# Patient Record
Sex: Female | Born: 1970 | Race: White | Hispanic: No | Marital: Married | Smoking: Former smoker
Health system: Southern US, Community
[De-identification: ages and names within clinical notes are randomized; demographics above are authoritative.]

## PROBLEM LIST (undated history)

## (undated) DIAGNOSIS — N926 Irregular menstruation, unspecified: Secondary | ICD-10-CM

## (undated) DIAGNOSIS — N61 Mastitis without abscess: Secondary | ICD-10-CM

## (undated) DIAGNOSIS — T4145XA Adverse effect of unspecified anesthetic, initial encounter: Secondary | ICD-10-CM

## (undated) DIAGNOSIS — M549 Dorsalgia, unspecified: Secondary | ICD-10-CM

## (undated) DIAGNOSIS — E739 Lactose intolerance, unspecified: Secondary | ICD-10-CM

## (undated) DIAGNOSIS — C801 Malignant (primary) neoplasm, unspecified: Secondary | ICD-10-CM

## (undated) DIAGNOSIS — O209 Hemorrhage in early pregnancy, unspecified: Secondary | ICD-10-CM

## (undated) DIAGNOSIS — R011 Cardiac murmur, unspecified: Secondary | ICD-10-CM

## (undated) DIAGNOSIS — Z22330 Carrier of Group B streptococcus: Secondary | ICD-10-CM

## (undated) DIAGNOSIS — Z803 Family history of malignant neoplasm of breast: Secondary | ICD-10-CM

## (undated) DIAGNOSIS — T7840XA Allergy, unspecified, initial encounter: Secondary | ICD-10-CM

## (undated) DIAGNOSIS — K589 Irritable bowel syndrome without diarrhea: Secondary | ICD-10-CM

## (undated) DIAGNOSIS — N809 Endometriosis, unspecified: Secondary | ICD-10-CM

## (undated) DIAGNOSIS — Z6791 Unspecified blood type, Rh negative: Secondary | ICD-10-CM

## (undated) DIAGNOSIS — Z8619 Personal history of other infectious and parasitic diseases: Secondary | ICD-10-CM

## (undated) DIAGNOSIS — M797 Fibromyalgia: Secondary | ICD-10-CM

## (undated) DIAGNOSIS — R600 Localized edema: Secondary | ICD-10-CM

## (undated) DIAGNOSIS — M26629 Arthralgia of temporomandibular joint, unspecified side: Secondary | ICD-10-CM

## (undated) DIAGNOSIS — Z8041 Family history of malignant neoplasm of ovary: Secondary | ICD-10-CM

## (undated) DIAGNOSIS — O09529 Supervision of elderly multigravida, unspecified trimester: Secondary | ICD-10-CM

## (undated) DIAGNOSIS — E785 Hyperlipidemia, unspecified: Secondary | ICD-10-CM

## (undated) DIAGNOSIS — G4730 Sleep apnea, unspecified: Secondary | ICD-10-CM

## (undated) DIAGNOSIS — R51 Headache: Secondary | ICD-10-CM

## (undated) DIAGNOSIS — M2550 Pain in unspecified joint: Secondary | ICD-10-CM

## (undated) DIAGNOSIS — I10 Essential (primary) hypertension: Secondary | ICD-10-CM

## (undated) DIAGNOSIS — J302 Other seasonal allergic rhinitis: Secondary | ICD-10-CM

## (undated) DIAGNOSIS — K900 Celiac disease: Secondary | ICD-10-CM

## (undated) DIAGNOSIS — R002 Palpitations: Secondary | ICD-10-CM

## (undated) DIAGNOSIS — H9190 Unspecified hearing loss, unspecified ear: Secondary | ICD-10-CM

## (undated) DIAGNOSIS — T8859XA Other complications of anesthesia, initial encounter: Secondary | ICD-10-CM

## (undated) DIAGNOSIS — Z8742 Personal history of other diseases of the female genital tract: Secondary | ICD-10-CM

## (undated) DIAGNOSIS — L920 Granuloma annulare: Secondary | ICD-10-CM

## (undated) DIAGNOSIS — Z789 Other specified health status: Secondary | ICD-10-CM

## (undated) DIAGNOSIS — O039 Complete or unspecified spontaneous abortion without complication: Secondary | ICD-10-CM

## (undated) DIAGNOSIS — D219 Benign neoplasm of connective and other soft tissue, unspecified: Secondary | ICD-10-CM

## (undated) DIAGNOSIS — E559 Vitamin D deficiency, unspecified: Secondary | ICD-10-CM

## (undated) DIAGNOSIS — B373 Candidiasis of vulva and vagina: Secondary | ICD-10-CM

## (undated) DIAGNOSIS — K219 Gastro-esophageal reflux disease without esophagitis: Secondary | ICD-10-CM

## (undated) DIAGNOSIS — O26899 Other specified pregnancy related conditions, unspecified trimester: Secondary | ICD-10-CM

## (undated) DIAGNOSIS — M79605 Pain in left leg: Secondary | ICD-10-CM

## (undated) DIAGNOSIS — Z9109 Other allergy status, other than to drugs and biological substances: Secondary | ICD-10-CM

## (undated) DIAGNOSIS — Z85820 Personal history of malignant melanoma of skin: Secondary | ICD-10-CM

## (undated) DIAGNOSIS — O99619 Diseases of the digestive system complicating pregnancy, unspecified trimester: Secondary | ICD-10-CM

## (undated) DIAGNOSIS — G8929 Other chronic pain: Secondary | ICD-10-CM

## (undated) DIAGNOSIS — K5900 Constipation, unspecified: Secondary | ICD-10-CM

## (undated) DIAGNOSIS — Z86718 Personal history of other venous thrombosis and embolism: Secondary | ICD-10-CM

## (undated) HISTORY — DX: Personal history of other diseases of the female genital tract: Z87.42

## (undated) HISTORY — DX: Sleep apnea, unspecified: G47.30

## (undated) HISTORY — DX: Localized edema: R60.0

## (undated) HISTORY — DX: Lactose intolerance, unspecified: E73.9

## (undated) HISTORY — DX: Mastitis without abscess: N61.0

## (undated) HISTORY — DX: Irregular menstruation, unspecified: N92.6

## (undated) HISTORY — DX: Essential (primary) hypertension: I10

## (undated) HISTORY — DX: Supervision of elderly multigravida, unspecified trimester: O09.529

## (undated) HISTORY — DX: Family history of malignant neoplasm of breast: Z80.3

## (undated) HISTORY — DX: Other specified pregnancy related conditions, unspecified trimester: O26.899

## (undated) HISTORY — DX: Personal history of other venous thrombosis and embolism: Z86.718

## (undated) HISTORY — DX: Constipation, unspecified: K59.00

## (undated) HISTORY — DX: Dorsalgia, unspecified: M54.9

## (undated) HISTORY — DX: Carrier of group B Streptococcus: Z22.330

## (undated) HISTORY — PX: REPLACEMENT TOTAL KNEE BILATERAL: SUR1225

## (undated) HISTORY — DX: Irritable bowel syndrome without diarrhea: K58.9

## (undated) HISTORY — DX: Vitamin D deficiency, unspecified: E55.9

## (undated) HISTORY — DX: Personal history of other infectious and parasitic diseases: Z86.19

## (undated) HISTORY — DX: Unspecified hearing loss, unspecified ear: H91.90

## (undated) HISTORY — DX: Diseases of the digestive system complicating pregnancy, unspecified trimester: O99.619

## (undated) HISTORY — DX: Personal history of malignant melanoma of skin: Z85.820

## (undated) HISTORY — DX: Other specified health status: Z78.9

## (undated) HISTORY — DX: Other chronic pain: G89.29

## (undated) HISTORY — DX: Malignant (primary) neoplasm, unspecified: C80.1

## (undated) HISTORY — DX: Allergy, unspecified, initial encounter: T78.40XA

## (undated) HISTORY — DX: Hyperlipidemia, unspecified: E78.5

## (undated) HISTORY — DX: Pain in unspecified joint: M25.50

## (undated) HISTORY — PX: REDUCTION MAMMAPLASTY: SUR839

## (undated) HISTORY — DX: Complete or unspecified spontaneous abortion without complication: O03.9

## (undated) HISTORY — DX: Family history of malignant neoplasm of ovary: Z80.41

## (undated) HISTORY — DX: Candidiasis of vulva and vagina: B37.3

## (undated) HISTORY — DX: Pain in left leg: M79.605

## (undated) HISTORY — DX: Granuloma annulare: L92.0

## (undated) HISTORY — DX: Palpitations: R00.2

## (undated) HISTORY — DX: Endometriosis, unspecified: N80.9

## (undated) HISTORY — PX: DILATION AND EVACUATION: SHX1459

## (undated) HISTORY — DX: Hemorrhage in early pregnancy, unspecified: O20.9

## (undated) HISTORY — DX: Benign neoplasm of connective and other soft tissue, unspecified: D21.9

## (undated) HISTORY — DX: Unspecified blood type, rh negative: Z67.91

## (undated) HISTORY — DX: Celiac disease: K90.0

---
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
## 1999-03-01 HISTORY — PX: OTHER SURGICAL HISTORY: SHX169

## 1999-05-07 ENCOUNTER — Other Ambulatory Visit: Admission: RE | Admit: 1999-05-07 | Discharge: 1999-05-07 | Payer: Self-pay | Admitting: Obstetrics and Gynecology

## 1999-11-13 ENCOUNTER — Inpatient Hospital Stay (HOSPITAL_COMMUNITY): Admission: AD | Admit: 1999-11-13 | Discharge: 1999-11-13 | Payer: Self-pay | Admitting: Obstetrics and Gynecology

## 1999-11-30 ENCOUNTER — Encounter: Payer: Self-pay | Admitting: Obstetrics and Gynecology

## 1999-11-30 ENCOUNTER — Inpatient Hospital Stay (HOSPITAL_COMMUNITY): Admission: RE | Admit: 1999-11-30 | Discharge: 1999-11-30 | Payer: Self-pay | Admitting: Obstetrics and Gynecology

## 1999-11-30 ENCOUNTER — Encounter (INDEPENDENT_AMBULATORY_CARE_PROVIDER_SITE_OTHER): Payer: Self-pay | Admitting: Specialist

## 1999-12-13 ENCOUNTER — Other Ambulatory Visit: Admission: RE | Admit: 1999-12-13 | Discharge: 1999-12-13 | Payer: Self-pay | Admitting: Obstetrics & Gynecology

## 1999-12-24 ENCOUNTER — Encounter: Admission: RE | Admit: 1999-12-24 | Discharge: 2000-01-03 | Payer: Self-pay | Admitting: Internal Medicine

## 2000-02-29 DIAGNOSIS — C801 Malignant (primary) neoplasm, unspecified: Secondary | ICD-10-CM

## 2000-02-29 HISTORY — PX: MELANOMA EXCISION: SHX5266

## 2000-02-29 HISTORY — PX: APPENDECTOMY: SHX54

## 2000-02-29 HISTORY — DX: Malignant (primary) neoplasm, unspecified: C80.1

## 2000-03-22 ENCOUNTER — Inpatient Hospital Stay (HOSPITAL_COMMUNITY): Admission: AD | Admit: 2000-03-22 | Discharge: 2000-03-22 | Payer: Self-pay | Admitting: Obstetrics and Gynecology

## 2000-06-22 ENCOUNTER — Encounter (INDEPENDENT_AMBULATORY_CARE_PROVIDER_SITE_OTHER): Payer: Self-pay

## 2000-06-22 ENCOUNTER — Inpatient Hospital Stay (HOSPITAL_COMMUNITY): Admission: AD | Admit: 2000-06-22 | Discharge: 2000-06-25 | Payer: Self-pay | Admitting: Obstetrics and Gynecology

## 2001-02-01 ENCOUNTER — Encounter: Payer: Self-pay | Admitting: Emergency Medicine

## 2001-02-01 ENCOUNTER — Encounter (INDEPENDENT_AMBULATORY_CARE_PROVIDER_SITE_OTHER): Payer: Self-pay | Admitting: Specialist

## 2001-02-02 ENCOUNTER — Inpatient Hospital Stay (HOSPITAL_COMMUNITY): Admission: EM | Admit: 2001-02-02 | Discharge: 2001-02-04 | Payer: Self-pay | Admitting: Emergency Medicine

## 2001-02-28 HISTORY — PX: OTHER SURGICAL HISTORY: SHX169

## 2001-07-31 ENCOUNTER — Other Ambulatory Visit: Admission: RE | Admit: 2001-07-31 | Discharge: 2001-07-31 | Payer: Self-pay | Admitting: Obstetrics and Gynecology

## 2002-02-28 HISTORY — PX: TENDON RELEASE: SHX230

## 2002-06-06 ENCOUNTER — Inpatient Hospital Stay (HOSPITAL_COMMUNITY): Admission: AD | Admit: 2002-06-06 | Discharge: 2002-06-06 | Payer: Self-pay | Admitting: Obstetrics and Gynecology

## 2002-06-17 ENCOUNTER — Inpatient Hospital Stay (HOSPITAL_COMMUNITY): Admission: AD | Admit: 2002-06-17 | Discharge: 2002-06-17 | Payer: Self-pay | Admitting: Obstetrics and Gynecology

## 2002-08-13 ENCOUNTER — Inpatient Hospital Stay (HOSPITAL_COMMUNITY): Admission: AD | Admit: 2002-08-13 | Discharge: 2002-08-16 | Payer: Self-pay | Admitting: Obstetrics and Gynecology

## 2002-09-27 ENCOUNTER — Other Ambulatory Visit: Admission: RE | Admit: 2002-09-27 | Discharge: 2002-09-27 | Payer: Self-pay | Admitting: Obstetrics and Gynecology

## 2003-01-14 ENCOUNTER — Encounter: Admission: RE | Admit: 2003-01-14 | Discharge: 2003-01-14 | Payer: Self-pay | Admitting: Chiropractic Medicine

## 2003-10-10 ENCOUNTER — Other Ambulatory Visit: Admission: RE | Admit: 2003-10-10 | Discharge: 2003-10-10 | Payer: Self-pay | Admitting: Obstetrics and Gynecology

## 2004-06-28 ENCOUNTER — Inpatient Hospital Stay (HOSPITAL_COMMUNITY): Admission: AD | Admit: 2004-06-28 | Discharge: 2004-06-28 | Payer: Self-pay | Admitting: Obstetrics and Gynecology

## 2004-07-27 DIAGNOSIS — B3731 Acute candidiasis of vulva and vagina: Secondary | ICD-10-CM

## 2004-07-27 DIAGNOSIS — B373 Candidiasis of vulva and vagina: Secondary | ICD-10-CM

## 2004-07-27 HISTORY — DX: Acute candidiasis of vulva and vagina: B37.31

## 2004-07-27 HISTORY — DX: Candidiasis of vulva and vagina: B37.3

## 2004-10-11 ENCOUNTER — Other Ambulatory Visit: Admission: RE | Admit: 2004-10-11 | Discharge: 2004-10-11 | Payer: Self-pay | Admitting: Obstetrics and Gynecology

## 2005-02-24 ENCOUNTER — Encounter: Admission: RE | Admit: 2005-02-24 | Discharge: 2005-02-24 | Payer: Self-pay | Admitting: Internal Medicine

## 2005-11-11 ENCOUNTER — Other Ambulatory Visit: Admission: RE | Admit: 2005-11-11 | Discharge: 2005-11-11 | Payer: Self-pay | Admitting: Obstetrics and Gynecology

## 2006-02-28 DIAGNOSIS — O209 Hemorrhage in early pregnancy, unspecified: Secondary | ICD-10-CM

## 2006-02-28 HISTORY — DX: Hemorrhage in early pregnancy, unspecified: O20.9

## 2006-03-10 ENCOUNTER — Inpatient Hospital Stay (HOSPITAL_COMMUNITY): Admission: AD | Admit: 2006-03-10 | Discharge: 2006-03-10 | Payer: Self-pay | Admitting: Obstetrics and Gynecology

## 2006-03-31 DIAGNOSIS — K5900 Constipation, unspecified: Secondary | ICD-10-CM

## 2006-03-31 HISTORY — DX: Constipation, unspecified: K59.00

## 2006-07-19 ENCOUNTER — Encounter (INDEPENDENT_AMBULATORY_CARE_PROVIDER_SITE_OTHER): Payer: Self-pay | Admitting: Obstetrics and Gynecology

## 2006-07-19 ENCOUNTER — Ambulatory Visit (HOSPITAL_COMMUNITY): Admission: RE | Admit: 2006-07-19 | Discharge: 2006-07-19 | Payer: Self-pay | Admitting: Obstetrics and Gynecology

## 2006-10-30 DIAGNOSIS — Z8742 Personal history of other diseases of the female genital tract: Secondary | ICD-10-CM

## 2006-10-30 HISTORY — DX: Personal history of other diseases of the female genital tract: Z87.42

## 2007-05-14 ENCOUNTER — Inpatient Hospital Stay (HOSPITAL_COMMUNITY): Admission: AD | Admit: 2007-05-14 | Discharge: 2007-05-14 | Payer: Self-pay | Admitting: Obstetrics and Gynecology

## 2007-06-21 LAB — CONVERTED CEMR LAB: Pap Smear: NORMAL

## 2007-07-18 ENCOUNTER — Inpatient Hospital Stay (HOSPITAL_COMMUNITY): Admission: AD | Admit: 2007-07-18 | Discharge: 2007-07-18 | Payer: Self-pay | Admitting: Obstetrics and Gynecology

## 2007-07-20 ENCOUNTER — Inpatient Hospital Stay (HOSPITAL_COMMUNITY): Admission: AD | Admit: 2007-07-20 | Discharge: 2007-07-23 | Payer: Self-pay | Admitting: Obstetrics and Gynecology

## 2007-07-21 ENCOUNTER — Encounter (INDEPENDENT_AMBULATORY_CARE_PROVIDER_SITE_OTHER): Payer: Self-pay | Admitting: Obstetrics and Gynecology

## 2007-07-28 ENCOUNTER — Inpatient Hospital Stay (HOSPITAL_COMMUNITY): Admission: AD | Admit: 2007-07-28 | Discharge: 2007-07-28 | Payer: Self-pay | Admitting: Obstetrics and Gynecology

## 2007-08-03 ENCOUNTER — Encounter: Admission: RE | Admit: 2007-08-03 | Discharge: 2007-08-29 | Payer: Self-pay | Admitting: Obstetrics and Gynecology

## 2007-09-03 ENCOUNTER — Ambulatory Visit: Admission: RE | Admit: 2007-09-03 | Discharge: 2007-09-03 | Payer: Self-pay | Admitting: Obstetrics and Gynecology

## 2007-10-04 DIAGNOSIS — N61 Mastitis without abscess: Secondary | ICD-10-CM

## 2007-10-04 HISTORY — DX: Mastitis without abscess: N61.0

## 2008-02-29 HISTORY — PX: ENDOMETRIAL ABLATION: SHX621

## 2008-05-07 ENCOUNTER — Ambulatory Visit: Payer: Self-pay | Admitting: *Deleted

## 2008-05-07 DIAGNOSIS — R51 Headache: Secondary | ICD-10-CM

## 2008-05-07 DIAGNOSIS — R519 Headache, unspecified: Secondary | ICD-10-CM | POA: Insufficient documentation

## 2008-05-07 DIAGNOSIS — I10 Essential (primary) hypertension: Secondary | ICD-10-CM

## 2008-05-07 DIAGNOSIS — C439 Malignant melanoma of skin, unspecified: Secondary | ICD-10-CM | POA: Insufficient documentation

## 2008-05-07 DIAGNOSIS — K649 Unspecified hemorrhoids: Secondary | ICD-10-CM | POA: Insufficient documentation

## 2008-05-08 DIAGNOSIS — J309 Allergic rhinitis, unspecified: Secondary | ICD-10-CM | POA: Insufficient documentation

## 2008-06-06 ENCOUNTER — Encounter: Payer: Self-pay | Admitting: Internal Medicine

## 2008-06-06 LAB — CONVERTED CEMR LAB
Albumin: 4.6 g/dL (ref 3.5–5.2)
Alkaline Phosphatase: 58 units/L (ref 39–117)
BUN: 12 mg/dL (ref 6–23)
CO2: 23 meq/L (ref 19–32)
Calcium: 9.1 mg/dL (ref 8.4–10.5)
Chloride: 106 meq/L (ref 96–112)
Glucose, Bld: 83 mg/dL (ref 70–99)
HDL: 55 mg/dL (ref >39)
Hemoglobin: 13.5 g/dL (ref 12.0–15.0)
LDL Cholesterol: 80 mg/dL (ref 0–99)
Potassium: 4.7 meq/L (ref 3.5–5.3)
RBC: 4.53 M/uL (ref 3.87–5.11)
Sodium: 139 meq/L (ref 135–145)
Total Protein: 6.6 g/dL (ref 6.0–8.3)
Triglycerides: 61 mg/dL (ref <150)
WBC: 5.6 10*3/uL (ref 4.0–10.5)

## 2008-06-09 ENCOUNTER — Encounter: Payer: Self-pay | Admitting: Internal Medicine

## 2008-07-23 ENCOUNTER — Ambulatory Visit: Payer: Self-pay | Admitting: Family Medicine

## 2008-09-18 ENCOUNTER — Telehealth (INDEPENDENT_AMBULATORY_CARE_PROVIDER_SITE_OTHER): Payer: Self-pay | Admitting: *Deleted

## 2008-09-19 ENCOUNTER — Ambulatory Visit: Payer: Self-pay | Admitting: Internal Medicine

## 2008-09-19 DIAGNOSIS — IMO0002 Reserved for concepts with insufficient information to code with codable children: Secondary | ICD-10-CM

## 2008-09-19 DIAGNOSIS — R209 Unspecified disturbances of skin sensation: Secondary | ICD-10-CM | POA: Insufficient documentation

## 2008-09-22 ENCOUNTER — Encounter (INDEPENDENT_AMBULATORY_CARE_PROVIDER_SITE_OTHER): Payer: Self-pay | Admitting: *Deleted

## 2008-09-23 ENCOUNTER — Telehealth (INDEPENDENT_AMBULATORY_CARE_PROVIDER_SITE_OTHER): Payer: Self-pay | Admitting: *Deleted

## 2008-09-29 ENCOUNTER — Telehealth (INDEPENDENT_AMBULATORY_CARE_PROVIDER_SITE_OTHER): Payer: Self-pay | Admitting: *Deleted

## 2008-10-15 ENCOUNTER — Telehealth (INDEPENDENT_AMBULATORY_CARE_PROVIDER_SITE_OTHER): Payer: Self-pay | Admitting: *Deleted

## 2008-11-05 ENCOUNTER — Encounter: Payer: Self-pay | Admitting: Family Medicine

## 2008-11-19 ENCOUNTER — Ambulatory Visit: Payer: Self-pay | Admitting: Family Medicine

## 2008-11-19 DIAGNOSIS — M2550 Pain in unspecified joint: Secondary | ICD-10-CM | POA: Insufficient documentation

## 2008-11-19 DIAGNOSIS — R252 Cramp and spasm: Secondary | ICD-10-CM

## 2008-11-20 ENCOUNTER — Telehealth (INDEPENDENT_AMBULATORY_CARE_PROVIDER_SITE_OTHER): Payer: Self-pay | Admitting: *Deleted

## 2008-11-20 LAB — CONVERTED CEMR LAB
Anti Nuclear Antibody(ANA): NEGATIVE
Rhuematoid fact SerPl-aCnc: 26.2 intl units/mL — ABNORMAL HIGH (ref 0.0–20.0)

## 2008-12-01 ENCOUNTER — Encounter: Payer: Self-pay | Admitting: Family Medicine

## 2009-01-28 ENCOUNTER — Ambulatory Visit (HOSPITAL_COMMUNITY): Admission: RE | Admit: 2009-01-28 | Discharge: 2009-01-28 | Payer: Self-pay | Admitting: Obstetrics and Gynecology

## 2009-01-28 HISTORY — PX: HYSTEROSCOPY W/ ENDOMETRIAL ABLATION: SUR665

## 2009-01-28 HISTORY — PX: OTHER SURGICAL HISTORY: SHX169

## 2009-02-17 ENCOUNTER — Encounter: Admission: RE | Admit: 2009-02-17 | Discharge: 2009-02-17 | Payer: Self-pay | Admitting: Obstetrics and Gynecology

## 2009-12-08 ENCOUNTER — Ambulatory Visit: Payer: Self-pay | Admitting: Family Medicine

## 2009-12-08 DIAGNOSIS — R42 Dizziness and giddiness: Secondary | ICD-10-CM

## 2010-02-25 ENCOUNTER — Ambulatory Visit: Payer: Self-pay | Admitting: Family Medicine

## 2010-02-25 ENCOUNTER — Encounter: Payer: Self-pay | Admitting: Family Medicine

## 2010-02-25 DIAGNOSIS — J019 Acute sinusitis, unspecified: Secondary | ICD-10-CM

## 2010-02-26 LAB — CONVERTED CEMR LAB
Albumin: 4.2 g/dL (ref 3.5–5.2)
BUN: 10 mg/dL (ref 6–23)
Basophils Absolute: 0 10*3/uL (ref 0.0–0.1)
CO2: 29 meq/L (ref 19–32)
Cholesterol: 183 mg/dL (ref 0–200)
Eosinophils Absolute: 0.2 10*3/uL (ref 0.0–0.7)
Glucose, Bld: 85 mg/dL (ref 70–99)
HCT: 41.2 % (ref 36.0–46.0)
HDL: 46.1 mg/dL (ref >39.00)
Hemoglobin: 14.3 g/dL (ref 12.0–15.0)
Lymphs Abs: 2.1 10*3/uL (ref 0.7–4.0)
MCHC: 34.7 g/dL (ref 30.0–36.0)
Monocytes Absolute: 0.4 10*3/uL (ref 0.1–1.0)
Neutro Abs: 3.8 10*3/uL (ref 1.4–7.7)
Platelets: 347 10*3/uL (ref 150.0–400.0)
Potassium: 5.3 meq/L — ABNORMAL HIGH (ref 3.5–5.1)
RDW: 13.5 % (ref 11.5–14.6)
Sodium: 136 meq/L (ref 135–145)
TSH: 1.32 microintl units/mL (ref 0.35–5.50)
Total Bilirubin: 0.7 mg/dL (ref 0.3–1.2)
VLDL: 25.4 mg/dL (ref 0.0–40.0)
Vit D, 25-Hydroxy: 31 ng/mL (ref 30–89)

## 2010-03-15 ENCOUNTER — Encounter
Admission: RE | Admit: 2010-03-15 | Discharge: 2010-03-15 | Payer: Self-pay | Source: Home / Self Care | Attending: Family Medicine | Admitting: Family Medicine

## 2010-03-15 ENCOUNTER — Telehealth: Payer: Self-pay | Admitting: Family Medicine

## 2010-03-19 ENCOUNTER — Telehealth: Payer: Self-pay | Admitting: Family Medicine

## 2010-03-30 NOTE — Assessment & Plan Note (Signed)
Summary: FOR DIZZINESS//PH   Vital Signs:  Patient profile:   40 year old female Weight:      192 pounds BMI:     33.08 Temp:     98.6 degrees F oral Pulse (ortho):   82 / minute BP standing:   126 / 80  Vitals Entered By: Doristine Devoid CMA (December 08, 2009 11:00 AM) CC: sinus congestion and L ear discomfort   Serial Vital Signs/Assessments:  Time      Position  BP       Pulse  Resp  Temp     By           Lying RA  124/80   86                    Chemira Jones CMA           Sitting   122/80   70                    Chemira Jones CMA           Standing  126/80   82                    Chemira Jones CMA   History of Present Illness: 40 yo woman here today for dizziness.  pt reports feeling of spinning for  ~1 week.  most noticeable 'with a lay down and a twist'.  improved w/ lying still.  pt feels sxs are progressing.  pt became dizzy watching daughter jumping on trampoline.  has also had back to back URIs.  now w/ some L ear discomfort.  + facial pressure and nasal congestion.  denies HA but has pressure.  denies nausea, vomiting, focal weakness  Current Medications (verified): 1)  Allegra 180 Mg Tabs (Fexofenadine Hcl) .[name].. 1 By Mouth Once Daily 2)  Complete Womens  Tabs (Multiple Vitamins-Minerals) .... Take 1 Tablet By Mouth Once A Day 3)  Fluticasone Propionate 50 Mcg/act  Susp (Fluticasone Propionate) .... 2 Sprays Each Nostril Once Daily 4)  Allergy Injections .[name].. 1 Injection 2 X Weekly 5)  Msm& Glucosamine .[name].. 1 By Mouth Once Daily 6)  Nasal Antihistimine .... As Needed 7)  Bpc (? Name) .... As Directed 8)  Meclizine Hcl 25 Mg Tabs (Meclizine Hcl) .[name].. 1 Tab By Mouth 4x/day As Needed For Dizziness  Allergies (verified): No Known Drug Allergies  Review of Systems      See HPI  Physical Exam  General:  well-nourished,in no acute distress; alert,appropriate and cooperative throughout examination Head:  no TTP over frontal and maxillary sinuses Eyes:  No corneal or  conjunctival inflammation noted. EOMI. Perrla. Ears:  R ear normal and L ear normal.   Nose:  edematous turbinates bilaterally Mouth:  Oral mucosa and oropharynx without lesions or exudates.  Tongue w/o deviation  Lungs:  Normal respiratory effort, chest expands symmetrically. Lungs are clear to auscultation, no crackles or wheezes. Heart:  normal rate, regular rhythm Pulses:  +2 carotid, radial, DP Neurologic:  alert & oriented X3, cranial nerves II-XII intact, strength normal in all extremities, sensation intact to light touch, and gait normal- if head remains stationary, dizziness occurs w/ moving head rapidly.  DTRs symmetrical and normal.     Impression & Recommendations:  Problem # 1:  DIZZINESS (ICD-780.4) Assessment New sxs consistent w/ BPV.  no red flags on hx or PE.  start Meclizine as needed.  reviewed supportive care  and red flags that should prompt return.  Pt expresses understanding and is in agreement w/ this plan. Her updated medication list for this problem includes:    Allegra 180 Mg Tabs (Fexofenadine hcl) .[name]... 1 by mouth once daily    Meclizine Hcl 25 Mg Tabs (Meclizine hcl) .[name]... 1 tab by mouth 4x/day as needed for dizziness  Complete Medication List: 1)  Allegra 180 Mg Tabs (Fexofenadine hcl) .[name].. 1 by mouth once daily 2)  Complete Womens Tabs (Multiple vitamins-minerals) .... Take 1 tablet by mouth once a day 3)  Fluticasone Propionate 50 Mcg/act Susp (Fluticasone propionate) .... 2 sprays each nostril once daily 4)  Allergy Injections  .[name].. 1 injection 2 x weekly 5)  Msm& Glucosamine  .Marland KitchenMarland Kitchen. 1 by mouth once daily 6)  Nasal Antihistimine  .... As needed 7)  Bpc (? Name)  .... As directed 8)  Meclizine Hcl 25 Mg Tabs (Meclizine hcl) .[name].. 1 tab by mouth 4x/day as needed for dizziness  Patient Instructions: 1)  This is consistent w/ BPV- Benign Positional Vertigo 2)  Take the Meclizine regularly for the next 3 days and then as needed for dizziness 3)  Drink plenty  of fluids 4)  Use the nasal spray regularly to decrease head congestion 5)  Call with any questions or concerns 6)  Hang in there!!! Prescriptions: MECLIZINE HCL 25 MG TABS (MECLIZINE HCL) 1 tab by mouth 4x/day as needed for dizziness  #60 x 1   Entered and Authorized by:   Neena Rhymes MD   Signed by:   Neena Rhymes MD on 12/08/2009   Method used:   Electronically to        [facility]* (retail)       [address] [place], [place]  [address]       Ph: [phone] or [phone]       Fax: [phone]   RxID:   [id number]

## 2010-04-01 NOTE — Assessment & Plan Note (Signed)
Summary: CPX AND FASTING LABS---HAVING HEADACHES--BP IN PHARMACY--"KIN...   Vital Signs:  Patient profile:   40 year old female Height:      64 inches Weight:      200 pounds BMI:     34.45 Pulse rate:   82 / minute BP sitting:   130 / 84  (left arm)  Vitals Entered By: Doristine Devoid CMA (February 25, 2010 10:07 AM) CC: CPX AND LABS along w/ sinus pain and congestion    History of Present Illness: 40 yo woman here today for CPE.  GYN- Dr Pennie Rushing.  UTD on pap, due for mammo  sinus pain- HA started 'a couple of weeks ago'.  will improve w/ OTC pain meds.  + ear fullness.  + facial pain/pressure including tooth pain.  no fevers.  + sick contacts.  Preventive Screening-Counseling & Management  Alcohol-Tobacco     Alcohol drinks/day: <1     Smoking Status: never  Caffeine-Diet-Exercise     Does Patient Exercise: no      Sexual History:  currently monogamous.        Drug Use:  never.    Current Medications (verified): 1)  Zyrtec Allergy 10 Mg Tabs (Cetirizine Hcl) .... Take One Tablet Daily 2)  Complete Womens  Tabs (Multiple Vitamins-Minerals) .... Take 1 Tablet By Mouth Once A Day 3)  Fluticasone Propionate 50 Mcg/act  Susp (Fluticasone Propionate) .... 2 Sprays Each Nostril Once Daily 4)  Allergy Injections .[name].. 1 Injection 1 X Weekly 5)  Msm& Glucosamine .[name].. 1 By Mouth Once Daily 6)  Nasal Antihistimine .... As Needed 7)  Glucosamine Msm Complex  Tabs (Glucos-Msm-C-Mn-Ginger-Willow) .... Take Once Daily 8)  Diflucan 150 Mg Tabs (Fluconazole) .... Once Daily.  May Repeat in 3 Days If Sxs Persist  Allergies (verified): No Known Drug Allergies  Past History:  Past medical, surgical, family and social histories (including risk factors) reviewed, and no changes noted (except as noted below).  Past Medical History: Reviewed history from 05/07/2008 and no changes required. Current Problems:  HYPERTENSION while preganant - then resolved seasonal allergies HEADACHE  (ICD-784.0) - not a current problem MELANOMA (ICD-172.9) hemorrhoids - occured during pregnancy and has rare flares  Past Surgical History: appendix 2002 childbirth 2002/2004/2009 BTL 2010 uterine ablation 2010  Family History: Reviewed history and no changes required. colon cancer- none breast cancer- mom, MGM ovarian cancer- maternal aunt  Social History: Reviewed history from 07/23/2008 and no changes required. married, 3 children [place]ual [place]:  currently monogamous Drug Use:  never  Review of Systems       The patient complains of headaches.  The patient denies anorexia, fever, weight loss, weight gain, vision loss, decreased hearing, hoarseness, chest pain, syncope, dyspnea on exertion, peripheral edema, prolonged cough, abdominal pain, melena, hematochezia, severe indigestion/heartburn, hematuria, suspicious skin lesions, depression, abnormal bleeding, enlarged lymph nodes, and breast masses.    Physical Exam  General:  well-nourished,in no acute distress; alert,appropriate and cooperative throughout examination Head:  + TTP over frontal and maxillary sinuses Eyes:  No corneal or conjunctival inflammation noted. EOMI. Perrla.  fundi WNL Ears:  External ear exam shows no significant lesions or deformities.  Otoscopic examination reveals clear canals, tympanic membranes are intact bilaterally without bulging, retraction, inflammation or discharge. Hearing is grossly normal bilaterally. Nose:  edematous turbinates bilaterally Mouth:  Oral mucosa and oropharynx without lesions or exudates.  Teeth in good repair. Neck:  No deformities, masses, or tenderness noted. Breasts:  gyn  Lungs:  Normal respiratory effort, chest expands symmetrically. Lungs are clear to auscultation, no crackles or wheezes. Heart:  normal rate, regular rhythm Abdomen:  Bowel sounds positive,abdomen soft and non-tender without masses, organomegaly or hernias  noted. Genitalia:  gyn Pulses:  +2 carotid, radial, DP Extremities:  No clubbing, cyanosis, edema, or deformity noted with normal full range of motion of all joints.   Neurologic:  No cranial nerve deficits noted. Station and gait are normal. Plantar reflexes are down-going bilaterally. DTRs are symmetrical throughout. Sensory, motor and coordinative functions appear intact. Skin:  Intact without suspicious lesions or rashes Cervical Nodes:  No lymphadenopathy noted Axillary Nodes:  No palpable lymphadenopathy Psych:  memory intact for recent and remote, normally interactive, good eye contact, and not anxious appearing.     Impression & Recommendations:  Problem # 1:  PHYSICAL EXAMINATION (ICD-V70.0) Assessment New pt's PE WNL w/ exception of sinus infxn (see below).  check labs.  encouraged her to schedule mammogram.  anticipatory guidance provided. Orders: Venipuncture (04540) T-Vitamin D (25-Hydroxy) ([id number]) TLB-Lipid Panel (80061-LIPID) TLB-BMP (Basic Metabolic Panel-BMET) (80048-METABOL) TLB-CBC Platelet - w/Differential (85025-CBCD) TLB-Hepatic/Liver Function Pnl (80076-HEPATIC) TLB-TSH (Thyroid Stimulating Hormone) (84443-TSH)  Problem # 2:  SINUSITIS - ACUTE-NOS (ICD-461.9) Assessment: New start amox.  diflucan given in case of yeast.  reviewed supportive care and red flags that should prompt return.  Pt expresses understanding and is in agreement w/ this plan. Her updated medication list for this problem includes:    Fluticasone Propionate 50 Mcg/act Susp (Fluticasone propionate) .[name]... 2 sprays each nostril once daily  Complete Medication List: 1)  Zyrtec Allergy 10 Mg Tabs (Cetirizine hcl) .... Take one tablet daily 2)  Complete Womens Tabs (Multiple vitamins-minerals) .... Take 1 tablet by mouth once a day 3)  Fluticasone Propionate 50 Mcg/act Susp (Fluticasone propionate) .... 2 sprays each nostril once daily 4)  Allergy Injections  .[name].. 1 injection 1 x weekly 5)   Msm& Glucosamine  .Marland KitchenMarland Kitchen. 1 by mouth once daily 6)  Nasal Antihistimine  .... As needed 7)  Glucosamine Msm Complex Tabs (Glucos-msm-c-mn-ginger-willow) .... Take once daily 8)  Diflucan 150 Mg Tabs (Fluconazole) .... Once daily.  may repeat in 3 days if sxs persist  Patient Instructions: 1)  Follow up in 1 year or as needed 2)  Your exam looks good- keep up the good work on diet and exercise 3)  We'll notify you of your lab results 4)  Take the Amoxicillin as directed for the sinus infection- take w/ food to avoid upset stomach 5)  Drink plenty of fluids 6)  Mucinex as needed to thin your congestion 7)  Call with any questions or concerns 8)  Hang in there!! 9)  Happy New Year!!! Prescriptions: DIFLUCAN 150 MG TABS (FLUCONAZOLE) once daily.  may repeat in 3 days if sxs persist  #2 x 0   Entered and Authorized by:   Neena Rhymes MD   Signed by:   Neena Rhymes MD on 02/25/2010   Method used:   Electronically to        [facility]* (retail)       [address] [place], [place]  [address]       Ph: [phone] or [phone]       Fax: [phone]   RxID:   [id number] AMOXICILLIN 500 MG TABS (AMOXICILLIN) 2 tabs by mouth two times a day x10  days  #40 x 0   Entered and Authorized by:   Neena Rhymes MD   Signed by:   Neena Rhymes MD on 02/25/2010   Method used:   Electronically to        [facility]* (retail)       [address] [place], [place]  [address]       Ph: [phone] or [phone]       Fax: [phone]   RxID:   [id number]    Orders Added: 1)  Venipuncture [36415] 2)  T-Vitamin D (25-Hydroxy) [phone] 3)  TLB-Lipid Panel [80061-LIPID] 4)  TLB-BMP (Basic Metabolic Panel-BMET) [80048-METABOL] 5)  TLB-CBC Platelet - w/Differential [85025-CBCD] 6)  TLB-Hepatic/Liver Function Pnl [80076-HEPATIC] 7)  TLB-TSH  (Thyroid Stimulating Hormone) [84443-TSH] 8)  Est. Patient 40-64 years [99396] 9)  Est. Patient Level III [32951]

## 2010-04-01 NOTE — Progress Notes (Signed)
Summary: still no better  Phone Note Call from Patient Call back at [phone]   Summary of Call: Pt still c/o ears popping, facial and eye pressure. Pt [facility]....Marland KitchenMarland KitchenFelecia Deloach CMA  March 15, 2010 4:38 PM    Follow-up for Phone Call        if no improvement w/ med switch will need ENT Follow-up by: Neena Rhymes MD,  March 15, 2010 4:46 PM  Additional Follow-up for Phone Call Additional follow up Details #1::        Discuss with patient, Rx sent to pharmacy...Marland KitchenMarland KitchenFelecia Deloach CMA  March 15, 2010 5:00 PM     New/Updated Medications: CLARITHROMYCIN 500 MG XR24H-TAB (CLARITHROMYCIN) 2 tabs daily x10 days Prescriptions: CLARITHROMYCIN 500 MG XR24H-TAB (CLARITHROMYCIN) 2 tabs daily x10 days  #20 x 0   Entered and Authorized by:   Neena Rhymes MD   Signed by:   Neena Rhymes MD on 03/15/2010   Method used:   Electronically to        [facility]* (retail)       [address] [place], [place]  [address]       Ph: [phone] or [phone]       Fax: [phone]   RxID:   [id number]

## 2010-04-01 NOTE — Progress Notes (Signed)
Summary: HA worse  Phone Note Call from Patient Call back at Home Phone [phone] Call back at [phone]   Caller: Patient Summary of Call: Pt called and states that she has been on the new ATB for 3 days. She states that her HA's are getting worse. I see in the last phone note, if she did not get better w/ this med change you were going to refer to ENT. Do you want to refer her now?  Initial call taken by: Army Fossa CMA,  March 19, 2010 9:37 AM  Follow-up for Phone Call        we can refer to ENT but she also needs to give the meds more time- she has only taken 3 of the 10 days of pills.  please also make sure she is taking tylenol or ibuprofen for pain relief, mucinex to thin her congestion, and drinking plenty of fluids. Follow-up by: Neena Rhymes MD,  March 19, 2010 9:45 AM  Additional Follow-up for Phone Call Additional follow up Details #1::        I spoke w/ pt and she would like for me to go ahead and set up a ENT appt. She states she is taking ibuprofen and mucinex. Army Fossa CMA  March 19, 2010 9:53 AM

## 2010-04-06 ENCOUNTER — Encounter: Payer: Self-pay | Admitting: Family Medicine

## 2010-04-21 NOTE — Consult Note (Signed)
Summary: [facility]   [facility]   Imported By: Maryln Gottron 04/12/2010 15:11:29  _____________________________________________________________________  External Attachment:    Type:   Image     Comment:   External Document

## 2010-06-01 LAB — CBC
MCHC: 34.2 g/dL (ref 30.0–36.0)
RBC: 4.22 MIL/uL (ref 3.87–5.11)
WBC: 5.8 10*3/uL (ref 4.0–10.5)

## 2010-06-01 LAB — PREGNANCY, URINE: Preg Test, Ur: NEGATIVE

## 2010-07-13 NOTE — H&P (Signed)
[name], [name]               ACCOUNT NO.:  [id number]   MEDICAL RECORD NO.:  [id number]          PATIENT TYPE:  AMB   LOCATION:  SDC                           FACILITY:  [facility]   PHYSICIAN:  Hal Morales, M.D.DATE OF BIRTH:  17-Jan-1971   DATE OF ADMISSION:  07/19/2006  DATE OF DISCHARGE:                              HISTORY & PHYSICAL   HISTORY OF PRESENT ILLNESS:  The patient is a 40 year old white married  female, para 2-0-2-2 who presents with her fourth pregnancy for  dilatation and evacuation because of incomplete spontaneous abortion.  The patient's last menstrual period was on February 18, 2007.  She was  seen at [facility] in January 2008 with first trimester  bleeding.  At that time she was noted to have B negative blood and was  treated with RhoGAM.  She also had followup of her HCG levels.  On  March 31, 2006, the patient had an ultrasound showing 5 week 6 day  intrauterine yolk sac with bradycardia.  The patient at that time was  experiencing no bleeding or pain.  On April 07, 2006, she had an  ultrasound showing a 6 week 2 day intrauterine pregnancy nonviable  pregnancy with a subchorionic hemorrhage.  At that time the patient was  given options to undergo D&E, expectant management, or Cytotec  management.  She elected for observation only and has had regular HCGs,  all of which showed significant decrease.  Her HCG started at around  60,000, and over the last 3-1/2 months has decreased to a level of 43.5.  In the last several weeks, however, the decline has been extremely slow,  and the patient has continued to have some spotting over this 8-month  period.  At this time, she feels she wishes to proceed with further  evaluation for completion of her incomplete spontaneous abortion.  An  ultrasound performed in our office on Jul 17, 2006, showed a 1.47 cm  mixed echogenicity of debris from hypoechoic to anechoic areas  consistent with retained  products of conception.  The ovaries were  normal bilaterally.   OBSTETRICAL HISTORY:  The patient had a normal spontaneous vaginal  delivery in 2002 of a female infant weighing 8 pounds 2 ounces.  In 2004,  the patient had a normal spontaneous vaginal delivery of a female  infant.  She has since that time had a spontaneous abortion which  resolved itself spontaneously over a very short period of time, less  than a month.   PAST MEDICAL HISTORY:  Significant for:  1. Borderline hypertension, never requiring medication.  2. History of chronic back pain.   GYN HISTORY:  Significant for a less than 2 cm fibroid and a cervical  polyp removed in 2001.   FAMILY HISTORY:  Significant for mother diagnosed with breast cancer in  2001, chronic hypertension, myocardial infarction.   PAST SURGICAL HISTORY:  Significant for a melanoma removed in 2003.   CURRENT MEDICATIONS:  Prenatal vitamins, Zyrtec, Rhinocort, and Astelin.   DRUG SENSITIVITIES:  None known.   REVIEW OF SYSTEMS:  Significant for continued bleeding  without any  significant abdominal pain.   PHYSICAL EXAMINATION:  GENERAL:  Well-developed black female in no acute  distress.  VITAL SIGNS:  Temperature 99.1, pulse 70, blood pressure 100/70.  LUNGS:  Clear.  HEART:  Regular rate and rhythm.  ABDOMEN:  Soft without mass or organomegaly.  EXTREMITIES:  No clubbing, cyanosis, or edema.  PELVIC:  EG/BUS within normal limits.  Vagina is rugae.  Cervix is  nontender.  Uterus is upper limits of normal size, posterior.  Adnexa:  No masses.  Rectovaginal: No masses.   IMPRESSION:  1. Incomplete spontaneous abortion.  2. Prolonged progression toward spontaneous resolution.  3. Suggestion of retained products of conception on ultrasound.   DISPOSITION:  Suction dilatation and evacuation under ultrasound  guidance is recommended and accepted.  The risks of anesthesia,  bleeding, infection, damage to adjacent organs, and uterine  perforation  are all explained to the patient.  She wishes to proceed.  This will be  done at [facility] on Jul 19, 2006.      Hal Morales, M.D.  Electronically Signed     VPH/MEDQ  D:  07/18/2006  T:  07/18/2006  Job:  [id number]

## 2010-07-13 NOTE — Op Note (Signed)
[name], [name]               ACCOUNT NO.:  [id number]   MEDICAL RECORD NO.:  [id number]          PATIENT TYPE:  AMB   LOCATION:  SDC                           FACILITY:  [facility]   PHYSICIAN:  Hal Morales, M.D.DATE OF BIRTH:  24-May-1970   DATE OF PROCEDURE:  07/19/2006  DATE OF DISCHARGE:                               OPERATIVE REPORT   PREOPERATIVE DIAGNOSIS:  Incomplete spontaneous abortion   POSTOPERATIVE DIAGNOSIS:  Incomplete spontaneous abortion   OPERATION:  Suction dilatation and evacuation, sharp curettage of the  uterus, ultrasound evaluation of the uterus.   SURGEON:  Hal Morales, M.D.   ANESTHESIA:  Monitored anesthesia care and local.   ESTIMATED BLOOD LOSS:  Less than 10 mL.   COMPLICATIONS:  None.   FINDINGS:  There was a moderate amount of products of conception  obtained at the time of D&E.   SPECIMENS TO PATHOLOGY:  Products of conception.   PROCEDURE:  The patient was taken to the operating room after  appropriate identification and placed on the operating table.  After  placement of equipment for monitored anesthesia care, she was placed in  the lithotomy position.  The perineum and vagina were prepped with  multiple layers of Betadine, and a red Robinson catheter used to ensure  the bladder was empty.  The perineum was draped as a sterile field.  A  Graves speculum was placed in the vagina and a paracervical block  achieved with a total of 8 mL of 2% Xylocaine at 5 and 7 o'clock  positions.  A single-tooth tenaculum was placed in the cervix.  The  cervix was noted to be dilated to accommodate a #29 dilator.  A #8  suction catheter was then used to suction evacuate all quadrants of the  uterus.  Sharp curette was then used to remove the remaining products of  conception.  Ultrasound was then used to evaluate the endometrial cavity  with a thin endometrial stripe and no further evidence of endometrial  contents.  Hemostasis was noted to be  adequate, and all instruments were  removed from the vagina.  The patient was taken from the operating room  to the recovery room in satisfactory condition, having tolerated the  procedure well with sponge and instrument counts correct.  The patient  received Ancef 2 grams IV preoperatively.   DISCHARGE INSTRUCTIONS:  Printed instructions from the [facility]  for [facility].   DISCHARGE MEDICATIONS:  1. Ibuprofen 600 mg p.o. q.6h. p.r.n. pain.  2. Doxycycline 100 mg p.o. b.i.d. for 5 days.  3. Methergine 0.2 mg p.o. q.6h. for 8 doses.  4. Blood type B negative.  The patient received RhoGAM in February.      She is to undergo an antibody screen.  If that antibody screen is      negative, she will receive another dose of RhoGAM today.   FOLLOW-UP:  The patient has an appointment on August 09, 2006 at 4:30 p.m.  for follow-up.      Hal Morales, M.D.  Electronically Signed     VPH/MEDQ  D:  07/19/2006  T:  07/19/2006  Job:  [id number]

## 2010-07-13 NOTE — H&P (Signed)
NAMESHERE, EISENHART               ACCOUNT NO.:  [id number]   MEDICAL RECORD NO.:  [id number]          PATIENT TYPE:  INP   LOCATION:  9175                          FACILITY:  [facility]   PHYSICIAN:  Crist Fat. Rivard, M.D. DATE OF BIRTH:  1970/03/14   DATE OF ADMISSION:  07/20/2007  DATE OF DISCHARGE:                              HISTORY & PHYSICAL   [name] is a 40 year old gravida 5, para 2-0-2-2 at 72 and 1/7 weeks  who presents for induction secondary to 330 mg of protein on a 24-hour  specimen.  She has been followed for elevated blood pressure in the last  1-2 weeks with no medication required.  Her blood pressures have been  somewhat labile with a max diastolic in the 95-97 range.  She had PIH  labs done on Jul 20, 2007, which all blood work was normal.  However,  there was 30 mg of protein on a voided specimen.  She was sent home to  do a 24-hour urine and brought that back into the office today.  In the  office today her blood pressure again was in 130s/80s, however, the 24-  hour urine did show the elevated protein.  She is therefore to be  admitted for induction.  She denies headache, visual symptoms or  epigastric pain.  Her cervix was 3-4 cm on her last exam on Jul 20, 2007.   Pregnancy has been remarkable for:  1. Advanced maternal age with amnio declined, but normal first      trimester screening.  2. Rh negative.  3. History of 2 SABs.  4. History of hypertension in her 66s with resolution with dietary      changes.  5. History of melanoma in the past.  6. Third trimester hypertension with no medication required.   PRENATAL LABS:  Blood type is B negative, Rh antibody negative, VDRL  nonreactive, rubella titer positive, hepatitis B surface antigen  negative, HIV is nonreactive.  Cystic fibrosis testing is negative.  Toxoplasmosis titers were negative.  Hemoglobin upon entering the  practice was 13.  It was within normal limits at 28 weeks,  Glucola was  normal.   First trimester screen was normal.  Group B strep culture was  negative at 36 weeks.   HISTORY OF PRESENT PREGNANCY:  The patient entered care at approximately  6-7 weeks.  She had an ultrasound at that time for dating with good  agreement with her [facility] of  Jul 26, 2007.  She had a first trimester  screen that was normal.  She was placed on Z-Pak and Flonase secondary  to sinus infection at approximately 12-13 weeks.  Her Glucola was  elevated at 153.  She had a normal 3-hour GTT.  She had a normal  ultrasound at 18 weeks.  She did have some anxieties that were addressed  at 18 weeks regarding fetal well-being and emergencies during  labor.  She at 35 weeks had some pedal edema.  Pressure was okay at that time.  Her B-strep culture was negative at 36 weeks.  At approximately 37-1/2  weeks  she began to have some mild elevations of her blood pressure.  She  was encouraged to rest.  This week she was taking her blood pressure at  home and was noted to have diastolics in the 90s.  She came into  maternity admissions on Jul 20, 2007, had a  negative lab pH workup with  essentially normal blood pressures, however, she had 30 mg of protein on  a voided specimen.  She was sent home to do a 24-hour specimen, bring it  to the office today.  Today's blood pressures were 130s/80s.  However,  she had 330 mg of protein on a 24-hour specimen.   OBSTETRICAL HISTORY:  In 2000, she had a vaginal birth of a female infant,  weight 8 pounds 2 ounces at 40 weeks.  She was in labor 12 hours.  She  had epidural anesthesia.  Baby did have a broken clavicle.  In 2004, she  had a vaginal birth of a female infant, weight 8 pounds 12 ounces at 40  weeks.  She was in labor 12 hours.  She had epidural anesthesia.  She  had no complications.  In 2006, she had a miscarriage at 6-7 weeks which  she passed naturally.  In 2008, she had a 7-week miscarriage that  required a D and C.  She had positive beta strep with her  second  pregnancy.   MEDICAL HISTORY:  She had a cervical polyp in one of  her previous  pregnancies due to cervical polyp that was removed by Dr. Pennie Rushing.  Her  blood pressure is B negative.  She is a previous birth control pill,  condoms, cervical cap, and Nuva ring user.  She reports the usual  childhood illnesses.  She has a history of hypertension in the 20s but  did have lifestyle changes.  She had a history of melanoma in the past  that was removed.   SURGICAL HISTORY:  Includes a D and C, wisdom teeth removed, and  melanoma removed in 2002.  Left wrist surgery in 2005 and an  appendectomy in 2002.   FAMILY HISTORY:  Her father has atrial fib.  Her mother and father have  chronic hypertension.  Her paternal grandmother had Alzheimer's.  Maternal grandmother had breast cancer at 17.  Her mother had breast  cancer at age 44, and her maternal aunt had ovarian cancer.  Genetic  history is remarkable for the patient being a fraternal twin.   ALLERGIES:  THE PATIENT IS NO KNOWN ALLERGIES.   SOCIAL HISTORY:  The patient is married to the father of the baby.  He  is involved and supportive.  His name is [employer].  She is  Caucasian.  Denies any religious affiliation.  She has an associate  degree.  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  Her husband has his doctorate.  He is a  Administrator, [employer] Service.  She has been followed by the Certified Nurse Midwife Service  [place].  She denies any alcohol, drug or tobacco use during  this pregnancy.   PHYSICAL EXAMINATION:  Blood pressure is 130/80.  Other vital signs  stable.  HEENT: Within normal limits.  LUNGS:  Her breath sounds are clear.  HEART:  Regular rate and rhythm, without murmur.  BREASTS:  Soft and nontender.  ABDOMEN:  Fundal height is approximately 38 cm.  Estimated fetal weight  is 8 to 8-1/2 pounds.  Uterine contractions very occasionally,  mild.  Fetal heart rate is reactive with no decelerations.  EXTREMITIES:  Deep tendon reflexes  are 2+ without clonus.  There is 2+  edema noted.   LABORATORY:  PIH labs are within normal limits with uric acid of 4.3 and  normal SGOT, SGPT and normal CBC.  Cervical exam is 3-4, 70%, vertex is  at -2 station, but is not particularly applied well applied to the  cervix.   IMPRESSION:  1. Uterine pregnancy at 39 and 1/7 weeks.  2. Proteinuria with occasional labile blood pressures.   PLAN:  1. Admit to birthing suite per consult with Dr. Silverio Lay as      attending physician.  2. Routine certified nurse midwife orders.  3. Will plan induction of labor with Pitocin and artificial rupture of      membranes as vertex descends with the patient planning epidural.      [name]. [name], C.N.M.      Crist Fat Rivard, M.D.  Electronically Signed    VLL/MEDQ  D:  07/20/2007  T:  07/20/2007  Job:  [id number]

## 2010-07-16 NOTE — H&P (Signed)
[facility]  Patient:    [name], [name] Visit Number: [id number] MRN: [id number]          Service Type: MED Location: [id number] Attending Physician:  Arlis Porta Dictated by:   Adolph Pollack, M.D. Admit Date:  02/01/2001 Discharge Date: 02/04/2001                           History and Physical  CHIEF COMPLAINT:  Abdominal pain, nausea and vomiting.  HISTORY OF PRESENT ILLNESS:  This 40 year old female awoke at 4:00 in the morning with a vague discomfort in her abdomen.  She had something to eat and discomfort persisted till 8 oclock.  She then began having worsening pain that migrated basically to the lower abdominal area.  This was associated with some nausea and vomiting.  It got severe enough that she presented to the emergency department.  She was evaluated by the emergency department physician and noted to have an elevation of her white blood cell count.  A CT scan of her abdomen was ordered and performed at approximately 10 p.m.  It was read as showing findings consistent with appendicitis.  It was for this reason that I was asked to see her.  She currently states she feels a little bit better than she did.  No dysuria or hematuria.  No diarrhea.  She is seven months postpartum and she has not had a menstrual period.  She is breast feeding.  PAST MEDICAL HISTORY:  No chronic illnesses.  PREVIOUS OPERATIONS:  Wisdom tooth extraction.  ALLERGIES:  None.  MEDICATIONS:  None.  SOCIAL HISTORY:  No tobacco or alcohol use.  She is married.  FAMILY HISTORY:  No chronic illnesses in the family.  REVIEW OF SYSTEMS:  CARDIOVASCULAR:  No hypertension, coronary artery disease. PULMONARY:  No asthma, pneumonia or TB.  GI:  No peptic ulcer disease, hepatitis, diverticulitis or hiatal hernia.  RENAL:  No kidney stones or urinary tract infection.  ENDOCRINE:  No diabetes or thyroid disease. NEUROLOGIC:  No seizure disorders.   HEMATOLOGIC:  No bleeding disorders or deep venous thrombosis.   PHYSICAL EXAMINATION:  GENERAL:  An ill-appearing female.  VITAL SIGNS:  Temperature is 101.  She is mildly tachycardic.  SKIN:  Warm and moist.  EYES:  Extraocular motions intact.  No icterus.  NECK:  Supple without palpable masses.  CARDIOVASCULAR:  Heart demonstrates an increased rate with a regular rhythm.  RESPIRATORY:  Breath sounds equal and clear with respirations nonlabored.  ABDOMEN:  Soft.  There is right lower quadrant tenderness to palpation and percussion.  No referred signs present.  No palpable masses noted.  EXTREMITIES:  No cyanosis or edema noted.  LABORATORY DATA:  White blood cell count of 13,000 with a leftward shift. Urinalysis within normal limits.  Urine pregnancy test is negative.  Review of CT scan demonstrates what appears to be a thickened appendix with some pericecal inflammation present.  There is also a small right ovarian cyst.  There is prominence of a left adrenal gland present.  IMPRESSION:  Acute appendicitis.  PLAN:  I recommend that she undergo a laparoscopy and appendectomy.  She was hesitant at first, thinking that she was improving, however, her temperature has risen to 101.  I told her that I felt that the information that we have obtained was a strong enough indication for her having appendicitis to proceed with the operation.  I did discuss the  risks including but not limited to bleeding, infection, accidental damage to intra-abdominal organs, and risks of anesthesia.  I did discuss the risk of her not having anything done and potentially having ruptured appendix with increased complications.  She seems to understand all these and is agreeable to proceed. Dictated by:   Adolph Pollack, M.D. Attending Physician:  Arlis Porta DD:  02/02/01 TD:  02/02/01 Job: [id number]

## 2010-07-16 NOTE — H&P (Signed)
[facility]  Patient:    [name], [name]                      MRN: [id number] Adm. Date:  [id number] Disc. Date: [id number] Attending:  Leonard Schwartz Dictator:   Wynelle Bourgeois, CNM                         History and Physical  HISTORY OF PRESENT ILLNESS:   This is a 40 year old G1, P0 at 39-6/7 weeks, who presented with complaints of rupture of membranes at 4 oclock this afternoon of clear fluid with uterine contractions for the last several days. The cervix was 4 cm in the office yesterday and is now 5-6 cm, 100% effaced at zero station.  She denies bleeding and reports positive fetal movement.  Her pregnancy has been remarkable for 1) endocervical polyp, 2) First trimester bleeding, 3) Rh negative, 4) group B strep negative.  PRENATAL LABORATORIES:        Hemoglobin 14.9, hematocrit 43.0, platelets 400, blood type B-negative, antibody screen positive for RhoGAM, RPR nonreactive, rubella immune, HBsAg negative, HIV nonreactive, Pap test normal, group B strep negative.  MEDICAL HISTORY:              Remarkable for history of borderline hypertension several years ago, which resolved with weight loss.  She also has a history of hay fever allergies.  Wisdom teeth extraction 1989, history of chronic back pain.  FAMILY HISTORY:               Remarkable for a maternal grandfather with an MI, paternal grandfather with a MI.  Father with hypertension.  Maternal grandmother with breast cancer postmenopausal.  Father, who smokes.  GENETIC HISTORY:              Unremarkable.  SOCIAL HISTORY:               She is married to [employer], who is involved and supportive.  She does not report a religious affiliation.  She denies any alcohol, tobacco or drug use.  OBJECTIVE DATA:  VITAL SIGNS:                  Stable.  HEENT:                        Within normal limits.  Thyroid normal, not                               enlarged.  BREASTS:                       Soft, nontender, no masses.  CHEST:                        Clear to auscultation bilaterally.  HEART:                        Regular rate and rhythm.  ABDOMEN:                      Gravid at 40 cm.  Vertex to [place].  Fetal monitor denotes reactive fetal heart rate with uterine contractions of 2-4 minutes.  CERVICAL:  Five to 6 cm, zero station with complete effacement, vertex presentation and clear fluid draining from the vagina.  EXTREMITIES:                  Within normal limits with trace edema.  ASSESSMENT:                   1. Rupture of membranes at term.                               2. Active labor.                               3. Group B strep negative.  PLAN:                         1. Admit to birthing suite, Dr. Pennie Rushing aware.                               2. Routine CNM orders.                               3. Epidural p.r.n. DD:  06/22/00 TD:  06/22/00 Job: [id number]

## 2010-07-16 NOTE — Op Note (Signed)
[facility] [place] [facility]  Patient:    [name], [name]                      MRN: [id number] Proc. Date: 11/30/99 Adm. Date:  [id number] Attending:  Dierdre Forth Pearline                           Operative Report  PREOPERATIVE DIAGNOSES:       1. Large cervical polyp.                               2. At [redacted] weeks gestation.                               3. First trimester bleeding.  POSTOPERATIVE DIAGNOSES:      1. Large cervical polyp.                               2. At [redacted] weeks gestation.                               3. First trimester bleeding.  OPERATION:                    Cervical polypectomy.  SURGEON:                      Vanessa P. Haygood, M.D.  ASSISTANT:  ANESTHESIA:                   None.  ESTIMATED BLOOD LOSS:         Less than 50 cc.  CONSULTATIONS:                None.  FINDINGS:                     Ultrasound evaluation revealed a 10 week intrauterine gestation with positive fetal heart tones.  There was a large cervical polyp which was totally separate from the gestation.  On cervical evaluation there was a 5 cm fleshy polyp exuding from the cervix with some degeneration at the tip.  DESCRIPTION OF PROCEDURE:     After the ultrasound evaluation, the patient was taken to the maternity admissions unit procedure room and placed on the table in lithotomy position.  The perineum and vagina were prepped with multiple layers of Betadine.  A weighted speculum was placed in the posterior vagina and the Sims speculum used to elevate the bladder.  The cervical polyp was grasped with ring forceps and two sutures of 2-0 Vicryl used to tie the cervical polyp near the base.  The distal portion of the cervical polyp was then excised and removed from the operating field.  Bleeding was noted to be minimal.  All instruments were then removed from the vagina and the polyp sent for pathologic evaluation.  The patient tolerated the procedure well and  was observed for 30 minutes for any excessive bleeding and then discharged to home.  Her follow-up will be on December 14, 1999, for obstetrical examination. The patient is Rh negative and received RhoGAM injection approximately two weeks ago. DD:  11/30/99 TD:  11/30/99 Job: [id number]

## 2010-07-16 NOTE — Op Note (Signed)
[facility]  Patient:    [name], [name] Visit Number: [id number] MRN: [id number]          Service Type: MED Location: [phone] Attending Physician:  Arlis Porta Dictated by:   Adolph Pollack, M.D. Proc. Date: 02/02/01 Admit Date:  02/01/2001 Discharge Date: 02/04/2001                             Operative Report  PREOPERATIVE DIAGNOSIS:  Acute appendicitis.  POSTOPERATIVE DIAGNOSIS:  Perforated appendicitis.  PROCEDURE:  Laparoscopic appendectomy.  SURGEON:  Adolph Pollack, M.D.  ANESTHESIA:  General.  HISTORY OF PRESENT ILLNESS:  This is a 40 year old female who developed progressive increase in abdominal pain throughout the day yesterday.  She came to the emergency department at 3 oclock.  After being there approximately four to five hours she noticed some relief of the pain.  A CT scan was suspicious for appendicitis, and she is now brought to the operating room.  DESCRIPTION OF PROCEDURE:  She was placed supine on the operating room table and general anesthetic was administered.  The abdomen was sterilely prepped and draped.  Then 0.5% plain Marcaine was infiltrated in the subumbilical region and a subumbilical incision was made incising the skin sharply.  The subcutaneous tissue was dissected bluntly.  The fascia was incised for a distance of 1 to 1.5 cm.  A purse-string suture of 0 Vicryl was placed around the fascial edges.  The peritoneal cavity was entered sharply under direct vision.  A Hasson trocar was introduced to the peritoneal cavity and a pneumoperitoneum was created by insufflation of CO2 gas.  Next the laparoscope was introduced, and I examined the right lower quadrant area and noted an inflamed appendix with purulent material grossly consistent with localized perforation of the appendix.  I subsequently placed her in the appropriate position and placed a 5 mm trocar through a left lower  quadrant incision and a 5 mm trocar in the right upper quadrant incision.  The appendix was grasp and the appendix was retracted anteriorly.  I divided the mesial appendix and the appendiceal artery using a harmonic scalpel.  I then exposed the junction of the cecum and the appendix and amputated the appendix at the junction with an Endo GIA stapler.  The appendix was placed in an Endopouch bag in the roof of the abdominal cavity.  I then copiously irrigated out the right lower quadrant area with normal saline solution and drained purulent material.  Once I had done this I inspected the staple line and it was intact without bleeding.  I evacuated as much fluid as possible.  I subsequently removed the trocars and released the pneumoperitoneum. I closed the fascial defect by tying down a purse-string suture, but when she was emerging from anesthesia she had a significant cough and the suture became disrupted.  We subsequently gave her more anesthetic, and then I closed the subumbilical fascial defect with interrupted 0 Vicryl sutures.  The skin incisions were then closed with 4-0 Monocryl subcuticular stitches. Steri-Strips and sterile dressings were applied.  She tolerated the procedure well without any apparent complications and was taken to the recovery room in satisfactory condition. Dictated by:   Adolph Pollack, M.D. Attending Physician:  Arlis Porta DD:  02/02/01 TD:  02/02/01 Job: [id number]

## 2010-07-16 NOTE — H&P (Signed)
NAME:  [name], [name]                         ACCOUNT NO.:  [id number]   MEDICAL RECORD NO.:  [id number]                   PATIENT TYPE:  INP   LOCATION:  9168                                 FACILITY:  [facility]   PHYSICIAN:  Osborn Coho, M.D.                DATE OF BIRTH:  [birth year]   DATE OF ADMISSION:  08/13/2002  DATE OF DISCHARGE:                                HISTORY & PHYSICAL   ADMISSION HISTORY AND PHYSICAL:  Ms. Filice is a 40 year old married, white  female, gravida 2, para 1-0-0-1 at 39-4/7 weeks who presents with uterine  contractions every five minutes.  Her cervix is 4 cm in the office today.  She denies any leaking, bleeding, headache, nausea, vomiting or visual  disturbances.  Her pregnancy has been followed by the [facility]  OB/GYN Certified Nurse Midwife Service and has been remarkable for 1)  Rh  negative.  2)  History of endocervix polyps.  3)  History of fibroids.  4)  History of borderline hypertension.  5)  Chronic back pain.  6)  Group B  strep positive.   PRENATAL LABS:  Collected on February 07, 2002.  Hemoglobin was 12.5,  hematocrit 34.4, platelets 289,000.  Blood type B negative.  Rh antibody  negative.  RPR nonreactive.  Rubella immune.  Hepatitis B surface antigen  negative.  Pap smear from June 2003 was within normal limits.  Gonorrhea  negative.  Chlamydia negative.  Cystic fibrosis testing negative.  Maternal  serum alpha fetoprotein from March 01, 2002 was within normal range.  One  hour Glucola from May 22, 2002 was 128.  Her hemoglobin at that time was  11.0.  Culture of the vaginal track for Group B strep on June 17, 2002 was  positive.   HISTORY OF PRESENT PREGNANCY:  She presented for care on January 31, 2002 at  approximately [redacted] weeks gestation.  She had some allergic rhinitis and  occasional sinus headaches and shortness of breath throughout most of her  pregnancy that was treated with Sudafed and Claritin.  Pregnancy  ultrasonography at [redacted] weeks gestation showed growth consistent with previous  dating.  She was given RhoGAM at around [redacted] weeks gestation.  At around 31-  1/2 weeks she had some questionable cramping and contraction.  Her cervix  was found to be fingertip, 50% and vertex, -3.  Her fetal fibronectin at  that time was negative and her Group B strep was positive.  She was sent to  maternity admissions for preterm labor evaluation.   OB HISTORY:  She is a gravida 2, para 1-0-0-1.  In April 2002 she vaginally  delivered a female infant weighing 8 pounds and 2 ounces at [redacted] weeks gestation  after 11 hours of labor.  She had an epidural for anesthesia.  The infant's  name was [place].  There were no complications with the pregnancy.  During  the  delivery the cord evulsed and she had a manual placenta removal by Wynelle Bourgeois, certified nurse midwife.   MEDICAL HISTORY:  1. She has no medication allergies.  2. She has used oral contraceptives in the past.  3. Fibroids were found on early ultrasound with her first pregnancy.  4. She had an endocervix polyp removed in December of 2001.  5. She reports having the usual childhood illnesses.  6. She has a history of borderline hypertension that was resolved with     weight loss.  7. She also has a history of chronic back pain.  8. History of allergies to mold.   SURGICAL HISTORY:  1. Ruptured appendix in December 2002.  2. Removal of endocervical polyp in October 2001.  3. She has had a wisdom teeth extraction in 1989.   FAMILY HISTORY:  Maternal grandfather and paternal grandfather with MI.  Father with hypertension.  Mother with breast cancer.  Maternal grandmother  with breast cancer.   GENETIC HISTORY:  Unremarkable.   SOCIAL HISTORY:  This patient is married to the father of the baby.  His  name is Clint.  He is involved and supportive.  Patient and father of the  baby are college educated and deny alcohol, tobacco or illicit drug with  the  pregnancy.   OBJECTIVE DATA:  VITAL SIGNS:  Stable.  She is afebrile.  HEENT:  Grossly within normal limits.  CHEST:  Clear to auscultation.  HEART:  Regular, rate and rhythm.  ABDOMEN:  Gravid in contour with fundal height extending approximately 40 cm  above the pubic symphysis.  Electronic fetal monitoring shows reactive and  reassuring fetal heart rate.  Uterine contractions every five minutes.  Cervical exam is 5 cm, 80%, vertex, -1, slightly posterior.  EXTREMITIES:  Within normal limits.   ASSESSMENT:  1. Intrauterine pregnancy at term.  2. Early active labor.  3. Positive Group B streptococcus.   PLAN:  1. Admit to birthing suite for consult with Dr. Su Hilt.  2. Routine CNM orders.  3. Treat Group B strep with ampicillin secondary to possible rapid labor.  4. Patient may desire epidural.     Cam Hai, C.N.M.                     Osborn Coho, M.D.    KS/MEDQ  D:  08/13/2002  T:  08/13/2002  Job:  [id number]

## 2010-11-22 LAB — RH IMMUNE GLOBULIN WORKUP (NOT WOMEN'S HOSP)
ABO/RH(D): B NEG
Antibody Screen: NEGATIVE

## 2010-11-24 LAB — COMPREHENSIVE METABOLIC PANEL
ALT: 53 — ABNORMAL HIGH
AST: 16
Albumin: 3 — ABNORMAL LOW
Alkaline Phosphatase: 89
BUN: 11
BUN: 8
CO2: 22
Calcium: 9.3
Chloride: 107
Creatinine, Ser: 0.43
Creatinine, Ser: 0.43
GFR calc Af Amer: >60
GFR calc non Af Amer: >60
Glucose, Bld: 106 — ABNORMAL HIGH
Potassium: 4.1
Total Bilirubin: 0.7
Total Bilirubin: 0.8
Total Protein: 6.1

## 2010-11-24 LAB — URINALYSIS, ROUTINE W REFLEX MICROSCOPIC
Bilirubin Urine: NEGATIVE
Glucose, UA: NEGATIVE
Glucose, UA: NEGATIVE
Leukocytes, UA: NEGATIVE
Protein, ur: 30 — AB
Specific Gravity, Urine: 1.025
Urobilinogen, UA: 0.2
pH: 5

## 2010-11-24 LAB — CBC
HCT: 29.2 — ABNORMAL LOW
HCT: 32 — ABNORMAL LOW
Hemoglobin: 10.1 — ABNORMAL LOW
Hemoglobin: 11.1 — ABNORMAL LOW
MCHC: 34.8
MCHC: 34.9
MCV: 91.3
MCV: 92.5
Platelets: 301
RBC: 3.45 — ABNORMAL LOW
RBC: 4.23
RDW: 15.1
RDW: 15.3
WBC: 7
WBC: 8.8

## 2010-11-24 LAB — CORD BLOOD PRIVATE-SAMPLE DRAW

## 2010-11-24 LAB — URIC ACID
Uric Acid, Serum: 4.3
Uric Acid, Serum: 6

## 2010-11-24 LAB — LACTATE DEHYDROGENASE
LDH: 135
LDH: 233

## 2010-11-24 LAB — URINE MICROSCOPIC-ADD ON

## 2010-11-24 LAB — HEMOGLOBIN AND HEMATOCRIT, BLOOD: Hemoglobin: 10.5 — ABNORMAL LOW

## 2010-11-24 LAB — RPR: RPR Ser Ql: NONREACTIVE

## 2010-12-30 ENCOUNTER — Encounter: Payer: Self-pay | Admitting: Family Medicine

## 2010-12-30 ENCOUNTER — Ambulatory Visit (INDEPENDENT_AMBULATORY_CARE_PROVIDER_SITE_OTHER): Payer: BC Managed Care – PPO | Admitting: Family Medicine

## 2010-12-30 VITALS — BP 120/80 | HR 84 | Temp 98.6°F | Ht 64.75 in | Wt 198.6 lb

## 2010-12-30 DIAGNOSIS — R209 Unspecified disturbances of skin sensation: Secondary | ICD-10-CM

## 2010-12-30 MED ORDER — NAPROXEN 500 MG PO TABS: 500.0000 mg | ORAL_TABLET | Freq: Two times a day (BID) | ORAL | Status: DC

## 2010-12-30 NOTE — Progress Notes (Signed)
  Subjective:    Patient ID: [name], female    DOB: 09/03/1970, 40 y.o.   MRN: [id number]  HPI R thumb- numb and tingling x6 days.  'like it fell asleep'.  Also having sxs in the 1st finger.  Pt reports fingers feel cold, no discoloration.  No wrist or forearm pain but upper arm feels achey.  No hx of similar.  Works on a Anim[employer], R hand dominant.   Review of Systems For ROS see HPI     Objective:   Physical Exam  Vitals reviewed. Constitutional: She appears well-developed and well-nourished. No distress.  Musculoskeletal:       Right hand: She exhibits normal range of motion, no tenderness, no bony tenderness, normal capillary refill and no swelling. decreased sensation noted. Decreased sensation is present in the medial distribution. Normal strength noted.  Neurological: She has normal reflexes. Coordination normal.          Assessment & Plan:

## 2010-12-30 NOTE — Patient Instructions (Signed)
We'll call you with your hand appt Continue the Naproxen twice daily- take w/ food ICE your hand/wrist if painful Call with any questions or concerns Hang in there!!!

## 2011-01-04 NOTE — Assessment & Plan Note (Signed)
Pt's sxs most consistent w/ carpal tunnel.  Will refer to hand specialist for complete evaluation.  In the interim will start NSAIDs and ice to decrease pain and inflammation.  Pt expressed understanding and is in agreement w/ plan.

## 2011-02-14 ENCOUNTER — Telehealth: Payer: Self-pay | Admitting: *Deleted

## 2011-02-14 NOTE — Telephone Encounter (Signed)
Pt should take no more than 2 tabs daily.  Should avoid caffeinated drinks, alcohol, and spicy foods as this worsens reflux.  Should avoid eating prior to lying down (goal is to remain upright for up to 2 hrs after eating).  If her chest pain worsens, she needs OV or to go to ER.

## 2011-02-14 NOTE — Telephone Encounter (Signed)
Spoke to pt to advise results/instructions. Pt understood. Pt advised that she may call in the am if she does not feel better.

## 2011-02-14 NOTE — Telephone Encounter (Signed)
Pt called in per noted fever of 100.5 this am , lowered with ibuprofen, also noted achy all over , she is post steroids for back pain, also noted  Burning chest pain in middle of chest while laying down last night and this resolved with Prilosec Complete, however she has never noted any reflux before, she did note this came back after she drank a coke, was advised that she can take Prilosec Complete as much as she wants by a friend that is a NP however bottle notes no more than 2 pills, wanted to verify with you what she can take?

## 2011-03-01 HISTORY — PX: ABDOMINAL HYSTERECTOMY: SHX81

## 2011-03-02 ENCOUNTER — Ambulatory Visit (INDEPENDENT_AMBULATORY_CARE_PROVIDER_SITE_OTHER): Payer: BC Managed Care – PPO | Admitting: Family Medicine

## 2011-03-02 ENCOUNTER — Encounter: Payer: Self-pay | Admitting: Family Medicine

## 2011-03-02 VITALS — BP 120/75 | HR 77 | Temp 98.7°F | Ht 64.5 in | Wt 200.4 lb

## 2011-03-02 DIAGNOSIS — M79609 Pain in unspecified limb: Secondary | ICD-10-CM

## 2011-03-02 DIAGNOSIS — M79629 Pain in unspecified upper arm: Secondary | ICD-10-CM | POA: Insufficient documentation

## 2011-03-02 NOTE — Progress Notes (Signed)
  Subjective:    Patient ID: [name], female    DOB: 1970/12/23, 41 y.o.   MRN: [id number]  HPI L armpit pain- sxs started 2 weeks ago.  Pain started after flu, thought it was LN.  Then thought it was ingrown hair.  Now reports 'tight cord' from armpit into upper arm.  Pain is constant- worse w/ stretching.  No sxs on R.  No LNs in neck or groin.   Review of Systems For ROS see HPI     Objective:   Physical Exam  Vitals reviewed. Constitutional: She appears well-developed and well-nourished. No distress.  Lymphadenopathy:    She has no cervical adenopathy.    She has axillary adenopathy.       Right axillary: No pectoral and no lateral adenopathy present.       Left axillary: Pectoral adenopathy present. No lateral adenopathy present.      Right: No inguinal, no supraclavicular and no epitrochlear adenopathy present.       Left: No inguinal, no supraclavicular and no epitrochlear adenopathy present.       Rope like cord in L axillae- TTP          Assessment & Plan:

## 2011-03-02 NOTE — Patient Instructions (Signed)
We'll call you with your [place] appt Ibuprofen as needed for pain relief Heat or ice as needed Call with any questions or concerns Hang in there! Happy New Year!!!

## 2011-03-04 ENCOUNTER — Ambulatory Visit
Admission: RE | Admit: 2011-03-04 | Discharge: 2011-03-04 | Disposition: A | Discharge status: Home / Self Care | Payer: BC Managed Care – PPO | Source: Ambulatory Visit | Attending: Family Medicine | Admitting: Family Medicine

## 2011-03-04 DIAGNOSIS — M79629 Pain in unspecified upper arm: Secondary | ICD-10-CM

## 2011-03-07 ENCOUNTER — Telehealth: Payer: Self-pay | Admitting: Family Medicine

## 2011-03-07 NOTE — Telephone Encounter (Signed)
Patient calling, wants the results of her Venous U/S she had done on Friday, 03-04-11, please.

## 2011-03-07 NOTE — Telephone Encounter (Signed)
Called pt with results, pt understands

## 2011-03-09 ENCOUNTER — Ambulatory Visit
Admission: RE | Admit: 2011-03-09 | Discharge: 2011-03-09 | Disposition: A | Discharge status: Home / Self Care | Payer: BC Managed Care – PPO | Source: Ambulatory Visit | Attending: Family Medicine | Admitting: Family Medicine

## 2011-03-09 DIAGNOSIS — M79629 Pain in unspecified upper arm: Secondary | ICD-10-CM

## 2011-03-10 ENCOUNTER — Telehealth: Payer: Self-pay | Admitting: *Deleted

## 2011-03-10 DIAGNOSIS — I809 Phlebitis and thrombophlebitis of unspecified site: Secondary | ICD-10-CM

## 2011-03-10 NOTE — Telephone Encounter (Signed)
Pt has superficial clot in armpit but if she is continuing to have arm pain and it is worsening will need to see either ortho or vascular.  Was thinking ortho first (if they will see her) b/c they are quicker to get in w/.  Renee- please see where pt can be seen and how soon- and we will put in referral.

## 2011-03-10 NOTE — Telephone Encounter (Signed)
Most times there is no obvious reason for clot.  Pain should be improving w/ NSAIDs and heat and if it is worsening may need to go to UC or ER for further evaluation as there is nothing that can be done in office.  Is there redness overlying the painful vein?  If so, will need to start abx- keflex 500mg  TID x7 days.  Selena Batten, please enter vascular referral (dx: superficial thrombophlebitis).

## 2011-03-10 NOTE — Telephone Encounter (Signed)
Spoke with pt and she advised that before her pain was noted upon touching the vein in her armpit and she was unable to put Deoderant on without it hurting her, now pt states it no longer hurts in her arm pit instead it hurts when she now touches the vein in her forearm, spoke with MD Lowne and she advised if the pt sxs worsen then she needs to go to ER, delegated verbal order to pt to go to ER if sxs worsen, pt understood. currently referral has been sent out for pt to the Vascular/Vein as of today per ortho advised the vascular referral would be best for pt per clot in armpit. Pt asked why ortho referral advice, I advised per location of clot as well as it can be faster to get in with ortho was the reason for ortho however no longer an option, pt wanted to ask MD Beverely Low how concerned should she be with this condition and why at her age with her history of no issues would this happen to her? I told pt I would speak with MD Tabori and give a call back with latest instructions as well as pt can make an appt to come in to discuss. Pt understood

## 2011-03-10 NOTE — Telephone Encounter (Signed)
Pt left vm stating she is confused by her instructions given by this nurse advising that if her pain does not subside with the NSAIDS and heat application that MD Beverely Low will send her to ortho, pt advised that the pain has extended slowly, she asked why would she be sent to ortho for this issue?

## 2011-03-10 NOTE — Telephone Encounter (Signed)
Spoke with referral rep to advise if she can get pt in with either ortho or vascular, rep advised vascular can be hard to get in with in a hurry but she will call to see what can be done, left message on pt home phone as well as left message on cell with my extension for her to call me back even after 5pm to advise we are trying to get her an appt with either ortho or vascular

## 2011-03-11 NOTE — Telephone Encounter (Signed)
Called pt to advise instructions in prior documentation, left vm on home number in chart, also called mobile number, spoke to pt with instructions and affirmed that the referral had been sent out for the vascular dept and that someone would be calling her about the appt time as soon as possible, pt denies any redness anywhere, she did note that she only saw the vein in her armpit however now the vein is visible in the crook of her elbow, but the nsaids and heat are currently helping and pt will go to ER or UC if any sxs worsen, pt will call office if any redness does occur, sent referral in chart

## 2011-03-13 NOTE — Assessment & Plan Note (Signed)
Area in question more consistent w/ thrombophlebitis or DVT rather than lymphadenitis.  Get [place] to assess.  NSAIDs for pain.  No overlying redness, no need for abx.  Next steps depending on results of [place].  Pt expressed understanding and is in agreement w/ plan.

## 2011-04-06 ENCOUNTER — Encounter: Payer: Self-pay | Admitting: Vascular Surgery

## 2011-04-07 ENCOUNTER — Ambulatory Visit (INDEPENDENT_AMBULATORY_CARE_PROVIDER_SITE_OTHER): Payer: BC Managed Care – PPO | Admitting: Vascular Surgery

## 2011-04-07 ENCOUNTER — Encounter: Payer: Self-pay | Admitting: Vascular Surgery

## 2011-04-07 VITALS — BP 124/79 | HR 91 | Resp 16 | Ht 64.0 in | Wt 195.0 lb

## 2011-04-07 DIAGNOSIS — I808 Phlebitis and thrombophlebitis of other sites: Secondary | ICD-10-CM

## 2011-04-07 NOTE — Progress Notes (Signed)
[facility] HISTORY AND PHYSICAL   History of Present Illness:  Patient is a 41 y.o. year old female who presents for evaluation of a left superficial thrombophlebitis of the axilla.  The patient had complaints of pain in her left axillary region around Christmas of this past year. She denies any trauma to that area. She currently works as a Catering manager. She denies any recent IVs in the left upper extremity really recent blood draws. She denies family history or personal history of hypercoagulable state. She was diagnosed with a superficial thrombophlebitis in this area. She was treated conservatively and her symptoms improved. She has had no other prior episodes. Her prior workup included a venous duplex on January 4 which showed a superficial thrombophlebitis in the left axilla. She also had a mammogram which was negative. She denies use of oral contraceptives. She denies smoking. 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  She also complains of occasional pain in her right anterior thigh and right dorsal foot. She has had no other prior episodes of thrombophlebitis.  Past Medical History  Diagnosis Date  . Cancer 2002    Left leg    History reviewed. No pertinent past surgical history.   Social History History  Substance Use Topics  . Smoking status: Never Smoker   . Smokeless tobacco: Not on file  . Alcohol Use: No    Family History Family History  Problem Relation Age of Onset  . Cancer Mother   . Hypertension Mother   . Cancer Father   . Heart disease Father   . Hypertension Father     Allergies  No Known Allergies   Current Outpatient Prescriptions  Medication Sig Dispense Refill  . cetirizine (ZYRTEC) 5 MG chewable tablet Chew 5 mg by mouth daily.        . multivitamin (THERAGRAN) per tablet Take 1 tablet by mouth daily.        . naproxen (NAPROSYN) 500 MG tablet Take 1 tablet (500 mg  total) by mouth 2 (two) times daily with a meal.  60 tablet  2    ROS:   General:  No weight loss, Fever, chills  HEENT: No recent headaches, no nasal bleeding, no visual changes, no sore throat  Neurologic: No dizziness, blackouts, seizures. No recent symptoms of stroke or mini- stroke. No recent episodes of slurred speech, or temporary blindness.  Cardiac: No recent episodes of chest pain/pressure, no shortness of breath at rest.  No shortness of breath with exertion.  Denies history of atrial fibrillation or irregular heartbeat  Vascular: No history of rest pain in feet.  No history of claudication.  No history of non-healing ulcer, No history of DVT   Pulmonary: No home oxygen, no productive cough, no hemoptysis,  No asthma or wheezing  Musculoskeletal:  [ ]  Arthritis, [ ]  Low back pain,  [ ]  Joint pain  Hematologic:No history of hypercoagulable state.  No history of easy bleeding.  No history of anemia  Gastrointestinal: No hematochezia or melena,  No gastroesophageal reflux, no trouble swallowing  Urinary: [ ]  chronic Kidney disease, [ ]  on HD - [ ]  MWF or [ ]  TTHS, [ ]  Burning with urination, [ ]  Frequent urination, [ ]  Difficulty urinating;   Skin: No rashes  Psychological: No history of anxiety,  No history of depression   Physical Examination  Filed Vitals:   04/07/11 1417  BP: 124/79  Pulse: 91  Resp: 16  Height: 5\' 4"  (1.626 m)  Weight: 195 lb (88.451 kg)  SpO2: 98%    Body mass index is 33.47 kg/(m^2).  General:  Alert and oriented, no acute distress HEENT: Normal Neck: No bruit or JVD Pulmonary: Clear to auscultation bilaterally Cardiac: Regular Rate and Rhythm without murmur Abdomen: Soft, non-tender, non-distended Skin: No rash, scattered spider-type varicosities on the anterior right thigh as well as the posterior calf bilaterally Extremity Pulses:  2+ radial, brachial, femoral, dorsalis pedis, posterior tibial pulses bilaterally,  thorough  palpation of the left axillary region as well as the anterior portion and tail of the left breast was performed K. McChesney was present as a chaperone there was no palpable abnormality Musculoskeletal: No deformity or edema  Neurologic: Upper and lower extremity motor 5/5 and symmetric   ASSESSMENT:   Superficial thrombophlebitis left axillary region now resolved. The patient was reassured that this most likely was related to a traumatic event either from her bra rubbing this area or some other minor trauma she did not recognize. I do not believe she needs any hypercoagulable workup at this point. She will inform her doctor if she has any recurrent episodes. She does have some scattered varicosities in the lower extremities bilaterally but these are not bothersome to her.   PLAN:  She will followup on as-needed basis if she has further episodes in the future  Fabienne Bruns, MD [facility] Office: [phone] Pager: [phone]

## 2011-06-29 ENCOUNTER — Ambulatory Visit (INDEPENDENT_AMBULATORY_CARE_PROVIDER_SITE_OTHER): Payer: BC Managed Care – PPO | Admitting: Family Medicine

## 2011-06-29 ENCOUNTER — Encounter: Payer: Self-pay | Admitting: Family Medicine

## 2011-06-29 ENCOUNTER — Encounter: Payer: Self-pay | Admitting: *Deleted

## 2011-06-29 DIAGNOSIS — IMO0001 Reserved for inherently not codable concepts without codable children: Secondary | ICD-10-CM

## 2011-06-29 DIAGNOSIS — R5383 Other fatigue: Secondary | ICD-10-CM

## 2011-06-29 DIAGNOSIS — M2550 Pain in unspecified joint: Secondary | ICD-10-CM

## 2011-06-29 DIAGNOSIS — M797 Fibromyalgia: Secondary | ICD-10-CM

## 2011-06-29 DIAGNOSIS — Z0000 Encounter for general adult medical examination without abnormal findings: Secondary | ICD-10-CM

## 2011-06-29 LAB — CBC WITH DIFFERENTIAL/PLATELET
Basophils Absolute: 0 10*3/uL (ref 0.0–0.1)
Eosinophils Absolute: 0.2 10*3/uL (ref 0.0–0.7)
HCT: 40.2 % (ref 36.0–46.0)
Lymphocytes Relative: 39.8 % (ref 12.0–46.0)
Lymphs Abs: 2 10*3/uL (ref 0.7–4.0)
MCHC: 34 g/dL (ref 30.0–36.0)
Monocytes Relative: 6.2 % (ref 3.0–12.0)
Platelets: 308 10*3/uL (ref 150.0–400.0)
RDW: 13.3 % (ref 11.5–14.6)

## 2011-06-29 LAB — LIPID PANEL
Cholesterol: 168 mg/dL (ref 0–200)
HDL: 56.1 mg/dL (ref >39.00)
LDL Cholesterol: 94 mg/dL (ref 0–99)
Total CHOL/HDL Ratio: 3
Triglycerides: 91 mg/dL (ref 0.0–149.0)
VLDL: 18.2 mg/dL (ref 0.0–40.0)

## 2011-06-29 LAB — HEPATIC FUNCTION PANEL
Alkaline Phosphatase: 46 U/L (ref 39–117)
Bilirubin, Direct: 0 mg/dL (ref 0.0–0.3)
Total Bilirubin: 0.7 mg/dL (ref 0.3–1.2)
Total Protein: 7 g/dL (ref 6.0–8.3)

## 2011-06-29 LAB — BASIC METABOLIC PANEL
CO2: 26 mEq/L (ref 19–32)
Calcium: 9.3 mg/dL (ref 8.4–10.5)
Creatinine, Ser: 0.4 mg/dL (ref 0.4–1.2)
Sodium: 141 mEq/L (ref 135–145)

## 2011-06-29 MED ORDER — DULOXETINE HCL 30 MG PO CPEP: 30.0000 mg | ORAL_CAPSULE | Freq: Every day | ORAL | Status: DC

## 2011-06-29 MED ORDER — MELOXICAM 15 MG PO TABS: 15.0000 mg | ORAL_TABLET | Freq: Every day | ORAL | Status: DC

## 2011-06-29 NOTE — Patient Instructions (Signed)
Follow up in 4-6 weeks to recheck muscle/joint pains Start the Mobic daily for the joint pain Take the Cymbalta once daily for possible fibromyalgia We'll notify you of your lab results Call with any questions or concerns Hang in there!

## 2011-06-29 NOTE — Progress Notes (Signed)
  Subjective:    Patient ID: [name], female    DOB: [birth year], 41 y.o.   MRN: [id number]  HPI CPE- has GYN ([facility])  Aches and pains- 'it's a good day if i can get by w/out taking ibuprofen that day'.  Having pain in hands, feet, neck, shoulder pain intermittently.  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  Has seen ortho.  Ibuprofen relieves pain- will take 2 tabs and pain improves.  Pains are not consistently in the same joint.  Exercising 3x/week, active w/ horses.  Reports she will have leg pain from knees down when sitting or resting- no sxs during the day when active.  Has bad lumbar disc.  Reports she also has multiple painful areas that are not joint or bone.  Doesn't currently have one to show me.   Review of Systems Patient reports no vision/ hearing changes, adenopathy,fever, weight change,  persistant/recurrent hoarseness , swallowing issues, chest pain, palpitations, edema, persistant/recurrent cough, hemoptysis, dyspnea (rest/exertional/paroxysmal nocturnal), gastrointestinal bleeding (melena, rectal bleeding), abdominal pain, significant heartburn, bowel changes, GU symptoms (dysuria, hematuria, incontinence), Gyn symptoms (abnormal  bleeding, pain),  syncope, focal weakness, memory loss, numbness & tingling, skin/hair/nail changes, abnormal bruising or bleeding, anxiety, or depression.     Objective:   Physical Exam General Appearance:    Alert, cooperative, no distress, appears stated age  Head:    Normocephalic, without obvious abnormality, atraumatic  Eyes:    PERRL, conjunctiva/corneas clear, EOM's intact, fundi    benign, both eyes  Ears:    Normal TM's and external ear canals, both ears  Nose:   Nares normal, septum midline, mucosa normal, no drainage    or sinus tenderness  Throat:   Lips, mucosa, and tongue normal; teeth and gums normal  Neck:   Supple, symmetrical, trachea midline, no adenopathy;    Thyroid: no enlargement/tenderness/nodules  Back:      Symmetric, no curvature, ROM normal, no CVA tenderness  Lungs:     Clear to auscultation bilaterally, respirations unlabored  Chest Wall:    No tenderness or deformity   Heart:    Regular rate and rhythm, S1 and S2 normal, no murmur, rub   or gallop  Breast Exam:    Deferred to GYN  Abdomen:     Soft, non-tender, bowel sounds active all four quadrants,    no masses, no organomegaly  Genitalia:    Deferred to GYN  Rectal:    Extremities:   Extremities normal, atraumatic, no cyanosis or edema.  + TTP over multiple trigger points  Pulses:   2+ and symmetric all extremities  Skin:   Skin color, texture, turgor normal, no rashes or lesions  Lymph nodes:   Cervical, supraclavicular, and axillary nodes normal  Neurologic:   CNII-XII intact, normal strength, sensation and reflexes    throughout          Assessment & Plan:

## 2011-07-04 ENCOUNTER — Telehealth: Payer: Self-pay | Admitting: *Deleted

## 2011-07-04 ENCOUNTER — Encounter: Payer: Self-pay | Admitting: *Deleted

## 2011-07-04 NOTE — Telephone Encounter (Signed)
Does she want to try the 20mg  dose instead?  This would help her pain and hopefully lessen the other side effects (also needs to take first thing in the AM)

## 2011-07-04 NOTE — Telephone Encounter (Signed)
Pt notes that she can not sleep at night and thus causing her to be fatigued during the day, feels like she is stoned, noted after 5 days of taking, worsened yesterday by causing nauseated, pt notes she did not take this medication today and that she does feel better today, she did want to advise MD Tabori that it did help with leg/hands and feet pain but would rather wait til this issue got much worse before taking this medication, pt interested in some alternative therapies concerning the pain, please advise, pt advised that I can leave detailed message on her vm

## 2011-07-04 NOTE — Telephone Encounter (Signed)
Pt left vm stating that she was having some side effects due to the recent medication given, Cymbalta, called pt back and left vm to call office to clarify sxs.

## 2011-07-05 NOTE — Assessment & Plan Note (Signed)
New.  Suspect pt's fatigue and body aches are consistent w/ fibromyalgia.  Has already had negative w/u by Rheum.  Start low dose Cymbalta to see if sxs improve.  Will follow closely.

## 2011-07-05 NOTE — Assessment & Plan Note (Signed)
Start daily NSAID for ongoing joint pain.  Will follow.

## 2011-07-05 NOTE — Assessment & Plan Note (Signed)
Pt's PE WNL w/ exception of tender trigger points.  UTD on GYN.  Check labs.  Anticipatory guidance provided.

## 2011-07-05 NOTE — Telephone Encounter (Signed)
Called pt to verify if she wants to try the 20mg  and take early in the am, pt advised that she would rather stop taking the medication all together and if she decides later to try the 20mg  she will call our office back. Pt has stopped taking the cymbalta completely now, MD Tabori made aware verbally

## 2011-07-07 ENCOUNTER — Telehealth: Payer: Self-pay | Admitting: *Deleted

## 2011-07-07 NOTE — Telephone Encounter (Signed)
left voice message to inform the patient of the new date and time of the high risk clinic on 08-10-2011 at 3:00pm asked the patient to please call me back and confirm that she did get the message

## 2011-07-11 ENCOUNTER — Telehealth: Payer: Self-pay | Admitting: *Deleted

## 2011-08-02 ENCOUNTER — Ambulatory Visit (INDEPENDENT_AMBULATORY_CARE_PROVIDER_SITE_OTHER): Payer: BC Managed Care – PPO | Admitting: Obstetrics and Gynecology

## 2011-08-02 ENCOUNTER — Encounter: Payer: Self-pay | Admitting: Obstetrics and Gynecology

## 2011-08-02 VITALS — BP 118/68 | Ht 64.0 in | Wt 197.0 lb

## 2011-08-02 DIAGNOSIS — Z9889 Other specified postprocedural states: Secondary | ICD-10-CM

## 2011-08-02 DIAGNOSIS — N809 Endometriosis, unspecified: Secondary | ICD-10-CM

## 2011-08-02 DIAGNOSIS — N921 Excessive and frequent menstruation with irregular cycle: Secondary | ICD-10-CM

## 2011-08-02 NOTE — Progress Notes (Signed)
  Current contraception: tubal ligation. Hormone replacement therapy: No New medication: No  History of ZOX:WRUE  History of infertility: no. History of abnormal Pap smear: no History of fibroids: No  Increased stress: No  Abnormal bleeding pattern started: c/o spotting for 2 months continuous with cramping. Also c/o bright red bleeding heavy directly after intercourse. States that after 2 months of spotting she will go for a month without any bleeding. During that time she will have clear discharge with no odor.  Fam Hx:  Significant for Maternal aunt and Maternal great grandmother with ovarian cancer.  Mom with breast cancer  ROS: The patient admits that her current bleeding is no different than that experienced at her annual examination in September, and that it is much improved over the bleeding and pain that she had prior to her endometrial ablation. She is currently trying to decide whether she finds her quality of life associated with recurrent bleeding adequate, whether she wants to pursue hysterectomy She has a history of endometriosis and understands that this is a chronic condition, unlikely to be completely resolved until after menopause. She also has concerns about retaining her ovaries in light of her family history.  Examination: BP 118/68  Ht 5\' 4"  (1.626 m)  Wt 197 lb (89.359 kg)  BMI 33.82 kg/m2 Pelvic: EGBUS within normal limits Vagina rugous Cervix nontender Uterus upper limits of normal size mobile and mildly tender Adnexa no mass  Impression: Endometriosis Abnormal uterine bleeding slightly improved status post endometrial ablation Strong family history of ovarian cancer Family history of breast  Recommendation: The patient has an [facility] for [facility] counseling because of her family history. She is considering hysterectomy and she would be a candidate for a minimally invasive procedure.  The genetic counseling  showed a cyst with a decision around ovarian conservation if indeed the ovaries appear normal. She will followup with me after she has had the genetic consultation.

## 2011-08-02 NOTE — Patient Instructions (Signed)
ACOG INFO ON HYSTERECTOMY

## 2011-08-03 ENCOUNTER — Ambulatory Visit: Payer: BC Managed Care – PPO | Admitting: Family Medicine

## 2011-08-10 ENCOUNTER — Encounter: Payer: Self-pay | Admitting: Family

## 2011-08-10 ENCOUNTER — Ambulatory Visit (HOSPITAL_BASED_OUTPATIENT_CLINIC_OR_DEPARTMENT_OTHER): Payer: BC Managed Care – PPO | Admitting: Family

## 2011-08-10 DIAGNOSIS — IMO0002 Reserved for concepts with insufficient information to code with codable children: Secondary | ICD-10-CM

## 2011-08-10 DIAGNOSIS — Z803 Family history of malignant neoplasm of breast: Secondary | ICD-10-CM

## 2011-08-10 NOTE — Progress Notes (Signed)
[facility]  High Risk Clinic New Patient Evaluation  Name: [name]            Date: 08/10/2011 MRN: [id number]                DOB: Apr 12, 1970  CC: Neena Rhymes, MD  Dierdre Forth, MD.    REFERRING PHYSICIAN: Sheliah Hatch, MD   REASON FOR VISIT:  Assessment in the High Risk Clinic, discuss genetic testing.   HISTORY OF PRESENT ILLNESS: Has family history of breast and ovarian cancer, outlined below. No personal history of breast cancer, no previous breast biopsies. Is interested in information regarding her risk and risk reduction strategies.  No self detected breast problems or complaints. Last mammogram January 2013. Receives routine gynecologic care, has history of abnormal bleeding problem. Does not take hormone replacement therapy. Considering hysterectomy with bilateral salpingo-oohorectomies.   PREVIOUS BREAST/OVARIAN BIOPSIES: None  PREVIOUS PATHOLOGY:  None  PAST MEDICAL HISTORY:  Varicella, chronic back pain; fibroids; GBS carrier; Rh negative status during pregnancy; hypertension; iIrregular uterine bleeding; melanoma (2002); Monilial vaginitis (07/27/04); first trimester bleeding (2008); constipation in pregnancy (03/2006); AMA (advanced maternal age) multigravida 35+; H/O amenorrhea (10/2006); acute mastitis of left breast (10/04/2007), history superficial thrombophlebitis, left axilla. [name]  PAST SURGICAL HISTORY:  Past Surgical History  Procedure Date  . Wisdom tooth extraction 1989  . Appendectomy 2002  . Endocervical polyp removal 2001  . Melanoma removal  2003  . Uterine curettage 01/2009  . Endometrial ablation 2010  . Hysteroscopy w/ endometrial ablation 01/28/2009      ALLERGIES: No known allergies.  SOCIAL HISTORY: Never smoked, has never used smokeless tobacco. Does not drink alcohol or use illicit drugs.  HEALTH HABITS: Vitamins: Multivitamin Supplements: None Alternative Therapies: None Exercises regularly:   No  REPRODUCTIVE HISTORY:  Menarche age: 42 Gravida:  5     Para: 3 (2 miscarriages) First Live Birth: 30 Menses: Irregular, heavy.  Oral Contraceptives:  Yes, 10 years Menopause: natural/surgical  Age N/A HRT Y/N   GAIL MODEL RISK:  5 YR: 1.3% Lifetime: 18.9%  FAMILY HISTORY:   Father had bladder cancer and heart disease, died of lymphoma, maternal grandmother had breast cancer in her 46's, maternal great grandmother had cancer, unknown primary, mother had breast cancer age 104 BRCA- (tested over 10 years ago); maternal aunt had ovarian cancer in her 42's (BRCA-, tested 9 years ago).   HEALTH MAINTENANCE: Last mammogram: January 2013,normal.  Last clinical breast exam: 2 months ago Performs self breast exam: Yes Last Pap Smear: June, 2013 Colonoscopy: Never Last skin exam: 2 years ago.   REVIEW OF SYSTEMS:  General: Negative for fever, chills, night sweats,  loss of appetite or weight loss. HEENT: Negative for headaches, sore  throat, difficulty swallowing, blurred vision or problem with hearing or  sinus congestion. Respiratory: Negative for shortness of breath, cough  or dyspnea on exertion. Cardiovascular: Negative for chest pain,  palpitations or pedal edema. GI: Negative for nausea, vomiting,  diarrhea, constipation, change in bowel habits or blood in the stool.  No jaundice. GU: Negative for painful or frequent urination, change in  color of urine, or decreased urinary stream. Integumentary: Negative  for skin rashes. Numerous benign appearing nevi, no suspicious skin lesions.  Hematologic: Negative for easy bruisability or bleeding. Musculoskeletal: Has episodic  generalized myalgias. Negative for complaints of significant pain or arthritis.  Neurological/psychiatric: Negative for numbness, focal weakness,  balance problems or coordination difficulties. No depression or  mood swings.  Breast: No self detected abnormalities in the breast. No nipple discharge, masses  or  redness of the skin.   PHYSICAL EXAM: There were no vitals taken for this visit. GENERAL: Well developed, well nourished, in no acute distress.  EENT: No ocular or oral lesions. No stomatitis.  RESPIRATORY: Lungs are clear to auscultation bilaterally with normal respiratory movement and no accessory muscle use. CARDIAC: No murmur, rub or tachycardia. No upper or lower extremity edema.  GI: Abdomen is soft, no palpable hepatosplenomegaly. No fluid wave. No tenderness. Musculoskeletal: No kyphosis, no tenderness over the spine, ribs or hips. Lymph: No cervical, infraclavicular, axillary or inguinal adenopathy. Neuro: No focal neurological deficits. Psych: Alert and oriented X 3, appropriate mood and affect.  BREAST EXAM: In the supine position, with the right arm over the head, right nipple is everted. No periareolar edema or nipple discharge. No mass in any quadrant or subareolar region. No redness of the skin. No right axillary adenopathy. With the left arm over the head, left nipple is everted. No periareolar edema or nipple discharge. No mass in any quadrant or subareolar region. No redness of the skin. No left axillary adenopathy. Breasts are larger than anticipated for frame.   ASSESSMENT: 41 y/o female of [place] descent with:  1. Family history of breast and ovarian cancer, affected family members test negative for BRCA. These tests were performed 9-10 years ago.  2. Long-standing history of dysmenorrhea, considering hysterectomy, oophorectomy.  3. Dondra Spry model 5 yr risk 1.3% 4. Mammogram normal, Jan. 2013. No previous biopsies.  5. History superficial thrombophlebitis, left axilla.    PLAN:  1. Based on Gail 5 yr risk of 1.3%, according to NCCN guidelines, she is not a candidate for chemoprevention with Tamoxifen.  2. A history of thrombophlebitis would be a relative contraindication to Tamoxifen.  3. Based on the fact that her affected relatives genetic testing was 9-10 years ago,  would recommend retesting to determine if other mutations are present that were not being tested for 10 years ago. She agrees and an appt will be made for her mother with Maylon Cos, genetics counselor.  4. In the absence of genetic mutation or a lifetime risk greater than 20% (hers is 18.9%), we cannot justify screening with breast MRI. She needs a yearly mammogram and yearly clinical breast exam. She will discuss with Dr. Pennie Rushing to see if she is willing to accept the responsibility for these, or she is welcome to return yearly to the High Risk Clinic. Of course, if a mutation is found when we retest her mother, those recommendations for the patient will change.   DISCUSSION: She has a lot of questions regarding hormone replacement therapy and a good portion of the visit is spent in discussion regarding same. I explain the the use of HRT raises her risk of breast cancer by 20%, (her 5 yr risk would go from 1.3% to 1.5%, lifetime risk would increase from 18.9% to 22.6%. If she has an oophorectomy and hysterectomy and can avoid progesterone, according to the estrogen only arm of the [facility] trial, those risks may well be lower. With her history of thrombophlebitis, if she chooses to take HRT, I would recommend a transdermal patch to avoid pass-through of the liver and lower the risk of blood clot. That is a decision that will be made by the patient and Dr. Pennie Rushing.   A total of 105 minutes is spent with the patient. 95 minutes were spent counseling  regarding the above issues, reviewing NCCN guidelines for management of High Risk breast and ovarian cancer and making recommendations accordingly.

## 2011-08-11 ENCOUNTER — Other Ambulatory Visit: Payer: Self-pay | Admitting: Family

## 2011-08-24 ENCOUNTER — Encounter: Payer: Self-pay | Admitting: Family Medicine

## 2011-08-24 ENCOUNTER — Ambulatory Visit (INDEPENDENT_AMBULATORY_CARE_PROVIDER_SITE_OTHER): Payer: BC Managed Care – PPO | Admitting: Family Medicine

## 2011-08-24 VITALS — BP 120/78 | HR 78 | Temp 98.5°F | Ht 64.25 in | Wt 198.4 lb

## 2011-08-24 DIAGNOSIS — M797 Fibromyalgia: Secondary | ICD-10-CM

## 2011-08-24 DIAGNOSIS — Z8041 Family history of malignant neoplasm of ovary: Secondary | ICD-10-CM

## 2011-08-24 DIAGNOSIS — IMO0001 Reserved for inherently not codable concepts without codable children: Secondary | ICD-10-CM

## 2011-08-24 DIAGNOSIS — M2550 Pain in unspecified joint: Secondary | ICD-10-CM

## 2011-08-24 DIAGNOSIS — M549 Dorsalgia, unspecified: Secondary | ICD-10-CM

## 2011-08-24 MED ORDER — MELOXICAM 15 MG PO TABS: 15.0000 mg | ORAL_TABLET | Freq: Every day | ORAL | Status: DC

## 2011-08-24 NOTE — Progress Notes (Signed)
  Subjective:    Patient ID: [name], female    DOB: 10/17/1970, 41 y.o.   MRN: [id number]  HPI Fibromyalgia- pt stopped cymbalta 5 days after starting it due to side effects, 'it made me crazy'.  Offered her lower dose but she wasn't interested.  Never started Mobic.  Pt reports 'it doesn't hurt every day'.  Tried going gluten free but this made no difference.  + fatigue.  Back pain- pt considering breast reduction due to pain, impact on posture, deep divets in shoulders.  Aware that she needs/wants to lose weight but not sure how to start process for reduction  Family hx of breast/ovarian cancer- pt has had genetic testing.  Results pending.  Is considering TAH-BSO but is concerned about hormone replacement vs sudden menopause.  Wants my thoughts.   Review of Systems For ROS see HPI     Objective:   Physical Exam  Vitals reviewed. Constitutional: She appears well-developed and well-nourished. No distress.  Neck: Normal range of motion. Neck supple.  Musculoskeletal:       Deep dents in shoulders bilaterally under bra straps Slumped posture Bilateral trap and paraspinal spasm  Skin: Skin is warm and dry.  Psychiatric: She has a normal mood and affect. Her behavior is normal. Thought content normal.          Assessment & Plan:

## 2011-08-24 NOTE — Patient Instructions (Addendum)
Follow up in 3 months to see how things are going Start the Mobic daily for pain and inflammation Talk w/ Dr Pennie Rushing about the hormone replacement issues following surgical menopause Call with any quesitons or concerns Hang in there!!!

## 2011-08-28 DIAGNOSIS — Z8041 Family history of malignant neoplasm of ovary: Secondary | ICD-10-CM | POA: Insufficient documentation

## 2011-08-28 DIAGNOSIS — M549 Dorsalgia, unspecified: Secondary | ICD-10-CM | POA: Insufficient documentation

## 2011-08-28 NOTE — Assessment & Plan Note (Signed)
Unchanged.  Due to pt confusion she did not start daily Mobic.  Was not able to tolerate Cymbalta.  Still feel this is fibromyalgia as pt reports poor sleep and +/- depression.  Will continue to follow closely.

## 2011-08-28 NOTE — Assessment & Plan Note (Signed)
New.  Strong family hx of breast and ovarian cancer.  Pt has had genetic testing but these results are not available yet.  Had frank discussion w/ pt about prophylactically removing ovaries given family hx.  Pt to f/u w/ GYN regarding her final decision but pt is leaning towards TAH-BSO w/ hormone replacement.

## 2011-08-28 NOTE — Assessment & Plan Note (Signed)
New.  Pt was unable to tolerate Cymbalta.  Not interested in starting SSRI or SNRI at this time.  Prefers to start daily Mobic and see if sxs improve.  Will continue to follow.

## 2011-08-28 NOTE — Assessment & Plan Note (Signed)
New.  Likely multifactorial- overweight, large breasts, poor posture.  Start daily NSAIDs.  Will continue to follow and discuss possible breast reduction as option.

## 2011-08-30 ENCOUNTER — Encounter: Payer: BC Managed Care – PPO | Admitting: Obstetrics and Gynecology

## 2011-09-14 ENCOUNTER — Encounter: Payer: Self-pay | Admitting: Obstetrics and Gynecology

## 2011-09-14 ENCOUNTER — Ambulatory Visit (INDEPENDENT_AMBULATORY_CARE_PROVIDER_SITE_OTHER): Payer: BC Managed Care – PPO | Admitting: Obstetrics and Gynecology

## 2011-09-14 VITALS — BP 116/82 | Ht 64.0 in | Wt 199.0 lb

## 2011-09-14 DIAGNOSIS — N926 Irregular menstruation, unspecified: Secondary | ICD-10-CM

## 2011-09-14 DIAGNOSIS — N809 Endometriosis, unspecified: Secondary | ICD-10-CM | POA: Insufficient documentation

## 2011-09-14 DIAGNOSIS — N939 Abnormal uterine and vaginal bleeding, unspecified: Secondary | ICD-10-CM

## 2011-09-14 DIAGNOSIS — R102 Pelvic and perineal pain: Secondary | ICD-10-CM

## 2011-09-14 DIAGNOSIS — N949 Unspecified condition associated with female genital organs and menstrual cycle: Secondary | ICD-10-CM

## 2011-09-14 NOTE — Progress Notes (Signed)
Subjective: Pt is here to follow up from her appointment 4 weeks ago to discuss hysterectomy As definitive therapy for her persistent pelvic pain, abnormal uterine bleeding, and endometriosis.. Pt has had consult for genetic counseling at the cancer center with regard to her family history of breast cancer in her mother and maternal grandmother. She also has a maternal on who died of ovarian cancer. She has decided to undergo hysterectomy but is trying to make a decision about ovarian conservation or removal at the time of hysterectomy. She is concerned about ovarian cancer prevention because of her family history. I reviewed in detail with her the pros and cons of ovarian removal. I reviewed with her the date at that suggest increased risk of all cause mortality in women who undergo oophorectomy prior to age 60, especially if they decline hormone replacement therapy after surgery.  BP 116/82  Ht 5\' 4"  (1.626 m)  Wt 199 lb (90.266 kg)  BMI 34.16 kg/m2  Impression: Endometriosis Pelvic pain Abnormal uterine bleeding Status post endometrial ablation Family history of breast cancer in a first and second-degree relative Family history of ovarian cancer in a remote relative  Recommendation: A 25 minute discussion is held with the patient outlining the above concerns. She wants to defer her hysterectomy until October when school has started. She will return to visit with the genetic counselor  prior to that,and will consider further whether she wishes to undergo bilateral salpingo-oophorectomy at the time of hysterectomy. The indications, risks, and benefits associated with hysterectomy have been reviewed. I'm recommending that the patient consider a robotically assisted hysterectomy for the benefit of 3-D visualization and increased instrument dexterity in a patient with endometriosis.  She will followup in September for her annual evaluation at which time a Pap smear and endometrial biopsy will be  performed in anticipation of hysterectomy. I will try to send the patient additional information by mail if patient information concerning ovarian conservation becomes available.  She was given written information today concerning hysterectomy

## 2011-09-14 NOTE — Patient Instructions (Signed)
Hysterectomy Information  A hysterectomy is a procedure where your uterus is surgically removed. It will no longer be possible to have menstrual periods or to become pregnant. The tubes and ovaries can be removed (bilateral salpingo-oopherectomy) during this surgery as well.  REASONS FOR A HYSTERECTOMY  Persistent, abnormal bleeding.   Lasting (chronic) pelvic pain or infection.   The lining of the uterus (endometrium) starts growing outside the uterus (endometriosis).   The endometrium starts growing in the muscle of the uterus (adenomyosis).   The uterus falls down into the vagina (pelvic organ prolapse).   Symptomatic uterine fibroids.   Precancerous cells.   Cervical cancer or uterine cancer.  TYPES OF HYSTERECTOMIES  Supracervical hysterectomy. This type removes the top part of the uterus, but not the cervix.   Total hysterectomy. This type removes the uterus and cervix.   Radical hysterectomy. This type removes the uterus, cervix, and the fibrous tissue that holds the uterus in place in the pelvis (parametrium).  WAYS A HYSTERECTOMY CAN BE PERFORMED  Abdominal hysterectomy. A large surgical cut (incision) is made in the abdomen. The uterus is removed through this incision.   Vaginal hysterectomy. An incision is made in the vagina. The uterus is removed through this incision. There are no abdominal incisions.   Conventional laparoscopic hysterectomy. A thin, lighted tube with a camera (laparoscope) is inserted into 3 or 4 small incisions in the abdomen. The uterus is cut into small pieces. The small pieces are removed through the incisions, or they are removed through the vagina.   Laparoscopic assisted vaginal hysterectomy (LAVH). Three or four small incisions are made in the abdomen. Part of the surgery is performed laparoscopically and part vaginally. The uterus is removed through the vagina.   Robot-assisted laparoscopic hysterectomy. A laparoscope is inserted into 3 or 4  small incisions in the abdomen. A computer-controlled device is used to give the surgeon a 3D image. This allows for more precise movements of surgical instruments. The uterus is cut into small pieces and removed through the incisions or removed through the vagina.  RISKS OF HYSTERECTOMY   Bleeding and risk of blood transfusion. Tell your caregiver if you do not want to receive any blood products.   Blood clots in the legs or lung.   Infection.   Injury to surrounding organs.   Anesthesia problems or side effects.   Conversion to an abdominal hysterectomy.  WHAT TO EXPECT AFTER A HYSTERECTOMY  You will be given pain medicine.   You will need to have someone with you for the first 3 to 5 days after you go home.   You will need to follow up with your surgeon in 2 to 4 weeks after surgery to evaluate your progress.   You may have early menopause symptoms like hot flashes, night sweats, and insomnia.   If you had a hysterectomy for a problem that was not a cancer or a condition that could lead to cancer, then you no longer need Pap tests. However, even if you no longer need a Pap test, a regular exam is a good idea to make sure no other problems are starting.  Document Released: 08/10/2000 Document Revised: 02/03/2011 Document Reviewed: 09/25/2010 [facility] Patient Information 2012 [place], [place].Total Laparoscopic Hysterectomy A total laparoscopic hysterectomy is a minimally invasive surgery to remove your uterus and cervix. This surgery is performed by making several small cuts (incisions) in your abdomen. It can also be done with a thin, lighted tube (laparoscope) inserted into 2  small incisions in the lower abdomen. Your fallopian tubes and ovaries can be removed (bilateral salpingo-oopherectomy) during this surgery as well.If a total laparoscopic hysterectomy is started and it is not safe to continue, the laparoscopic surgery will be converted to an open abdominal surgery. You will not  have menstrual periods or be able to get pregnant after having this surgery. If a bilateral salpingo-oopherectomy was performed before menopause, you will go through a sudden (abrupt) menopause. This can be helped with hormone medicines. Benefits of minimally invasive surgery include:  Less pain.   Less risk of blood loss.   Less risk of infection.   Quicker return to normal activities.   Usually a 1 night stay in the hospital.   Overall patient satisfaction.  LET YOUR CAREGIVER KNOW ABOUT:  Any history of abnormal Pap tests.   Allergies to food or medicine.   Medicines taken, including vitamins, herbs, eyedrops, over-the-counter medicines, and creams.   Use of steroids (by mouth or creams).   Previous problems with anesthetics or numbing medicines.   History of bleeding problems or blood clots.   Previous surgery.   Other health problems, including diabetes and kidney problems.   Desire for future fertility.   Any infections or colds you may have developed.   Symptoms of irregular or heavy periods, weight loss, or urinary or bowel changes.  RISKS AND COMPLICATIONS   Bleeding.   Blood clots in the legs or lung.   Infection.   Injury to surrounding organs.   Problems with anesthesia.   Early menopause symptoms (hot flashes, night sweats, insomnia).   Risk of conversion to an open abdominal incision.  BEFORE THE PROCEDURE  Ask your caregiver about changing or stopping your regular medicines.   Do not take aspirin or blood thinners (anticoagulants) for 1 week before the surgery, or as told by your caregiver.   Do not eat or drink anything for 8 hours before the surgery, or as told by your caregiver.   Quit smoking if you smoke.   Arrange for a ride home after surgery and for someone to help you at home during recovery.  PROCEDURE   You will be given antibiotic medicine.   An intravenous (IV) line will be placed in your arm. You will be given medicine to  make you sleep (general anesthetic).   A gas (carbon dioxide) will be used to inflate your abdomen. This will allow your surgeon to look inside your abdomen, perform your surgery, and treat any other problems found if necessary.   Three or four small incisions (often less than  inch) will be made in your abdomen. One of these incisions will be made in the area of your belly button (navel). The laparoscope will be inserted into the incision. Your surgeon will look through the laparoscope while doing your procedure.   Other surgical instruments will be inserted through the other incisions.   The uterus may be removed through the vagina or cut into small pieces and removed through the small incisions.   Your incisions will be closed.  AFTER THE PROCEDURE  The gas will be released from inside your abdomen.   You will be taken to the recovery area where a nurse will watch and check your progress. Once you are awake, stable, and taking fluids well, without other problems, you will return to your room or be allowed to go home.   There is usually minimal discomfort following the surgery because the incisions are so small.  You will be given pain medicine while you are in the hospital and for when you go home.   Try to have someone with you the first 3 to 5 days after you go home.   Follow up with your surgeon in 2 to 4 weeks after surgery to evaluate your progress.  Document Released: 12/12/2006 Document Revised: 02/03/2011 Document Reviewed: 10/01/2010 [facility] Patient Information 2012 [place], [place].

## 2011-09-30 ENCOUNTER — Telehealth: Payer: Self-pay | Admitting: Obstetrics and Gynecology

## 2011-10-26 ENCOUNTER — Other Ambulatory Visit: Payer: Self-pay | Admitting: Obstetrics and Gynecology

## 2011-10-26 ENCOUNTER — Telehealth: Payer: Self-pay | Admitting: Obstetrics and Gynecology

## 2011-10-26 NOTE — Telephone Encounter (Signed)
Robotic Hysterectomy possible BSO scheduled for 12/13/11 @ 12:00 with [facility].  [employer] effective 03/01/11.  Plan pays 100% after a $1,000 deductible.  Pre-op due $500.  -Adrianne Pridgen

## 2011-11-16 ENCOUNTER — Ambulatory Visit (INDEPENDENT_AMBULATORY_CARE_PROVIDER_SITE_OTHER): Payer: BC Managed Care – PPO | Admitting: Obstetrics and Gynecology

## 2011-11-16 ENCOUNTER — Ambulatory Visit (INDEPENDENT_AMBULATORY_CARE_PROVIDER_SITE_OTHER): Payer: BC Managed Care – PPO

## 2011-11-16 ENCOUNTER — Telehealth: Payer: Self-pay | Admitting: Obstetrics and Gynecology

## 2011-11-16 ENCOUNTER — Encounter: Payer: Self-pay | Admitting: Obstetrics and Gynecology

## 2011-11-16 VITALS — BP 116/70 | Ht 65.0 in | Wt 203.0 lb

## 2011-11-16 DIAGNOSIS — N939 Abnormal uterine and vaginal bleeding, unspecified: Secondary | ICD-10-CM

## 2011-11-16 DIAGNOSIS — N926 Irregular menstruation, unspecified: Secondary | ICD-10-CM

## 2011-11-16 NOTE — Telephone Encounter (Signed)
Robotic Hysterectomy possible BSO rescheduled to 12/08/11 @ 12:00 with [facility].  [employer] effective 03/01/11.  Plan pays 100% after a $1,000 deductible.  Pre-op due $500.  -Adrianne Pridgen

## 2011-11-16 NOTE — Progress Notes (Deleted)
  ULTRASOUND: Uterus: Length: 7.70 cm   Width:  5.48 cm   Height:  3.96 cm  Endo thickness:  5.46 mm   Left ovary:Normal   Length: 2.99 cm    Width: 1.65 cm   Height: 1.39 cm Right ovary:Normal   Length: 4.16 cm   Width: 3.03 cm   Height: 2.82 cm.  Fibroids:no   CDS fluid:no  Comment: Anteverted uterus, normal endometrium, normal ovaries, no fluid in CDS, normal adnexa's RTOV simple cyst = 2.7x2.4x2.7 cm

## 2011-11-16 NOTE — Progress Notes (Signed)
F/U VISIT  SUBJECTIVE:  Preparing for hysterectomy/oophorectomy for endometriosis, abnl uterine bleeding, pelvic pain.  Has been bleeding this time for almost a month  OBJECTIVE:BP 116/70  Ht 5\' 5"  (1.651 m)  Wt 203 lb (92.08 kg)  BMI 33.78 kg/m2   ENDOMETRIAL BIOPSY   800mg  ibuprofen given   INDICATION: dysfunctional uterine bleeding  LMP: pt unsure of 1st day UPT: Negative   Risks and benefits have been discussed  PROCEDURE Cervix prepped with betadine.  Point bleeding at 5 o'clock noted and treated with Monsel's Tenaculum applied to anterior lip of the cervix: yes Cervix stenotic and os finder used Pipelle inserted but uterine fundus not identified so measurement not possible.  Aspiration yielded only a small amt of bloody fluid Well tolerated  Specimen appropriately identified and sent to pathology, though pt notified that there probably was no endometrial tissue in the sample  ULTRASOUND: Uterus: Length: 7.70 cm   Width:  5.48 cm   Height:  3.96 cm  Endo thickness:  5.46 mm   Left ovary:Normal   Length: 2.99 cm    Width: 1.65 cm   Height: 1.39 cm Right ovary:Normal   Length: 4.16 cm   Width: 3.03 cm   Height: 2.82 cm.  Fibroids:no   CDS fluid:no  Comment: Anteverted uterus, normal endometrium, normal ovaries, no fluid in CDS, normal adnexa's RTOV simple cyst = 2.7x2.4x2.7 cm   ASSESSMENT: Abnl uterine bleeding Hx of endometrial ablation 2010 for same Hx endometriosis noted at time of BTL Family hx ovarian ca Probable asymptomatic uterine perforation from os finder without immediate sequalae No evidence of endometrial hyperplasia on [place]  RECOMMENDATION: Pap for abnl utx bleeding Reviewed all findings with pt including rationale for [place] evaluation. She has decided to have BSO as well as hyst.  This is currently scheduled as a robotic procedure, but she understands the inability to access the endometrial cavity may prohibit that approach. Alternatives  may be LAVH BSO or TAHBSO. F/U 11/28/2101 for pre-op.   Dierdre Forth MD 11/16/2011 9:46 AM

## 2011-11-18 LAB — PATHOLOGY

## 2011-11-18 LAB — PAP IG W/ RFLX HPV ASCU

## 2011-11-21 LAB — US OB TRANSVAGINAL

## 2011-11-25 ENCOUNTER — Encounter (HOSPITAL_COMMUNITY): Payer: Self-pay

## 2011-11-29 ENCOUNTER — Encounter: Payer: BC Managed Care – PPO | Admitting: Obstetrics and Gynecology

## 2011-11-29 ENCOUNTER — Encounter: Payer: Self-pay | Admitting: Obstetrics and Gynecology

## 2011-11-29 ENCOUNTER — Ambulatory Visit (INDEPENDENT_AMBULATORY_CARE_PROVIDER_SITE_OTHER): Payer: BC Managed Care – PPO | Admitting: Obstetrics and Gynecology

## 2011-11-29 VITALS — BP 122/80 | HR 80 | Temp 98.4°F | Resp 16 | Ht 64.0 in | Wt 204.0 lb

## 2011-11-29 DIAGNOSIS — Z01818 Encounter for other preprocedural examination: Secondary | ICD-10-CM

## 2011-11-29 MED ORDER — OXYCODONE-ACETAMINOPHEN 5-325 MG PO TABS: 1.0000 | ORAL_TABLET | ORAL | Status: DC | PRN

## 2011-11-29 MED ORDER — IBUPROFEN 600 MG PO TABS: 600.0000 mg | ORAL_TABLET | Freq: Four times a day (QID) | ORAL | Status: DC | PRN

## 2011-11-29 NOTE — Progress Notes (Signed)
[name] is a 41 y.o. female [phone] who presents for hysterectomy with bilateral salpingo-oophorectomy because of dysfunctional uterine bleeding. For over 4 years patient has had almost daily bleeding,  ranging from spotting to the saturation of  a pad resulting in her clothes being soiled. The patient recalls bleeding continuously for 8 weeks,  but most of the time she would have bleeding off and on every few days.  She was given Aygestin initially  for management, that she never took,  and in 2010 she underwent an endometrial ablation.  This procedure eliminated the heavy bleeding she was experiencing but not the persistent irregular pattern. At the time of her ablation,  laparoscopic tubal sterilization was performed and it was discovered that patient had endometriosis.   She admits to cramps,  rated at 3/10 on a 10 point pain scale but denies urinary tract or vaginitis symptoms, dyspareunia or change in bowel movements. A pelvic ultrasound September 2013 showed: uterus-7.70 x 5.48 x 5.46 cm and ovaries: Left 2.99 x 1.65 x 1.39 cm, Right 4.16 x 3.03 x 2.82 cm with a simple right ovarian cyst  2.7 x 2.4 x 2.7 cm.  Patient's endometrium measured 5.46 mm.  After reviewing both medical and surgical management options, to include observation, patient has decided to proceed with definitive treatment in the form of hysterectomy.  Patient also requests that her ovaries be removed given her family history of ovarian cancer.  She is aware that this will place her immediately into menopause.   Past Medical History  OB History: [id number] Spontaneous vaginal births, 2002, 2004 and 2009.  GYN History: menarche: 41 YO   LMP: see HPI,     Contracepton: Tubal Sterilzation,   The patient denies history of sexually transmitted disease.  Denies history of abnormal PAP smear  Last PAP smear September 2012  Medical History: endometriosis, hemorrhoids, hypertension, left leg melanoma, left wrist tendinitis, herniated  lumbar disc, cervical polyp,  left knee  medial meniscus damage, right shoulder joint pain, seasonal allergies, episodic achiness (being worked up for fibromyalgia) and right hand joint pain.  Surgical History:  2002 Appendectomy;  2008 D & C,  2010  Thermachoice Endometrial Ablation/Tubal Sterilization (diagnosed with endometriosis), Denies problems with anesthesia though difficulty to awaken. 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 Beclomethasone Dipropionate (QNASL) 80 MCG/ACT AERS Place 2 Squirts into the nose daily. Each nostril      . Fexofenadine HCl (ALLEGRA PO) Take 1 tablet by mouth daily. otc      . ibuprofen (ADVIL,MOTRIN) 200 MG tablet Take 400-800 mg by mouth 2 (two) times daily as needed. headache      . montelukast (SINGULAIR) 10 MG tablet Take 10 mg by mouth at bedtime.      . multivitamin (THERAGRAN) per tablet Take 1 tablet by mouth daily.          Allergies  Allergen Reactions  . Betadine (Povidone Iodine)     Dx with allergy testing  . Cymbalta (Duloxetine Hcl)     Makes her feel crazy, out of body  . Shellfish Allergy     Dx with allergy testing   ROS: Admits to headaches caused  by allergies seasonal allergies, knee, hand and shoulder arthralgias (being evaluated for fibromyalgia) but denies   vision changes, nasal congestion, dysphagia, tinnitus, dizziness, hoarseness, cough,  chest pain, shortness of breath, nausea, vomiting, diarrhea,constipation,  urinary frequency, urgency  dysuria, hematuria, vaginitis symptoms, pelvic pain, swelling of joints,easy bruising,  myalgias, skin rashes,  unexplained weight loss and except as is mentioned in the history of present illness, patient's review of systems is otherwise negative.  Physical Exam    BP 122/80  Pulse 80  Temp 98.4 F (36.9 C) (Oral)  Resp 16  Ht 5\' 4"  (1.626 m)  Wt 204 lb (92.534 kg)  BMI 35.02 kg/m2  Neck: supple without masses or thyromegaly Lungs: clear to auscultation Heart: regular rate and rhythm Abdomen: soft, non-tender and no organomegaly Pelvic:EGBUS- wnl; vagina-normal rugae; uterus-normal size, cervix without lesions or motion tenderness; adnexae-no tenderness or masses Extremities:  no clubbing, cyanosis or edema  Endometrial biopsy was attempted at 11/16/11 visit but due to cervical stenosis was not able to be completed.  Given that a subsequent ultrasound showed an endometrium measuring 5. 46 mm it unlikely that an  endometrial malignancy is present.   Assesment:  Dysfunctional Uterine Bleeding                       Endometriosis                       S/P Thermachoice Endometrial Ablation                       Family History of Ovarian Cancer   Disposition:  The patient was given the indication for her procedure(s) along with the risks, which include but are not limited to, reaction to anesthesia, damage to adjacent organs, Infection, excessive bleeding, formation of scar tissue, pelvic prolapse and the possible need for an open abdominal incision. She was further advised that she will experience transient post operative facial edema, that her hospital stay is expected to be 0-2 days, she should be able to return to her usual activities within 2-3 weeks (except intercourse,  to be delayed until 6 weeks) and that the robotic approach to her surgery requires more time to perform than an open abdominal approach.  Patient was given the Miralax bowel prep to be completed 24 hours prior to procedure.  She verbalized understanding of these risks and pre-operative instructions and has consented to proceed  with a Robot Assisted Total Laparoscopic Hysterectomy with Bilateral Salpingo-oophorectomy with a possible Laparoscopically Assisted Vaginal Hysterectomy or Open Abdominal Hysterectomy at [facility], December 08, 2011 at 12 noon.   CSN# [id number]   Vasiliy Mccarry J. Lowell Guitar, PA-C  for Dr. Maris Berger. Haygood

## 2011-11-30 ENCOUNTER — Encounter: Payer: BC Managed Care – PPO | Admitting: Obstetrics and Gynecology

## 2011-12-02 ENCOUNTER — Telehealth: Payer: Self-pay

## 2011-12-02 ENCOUNTER — Telehealth: Payer: Self-pay | Admitting: Obstetrics and Gynecology

## 2011-12-02 NOTE — Telephone Encounter (Signed)
LM FOR PT TO CALL BACK. 

## 2011-12-02 NOTE — Telephone Encounter (Signed)
TC TO PT REGARDING MESSAGE. PT WANTED TO KNOW COULD SHE TAKE IBUPROFEN FOR HEADACHES PRIOR TO SURGERY.PT STATES THAT HEADACHES ARE COMMON FOR HER. INFORMED PT THAT SHE CAN TAKE IBUPROFEN BUT IF HEADACHES PERSIST;SHE NEED TO CALL PCP AND SEE WHAT HE RECOMMENDS.PT VOICED UNDERSTANDING.

## 2011-12-05 ENCOUNTER — Encounter (HOSPITAL_COMMUNITY): Payer: Self-pay

## 2011-12-05 ENCOUNTER — Encounter (HOSPITAL_COMMUNITY)
Admission: RE | Admit: 2011-12-05 | Discharge: 2011-12-05 | Disposition: A | Discharge status: Home / Self Care | Payer: BC Managed Care – PPO | Source: Ambulatory Visit | Attending: Obstetrics and Gynecology | Admitting: Obstetrics and Gynecology

## 2011-12-05 HISTORY — DX: Fibromyalgia: M79.7

## 2011-12-05 LAB — CBC
MCH: 29.3 pg (ref 26.0–34.0)
Platelets: 304 10*3/uL (ref 150–400)
RBC: 4.54 MIL/uL (ref 3.87–5.11)
RDW: 12.8 % (ref 11.5–15.5)
WBC: 6.8 10*3/uL (ref 4.0–10.5)

## 2011-12-05 LAB — SURGICAL PCR SCREEN: MRSA, PCR: NEGATIVE

## 2011-12-05 NOTE — Patient Instructions (Addendum)
20 EDYE MOHABIR  12/05/2011   Your procedure is scheduled on:  12/08/11  Enter through the Main Entrance of [facility] at 1030 AM.  Pick up the phone at the desk and dial [phone].   Call this number if you have problems the morning of surgery: [phone]   Remember:   Do not eat food:After Midnight.  Do not drink clear liquids: after 6AM day of surgery due to bowel prep  Take these medicines the morning of surgery with A SIP OF WATER:  May use nasal spray   Do not wear jewelry, make-up or nail polish.  Do not wear lotions, powders, or perfumes. You may wear deodorant.  Do not shave 48 hours prior to surgery.  Do not bring valuables to the hospital.  Contacts, dentures or bridgework may not be worn into surgery.  Leave suitcase in the car. After surgery it may be brought to your room.  For patients admitted to the hospital, checkout time is 11:00 AM the day of discharge.   Patients discharged the day of surgery will not be allowed to drive home.  Name and phone number of your driver: na  Special Instructions: Shower using CHG 2 nights before surgery and the night before surgery.  If you shower the day of surgery use CHG.  Use special wash - you have one bottle of CHG for all showers.  You should use approximately 1/3 of the bottle for each shower.   Please read over the following fact sheets that you were given: MRSA Information

## 2011-12-07 MED ORDER — DEXTROSE 5 % IV SOLN
2.0000 g | INTRAVENOUS | Status: AC
  Administered 2011-12-08: 2 g via INTRAVENOUS
  Filled 2011-12-07: qty 2

## 2011-12-08 ENCOUNTER — Encounter (HOSPITAL_COMMUNITY): Payer: Self-pay | Admitting: Anesthesiology

## 2011-12-08 ENCOUNTER — Ambulatory Visit (HOSPITAL_COMMUNITY): Payer: BC Managed Care – PPO | Admitting: Anesthesiology

## 2011-12-08 ENCOUNTER — Encounter (HOSPITAL_COMMUNITY): Payer: Self-pay | Admitting: Obstetrics and Gynecology

## 2011-12-08 ENCOUNTER — Encounter (HOSPITAL_COMMUNITY)
Admission: RE | Disposition: A | Discharge status: Home / Self Care | Payer: Self-pay | Source: Ambulatory Visit | Attending: Obstetrics and Gynecology

## 2011-12-08 ENCOUNTER — Ambulatory Visit (HOSPITAL_COMMUNITY)
Admission: RE | Admit: 2011-12-08 | Discharge: 2011-12-09 | Disposition: A | Discharge status: Home / Self Care | Payer: BC Managed Care – PPO | Source: Ambulatory Visit | Attending: Obstetrics and Gynecology | Admitting: Obstetrics and Gynecology

## 2011-12-08 DIAGNOSIS — R000 Tachycardia, unspecified: Secondary | ICD-10-CM | POA: Insufficient documentation

## 2011-12-08 DIAGNOSIS — Z01812 Encounter for preprocedural laboratory examination: Secondary | ICD-10-CM | POA: Insufficient documentation

## 2011-12-08 DIAGNOSIS — D252 Subserosal leiomyoma of uterus: Secondary | ICD-10-CM | POA: Insufficient documentation

## 2011-12-08 DIAGNOSIS — Z90710 Acquired absence of both cervix and uterus: Secondary | ICD-10-CM

## 2011-12-08 DIAGNOSIS — N938 Other specified abnormal uterine and vaginal bleeding: Secondary | ICD-10-CM | POA: Insufficient documentation

## 2011-12-08 DIAGNOSIS — Z01818 Encounter for other preprocedural examination: Secondary | ICD-10-CM | POA: Insufficient documentation

## 2011-12-08 DIAGNOSIS — Z8041 Family history of malignant neoplasm of ovary: Secondary | ICD-10-CM | POA: Insufficient documentation

## 2011-12-08 DIAGNOSIS — I10 Essential (primary) hypertension: Secondary | ICD-10-CM | POA: Insufficient documentation

## 2011-12-08 DIAGNOSIS — D259 Leiomyoma of uterus, unspecified: Secondary | ICD-10-CM

## 2011-12-08 DIAGNOSIS — N809 Endometriosis, unspecified: Secondary | ICD-10-CM

## 2011-12-08 DIAGNOSIS — N949 Unspecified condition associated with female genital organs and menstrual cycle: Secondary | ICD-10-CM | POA: Insufficient documentation

## 2011-12-08 HISTORY — PX: SALPINGOOPHORECTOMY: SHX82

## 2011-12-08 LAB — PREGNANCY, URINE: Preg Test, Ur: NEGATIVE

## 2011-12-08 SURGERY — ROBOTIC ASSISTED TOTAL HYSTERECTOMY
Anesthesia: General | Laterality: Bilateral | Wound class: Clean Contaminated

## 2011-12-08 MED ORDER — LACTATED RINGERS IV SOLN
INTRAVENOUS | Status: DC
  Administered 2011-12-08: 22:00:00 via INTRAVENOUS

## 2011-12-08 MED ORDER — GLYCOPYRROLATE 0.2 MG/ML IJ SOLN
INTRAMUSCULAR | Status: AC
  Filled 2011-12-08: qty 2

## 2011-12-08 MED ORDER — ROCURONIUM BROMIDE 50 MG/5ML IV SOLN
INTRAVENOUS | Status: AC
  Filled 2011-12-08: qty 1

## 2011-12-08 MED ORDER — HYDROMORPHONE HCL PF 1 MG/ML IJ SOLN
0.2500 mg | INTRAMUSCULAR | Status: DC | PRN
  Administered 2011-12-08 (×2): 0.5 mg via INTRAVENOUS

## 2011-12-08 MED ORDER — BUPIVACAINE HCL (PF) 0.25 % IJ SOLN
INTRAMUSCULAR | Status: AC
  Filled 2011-12-08: qty 30

## 2011-12-08 MED ORDER — LACTATED RINGERS IV SOLN: INTRAVENOUS | Status: DC

## 2011-12-08 MED ORDER — IBUPROFEN 600 MG PO TABS
600.0000 mg | ORAL_TABLET | Freq: Four times a day (QID) | ORAL | Status: DC | PRN
  Administered 2011-12-09: 600 mg via ORAL
  Filled 2011-12-08: qty 1

## 2011-12-08 MED ORDER — LIDOCAINE HCL (CARDIAC) 20 MG/ML IV SOLN
INTRAVENOUS | Status: AC
  Filled 2011-12-08: qty 5

## 2011-12-08 MED ORDER — MONTELUKAST SODIUM 10 MG PO TABS
10.0000 mg | ORAL_TABLET | Freq: Every day | ORAL | Status: DC
  Administered 2011-12-08: 10 mg via ORAL
  Filled 2011-12-08 (×2): qty 1

## 2011-12-08 MED ORDER — DEXAMETHASONE SODIUM PHOSPHATE 4 MG/ML IJ SOLN
INTRAMUSCULAR | Status: DC | PRN
  Administered 2011-12-08: 10 mg via INTRAVENOUS

## 2011-12-08 MED ORDER — MENTHOL 3 MG MT LOZG: 1.0000 | LOZENGE | OROMUCOSAL | Status: DC | PRN

## 2011-12-08 MED ORDER — SCOPOLAMINE 1 MG/3DAYS TD PT72
MEDICATED_PATCH | TRANSDERMAL | Status: AC
  Administered 2011-12-08: 1.5 mg via TRANSDERMAL
  Filled 2011-12-08: qty 1

## 2011-12-08 MED ORDER — LACTATED RINGERS IR SOLN
Status: DC | PRN
  Administered 2011-12-08: 3000 mL

## 2011-12-08 MED ORDER — NEOSTIGMINE METHYLSULFATE 1 MG/ML IJ SOLN
INTRAMUSCULAR | Status: AC
  Filled 2011-12-08: qty 10

## 2011-12-08 MED ORDER — HYDROMORPHONE HCL PF 1 MG/ML IJ SOLN
INTRAMUSCULAR | Status: DC | PRN
  Administered 2011-12-08 (×2): 0.5 mg via INTRAVENOUS

## 2011-12-08 MED ORDER — HYDROMORPHONE HCL PF 1 MG/ML IJ SOLN
INTRAMUSCULAR | Status: AC
  Filled 2011-12-08: qty 1

## 2011-12-08 MED ORDER — FENTANYL CITRATE 0.05 MG/ML IJ SOLN
INTRAMUSCULAR | Status: AC
  Filled 2011-12-08: qty 2

## 2011-12-08 MED ORDER — ARTIFICIAL TEARS OP OINT
TOPICAL_OINTMENT | OPHTHALMIC | Status: AC
  Filled 2011-12-08: qty 3.5

## 2011-12-08 MED ORDER — KETOROLAC TROMETHAMINE 30 MG/ML IJ SOLN
15.0000 mg | Freq: Once | INTRAMUSCULAR | Status: AC | PRN
  Administered 2011-12-08: 30 mg via INTRAVENOUS

## 2011-12-08 MED ORDER — HYDROMORPHONE HCL PF 1 MG/ML IJ SOLN
INTRAMUSCULAR | Status: AC
  Administered 2011-12-08: 0.5 mg via INTRAVENOUS
  Filled 2011-12-08: qty 1

## 2011-12-08 MED ORDER — GLYCOPYRROLATE 0.2 MG/ML IJ SOLN
INTRAMUSCULAR | Status: AC
  Filled 2011-12-08: qty 1

## 2011-12-08 MED ORDER — NEOSTIGMINE METHYLSULFATE 1 MG/ML IJ SOLN
INTRAMUSCULAR | Status: DC | PRN
  Administered 2011-12-08: 5 mg via INTRAVENOUS

## 2011-12-08 MED ORDER — ONDANSETRON HCL 4 MG/2ML IJ SOLN
INTRAMUSCULAR | Status: DC | PRN
  Administered 2011-12-08: 4 mg via INTRAVENOUS

## 2011-12-08 MED ORDER — BUPIVACAINE HCL (PF) 0.25 % IJ SOLN
INTRAMUSCULAR | Status: DC | PRN
  Administered 2011-12-08: 18 mL

## 2011-12-08 MED ORDER — MEPERIDINE HCL 25 MG/ML IJ SOLN: 6.2500 mg | INTRAMUSCULAR | Status: DC | PRN

## 2011-12-08 MED ORDER — LIDOCAINE HCL (CARDIAC) 20 MG/ML IV SOLN
INTRAVENOUS | Status: DC | PRN
  Administered 2011-12-08: 30 mg via INTRAVENOUS
  Administered 2011-12-08: 50 mg via INTRAVENOUS

## 2011-12-08 MED ORDER — MIDAZOLAM HCL 2 MG/2ML IJ SOLN
INTRAMUSCULAR | Status: AC
  Filled 2011-12-08: qty 2

## 2011-12-08 MED ORDER — KETOROLAC TROMETHAMINE 30 MG/ML IJ SOLN
INTRAMUSCULAR | Status: AC
  Administered 2011-12-08: 30 mg via INTRAVENOUS
  Filled 2011-12-08: qty 1

## 2011-12-08 MED ORDER — PROPOFOL 10 MG/ML IV EMUL
INTRAVENOUS | Status: AC
  Filled 2011-12-08: qty 20

## 2011-12-08 MED ORDER — MIDAZOLAM HCL 5 MG/5ML IJ SOLN
INTRAMUSCULAR | Status: DC | PRN
  Administered 2011-12-08: 2 mg via INTRAVENOUS

## 2011-12-08 MED ORDER — OXYCODONE-ACETAMINOPHEN 5-325 MG PO TABS
1.0000 | ORAL_TABLET | ORAL | Status: DC | PRN
  Administered 2011-12-09: 1 via ORAL
  Filled 2011-12-08: qty 1

## 2011-12-08 MED ORDER — LACTATED RINGERS IV SOLN
INTRAVENOUS | Status: DC
  Administered 2011-12-08 (×3): via INTRAVENOUS

## 2011-12-08 MED ORDER — ROCURONIUM BROMIDE 100 MG/10ML IV SOLN
INTRAVENOUS | Status: DC | PRN
  Administered 2011-12-08: 30 mg via INTRAVENOUS
  Administered 2011-12-08: 20 mg via INTRAVENOUS
  Administered 2011-12-08: 40 mg via INTRAVENOUS
  Administered 2011-12-08: 30 mg via INTRAVENOUS

## 2011-12-08 MED ORDER — DEXAMETHASONE SODIUM PHOSPHATE 10 MG/ML IJ SOLN
INTRAMUSCULAR | Status: AC
  Filled 2011-12-08: qty 1

## 2011-12-08 MED ORDER — PROPOFOL 10 MG/ML IV EMUL
INTRAVENOUS | Status: DC | PRN
  Administered 2011-12-08: 200 mg via INTRAVENOUS

## 2011-12-08 MED ORDER — KETOROLAC TROMETHAMINE 30 MG/ML IJ SOLN
30.0000 mg | Freq: Four times a day (QID) | INTRAMUSCULAR | Status: DC
  Administered 2011-12-08: 30 mg via INTRAVENOUS
  Filled 2011-12-08: qty 1

## 2011-12-08 MED ORDER — GLYCOPYRROLATE 0.2 MG/ML IJ SOLN
INTRAMUSCULAR | Status: DC | PRN
  Administered 2011-12-08: 1 mg via INTRAVENOUS

## 2011-12-08 MED ORDER — SCOPOLAMINE 1 MG/3DAYS TD PT72
1.0000 | MEDICATED_PATCH | TRANSDERMAL | Status: DC
  Administered 2011-12-08: 1.5 mg via TRANSDERMAL

## 2011-12-08 MED ORDER — PROMETHAZINE HCL 25 MG/ML IJ SOLN: 6.2500 mg | INTRAMUSCULAR | Status: DC | PRN

## 2011-12-08 MED ORDER — MIDAZOLAM HCL 2 MG/2ML IJ SOLN: 0.5000 mg | Freq: Once | INTRAMUSCULAR | Status: DC | PRN

## 2011-12-08 MED ORDER — FENTANYL CITRATE 0.05 MG/ML IJ SOLN
INTRAMUSCULAR | Status: DC | PRN
  Administered 2011-12-08: 100 ug via INTRAVENOUS
  Administered 2011-12-08 (×3): 50 ug via INTRAVENOUS
  Administered 2011-12-08: 100 ug via INTRAVENOUS
  Administered 2011-12-08 (×2): 50 ug via INTRAVENOUS

## 2011-12-08 MED ORDER — FENTANYL CITRATE 0.05 MG/ML IJ SOLN
INTRAMUSCULAR | Status: AC
  Filled 2011-12-08: qty 5

## 2011-12-08 MED ORDER — KETOROLAC TROMETHAMINE 30 MG/ML IJ SOLN
INTRAMUSCULAR | Status: AC
  Filled 2011-12-08: qty 1

## 2011-12-08 MED ORDER — ONDANSETRON HCL 4 MG/2ML IJ SOLN
INTRAMUSCULAR | Status: AC
  Filled 2011-12-08: qty 2

## 2011-12-08 SURGICAL SUPPLY — 65 items
BAG URINE DRAINAGE (UROLOGICAL SUPPLIES) ×2 IMPLANT
BARRIER ADHS 3X4 INTERCEED (GAUZE/BANDAGES/DRESSINGS) IMPLANT
BENZOIN TINCTURE PRP APPL 2/3 (GAUZE/BANDAGES/DRESSINGS) ×2 IMPLANT
CABLE HIGH FREQUENCY MONO STRZ (ELECTRODE) ×2 IMPLANT
CATH FOLEY 3WAY  5CC 16FR (CATHETERS) ×1
CATH FOLEY 3WAY 5CC 16FR (CATHETERS) ×1 IMPLANT
CHLORAPREP W/TINT 26ML (MISCELLANEOUS) ×2 IMPLANT
CLOTH BEACON ORANGE TIMEOUT ST (SAFETY) ×2 IMPLANT
CONT PATH 16OZ SNAP LID 3702 (MISCELLANEOUS) ×2 IMPLANT
COVER MAYO STAND STRL (DRAPES) ×2 IMPLANT
COVER TABLE BACK 60X90 (DRAPES) ×4 IMPLANT
COVER TIP SHEARS 8 DVNC (MISCELLANEOUS) ×1 IMPLANT
COVER TIP SHEARS 8MM DA VINCI (MISCELLANEOUS) ×1
DECANTER SPIKE VIAL GLASS SM (MISCELLANEOUS) ×2 IMPLANT
DERMABOND ADVANCED (GAUZE/BANDAGES/DRESSINGS) ×1
DERMABOND ADVANCED .7 DNX12 (GAUZE/BANDAGES/DRESSINGS) ×1 IMPLANT
DRAPE HUG U DISPOSABLE (DRAPE) ×2 IMPLANT
DRAPE LG THREE QUARTER DISP (DRAPES) ×4 IMPLANT
DRAPE SURG 17X23 STRL (DRAPES) ×2 IMPLANT
DRAPE WARM FLUID 44X44 (DRAPE) ×2 IMPLANT
ELECT REM PT RETURN 9FT ADLT (ELECTROSURGICAL) ×2
ELECTRODE REM PT RTRN 9FT ADLT (ELECTROSURGICAL) ×1 IMPLANT
EVACUATOR SMOKE 8.L (FILTER) ×2 IMPLANT
GAUZE VASELINE 3X9 (GAUZE/BANDAGES/DRESSINGS) IMPLANT
GLOVE BIO SURGEON STRL SZ 6.5 (GLOVE) ×4 IMPLANT
GLOVE ECLIPSE 6.5 STRL STRAW (GLOVE) ×6 IMPLANT
GLOVE SURG SS PI 6.5 STRL IVOR (GLOVE) ×6 IMPLANT
GOWN STRL REIN XL XLG (GOWN DISPOSABLE) ×12 IMPLANT
KIT ACCESSORY DA VINCI DISP (KITS) ×1
KIT ACCESSORY DVNC DISP (KITS) ×1 IMPLANT
KIT DISP ACCESSORY 4 ARM (KITS) IMPLANT
OCCLUDER COLPOPNEUMO (BALLOONS) ×2 IMPLANT
PACK LAVH (CUSTOM PROCEDURE TRAY) ×2 IMPLANT
PAD PREP 24X48 CUFFED NSTRL (MISCELLANEOUS) ×4 IMPLANT
PROTECTOR NERVE ULNAR (MISCELLANEOUS) ×4 IMPLANT
SET CYSTO W/LG BORE CLAMP LF (SET/KITS/TRAYS/PACK) IMPLANT
SET IRRIG TUBING LAPAROSCOPIC (IRRIGATION / IRRIGATOR) ×2 IMPLANT
SOLUTION ELECTROLUBE (MISCELLANEOUS) ×2 IMPLANT
SPONGE LAP 18X18 X RAY DECT (DISPOSABLE) IMPLANT
STRIP CLOSURE SKIN 1/2X4 (GAUZE/BANDAGES/DRESSINGS) ×2 IMPLANT
SUT MNCRL AB 3-0 PS2 27 (SUTURE) ×4 IMPLANT
SUT VIC AB 0 CT1 27 (SUTURE) ×2
SUT VIC AB 0 CT1 27XBRD ANBCTR (SUTURE) ×2 IMPLANT
SUT VIC AB 0 CT1 27XBRD ANTBC (SUTURE) IMPLANT
SUT VIC AB 0 CT2 27 (SUTURE) ×2 IMPLANT
SUT VIC AB 2-0 CT2 27 (SUTURE) ×2 IMPLANT
SUT VICRYL 0 27 CT2 27 ABS (SUTURE) ×8 IMPLANT
SUT VICRYL 0 UR6 27IN ABS (SUTURE) ×4 IMPLANT
SYR 50ML LL SCALE MARK (SYRINGE) ×2 IMPLANT
SYSTEM CONVERTIBLE TROCAR (TROCAR) ×2 IMPLANT
TIP UTERINE 5.1X6CM LAV DISP (MISCELLANEOUS) ×2 IMPLANT
TIP UTERINE 6.7X10CM GRN DISP (MISCELLANEOUS) IMPLANT
TIP UTERINE 6.7X6CM WHT DISP (MISCELLANEOUS) IMPLANT
TIP UTERINE 6.7X8CM BLUE DISP (MISCELLANEOUS) IMPLANT
TOWEL OR 17X24 6PK STRL BLUE (TOWEL DISPOSABLE) ×6 IMPLANT
TROCAR 12M 150ML BLUNT (TROCAR) ×2 IMPLANT
TROCAR DISP BLADELESS 8 DVNC (TROCAR) ×1 IMPLANT
TROCAR DISP BLADELESS 8MM (TROCAR) ×1
TROCAR XCEL 12X100 BLDLESS (ENDOMECHANICALS) IMPLANT
TROCAR XCEL NON BLADE 8MM B8LT (ENDOMECHANICALS) ×2 IMPLANT
TROCAR XCEL NON-BLD 5MMX100MML (ENDOMECHANICALS) ×2 IMPLANT
TROCAR Z-THREAD 12X150 (TROCAR) ×2 IMPLANT
TUBING FILTER THERMOFLATOR (ELECTROSURGICAL) ×2 IMPLANT
WARMER LAPAROSCOPE (MISCELLANEOUS) ×2 IMPLANT
WATER STERILE IRR 1000ML POUR (IV SOLUTION) ×12 IMPLANT

## 2011-12-08 NOTE — H&P (Signed)
H&P UPDATE History and Physical Interval Note:   12/08/2011   11:30 AM   [name]  has presented today for surgery, with the diagnosis of Endometriosis; Pelvic Pain  The various methods of treatment have been discussed with the patient and family. After consideration of risks, benefits and other options for treatment, the patient has consented to  Procedure(s): ROBOTIC ASSISTED TOTAL HYSTERECTOMY SALPINGO OOPHERECTOMY as a surgical intervention.  She understands there may possibly be a need for abdominal hysterectomy. I have examined the patient and  reviewed the patients' chart and labs.  Questions were answered to the patient's and husband's satisfaction.   BP 130/97  Pulse 71  Temp 98.2 F (36.8 C) (Oral)  Resp 18  Ht 5\' 4"  (1.626 m)  Wt 200 lb (90.719 kg)  BMI 34.33 kg/m2  SpO2 100% Results for orders placed during the hospital encounter of 12/08/11 (from the past 72 hour(s))  PREGNANCY, URINE     Status: Normal   Collection Time   12/08/11 10:10 AM      Component Value Range Comment   Preg Test, Ur NEGATIVE  NEGATIVE   hgb 13.3   HAYGOOD,VANESSA P  MD

## 2011-12-08 NOTE — Anesthesia Preprocedure Evaluation (Signed)
Anesthesia Evaluation  Patient identified by MRN, date of birth, ID band Patient awake    Reviewed: Allergy & Precautions, H&P , Patient's Chart, lab work & pertinent test results, reviewed documented beta blocker date and time   History of Anesthesia Complications Negative for: history of anesthetic complications  Airway Mallampati: II TM Distance: >3 FB Neck ROM: full    Dental No notable dental hx.    Pulmonary neg pulmonary ROS,  breath sounds clear to auscultation  Pulmonary exam normal       Cardiovascular Exercise Tolerance: Good negative cardio ROS  Rhythm:regular Rate:Normal     Neuro/Psych  Headaches,  Neuromuscular disease negative neurological ROS  negative psych ROS   GI/Hepatic negative GI ROS, Neg liver ROS,   Endo/Other  negative endocrine ROS  Renal/GU negative Renal ROS     Musculoskeletal  (+) Fibromyalgia -  Abdominal   Peds  Hematology negative hematology ROS (+)   Anesthesia Other Findings H/O varicella   As an infant. Chronic back pain   H/O    Fibroid   During first pregnancy GBS carrier        Rh negative status during pregnancy     Irregular uterine bleeding        Cancer 2002 Left leg Monilial vaginitis 07/27/04      First trimester bleeding 2008   Constipation in pregnancy 03/2006      AMA (advanced maternal age) multigravida 35+     H/O amenorrhea 10/2006      Acute mastitis of left breast 10/04/2007   Hypertension   history of HTN with pregnancy    Fibromyalgia                 Reproductive/Obstetrics negative OB ROS                           Anesthesia Physical Anesthesia Plan  ASA: II  Anesthesia Plan: General ETT   Post-op Pain Management:    Induction:   Airway Management Planned:   Additional Equipment:   Intra-op Plan:   Post-operative Plan:   Informed Consent: I have reviewed the patients History and Physical, chart, labs and discussed the  procedure including the risks, benefits and alternatives for the proposed anesthesia with the patient or authorized representative who has indicated his/her understanding and acceptance.   Dental Advisory Given  Plan Discussed with: CRNA and Surgeon  Anesthesia Plan Comments:         Anesthesia Quick Evaluation

## 2011-12-08 NOTE — Op Note (Signed)
Robotic Hysterectomy Procedure Note  Indications: Endometriosis, fibroids, pelvic pain, abnormal uterine bleeding s/p endometrial ablation  Pre-operative Diagnosis: fibroids, endometriosis  Post-operative Diagnosis: fibroids, endometriosis. Operation: robotically-assisted total hysterectomy.  Bilateral salpingo-oophorectomy Surgeon: Hal Morales  Assistants:Elmira Lowell Guitar certified physician Asst.  Anesthesia: General endotracheal anesthesia  ASA Class: 1  Procedure Details  The patient was seen in the Holding Room. The risks, benefits, complications, treatment options, and expected outcomes were discussed with the patient. The patient concurred with the proposed plan, giving informed consent. The site of surgery properly noted/marked. The patient confirmed that she had completed her prescribed bowel prep. The patient was taken to Operating Room #7, identified as [name] and the procedure verified as robotically assisted total hysterectomy with bilateral salpingo-oophorectomy; possible total abdominal hysterectomy. A Time Out was held and the above information confirmed.  After induction of anesthesia, the patient was placed in the modified lithotomy position with appropriate attention to padding of shoulders and all extremities as well as appropriate eye and face preparation for Trendelenburg position required for robotic surgery.  The abdomen was prepped with chlor prep and the vagina and perineum were prepped with multiple layers of chlorhexidine. A three-way Foley catheter was placed in the bladder under sterile conditions and connected to straight drainage. The abdomen and perineum were draped as a sterile field. A weighted speculum was placed in the posterior vagina and a Deaver retractor used to elevate the bladder. The cervix was identified,  and the cervix marked at the 10:00 position with a suture of 0 Vicryl. The uterus sounded to 8cm. The cervix was dilated to accomodate the Rumi.  The Rumi uterine manipulator and Koh ring were then placed into the endometrial cavity and vaginal fornix respectively, then the Koh ring tied down with the suture that had been placed.. Once this apparatus was in place the endometrial balloon was insufflated and the weighted speculum and retractor removed from the vagina. The surgeon changed gloves and advanced to the abdomen as the site of operation.  Evaluation of the upper limit of the uterus with elevation of the Rumi uterine manipulator allowed for placement of the port for the endoscopic camera. This was marked at a level approximately 12 cm above the top of the uterus and was placed just below the umbilicus. A midline incision to accommodate a 12 mm port was made and carried down to the fascia. The fascia was incised and with the assistance of blunt and sharp dissection the peritoneum was elevated and incised. A suture marking the peritoneum to the fascia was placed on either side and the Hassan cannula placed into the peritoneal cavity under direct visualization. The holding sutures were then firmly attached to the [facility] cannula and the camera placed through the [facility] cannula port. The pelvic viscera were visualized and planning for the remainder of the laparoscopic ports was made. 2 ports were made to the left of midline, the first approximately 3 cm above the anterior superior iliac spine and caudad to the camera port. This would house the third robotic arm. The next port was placed approximately half way between the camera port and the third robotic arm port but slightly cephalad to the camera port. This would house the second robotic arm. To the right of midline and approximately 5cm above the anterior superior iliac spine, a port  for the first robotic arm was placed.  The port for the assistant was to be an 8 mm trocar and was placed approximately 8 cm lateral to  and approximately 2 cm cephalad to the camera port. Once this planning had taken place  each of the sites was infiltrated with quarter percent Marcaine, incisions were made and the 8 mm trochars placed in the port for the first, second, and third robotic arms in succession. An incision was then made to accommodate a 8 mm trocar at the site of the assistant port. All trocars were placed under direct visualization and were centered appropriately. The robot was then docked. The PK bipolar forceps were placed through the second robotic arm and the monopolar scissors placed through the first robotic arm. The third robotic arm housed a long forcep which was kept Germaine [place] the left pelvic sidewall for subsequent use as needed.  The surgeon then went to the console to take control of the instruments. The right round ligament were successively cauterized with bipolar cautery, then cold cut.  The peritoneum was then incised toward the infundibulopelvic in a vascular free space to isolate the infundibulopelvic ligament on that side. It was then grasped with PK and cauterized in several sections then cold cut with hemostasis noted to be adequate. The anterior leaf of the broad ligament was incised with monopolar cutting down to the level of the cervix. Skeletonization of the uterine vasculature was achieved with a combination of blunt dissection and monopolar cautery. A similar procedure was carried out with the  the round ligament and infundibulopelvic ligament on the left side. The left anterior lead of the broad ligament was then incised and carried down to meet the incision from the other side. The bladder was dissected off the anterior cervix and upper vagina, allowing lateral movement of the ureters bilaterally. The uterine arteries on the right then the left side were skeletonized, and successively cauterized with bipolar cautery. This included a visible vaginal branch of the uterine artery on both sides. Throughout this portion of the dissection and cautery the upper limits of the Koh ring could be identified  visually. Once the uterine vasculature on each side was adequately cauterized the vessels on the left side were cut with the monopolar scissors with adequate hemostasis noted. The vessels on the opposite side were then cut with hemostasis noted and the vagina entered with the monopolar cutting function of the scissors using the upper edge of the [facility] ring as a guide. This incision was taken circumferentially around the vaginal tube until the uterus and cervix were completely detached from the upper vagina. The uterus, tubes and ovaries were then removed through the vagina.  Hemostasis in the vaginal cuff was achieved with bipolar cautery. The vaginal cuff was then closed with figure of 8 sutures of 0 Vicryl. Hemostasis was noted to be adequate. The pelvis was irrigated with warm saline and hemostasis noted to be maintained even after allowing some of the pneumoperitoneum to escape.  The surgeon then washed hands and returned to the patient side for undocking of the robot. The trocars were then removed in succession under direct visualization. The laparoscope was removed and the [facility] cannula removed last. The sutures that had been used to hold the [place] in place were then used to close the fascia of the camera port incision in a figure of 8 fashion with the 0 Vicryl that was in place.  The skin incisions on all 5 ports were closed with subcuticular sutures of 3-0 Monocryl and covered with Dermabond.  Instrument, sponge, and needle counts were correct prior to abdominal closure and at the conclusion of the case. The patient  was then awakened from general anesthesia and taken to the recovery room in satisfactory condition having tolerated the procedure well and with a Foley catheter remaining in place.She was treated with IV Toradol 30 mg prior to awakening.  Findings:  The uterus was  approximately 8weeks' size with a 3 cm fibroid at the mid fundus that was subserosal in location .  The tubes bilaterally were  status post tubal interruption for sterilization. The ovaries appeared normal bilaterally. There were puckered adhesions on the mesosalpinx on the left and the posterior peritoneum near the uterosacral ligament on the left. Estimated Blood Loss: less than 100 mL  Drains: none  Total IV Fluids: 2000 cc Specimens: Uterus and cervix , bilateral tubes and ovaries Implants: na  Complications: None; patient tolerated the procedure well.  Disposition: PACU - hemodynamically stable.  Condition: stable  Attending Attestation: I performed the procedure.

## 2011-12-08 NOTE — Anesthesia Postprocedure Evaluation (Signed)
Anesthesia Post Note  Patient: [name]  Procedure(s) Performed: Procedure(s) (LRB): ROBOTIC ASSISTED TOTAL HYSTERECTOMY (Bilateral) SALPINGO OOPHERECTOMY (Bilateral)  Anesthesia type: General  Patient location: PACU  Post pain: Pain level controlled  Post assessment: Post-op Vital signs reviewed  Last Vitals:  Filed Vitals:   12/08/11 1700  BP: 118/67  Pulse: 76  Temp:   Resp: 16    Post vital signs: Reviewed  Level of consciousness: sedated  Complications: No apparent anesthesia complicationsfj

## 2011-12-08 NOTE — Transfer of Care (Signed)
Immediate Anesthesia Transfer of Care Note  Patient: [name]  Procedure(s) Performed: Procedure(s) (LRB) with comments: ROBOTIC ASSISTED TOTAL HYSTERECTOMY (Bilateral) SALPINGO OOPHERECTOMY (Bilateral)  Patient Location: PACU  Anesthesia Type: General  Level of Consciousness: awake, alert  and oriented  Airway & Oxygen Therapy: Patient Spontanous Breathing and Patient connected to nasal cannula oxygen  Post-op Assessment: Report given to PACU RN and Post -op Vital signs reviewed and stable  Post vital signs: Reviewed and stable  Complications: No apparent anesthesia complications

## 2011-12-09 ENCOUNTER — Encounter (HOSPITAL_COMMUNITY): Payer: Self-pay | Admitting: Obstetrics and Gynecology

## 2011-12-09 LAB — CBC
Hemoglobin: 12.4 g/dL (ref 12.0–15.0)
MCH: 28.8 pg (ref 26.0–34.0)
MCHC: 33.6 g/dL (ref 30.0–36.0)
MCV: 85.6 fL (ref 78.0–100.0)
Platelets: 305 10*3/uL (ref 150–400)
RBC: 4.31 MIL/uL (ref 3.87–5.11)

## 2011-12-09 MED ORDER — ESTRADIOL 0.05 MG/24HR TD PTTW: 1.0000 | MEDICATED_PATCH | TRANSDERMAL | Status: DC

## 2011-12-09 NOTE — H&P (Signed)
[name] is a 41 y.o. female [phone] who presents for hysterectomy with bilateral salpingo-oophorectomy because of dysfunctional uterine bleeding. For over 4 years patient has had almost daily bleeding, ranging from spotting to the saturation of a pad resulting in her clothes being soiled. The patient recalls bleeding continuously for 8 weeks, but most of the time she would have bleeding off and on every few days. She was given Aygestin initially for management, that she never took, and in 2010 she underwent an endometrial ablation. This procedure eliminated the heavy bleeding she was experiencing but not the persistent irregular pattern. At the time of her ablation, laparoscopic tubal sterilization was performed and it was discovered that patient had endometriosis. She admits to cramps, rated at 3/10 on a 10 point pain scale but denies urinary tract or vaginitis symptoms, dyspareunia or change in bowel movements. A pelvic ultrasound September 2013 showed: uterus-7.70 x 5.48 x 5.46 cm and ovaries: Left 2.99 x 1.65 x 1.39 cm, Right 4.16 x 3.03 x 2.82 cm with a simple right ovarian cyst 2.7 x 2.4 x 2.7 cm. Patient's endometrium measured 5.46 mm. After reviewing both medical and surgical management options, to include observation, patient has decided to proceed with definitive treatment in the form of hysterectomy. Patient also requests that her ovaries be removed.  Past Medical History   OB History: [id number] Spontaneous vaginal births, 2002, 2004 and 2009.   GYN History: menarche: 41 YO LMP: see HPI, Contracepton: Tubal Sterilzation, The patient denies history of sexually transmitted disease. Denies history of abnormal PAP smear Last PAP smear September 2012   Medical History: endometriosis, hemorrhoids, hypertension, left leg melanoma, left wrist tendinitis, herniated lumbar disc, cervical polyp, left knee medial meniscus damage, right shoulder joint pain, seasonal allergies, episodic achiness (being  worked up for fibromyalgia) and right hand joint pain.   Surgical History: 2002 Appendectomy; 2008 D & C, 2010 Thermachoice Endometrial Ablation/Tubal Sterilization (diagnosed with endometriosis),  Denies problems with anesthesia though difficulty to awaken. 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  Beclomethasone Dipropionate (QNASL) 80 MCG/ACT AERS  Place 2 Squirts into the nose daily. Each nostril     .  Fexofenadine HCl (ALLEGRA PO)  Take 1 tablet by mouth daily. otc     .  ibuprofen (ADVIL,MOTRIN) 200 MG tablet  Take 400-800 mg by mouth 2 (two) times daily as needed. headache     .  montelukast (SINGULAIR) 10 MG tablet  Take 10 mg by mouth at bedtime.     .  multivitamin (THERAGRAN) per tablet  Take 1 tablet by mouth daily.      Allergies   Allergen  Reactions   .  Betadine (Povidone Iodine)      Dx with allergy testing   .  Cymbalta (Duloxetine Hcl)      Makes her feel crazy, out of body   .  Shellfish Allergy      Dx with allergy testing    ROS: Admits to headaches caused by allergies seasonal allergies, knee, hand and shoulder arthralgias (being evaluated for fibromyalgia) but denies vision changes, nasal congestion, dysphagia, tinnitus, dizziness, hoarseness, cough, chest pain, shortness of  breath, nausea, vomiting, diarrhea,constipation, urinary frequency, urgency dysuria, hematuria, vaginitis symptoms, pelvic pain, swelling of joints,easy bruising, myalgias, skin rashes, unexplained weight loss and except as is mentioned in the history of present illness, patient's review of systems is otherwise negative.   \  Physical Exam   BP 122/80  Pulse 80  Temp 98.4 F (36.9 C) (Oral)  Resp 16  Ht 5\' 4"  (1.626 m)  Wt 204 lb (92.534 kg)  BMI 35.02 kg/m2  Neck: supple without masses or thyromegaly  Lungs: clear to auscultation  Heart: regular rate and rhythm  Abdomen: soft, non-tender and no organomegaly  Pelvic:EGBUS- wnl; vagina-normal rugae; uterus-normal size, cervix without lesions or motion tenderness; adnexae-no tenderness or masses  Extremities: no clubbing, cyanosis or edema    Endometrial biopsy was attempted at 11/16/11 visit but endometrial sampling was not possible, most likely due to prior endometrial ablation. Given that a subsequent  ultrasound showed an endometrium measuring 5. 46 mm it was deemed unlikely that an endometrial malignancy is present.     Assesment: Dysfunctional Uterine Bleeding  Endometriosis  S/P Thermachoice Endometrial Ablation  Family History of Ovarian Cancer     Disposition: The patient was given the indication for her procedure(s) along with the risks, which include but are not limited to, reaction to anesthesia, damage to adjacent organs, Infection, excessive bleeding, formation of scar tissue, pelvic prolapse and the possible need for an open abdominal incision. She was further advised that she will experience transient post operative facial edema, that her hospital stay is expected to be 0-2 days, she should be able to return to her usual activities within 2-3 weeks (except intercourse, to be delayed until 8 weeks) and that the robotic approach to her surgery requires more time to perform than an open abdominal approach. Patient was given the Miralax bowel prep to be completed 24 hours prior to procedure. She verbalized understanding of these risks and pre-operative instructions and has consented to proceed with a Robot Assisted Total Laparoscopic Hysterectomy with Bilateral Salpingo-oophorectomy with a possible Laparoscopically Assisted Vaginal  Hysterectomy or Open Abdominal Hysterectomy at [facility] [place], December 08, 2011 at 12 noon.      CSN# [id number]  [place] J. Lowell Guitar, PA-C for Dr. Maris Berger. Messiyah Waterson

## 2011-12-09 NOTE — Addendum Note (Signed)
Addendum  created 12/09/11 0816 by Shanon Payor, CRNA   Modules edited:Notes Section

## 2011-12-09 NOTE — Progress Notes (Signed)
Patient ID: [name], female   DOB: [birth year], 41 y.o.   MRN: [id number] 1 Day Post-Op Procedure(s) (LRB): ROBOTIC ASSISTED TOTAL HYSTERECTOMY (Bilateral) SALPINGO OOPHERECTOMY (Bilateral)  Subjective: Patient reports tolerating PO and no problems voiding.  She has ambulated well without dizziness.  She has taken only NSAIDS since surgery.  She complains of "feeling tense" which she thinks may be her pain equivalent.  Objective: I have reviewed patient's vital signs, intake and output, medications and labs. BP 108/71  Pulse 106  Temp 98.7 F (37.1 C) (Oral)  Resp 19  Ht 5\' 4"  (1.626 m)  Wt 200 lb (90.719 kg)  BMI 34.33 kg/m2  SpO2 97%   Vaginal Bleeding: the pt passed a 2cm clot into the toilet with voiding, but had no bleeding into the toilet thereafter. Her pad which has been on since last night has a thin line of dark red and a thin line of brighter red bleeding on it Results for orders placed during the hospital encounter of 12/08/11 (from the past 48 hour(s))  PREGNANCY, URINE     Status: Normal   Collection Time   12/08/11 10:10 AM      Component Value Range Comment   Preg Test, Ur NEGATIVE  NEGATIVE   CBC     Status: Normal   Collection Time   12/09/11  8:02 AM      Component Value Range Comment   WBC 10.5  4.0 - 10.5 K/uL    RBC 4.31  3.87 - 5.11 MIL/uL    Hemoglobin 12.4  12.0 - 15.0 g/dL    HCT 14.7  82.9 - 56.2 %    MCV 85.6  78.0 - 100.0 fL    MCH 28.8  26.0 - 34.0 pg    MCHC 33.6  30.0 - 36.0 g/dL    RDW 13.0  86.5 - 78.4 %    Platelets 305  150 - 400 K/uL     Assessment: s/p Procedure(s): ROBOTIC ASSISTED TOTAL HYSTERECTOMY SALPINGO OOPHERECTOMY: stable, tolerating diet and mild asymptomatic tachycardia  Plan: Encourage ambulation Discharge home Dr. Normand Sloop will assess pt after her shower to assess for any concerns  LOS: 1 day    Kace Hartje P 12/09/2011, 9:46 AM

## 2011-12-09 NOTE — Progress Notes (Signed)
[name] is a50 y.o.  [id number]  Post Op Date # 1  Robot Assisted Total Laparoscopic Hysterectomy with Bilateral Salpingo-oophorectomy  Subjective: Patient is Doing well postoperatively. Patient has Pain is controlled with current analgesics. Medications being used: ibuprofen (OTC)., Denies any nausea, lightheadedness or other complaints.  Had a regular meal last night, has voided without difficulty and is ambulating without difficulty. Patient would like to begin estradiol patch.  Reviewed use, dose along with risks to include VTE events.  Patient has a history of superficial thrombophlebitis but no DVT.  Objective: Vital signs in last 24 hours: Temp:  [98.2 F (36.8 C)-98.8 F (37.1 C)] 98.7 F (37.1 C) (10/11 0525) Pulse Rate:  [71-111] 111  (10/11 0525) Resp:  [16-20] 19  (10/11 0525) BP: (118-146)/(62-97) 129/82 mmHg (10/11 0525) SpO2:  [94 %-100 %] 97 % (10/11 0525) Weight:  [90.719 kg (200 lb)] 90.719 kg (200 lb) (10/10 1745)  Intake/Output from previous day: 10/10 0701 - 10/11 0700 In: 4255 [P.O.:480; I.V.:3775] Out: 1075 [Urine:1025] Intake/Output this shift:    Lab 12/05/11 1348  WBC 6.8  HGB 13.3  HCT 38.9  PLT 304    No results found for this basename: NA:3,K:3,CL:3,CO2:3,BUN:3,CREATININE:3,CALCIUM:3,LABALBU:3,PROT:3,BILITOT:3,ALKPHOS:3,ALT:3,AST:3,GLUCOSE:3 in the last 168 hours  EXAM: General: alert, cooperative and no distress Resp: clear to auscultation bilaterally Cardio: Tachycardic, regular rhythm without murmur, rub or gallop GI: soft, appropriately tender, incisions intact without evidence of infection;  perineal pad is clean. Extremities: no calf tenderness and negative Homan's sign Vaginal Bleeding: none    Assessment: s/p Procedure(s): ROBOTIC ASSISTED TOTAL HYSTERECTOMY SALPINGO OOPHERECTOMY: stable, progressing well and Tachycardic  Plan: CBC and orthostatic Bp & pulses Discharge home today [place] Patch 0.05 mg  # 8  apply patch  to lower abdomen twice weekly 12 refills  LOS: 1 day    Zeppelin Beckstrand, PA-C 12/09/2011 7:52 AM

## 2011-12-09 NOTE — Discharge Summary (Signed)
  Physician Discharge Summary  Patient ID: [name] MRN: [id number] DOB/AGE: 1970-05-21 41 y.o.  Admit date: 12/08/2011 Discharge date: 12/09/2011   Discharge Diagnoses:  Symptomatic Uterine Fibroids, Abnormal Uterine Bleeding and Endometriosis Active Problems:  * No active hospital problems. *    Operation:  Robot Assisted Total Laparoscopic Hysterectomy with Bilateral Salpingo-Oophorectomy.   Discharged Condition: Stable and progressing well  Hospital Course: On the date of admission, the patient underwent aforementioned procedure, tolerating it well.  Post operative course was unremarkable with patient resuming normal bowel and bladder function by post operative day #1 and therefore deemed ready for discharge home.  Disposition: Home to self care  Discharge Medications:   [name], [name]  Home Medication Instructions BMW:[id number]   Printed on:12/09/11 0808  Medication Information                    multivitamin [facility]) per tablet Take 1 tablet by mouth daily.             Fexofenadine HCl (ALLEGRA PO) Take 1 tablet by mouth daily. otc           Beclomethasone Dipropionate (QNASL) 80 MCG/ACT AERS Place 2 Squirts into the nose daily. Each nostril           montelukast (SINGULAIR) 10 MG tablet Take 10 mg by mouth at bedtime.           oxyCODONE-acetaminophen (ROXICET) 5-325 MG per tablet Take 1 tablet by mouth every 4 (four) hours as needed for pain.           ibuprofen (ADVIL,MOTRIN) 600 MG tablet Take 1 tablet (600 mg total) by mouth every 6 (six) hours as needed for pain.           estradiol (MINIVELLE) 0.05 MG/24HR Place 1 patch (0.05 mg total) onto the skin 2 (two) times a week.              Follow-up:  Dr. Pennie Rushing,  December 26, 2011 at 8:30 a.m.   Signed: Roderic Lammert 12/09/2011, 8:08 AM

## 2011-12-09 NOTE — Anesthesia Postprocedure Evaluation (Signed)
  Anesthesia Post-op Note  Patient: [name]  Procedure(s) Performed: Procedure(s) (LRB) with comments: ROBOTIC ASSISTED TOTAL HYSTERECTOMY (Bilateral) SALPINGO OOPHERECTOMY (Bilateral)  Patient Location: [facility]  Anesthesia Type: General  Level of Consciousness: awake, alert  and oriented  Airway and Oxygen Therapy: Patient Spontanous Breathing  Post-op Pain: none  Post-op Assessment: Post-op Vital signs reviewed and Patient's Cardiovascular Status Stable  Post-op Vital Signs: Reviewed and stable  Complications: No apparent anesthesia complications

## 2011-12-09 NOTE — Progress Notes (Signed)
Pt is discharged in the care of husband Downstairs per ambulatory. Spirits were good..Comphrended all instructions well. Questions were asked and answered.Abdominal incision were  Clean AND DRY.Stable

## 2011-12-09 NOTE — Progress Notes (Signed)
Patient ID: [name], female   DOB: December 20, 1970, 41 y.o.   MRN: [id number] 1 Day Post-Op Procedure(s): ROBOTIC ASSISTED TOTAL HYSTERECTOMY SALPINGO OOPHERECTOMY  Subjective: Patient reports tolerating PO and no problems voiding.    Objective: BP 122/81  Pulse 93  Temp 98.6 F (37 C) (Oral)  Resp 18  Ht 5\' 4"  (1.626 m)  Wt 90.719 kg (200 lb)  BMI 34.33 kg/m2  SpO2 100%  General: alert and cooperative GI: soft, non-tender; bowel sounds normal; no masses,  no organomegaly Vaginal Bleeding: minimal  Assessment: s/p Procedure(s): ROBOTIC ASSISTED TOTAL HYSTERECTOMY SALPINGO OOPHERECTOMY: stable  Plan: Discharge home Pts bleeding is normal. Precautions given  LOS: 1 day    Dierra Riesgo A 12/09/2011, 1:19 PM

## 2011-12-09 NOTE — Progress Notes (Signed)
While pt was in shower she passed a 2 cm clot. After shower pt walked in  [place]. Tolerating task well. After walk pt was only experiencing small amt of bleeding on V-pad.

## 2011-12-12 ENCOUNTER — Telehealth: Payer: Self-pay | Admitting: Obstetrics and Gynecology

## 2011-12-12 NOTE — Telephone Encounter (Signed)
Telephone call to patient in followup of her telephone call on yesterday couldn't complaining of a rash over the abdomen extending down toward her knees that was bilateral. The rash has not expanded and  has not caused significant difficulty with pruritus. The patient continues her Singulair and Allegra as prescribed. She has used hydrocortisone cream on the rash with some relief.  She is advised to continue with the hydrocortisone cream and to add a Benadryl lotion as needed for comfort. She plans to start her transdermal estrogen patch today since her hot flashes have become significant.  She is asked to make certain that the application of the patch is outside the area of the rash so that we are able to determine if there is any skin reaction to the adhesive from patch. She states that she feels otherwise well and is taking a single tablet of Percocet in the morning and at night but no pain medication otherwise.  She has no other complaints but is encouraged to call if she has any other concerns.

## 2011-12-21 ENCOUNTER — Ambulatory Visit (INDEPENDENT_AMBULATORY_CARE_PROVIDER_SITE_OTHER): Payer: BC Managed Care – PPO | Admitting: Obstetrics and Gynecology

## 2011-12-21 ENCOUNTER — Encounter: Payer: Self-pay | Admitting: Obstetrics and Gynecology

## 2011-12-21 ENCOUNTER — Telehealth: Payer: Self-pay | Admitting: Obstetrics and Gynecology

## 2011-12-21 VITALS — BP 122/72 | Temp 98.8°F | Ht 65.0 in | Wt 201.0 lb

## 2011-12-21 DIAGNOSIS — R3915 Urgency of urination: Secondary | ICD-10-CM

## 2011-12-21 DIAGNOSIS — N390 Urinary tract infection, site not specified: Secondary | ICD-10-CM

## 2011-12-21 DIAGNOSIS — N809 Endometriosis, unspecified: Secondary | ICD-10-CM

## 2011-12-21 DIAGNOSIS — H6690 Otitis media, unspecified, unspecified ear: Secondary | ICD-10-CM

## 2011-12-21 DIAGNOSIS — N949 Unspecified condition associated with female genital organs and menstrual cycle: Secondary | ICD-10-CM

## 2011-12-21 DIAGNOSIS — N926 Irregular menstruation, unspecified: Secondary | ICD-10-CM

## 2011-12-21 DIAGNOSIS — N939 Abnormal uterine and vaginal bleeding, unspecified: Secondary | ICD-10-CM

## 2011-12-21 DIAGNOSIS — R102 Pelvic and perineal pain: Secondary | ICD-10-CM

## 2011-12-21 LAB — POCT URINALYSIS DIPSTICK
Glucose, UA: NEGATIVE
Nitrite, UA: NEGATIVE
Protein, UA: NEGATIVE
Urobilinogen, UA: NEGATIVE

## 2011-12-21 MED ORDER — SULFAMETHOXAZOLE-TRIMETHOPRIM 800-160 MG PO TABS: 1.0000 | ORAL_TABLET | Freq: Two times a day (BID) | ORAL | Status: AC

## 2011-12-21 NOTE — Progress Notes (Signed)
GYN PROBLEM VISIT  [name] is a 41 y.o. year old female,G5P3023, who presents for a problem visit.   Subjective:  Pt had Robotic Hysterectomy AND OOPHORECTOMY on 12/08/2011. Pt c/o having bladder fullness x 2 days. Also feels like she is having pelvic tension and vaginal irritation. She is taking only ibuprofen for pain and that infrequently.  Her  last dose of narcotic pain medication was on her fifth postoperative day. No nausea, vomiting constipation or diarrhea.  She had had urinary frequency but no actual pain with urination.  He does feel as if she is incompletely.  She denies vaginal bleeding. Also c/o right ear pain since last night.No fever or chills.  No drainage   Objective:  BP 122/72  Temp 98.8 F (37.1 C) (Oral)  Ht 5\' 5"  (1.651 m)  Wt 201 lb (91.173 kg)  BMI 33.45 kg/m2  LMP 11/19/2010  Ears:  Right with anterior canal erythema GI: soft, non-tender; bowel sounds normal; no masses,  no organomegaly and no CVAT  External genitalia: normal general appearance Bimanual limited to bladder which reveals no fullness or tenderness Exam limited by post op state Urine dipstick shows positive for RBC's and positive for leukocytes.     Assessment:  right otitis interna possible UTI   Plan: Urine C&S Septra D/S FOR 7 DAYS Cranberry juice Return to office in 5 day(s) FOR POST OP AND F/U   Dierdre Forth, MD  12/21/2011 2:57 PM

## 2011-12-22 ENCOUNTER — Encounter: Payer: BC Managed Care – PPO | Admitting: Obstetrics and Gynecology

## 2011-12-23 LAB — URINE CULTURE

## 2011-12-26 ENCOUNTER — Encounter: Payer: Self-pay | Admitting: Obstetrics and Gynecology

## 2011-12-26 ENCOUNTER — Ambulatory Visit (INDEPENDENT_AMBULATORY_CARE_PROVIDER_SITE_OTHER): Payer: BC Managed Care – PPO | Admitting: Obstetrics and Gynecology

## 2011-12-26 VITALS — BP 132/68 | HR 64 | Temp 98.5°F | Resp 16 | Ht 65.0 in | Wt 202.0 lb

## 2011-12-26 DIAGNOSIS — N951 Menopausal and female climacteric states: Secondary | ICD-10-CM

## 2011-12-26 DIAGNOSIS — N809 Endometriosis, unspecified: Secondary | ICD-10-CM

## 2011-12-26 DIAGNOSIS — R3915 Urgency of urination: Secondary | ICD-10-CM

## 2011-12-26 LAB — POCT URINALYSIS DIPSTICK
Bilirubin, UA: NEGATIVE
Ketones, UA: NEGATIVE
Nitrite, UA: NEGATIVE
Spec Grav, UA: 1.02
pH, UA: 5

## 2011-12-26 NOTE — Progress Notes (Signed)
POST-OP:   Subjective:     [name] is a 41 y.o. female who presents for post-op visit.  DATE OF SURGERY: 12/08/2011 TYPE OF SURGERY: Robotic Hysterectomy and Oophorectomy PAIN:Yes c/o lower abdominal pain/cramp ; occasional. All symptoms from last vist have resolved VAG BLEEDING: no VAG DISCHARGE: no NORMAL GI FUNCTN: yes NORMAL GU FUNCTN: yes  Pathology report:  was reviewed with patient. Diagnosis Uterus, ovaries and fallopian tubes - CERVIX: ESSENTIALLY UNREMARKABLE. - ENDOMETRIUM: FEATURES SUGGESTIVE OF PRIOR PROCEDURE. - MYOMETRIUM: LEIOMYOMA. - SEROSA: UNREMARKABLE. - BILATERAL ADNEXA: ENDOMETRIOSIS, FOCAL. SEROSAL ADHESIONS.  Urine C&S from last visit NEG U/A today:  Neg  The following portions of the patient's history were reviewed and updated as appropriate: allergies, current medications, past family history, past medical history, past social history, past surgical history and problem list.  Review of Systems Pertinent items are noted in HPI.   Objective:    Ht 5\' 5"  (1.651 m)  LMP 11/19/2010 Weight:  Wt Readings from Last 1 Encounters:  12/21/11 201 lb (91.173 kg)    BMI: There is no weight on file to calculate BMI.  General Appearance: Alert, appropriate appearance for age. No acute distress Lungs: clear to auscultation bilaterally Back: No CVA tenderness Cardiovascular: Regular rate and rhythm. S1, S2, no murmur Gastrointestinal: Soft, non-tender, no masses or organomegaly Incision/s: healing well Pelvic Exam: deferred  Assessment:    Doing well postoperatively. Operative findings again reviewed.   Plan:  Continue vivelle patch Gradually increase activity No vaginal entry F/u 6 wks   Dierdre Forth MD 10/28/20138:43 AM

## 2012-01-03 ENCOUNTER — Encounter: Payer: BC Managed Care – PPO | Admitting: Obstetrics and Gynecology

## 2012-02-06 ENCOUNTER — Other Ambulatory Visit: Payer: Self-pay | Admitting: Obstetrics and Gynecology

## 2012-02-06 ENCOUNTER — Other Ambulatory Visit: Payer: Self-pay | Admitting: Family Medicine

## 2012-02-06 DIAGNOSIS — Z1231 Encounter for screening mammogram for malignant neoplasm of breast: Secondary | ICD-10-CM

## 2012-02-07 ENCOUNTER — Ambulatory Visit (INDEPENDENT_AMBULATORY_CARE_PROVIDER_SITE_OTHER): Payer: BC Managed Care – PPO | Admitting: Obstetrics and Gynecology

## 2012-02-07 ENCOUNTER — Encounter: Payer: Self-pay | Admitting: Obstetrics and Gynecology

## 2012-02-07 VITALS — BP 112/64 | Ht 65.0 in | Wt 209.0 lb

## 2012-02-07 DIAGNOSIS — Z87898 Personal history of other specified conditions: Secondary | ICD-10-CM

## 2012-02-07 DIAGNOSIS — Z789 Other specified health status: Secondary | ICD-10-CM

## 2012-02-07 NOTE — Progress Notes (Signed)
GYN PROBLEM VISIT  [name] is a 41 y.o. year old female,G5P3023, who presents for a post op follow up.   Subjective:  DATE OF SURGERY: 12/08/2011 TYPE OF SURGERY: Robotic Hysterectomy and Oophorectomy PAIN:Yes pt c/o having occasional "crampy" feeling in pelvic area. VAG BLEEDING: no VAG DISCHARGE: no  More vaginal dryness NORMAL GI FUNCTN: yes NORMAL GU FUNCTN: yes   Objective:  BP 112/64  Ht 5\' 5"  (1.651 m)  Wt 209 lb (94.802 kg)  BMI 34.78 kg/m2   GI: soft, non-tender; bowel sounds normal; no masses,  no organomegaly  Incisions well healed  External genitalia: normal general appearance Vaginal: normal rugae and vaginal vault, well healed and suspended Cervix: removed surgically Adnexa: removed surgically Uterus: removed surgically Rectovaginal:  No Masses  Assessment: Good post op recovery Good control of estrogen deficiency hot flashes Vaginal dryness  Plan: OTC lubricants for intercourse.  Call for Vagifem or Estradiol cream(pt choice) if dryness persisits Return to all preop activities Return to office in 10/2012 for aex or prn   Dierdre Forth, MD  02/07/2012 9:07 AM

## 2012-03-14 ENCOUNTER — Ambulatory Visit
Admission: RE | Admit: 2012-03-14 | Discharge: 2012-03-14 | Disposition: A | Discharge status: Home / Self Care | Payer: BC Managed Care – PPO | Source: Ambulatory Visit | Attending: Obstetrics and Gynecology | Admitting: Obstetrics and Gynecology

## 2012-03-14 DIAGNOSIS — Z1231 Encounter for screening mammogram for malignant neoplasm of breast: Secondary | ICD-10-CM

## 2012-03-15 ENCOUNTER — Ambulatory Visit: Payer: BC Managed Care – PPO | Admitting: Family Medicine

## 2012-03-15 ENCOUNTER — Encounter: Payer: Self-pay | Admitting: *Deleted

## 2012-03-15 ENCOUNTER — Encounter: Payer: Self-pay | Admitting: Family Medicine

## 2012-03-15 ENCOUNTER — Ambulatory Visit (INDEPENDENT_AMBULATORY_CARE_PROVIDER_SITE_OTHER): Payer: BC Managed Care – PPO | Admitting: Family Medicine

## 2012-03-15 VITALS — BP 120/90 | HR 77 | Temp 98.2°F | Ht 63.75 in | Wt 210.2 lb

## 2012-03-15 DIAGNOSIS — R51 Headache: Secondary | ICD-10-CM

## 2012-03-15 DIAGNOSIS — R1013 Epigastric pain: Secondary | ICD-10-CM

## 2012-03-15 LAB — CBC WITH DIFFERENTIAL/PLATELET
Basophils Absolute: 0 10*3/uL (ref 0.0–0.1)
Basophils Relative: 0.5 % (ref 0.0–3.0)
Eosinophils Absolute: 0.2 10*3/uL (ref 0.0–0.7)
HCT: 38.1 % (ref 36.0–46.0)
Hemoglobin: 12.9 g/dL (ref 12.0–15.0)
Lymphs Abs: 1.8 10*3/uL (ref 0.7–4.0)
MCHC: 33.9 g/dL (ref 30.0–36.0)
MCV: 86.4 fl (ref 78.0–100.0)
Neutro Abs: 3.4 10*3/uL (ref 1.4–7.7)
RDW: 13.3 % (ref 11.5–14.6)

## 2012-03-15 LAB — H. PYLORI ANTIBODY, IGG: H Pylori IgG: NEGATIVE

## 2012-03-15 LAB — HEPATIC FUNCTION PANEL
Bilirubin, Direct: 0 mg/dL (ref 0.0–0.3)
Total Bilirubin: 0.5 mg/dL (ref 0.3–1.2)
Total Protein: 6.9 g/dL (ref 6.0–8.3)

## 2012-03-15 LAB — BASIC METABOLIC PANEL
BUN: 12 mg/dL (ref 6–23)
Calcium: 9.1 mg/dL (ref 8.4–10.5)
Creatinine, Ser: 0.5 mg/dL (ref 0.4–1.2)

## 2012-03-15 MED ORDER — OMEPRAZOLE 20 MG PO CPDR: 20.0000 mg | DELAYED_RELEASE_CAPSULE | Freq: Every day | ORAL | Status: DC

## 2012-03-15 NOTE — Progress Notes (Signed)
  Subjective:    Patient ID: [name], female    DOB: 1970/06/28, 42 y.o.   MRN: [id number]  HPI abd pain- hysterectomy 12/08/11, had colon prep 2 days prior that caused 'horrible heart burn'.  Has recovered very well from surgery- 'it was amazing'.  Since surgery has been routinely taking tums w/ some relief.  Will have epigastric pain along w/ reflux into chest.  Will have a 'burning, gnawing' pain.  Doesn't appear to be related to food.  Had 2 episodes last week where abdominal pain caused her to double her over.  Pain improved w/ pepto and lying on R side.  Is also having 'gurgling' in LUQ.  Has been taking Carafate x2 days.  HA- started yesterday, took tylenol w/ some improvement.  Was avoiding ibuprofen due to #1.  Has had complete sinus w/u that was negative.  Pt has TMJ and was told to have this addressed but has not gone.  Wearing a night guard but having malocclusion.   Review of Systems For ROS see HPI     Objective:   Physical Exam  Vitals reviewed. Constitutional: She is oriented to person, place, and time. She appears well-developed and well-nourished. No distress.  HENT:  Head: Normocephalic and atraumatic.       TMs WNL bilaterally No TTP over sinuses  Eyes: Conjunctivae normal and EOM are normal. Pupils are equal, round, and reactive to light.  Neck: Normal range of motion. Neck supple.  Cardiovascular: Normal rate, regular rhythm and normal heart sounds.   Pulmonary/Chest: Effort normal and breath sounds normal. No respiratory distress. She has no wheezes. She has no rales.  Abdominal: Soft. Bowel sounds are normal. She exhibits distension (mild). She exhibits no mass. There is tenderness (mild epigastric TTP). There is no rebound and no guarding.  Lymphadenopathy:    She has no cervical adenopathy.  Neurological: She is alert and oriented to person, place, and time. No cranial nerve deficit. Coordination normal.  Skin: Skin is warm and dry.            Assessment & Plan:

## 2012-03-15 NOTE — Patient Instructions (Addendum)
We'll notify you of your lab results and make any changes if needed Start the Omeprazole daily for the increased acid Schedule an appt with a new dentist Call with any questions or concerns Hang in there!!!

## 2012-03-20 ENCOUNTER — Telehealth: Payer: Self-pay

## 2012-03-20 DIAGNOSIS — R1013 Epigastric pain: Secondary | ICD-10-CM

## 2012-03-20 NOTE — Telephone Encounter (Signed)
With continued pain will need GI referral.  If pain is still central above the belly button, can order abd [place] to assess pain

## 2012-03-20 NOTE — Telephone Encounter (Signed)
Please call and ask what her questions are

## 2012-03-20 NOTE — Telephone Encounter (Signed)
Spoke with patient, patient states she was told to call if she continued to have pain. Patient states she is still in pain, on a scale of 1-10 (pain is a 1-2, while sitting). Patient states when standing pain is about a 7. Patient is following instruction given at OV. Patient aware Dr.Tabori is out of the office this afternoon, patient would like her recommendations and prefers a call the earlier part of tomorrow.   Message printed and placed on Ledge for Dr.Tabori in the am   Advised patient ER if symptoms persist or progress, patient verbalized understanding

## 2012-03-20 NOTE — Telephone Encounter (Signed)
Message left on Triage VM, patient was recently seen, still with pain. Patient has questions about her last visit and would like for MD to give her a call

## 2012-03-21 NOTE — Telephone Encounter (Addendum)
Spoke with patient, patient states pain is above the belly button and radiates a little to the left. Patient states pain is slightly better today. Patient states anytime today is ok for the U/S patient has an engagement at 3 pm but is willing to change it if needed to get this issue addressed

## 2012-03-21 NOTE — Telephone Encounter (Signed)
Discussed with patient and she voiced understanding. Both the [place] and the Ref apt has been scheduled     KP

## 2012-03-21 NOTE — Telephone Encounter (Signed)
Referral and [place] ordered- please let pt know we will call her w/ her appt time

## 2012-03-22 ENCOUNTER — Ambulatory Visit (HOSPITAL_BASED_OUTPATIENT_CLINIC_OR_DEPARTMENT_OTHER)
Admission: RE | Admit: 2012-03-22 | Discharge: 2012-03-22 | Disposition: A | Discharge status: Home / Self Care | Payer: BC Managed Care – PPO | Source: Ambulatory Visit | Attending: Family Medicine | Admitting: Family Medicine

## 2012-03-22 DIAGNOSIS — R1013 Epigastric pain: Secondary | ICD-10-CM | POA: Insufficient documentation

## 2012-03-28 ENCOUNTER — Encounter: Payer: Self-pay | Admitting: Internal Medicine

## 2012-04-01 NOTE — Assessment & Plan Note (Signed)
New.  Suspect GERD/gastritis/PUD.  Check labs to r/o h pylori.  Start PPI.  Check amylase/lipase to r/o pancreatitis, LFTs to r/o biliary abnormality.  Reviewed supportive care and red flags that should prompt return.  Pt expressed understanding and is in agreement w/ plan.

## 2012-04-01 NOTE — Assessment & Plan Note (Signed)
New to provider.  Pt w/ hx of untreated TMJ.  Encouraged her to f/u w/ dentist.  PE today WNL.

## 2012-04-03 ENCOUNTER — Ambulatory Visit (INDEPENDENT_AMBULATORY_CARE_PROVIDER_SITE_OTHER): Payer: BC Managed Care – PPO | Admitting: Internal Medicine

## 2012-04-03 ENCOUNTER — Encounter: Payer: Self-pay | Admitting: Internal Medicine

## 2012-04-03 VITALS — BP 126/82 | HR 74 | Ht 64.0 in | Wt 214.4 lb

## 2012-04-03 DIAGNOSIS — K3189 Other diseases of stomach and duodenum: Secondary | ICD-10-CM

## 2012-04-03 DIAGNOSIS — R1013 Epigastric pain: Secondary | ICD-10-CM

## 2012-04-03 DIAGNOSIS — R109 Unspecified abdominal pain: Secondary | ICD-10-CM

## 2012-04-03 MED ORDER — OMEPRAZOLE 20 MG PO CPDR: 40.0000 mg | DELAYED_RELEASE_CAPSULE | Freq: Every day | ORAL | Status: DC

## 2012-04-03 NOTE — Progress Notes (Signed)
Patient ID: [name], female   DOB: 1971/01/14, 42 y.o.   MRN: [id number]  SUBJECTIVE: HPI [name] is a 42 yo female with PMH of chronic back pain, hypertension, dysfunction uterine bleeding status post hysterectomy and bilateral salpingo-oophorectomy who is seen in consultation at the request of Dr. Beverely Low for evaluation of abdominal pain. She is alone today. The patient describes 2 separate issues with abdominal pain. First she has noticed a bulge and fullness left of her umbilicus which is been present since October when she had her hysterectomy. She reports this as a soreness and seems to fluctuate in intensity. She feels that she sees and can palpate a bulge which also seems to change size. Is worse when she bends over to lift something up or changes position. Occasionally she's felt like bowel movement has made it better. Separate from this she describes an epigastric pain which doesn't always relate to eating. She denies early satiety. She has a rare gagging sensation but no nausea or vomiting. The epigastric pain tends to be worse in the morning. She does have a history of heartburn worse during pregnancy and has noted some acid reflux and water brash. She was started on omeprazole 20 mg by her primary care doctor and has found this to be helpful in her upper abdominal symptoms less troublesome, but not totally resolved. She has been taking omeprazole with breakfast.  no change in bowel habits, diarrhea or constipation. No blood in her stool or melena. No fevers or chills, no rashes, new joint pains, no mouth ulcers.  She does take ibuprofen on occasion for headaches.  No weight loss   Review of Systems  As per history of present illness, otherwise negative   Past Medical History  Diagnosis Date  . H/O varicella     As an infant.  . Chronic back pain     H/O  . Fibroid     During first pregnancy  . GBS carrier   . Rh negative status during pregnancy   . Irregular uterine  bleeding   . Cancer 2002    Left leg  . Monilial vaginitis 07/27/04  . First trimester bleeding 2008  . Constipation in pregnancy 03/2006  . AMA (advanced maternal age) multigravida 35+   . H/O amenorrhea 10/2006  . Acute mastitis of left breast 10/04/2007  . Hypertension     history of HTN with pregnancy  . Fibromyalgia     Current Outpatient Prescriptions  Medication Sig Dispense Refill  . Beclomethasone Dipropionate (QNASL) 80 MCG/ACT AERS Place 2 Squirts into the nose daily. Each nostril      . estradiol (MINIVELLE) 0.05 MG/24HR Place 1 patch (0.05 mg total) onto the skin 2 (two) times a week.  8 patch  12  . Fexofenadine HCl (ALLEGRA PO) Take 1 tablet by mouth daily. otc      . ibuprofen (ADVIL,MOTRIN) 600 MG tablet Take 1 tablet (600 mg total) by mouth every 6 (six) hours as needed for pain.  30 tablet  0  . multivitamin (THERAGRAN) per tablet Take 1 tablet by mouth daily.        [name] omeprazole (PRILOSEC) 20 MG capsule Take 2 capsules (40 mg total) by mouth daily.  60 capsule  3    Allergies  Allergen Reactions  . Betadine (Povidone Iodine)     Dx with allergy testing  . Cymbalta (Duloxetine Hcl)     Makes her feel crazy, out of body  . Shellfish Allergy  Dx with allergy testing    Family History  Problem Relation Age of Onset  . Hypertension Mother   . Cancer Mother     breast  . Cancer Father     lyphoma  . Heart disease Father     atrial fib  . Hypertension Father   . Cancer Maternal Grandmother     breast  . Heart disease Maternal Grandfather     MI  . Heart disease Paternal Grandfather     MI  . Cancer Maternal Aunt     ovarian    History  Substance Use Topics  . Smoking status: Never Smoker   . Smokeless tobacco: Never Used  . Alcohol Use: No    OBJECTIVE: BP 126/82  Pulse 74  Ht 5\' 4"  (1.626 m)  Wt 214 lb 6.4 oz (97.251 kg)  BMI 36.80 kg/m2  LMP 11/19/2010 Constitutional: Well-developed and well-nourished. No distress. HEENT: Normocephalic  and atraumatic. Oropharynx is clear and moist. No oropharyngeal exudate. Conjunctivae are normal. No scleral icterus. Neck: Neck supple. Trachea midline. Cardiovascular: Normal rate, regular rhythm and intact distal pulses. No M/R/G Pulmonary/chest: Effort normal and breath sounds normal. No wheezing, rales or rhonchi. Abdominal: Soft, nontender, nondistended. Bowel sounds active throughout. there is a palpable density on the anterior abdominal wall immediately superior and lateral to the umbilicus where there is also a well-healed laparoscopic scar.this is palpable with the patient standing and during Valsalva, but not when lying flat with a relaxed abdomen.   There are no other abdominal masses palpable Extremities: no clubbing, cyanosis, or edema Lymphadenopathy: No cervical adenopathy noted. Neurological: Alert and oriented to person place and time. Skin: Skin is warm and dry. No rashes noted. Psychiatric: Normal mood and affect. Behavior is normal.  Labs and Imaging -- CBC    Component Value Date/Time   WBC 5.7 03/15/2012 1224   RBC 4.41 03/15/2012 1224   HGB 12.9 03/15/2012 1224   HCT 38.1 03/15/2012 1224   PLT 331.0 03/15/2012 1224   MCV 86.4 03/15/2012 1224   MCH 28.8 12/09/2011 0802   MCHC 33.9 03/15/2012 1224   RDW 13.3 03/15/2012 1224   LYMPHSABS 1.8 03/15/2012 1224   MONOABS 0.3 03/15/2012 1224   EOSABS 0.2 03/15/2012 1224   BASOSABS 0.0 03/15/2012 1224    CMP     Component Value Date/Time   NA 139 03/15/2012 1224   K 4.0 03/15/2012 1224   CL 105 03/15/2012 1224   CO2 28 03/15/2012 1224   GLUCOSE 107* 03/15/2012 1224   BUN 12 03/15/2012 1224   CREATININE 0.5 03/15/2012 1224   CALCIUM 9.1 03/15/2012 1224   PROT 6.9 03/15/2012 1224   ALBUMIN 4.2 03/15/2012 1224   AST 19 03/15/2012 1224   ALT 28 03/15/2012 1224   ALKPHOS 60 03/15/2012 1224   BILITOT 0.5 03/15/2012 1224   GFRNONAA 138.00 02/25/2010 1027   GFRAA  Value: >60        The eGFR has been calculated using the MDRD equation.  This calculation has not been validated in all clinical 07/28/2007 1446   COMPLETE ABDOMINAL ULTRASOUND -- Jan 2014   Comparison:  None.   Findings:   Gallbladder:  No gallstones, gallbladder wall thickening, or pericholecystic fluid. Negative sonographic Murphy's sign.   Common bile duct:  Normal.  3 mm in diameter.   Liver:  Slightly echogenic liver parenchyma suggesting mild hepatic steatosis.  No focal lesions.   IVC:  Only a small portion of the inferior vena cava  was visible but that portion appears normal.   Pancreas:  Normal.   Spleen:  Normal.  5.3 cm in length.   Right Kidney:  Normal.  11.9 cm in length.   Left Kidney:  Normal.  12.3 cm in length.   Abdominal aorta:  Limited exam due to bowel gas.  Maximum visible diameters 2.5 cm.   IMPRESSION: No acute abnormalities.  Normal gallbladder.  Probable mild hepatic steatosis.   ASSESSMENT AND PLAN:  42 yo female with PMH of chronic back pain, hypertension, dysfunction uterine bleeding status post hysterectomy and bilateral salpingo-oophorectomy who is seen in consultation at the request of Dr. Beverely Low for evaluation of abdominal pain.   1.  Dyspepsia/GERD -- it appears the patient is describing 2 separate symptoms. Her upper GI symptoms and epigastric discomfort seem to relate more to dyspepsia or potentially gastroduodenitis.  H. pylori serum antibody was checked by PCP and negative. Her ibuprofen use could worsen these symptoms. She's had a moderate response to omeprazole 20 mg and there are no other alarm symptoms. I would like to increase her Prilosec to 40 mg once daily and she is instructed to take this at least 30 minutes before her first melena day. I like her to take this dose for another 6 weeks to see if her symptoms completely resolve. If they do then I do not think EGD is necessary. We can discuss this further at followup. She will try to avoid NSAIDs  2.  Anterior abdominal wall pain/fullness -- the  palpable density left of her umbilicus may be scar tissue or even a small hernia after her robotic hysterectomy.  This feels more superficial on examination and is not palpable with her lying flat.  She has thought a lot about this "spot" and we discussed further imaging both to try to determine etiology and put her mind at ease.  No lesion was seen by recent ultrasound.  We will perform a CT scan of the abdomen and pelvis with contrast to better evaluate both the left anterior abdominal wall and internal organs in cross section.  We discussed the imaging test and she agrees to proceed.  To see her back in 6 weeks sooner if necessary

## 2012-04-03 NOTE — Patient Instructions (Addendum)
We have sent the following medications to your pharmacy for you to pick up at your convenience: Omeprazole 40mg  daily  We will call you tomorrow to schedule your CT.

## 2012-04-04 ENCOUNTER — Telehealth: Payer: Self-pay | Admitting: Obstetrics and Gynecology

## 2012-04-04 ENCOUNTER — Telehealth: Payer: Self-pay | Admitting: Gastroenterology

## 2012-04-04 NOTE — Telephone Encounter (Signed)
Tc from pt. Pt c/o no libido. Pt with total hyst in 11/2011. Pt currently using Estradiol patch 0.05mg  twice weekly. Will consult with [facility] per recs. Pt also wants to let [facility] know that she has a CT scan of abd/pelvis sched on 04/09/12 per GI for a "mass on the left of belly button". Pt states,"told may be a kind of hernia". Will cb with recs per [facility]. Pt agrees.

## 2012-04-04 NOTE — Telephone Encounter (Signed)
Called pt told her she has her CT scheduled on 04/12/2012 gave her the following instructions.  Pt. Stated understanding  You have been scheduled for a CT scan of the abdomen and pelvis at [facility] ([address]---this is in the same building as [facility]).   You are scheduled on 04/12/2012 at 11:00am. You should arrive 15 minutes prior to your appointment time for registration. Please follow the written instructions below on the day of your exam:  WARNING: IF YOU ARE ALLERGIC TO IODINE/X-RAY DYE, PLEASE NOTIFY RADIOLOGY IMMEDIATELY AT [id number]! YOU WILL BE GIVEN A 13 HOUR PREMEDICATION PREP.  1) Do not eat or drink anything after 7:00am (4 hours prior to your test) 2) You have been given 2 bottles of oral contrast to drink. The solution may taste better if refrigerated, but do NOT add ice or any other liquid to this solution. Shake  well before drinking.    Drink 1 bottle of contrast @ 9:00 (2 hours prior to your exam)  Drink 1 bottle of contrast @ 10:00 (1 hour prior to your exam)  You may take any medications as prescribed with a small amount of water except for the following: Metformin, Glucophage, Glucovance, Avandamet, Riomet, Fortamet, Actoplus Met, Janumet, Glumetza or Metaglip. The above medications must be held the day of the exam AND 48 hours after the exam.  The purpose of you drinking the oral contrast is to aid in the visualization of your intestinal tract. The contrast solution may cause some diarrhea. Before your exam is started, you will be given a small amount of fluid to drink. Depending on your individual set of symptoms, you may also receive an intravenous injection of x-ray contrast/dye. Plan on being at [facility] for 30 minutes or long, depending on the type of exam you are having performed.  If you have any questions regarding your exam or if you need to reschedule, you may call the CT department at [phone] between the hours of  8:00 am and 5:00 pm, Monday-Friday.  ________________________________________________________________________

## 2012-04-05 ENCOUNTER — Telehealth: Payer: Self-pay

## 2012-04-05 DIAGNOSIS — O039 Complete or unspecified spontaneous abortion without complication: Secondary | ICD-10-CM | POA: Insufficient documentation

## 2012-04-05 DIAGNOSIS — C801 Malignant (primary) neoplasm, unspecified: Secondary | ICD-10-CM | POA: Insufficient documentation

## 2012-04-05 DIAGNOSIS — I10 Essential (primary) hypertension: Secondary | ICD-10-CM | POA: Insufficient documentation

## 2012-04-05 NOTE — Telephone Encounter (Signed)
Tc from pt per telephone call. Appt sched 04/06/12 @ 11:15a with [facility] for eval of no libido. Pt agrees.

## 2012-04-05 NOTE — Telephone Encounter (Signed)
Lm on vm to cb per [facility] recs.

## 2012-04-06 ENCOUNTER — Ambulatory Visit: Payer: BC Managed Care – PPO | Admitting: Obstetrics and Gynecology

## 2012-04-06 ENCOUNTER — Encounter: Payer: Self-pay | Admitting: Obstetrics and Gynecology

## 2012-04-06 VITALS — BP 122/64 | Ht 64.0 in | Wt 211.0 lb

## 2012-04-06 DIAGNOSIS — F520 Hypoactive sexual desire disorder: Secondary | ICD-10-CM | POA: Insufficient documentation

## 2012-04-06 NOTE — Progress Notes (Signed)
GYN PROBLEM VISIT  Subjective: [name] is a 42 y.o. year old female,[id number], who presents for a problem visit. She underwent a robotically assisted hysterectomy and BSO for abnormal uterine bleeding, presumed endometriosis.   She now complains of lack of libido.  She continues to enjoy sex, and denies any discomfort after her surgery.  She is concerned that she never initiates sex, and almost never thinks of sex.  This is different than prior to her surgery. Hot flashes are well controlled with an estrogen patch.  She denies vaginal dryness. 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  Pt wants to use OTC DHEA, but may decide on testosterone therapy if no improvement Written info on HSDD given Return to office prn if symptoms worsen or fail to improve.   Natalie Forth, MD  04/06/2012 11:23 AM

## 2012-04-09 ENCOUNTER — Ambulatory Visit (INDEPENDENT_AMBULATORY_CARE_PROVIDER_SITE_OTHER)
Admission: RE | Admit: 2012-04-09 | Discharge: 2012-04-09 | Disposition: A | Discharge status: Home / Self Care | Payer: BC Managed Care – PPO | Source: Ambulatory Visit | Attending: Internal Medicine | Admitting: Internal Medicine

## 2012-04-09 ENCOUNTER — Telehealth: Payer: Self-pay | Admitting: *Deleted

## 2012-04-09 DIAGNOSIS — K3189 Other diseases of stomach and duodenum: Secondary | ICD-10-CM

## 2012-04-09 DIAGNOSIS — R1013 Epigastric pain: Secondary | ICD-10-CM

## 2012-04-09 MED ORDER — IOHEXOL 300 MG/ML  SOLN
100.0000 mL | Freq: Once | INTRAMUSCULAR | Status: AC | PRN
  Administered 2012-04-09: 100 mL via INTRAVENOUS

## 2012-04-09 NOTE — Telephone Encounter (Signed)
Pt decided she would like see a surgeon just to discuss her options about hernia repair. Informed pt she is scheduled with Dr Derrell Lolling on 04/12/12, be there at 0920am. Pt stated understanding.

## 2012-04-11 ENCOUNTER — Telehealth (INDEPENDENT_AMBULATORY_CARE_PROVIDER_SITE_OTHER): Payer: Self-pay | Admitting: General Surgery

## 2012-04-11 NOTE — Telephone Encounter (Signed)
Called to let pt know that our office is closed 2/13 and would need to r/s.Marland KitchenMarland KitchenLMOM on pt's cell phone

## 2012-04-12 ENCOUNTER — Other Ambulatory Visit: Payer: BC Managed Care – PPO

## 2012-04-12 ENCOUNTER — Ambulatory Visit (INDEPENDENT_AMBULATORY_CARE_PROVIDER_SITE_OTHER): Payer: Self-pay | Admitting: General Surgery

## 2012-04-23 ENCOUNTER — Ambulatory Visit (INDEPENDENT_AMBULATORY_CARE_PROVIDER_SITE_OTHER): Payer: BC Managed Care – PPO | Admitting: Surgery

## 2012-04-23 ENCOUNTER — Encounter (INDEPENDENT_AMBULATORY_CARE_PROVIDER_SITE_OTHER): Payer: Self-pay | Admitting: Surgery

## 2012-04-23 VITALS — BP 100/78 | HR 75 | Temp 96.4°F | Resp 18 | Ht 64.0 in | Wt 208.8 lb

## 2012-04-23 DIAGNOSIS — K439 Ventral hernia without obstruction or gangrene: Secondary | ICD-10-CM

## 2012-04-23 NOTE — Patient Instructions (Addendum)
Hernia Repair with Laparoscope A hernia occurs when an internal organ pushes out through a weak spot in the belly (abdominal) wall muscles. Hernias most commonly occur in the groin and around the navel. Hernias can also occur through a cut by the surgeon (incision) after an abdominal operation. A hernia may be caused by:  Lifting heavy objects.  Prolonged coughing.  Straining to move your bowels. Hernias can often be pushed back into place (reduced). Most hernias tend to get worse over time. Problems occur when abdominal contents get stuck in the opening and the blood supply is blocked or impaired (incarcerated hernia). Because of these risks, you require surgery to repair the hernia. Your hernia will be repaired using a laparoscope. Laparoscopic surgery is a type of minimally invasive surgery. It does not involve making a typical surgical cut (incision) in the skin. A laparoscope is a telescope-like rod and lens system. It is usually connected to a video camera and a light source so your caregiver can clearly see the operative area. The instruments are inserted through  to  inch (5 mm or 10 mm) openings in the skin at specific locations. A working and viewing space is created by blowing a small amount of carbon dioxide gas into the abdominal cavity. The abdomen is essentially blown up like a balloon (insufflated). This elevates the abdominal wall above the internal organs like a dome. The carbon dioxide gas is common to the human body and can be absorbed by tissue and removed by the respiratory system. Once the repair is completed, the small incisions will be closed with either stitches (sutures) or staples (just like a paper stapler only this staple holds the skin together). LET YOUR CAREGIVERS KNOW ABOUT:  Allergies.  Medications taken including herbs, eye drops, over the counter medications, and creams.  Use of steroids (by mouth or creams).  Previous problems with anesthetics or  Novocaine.  Possibility of pregnancy, if this applies.  History of blood clots (thrombophlebitis).  History of bleeding or blood problems.  Previous surgery.  Other health problems. BEFORE THE PROCEDURE  Laparoscopy can be done either in a hospital or out-patient clinic. You may be given a mild sedative to help you relax before the procedure. Once in the operating room, you will be given a general anesthesia to make you sleep (unless you and your caregiver choose a different anesthetic).  AFTER THE PROCEDURE  After the procedure you will be watched in a recovery area. Depending on what type of hernia was repaired, you might be admitted to the hospital or you might go home the same day. With this procedure you may have less pain and scarring. This usually results in a quicker recovery and less risk of infection. HOME CARE INSTRUCTIONS   Bed rest is not required. You may continue your normal activities but avoid heavy lifting (more than 10 pounds) or straining.  Cough gently. If you are a smoker it is best to stop, as even the best hernia repair can break down with the continual strain of coughing.  Avoid driving until given the OK by your surgeon.  There are no dietary restrictions unless given otherwise.  TAKE ALL MEDICATIONS AS DIRECTED.  Only take over-the-counter or prescription medicines for pain, discomfort, or fever as directed by your caregiver. SEEK MEDICAL CARE IF:   There is increasing abdominal pain or pain in your incisions.  There is more bleeding from incisions, other than minimal spotting.  You feel light headed or faint.  You   develop an unexplained fever, chills, and/or an oral temperature above 102 F (38.9 C).  You have redness, swelling, or increasing pain in the wound.  Pus coming from wound.  A foul smell coming from the wound or dressings. SEEK IMMEDIATE MEDICAL CARE IF:   You develop a rash.  You have difficulty breathing.  You have any  allergic problems. MAKE SURE YOU:   Understand these instructions.  Will watch your condition.  Will get help right away if you are not doing well or get worse. Document Released: 02/14/2005 Document Revised: 05/09/2011 Document Reviewed: 01/14/2009 Exit[employer] Patient Information 2013 [employer].  

## 2012-04-23 NOTE — Progress Notes (Signed)
Patient ID: [name], female   DOB: June 24, 1970, 42 y.o.   MRN: [id number]  Chief Complaint  Patient presents with  . New Evaluation    eval umb hernia    HPI [name] is a 42 y.o. female.  Patient sent at the request of Dr.Pyrtle for abdominal wall hernia. She's been worked up for abdominal pain, nausea, heartburn and dyspepsia. She developed a bulge along the left side of her abdominal wall skin present for one month. She to recent robotic hysterectomy and salpingo-oophorectomy. The bulge cause discomfort with lifting, pushing, or standing for long periods time. Occasional nausea. HPI  Past Medical History  Diagnosis Date  . H/O varicella     As an infant.  . Chronic back pain     H/O  . Fibroid     During first pregnancy  . GBS carrier   . Rh negative status during pregnancy   . Irregular uterine bleeding   . Cancer 2002    Left leg  . Monilial vaginitis 07/27/04  . First trimester bleeding 2008  . Constipation in pregnancy 03/2006  . AMA (advanced maternal age) multigravida 35+   . H/O amenorrhea 10/2006  . Acute mastitis of left breast 10/04/2007  . Hypertension     history of HTN with pregnancy  . Fibromyalgia   . Endometriosis     h/o  . SAB (spontaneous abortion)     h/o x 2    Past Surgical History  Procedure Laterality Date  . Wisdom tooth extraction  1989  . Appendectomy  2002  . Endocervical polyp removal  2001  . Melanoma removal   2003  . Uterine curettage  01/2009  . Endometrial ablation  2010  . Hysteroscopy w/ endometrial ablation  01/28/2009  . Tendon release  2004  . Salpingoophorectomy  12/08/2011    Procedure: SALPINGO OOPHERECTOMY;  Surgeon: Hal Morales, MD;  Location: [facility];  Service: Gynecology;  Laterality: Bilateral;  . Dilation and evacuation    . Abdominal hysterectomy  2013  . Melanoma excision  2002    Left Leg    Family History  Problem Relation Age of Onset  . Hypertension Mother   . Cancer Mother     breast    . Heart disease Father     atrial fib  . Hypertension Father   . Cancer Father     Lyphoma,Bladder  . Atrial fibrillation Father   . Cancer Maternal Grandmother     breast  . Heart disease Maternal Grandfather     MI  . Heart disease Paternal Grandfather     MI  . Cancer Maternal Aunt     ovarian  . Diverticulitis Brother     Social History History  Substance Use Topics  . Smoking status: Former Smoker    Types: Cigarettes    Quit date: 02/29/1988  . Smokeless tobacco: Never Used  . Alcohol Use: No    Allergies  Allergen Reactions  . Betadine (Povidone Iodine)     Dx with allergy testing  . Cymbalta (Duloxetine Hcl)     Makes her feel crazy, out of body  . Shellfish Allergy     Dx with allergy testing    Current Outpatient Prescriptions  Medication Sig Dispense Refill  . Beclomethasone Dipropionate (QNASL) 80 MCG/ACT AERS Place 2 Squirts into the nose daily. Each nostril      . estradiol (MINIVELLE) 0.05 MG/24HR Place 1 patch (0.05 mg total) onto the skin  2 (two) times a week.  8 patch  12  . Fexofenadine HCl (ALLEGRA PO) Take 1 tablet by mouth daily. otc      . ibuprofen (ADVIL,MOTRIN) 600 MG tablet Take 1 tablet (600 mg total) by mouth every 6 (six) hours as needed for pain.  30 tablet  0  . multivitamin (THERAGRAN) per tablet Take 1 tablet by mouth daily.        [name] omeprazole (PRILOSEC) 20 MG capsule Take 2 capsules (40 mg total) by mouth daily.  60 capsule  3   No current facility-administered medications for this visit.    Review of Systems Review of Systems  Constitutional: Negative for fever, chills and unexpected weight change.  HENT: Negative for hearing loss, congestion, sore throat, trouble swallowing and voice change.   Eyes: Negative for visual disturbance.  Respiratory: Negative for cough and wheezing.   Cardiovascular: Negative for chest pain, palpitations and leg swelling.  Gastrointestinal: Negative for nausea, vomiting, abdominal pain,  diarrhea, constipation, blood in stool, abdominal distention and anal bleeding.  Genitourinary: Negative for hematuria, vaginal bleeding and difficulty urinating.  Musculoskeletal: Negative for arthralgias.  Skin: Negative for rash and wound.  Neurological: Negative for seizures, syncope and headaches.  Hematological: Negative for adenopathy. Does not bruise/bleed easily.  Psychiatric/Behavioral: Negative for confusion.    Blood pressure 100/78, pulse 75, temperature 96.4 F (35.8 C), temperature source Temporal, resp. rate 18, height 5\' 4"  (1.626 m), weight 208 lb 12.8 oz (94.711 kg), last menstrual period 11/19/2010.  Physical Exam Physical Exam  Constitutional: She is oriented to person, place, and time. She appears well-developed and well-nourished.  HENT:  Head: Normocephalic and atraumatic.  Eyes: EOM are normal. Pupils are equal, round, and reactive to light.  Neck: Normal range of motion. Neck supple.  Cardiovascular: Normal rate and regular rhythm.   Pulmonary/Chest: Effort normal and breath sounds normal.  Abdominal: Soft. There is no tenderness. A hernia is present. Hernia confirmed positive in the ventral area.    Musculoskeletal: Normal range of motion.  Neurological: She is alert and oriented to person, place, and time.  Skin: Skin is warm and dry.  Psychiatric: She has a normal mood and affect. Her behavior is normal. Thought content normal.    Data Reviewed Clinical Data: Palpable, hard area to the left of the umbilicus.  CT ABDOMEN AND PELVIS WITH CONTRAST  Technique: Multidetector CT imaging of the abdomen and pelvis was  performed following the standard protocol during bolus  administration of intravenous contrast.  Contrast: OMNIPAQUE IOHEXOL 300 MG/ML SOLN  Comparison: None.  Findings: No focal abnormalities seen in the liver or spleen. The  stomach, duodenum, pancreas, gallbladder, and adrenal glands are  unremarkable. The kidneys have normal  imaging features  bilaterally.  No abdominal aortic aneurysm. There is no free fluid or  lymphadenopathy in the abdomen.  Just to the left of the umbilicus, a fascial defect is identified,  measuring approximately 2.5 x 2.4 cm. Omental fat projects through  this fascial defect creating a hernia sac measuring about 6.6 x 5.7  x 4.0 cm. No bowel within the hernia sac. No edema or fluid  within the hernia sac to suggest fatty incarceration.  Imaging through the pelvis shows no intraperitoneal free fluid.  There is no pelvic sidewall lymphadenopathy. The uterus is  surgically absent. There is no adnexal mass. The terminal ileum  is normal. Nonvisualization of the appendix is consistent with the  reported history of appendectomy.  Bone windows  reveal no worrisome lytic or sclerotic osseous  lesions.  IMPRESSION:  Left paraumbilical hernia as described. The hernia sac contains  only omental fat without evidence for fatty incarceration or  infarction.  Original Report Authenticated By: Kennith Center, M.D.   Assessment    Ventral hernia symptomatic    Plan    Discussed laparoscopic versus open ventral hernia repair. Discussed use of mesh. Risks, benefits and alternative therapies discussed. She like to proceed with laparoscopic ventral hernia. Mesh.The risk of hernia repair include bleeding,  Infection,   Recurrence of the hernia,  Mesh use, chronic pain,  Organ injury,  Bowel injury,  Bladder injury,   nerve injury with numbness around the incision,  Death,  and worsening of preexisting  medical problems.  The alternatives to surgery have been discussed as well..  Long term expectations of both operative and non operative treatments have been discussed.   The patient agrees to proceed.       Amori Colomb A. 04/23/2012, 10:24 AM

## 2012-04-27 ENCOUNTER — Telehealth: Payer: Self-pay | Admitting: Internal Medicine

## 2012-04-27 NOTE — Telephone Encounter (Signed)
Pt seen 04/03/12 for reflux and what proved to be a Hernia r/t recent hysterectomy. She will have surgery on 05/16/12. She was also to increase Prilosec to 40 mg daily, which she did. She had used OTC Prilosec until she ran out 3 days ago. She was OK for 2 days, but now she is experiencing burping and epigastric discomfort. She states she thought she was better, but she did pick up the script we had called in. We discussed rebound reflux and since she's having surgery, maybe she should remain on the PPI. She will call back after surgery and I informed her maybe Dr Rhea Belton can suggest a way to wean off the drug to prevent another rebound problem. Pt stated understanding.

## 2012-04-30 NOTE — Telephone Encounter (Signed)
Agree, to remain on PPI until after surgery. She should call back afterwards and we can recommend tapering the dose to help prevent rebound heartburn, and likely include H2 blocker as she weans off PPI

## 2012-05-02 ENCOUNTER — Encounter (HOSPITAL_COMMUNITY): Payer: Self-pay

## 2012-05-08 ENCOUNTER — Encounter (HOSPITAL_COMMUNITY)
Admission: RE | Admit: 2012-05-08 | Discharge: 2012-05-08 | Disposition: A | Discharge status: Home / Self Care | Payer: BC Managed Care – PPO | Source: Ambulatory Visit | Attending: Surgery | Admitting: Surgery

## 2012-05-08 ENCOUNTER — Encounter (HOSPITAL_COMMUNITY): Payer: Self-pay

## 2012-05-08 HISTORY — DX: Adverse effect of unspecified anesthetic, initial encounter: T41.45XA

## 2012-05-08 HISTORY — DX: Headache: R51

## 2012-05-08 HISTORY — DX: Other complications of anesthesia, initial encounter: T88.59XA

## 2012-05-08 HISTORY — DX: Other allergy status, other than to drugs and biological substances: Z91.09

## 2012-05-08 HISTORY — DX: Arthralgia of temporomandibular joint, unspecified side: M26.629

## 2012-05-08 HISTORY — DX: Cardiac murmur, unspecified: R01.1

## 2012-05-08 HISTORY — DX: Other seasonal allergic rhinitis: J30.2

## 2012-05-08 HISTORY — DX: Gastro-esophageal reflux disease without esophagitis: K21.9

## 2012-05-08 LAB — CBC
HCT: 39.7 % (ref 36.0–46.0)
Hemoglobin: 14 g/dL (ref 12.0–15.0)
MCH: 29.4 pg (ref 26.0–34.0)
MCHC: 35.3 g/dL (ref 30.0–36.0)
MCV: 83.4 fL (ref 78.0–100.0)
Platelets: 278 K/uL (ref 150–400)
RBC: 4.76 MIL/uL (ref 3.87–5.11)
RDW: 13.1 % (ref 11.5–15.5)
WBC: 5.3 K/uL (ref 4.0–10.5)

## 2012-05-08 LAB — SURGICAL PCR SCREEN
MRSA, PCR: NEGATIVE
Staphylococcus aureus: NEGATIVE

## 2012-05-10 ENCOUNTER — Ambulatory Visit (INDEPENDENT_AMBULATORY_CARE_PROVIDER_SITE_OTHER): Payer: Self-pay | Admitting: Surgery

## 2012-05-10 ENCOUNTER — Telehealth: Payer: Self-pay | Admitting: Internal Medicine

## 2012-05-11 NOTE — Telephone Encounter (Signed)
Pt reports her problem may be related to her hernia; she is scheduled for surgery. She has a lot of pain way up high between her breasts; she does take omeprazole. Also she has terriible bloating and gas- upper and lower. Pt wants to know if this is r/t her hernia or do we have anything to offer? Thanks.

## 2012-05-11 NOTE — Telephone Encounter (Signed)
Pt will call back

## 2012-05-14 NOTE — Telephone Encounter (Signed)
As we discussed in clinic on 04/03/12 she has an umbilical hernia which is now scheduled to be fixed. I increased her omeprazole from 20 mg daily to 40 mg daily.  Has this helped?  I doubt her upper abd pain is related to her umbilical hernia.  We had discussed EGD if symptoms did not respond to increased dose of omeprazole Therefore if symptoms has persisted, I recommend EGD.  This can be done before of after surgery.  If after, then at least 2 weeks after and when okay with her surgeon.

## 2012-05-14 NOTE — Telephone Encounter (Signed)
Informed pt of Dr Lauro Franklin recommendations. She states the omeprazole has helped, it's just every once in while, she has this reported pain. She states her surgery is Wednesday, 05/16/12 and she will call afterwards if problems persist.

## 2012-05-15 MED ORDER — DEXTROSE 5 % IV SOLN
3.0000 g | INTRAVENOUS | Status: AC
  Administered 2012-05-16: 3 g via INTRAVENOUS
  Filled 2012-05-15: qty 3000

## 2012-05-16 ENCOUNTER — Encounter (HOSPITAL_COMMUNITY): Payer: Self-pay | Admitting: Certified Registered Nurse Anesthetist

## 2012-05-16 ENCOUNTER — Ambulatory Visit (HOSPITAL_COMMUNITY): Payer: BC Managed Care – PPO | Admitting: Anesthesiology

## 2012-05-16 ENCOUNTER — Observation Stay (HOSPITAL_COMMUNITY)
Admission: RE | Admit: 2012-05-16 | Discharge: 2012-05-18 | Disposition: A | Discharge status: Home / Self Care | Payer: BC Managed Care – PPO | Source: Ambulatory Visit | Attending: Surgery | Admitting: Surgery

## 2012-05-16 ENCOUNTER — Encounter (HOSPITAL_COMMUNITY): Payer: Self-pay | Admitting: Anesthesiology

## 2012-05-16 ENCOUNTER — Encounter (HOSPITAL_COMMUNITY)
Admission: RE | Disposition: A | Discharge status: Home / Self Care | Payer: Self-pay | Source: Ambulatory Visit | Attending: Surgery

## 2012-05-16 DIAGNOSIS — I808 Phlebitis and thrombophlebitis of other sites: Secondary | ICD-10-CM

## 2012-05-16 DIAGNOSIS — C801 Malignant (primary) neoplasm, unspecified: Secondary | ICD-10-CM

## 2012-05-16 DIAGNOSIS — R1013 Epigastric pain: Secondary | ICD-10-CM

## 2012-05-16 DIAGNOSIS — K430 Incisional hernia with obstruction, without gangrene: Principal | ICD-10-CM | POA: Insufficient documentation

## 2012-05-16 DIAGNOSIS — R209 Unspecified disturbances of skin sensation: Secondary | ICD-10-CM

## 2012-05-16 DIAGNOSIS — R42 Dizziness and giddiness: Secondary | ICD-10-CM

## 2012-05-16 DIAGNOSIS — K649 Unspecified hemorrhoids: Secondary | ICD-10-CM

## 2012-05-16 DIAGNOSIS — B373 Candidiasis of vulva and vagina: Secondary | ICD-10-CM

## 2012-05-16 DIAGNOSIS — Z8742 Personal history of other diseases of the female genital tract: Secondary | ICD-10-CM

## 2012-05-16 DIAGNOSIS — O039 Complete or unspecified spontaneous abortion without complication: Secondary | ICD-10-CM

## 2012-05-16 DIAGNOSIS — B3731 Acute candidiasis of vulva and vagina: Secondary | ICD-10-CM

## 2012-05-16 DIAGNOSIS — M62838 Other muscle spasm: Secondary | ICD-10-CM

## 2012-05-16 DIAGNOSIS — I10 Essential (primary) hypertension: Secondary | ICD-10-CM

## 2012-05-16 DIAGNOSIS — M549 Dorsalgia, unspecified: Secondary | ICD-10-CM

## 2012-05-16 DIAGNOSIS — M2550 Pain in unspecified joint: Secondary | ICD-10-CM

## 2012-05-16 DIAGNOSIS — F520 Hypoactive sexual desire disorder: Secondary | ICD-10-CM

## 2012-05-16 DIAGNOSIS — M797 Fibromyalgia: Secondary | ICD-10-CM

## 2012-05-16 DIAGNOSIS — C439 Malignant melanoma of skin, unspecified: Secondary | ICD-10-CM

## 2012-05-16 DIAGNOSIS — IMO0002 Reserved for concepts with insufficient information to code with codable children: Secondary | ICD-10-CM

## 2012-05-16 DIAGNOSIS — R51 Headache: Secondary | ICD-10-CM

## 2012-05-16 DIAGNOSIS — N809 Endometriosis, unspecified: Secondary | ICD-10-CM

## 2012-05-16 DIAGNOSIS — Z8041 Family history of malignant neoplasm of ovary: Secondary | ICD-10-CM

## 2012-05-16 DIAGNOSIS — R102 Pelvic and perineal pain unspecified side: Secondary | ICD-10-CM

## 2012-05-16 DIAGNOSIS — J019 Acute sinusitis, unspecified: Secondary | ICD-10-CM

## 2012-05-16 DIAGNOSIS — M79629 Pain in unspecified upper arm: Secondary | ICD-10-CM

## 2012-05-16 DIAGNOSIS — K439 Ventral hernia without obstruction or gangrene: Secondary | ICD-10-CM

## 2012-05-16 DIAGNOSIS — J309 Allergic rhinitis, unspecified: Secondary | ICD-10-CM

## 2012-05-16 HISTORY — PX: HERNIA REPAIR: SHX51

## 2012-05-16 HISTORY — PX: INSERTION OF MESH: SHX5868

## 2012-05-16 HISTORY — PX: VENTRAL HERNIA REPAIR: SHX424

## 2012-05-16 LAB — CBC
HCT: 38.5 % (ref 36.0–46.0)
Hemoglobin: 13.4 g/dL (ref 12.0–15.0)
MCHC: 34.8 g/dL (ref 30.0–36.0)
MCV: 83.7 fL (ref 78.0–100.0)

## 2012-05-16 LAB — CREATININE, SERUM
GFR calc Af Amer: >90 mL/min (ref >90)
GFR calc non Af Amer: >90 mL/min (ref >90)

## 2012-05-16 SURGERY — REPAIR, HERNIA, VENTRAL, LAPAROSCOPIC
Anesthesia: General | Site: Abdomen | Wound class: Clean

## 2012-05-16 MED ORDER — LIDOCAINE HCL (CARDIAC) 20 MG/ML IV SOLN
INTRAVENOUS | Status: DC | PRN
  Administered 2012-05-16: 100 mg via INTRAVENOUS

## 2012-05-16 MED ORDER — ONDANSETRON HCL 4 MG/2ML IJ SOLN
INTRAMUSCULAR | Status: DC | PRN
  Administered 2012-05-16: 4 mg via INTRAVENOUS

## 2012-05-16 MED ORDER — HYDROMORPHONE HCL PF 1 MG/ML IJ SOLN
INTRAMUSCULAR | Status: AC
  Administered 2012-05-16: 0.5 mg via INTRAVENOUS
  Filled 2012-05-16: qty 1

## 2012-05-16 MED ORDER — DEXTROSE-NACL 5-0.9 % IV SOLN
INTRAVENOUS | Status: DC
  Administered 2012-05-16 – 2012-05-17 (×2): via INTRAVENOUS

## 2012-05-16 MED ORDER — LACTATED RINGERS IV SOLN
INTRAVENOUS | Status: DC | PRN
  Administered 2012-05-16 (×3): via INTRAVENOUS

## 2012-05-16 MED ORDER — NEOSTIGMINE METHYLSULFATE 1 MG/ML IJ SOLN
INTRAMUSCULAR | Status: DC | PRN
  Administered 2012-05-16: 3 mg via INTRAVENOUS

## 2012-05-16 MED ORDER — MIDAZOLAM HCL 5 MG/5ML IJ SOLN
INTRAMUSCULAR | Status: DC | PRN
  Administered 2012-05-16: 2 mg via INTRAVENOUS

## 2012-05-16 MED ORDER — 0.9 % SODIUM CHLORIDE (POUR BTL) OPTIME
TOPICAL | Status: DC | PRN
  Administered 2012-05-16: 1000 mL

## 2012-05-16 MED ORDER — ACETAMINOPHEN 10 MG/ML IV SOLN
INTRAVENOUS | Status: AC
  Filled 2012-05-16: qty 100

## 2012-05-16 MED ORDER — ONDANSETRON HCL 4 MG/2ML IJ SOLN: 4.0000 mg | Freq: Four times a day (QID) | INTRAMUSCULAR | Status: DC | PRN

## 2012-05-16 MED ORDER — GLYCOPYRROLATE 0.2 MG/ML IJ SOLN
INTRAMUSCULAR | Status: DC | PRN
  Administered 2012-05-16: 0.4 mg via INTRAVENOUS

## 2012-05-16 MED ORDER — ENOXAPARIN SODIUM 40 MG/0.4ML ~~LOC~~ SOLN
40.0000 mg | SUBCUTANEOUS | Status: DC
  Administered 2012-05-17 – 2012-05-18 (×2): 40 mg via SUBCUTANEOUS
  Filled 2012-05-16 (×3): qty 0.4

## 2012-05-16 MED ORDER — KETOROLAC TROMETHAMINE 30 MG/ML IJ SOLN
30.0000 mg | Freq: Four times a day (QID) | INTRAMUSCULAR | Status: DC
  Administered 2012-05-16 – 2012-05-18 (×7): 30 mg via INTRAVENOUS
  Filled 2012-05-16 (×14): qty 1

## 2012-05-16 MED ORDER — OXYCODONE HCL 5 MG PO TABS: 5.0000 mg | ORAL_TABLET | Freq: Once | ORAL | Status: DC | PRN

## 2012-05-16 MED ORDER — BUPIVACAINE-EPINEPHRINE 0.25% -1:200000 IJ SOLN
INTRAMUSCULAR | Status: AC
  Filled 2012-05-16: qty 1

## 2012-05-16 MED ORDER — FENTANYL CITRATE 0.05 MG/ML IJ SOLN
INTRAMUSCULAR | Status: DC | PRN
  Administered 2012-05-16: 100 ug via INTRAVENOUS
  Administered 2012-05-16 (×3): 50 ug via INTRAVENOUS

## 2012-05-16 MED ORDER — ONDANSETRON HCL 4 MG/2ML IJ SOLN
4.0000 mg | INTRAMUSCULAR | Status: DC | PRN
  Administered 2012-05-17 (×2): 4 mg via INTRAVENOUS
  Filled 2012-05-16 (×2): qty 2

## 2012-05-16 MED ORDER — OXYCODONE-ACETAMINOPHEN 5-325 MG PO TABS
1.0000 | ORAL_TABLET | ORAL | Status: DC | PRN
  Administered 2012-05-17: 1 via ORAL
  Administered 2012-05-17 – 2012-05-18 (×2): 2 via ORAL
  Filled 2012-05-16: qty 2
  Filled 2012-05-16: qty 1
  Filled 2012-05-16: qty 2

## 2012-05-16 MED ORDER — PROPOFOL 10 MG/ML IV BOLUS
INTRAVENOUS | Status: DC | PRN
  Administered 2012-05-16: 180 mg via INTRAVENOUS
  Administered 2012-05-16: 50 mg via INTRAVENOUS

## 2012-05-16 MED ORDER — HYDROMORPHONE HCL PF 1 MG/ML IJ SOLN
0.2500 mg | INTRAMUSCULAR | Status: DC | PRN
  Administered 2012-05-16 (×2): 0.5 mg via INTRAVENOUS

## 2012-05-16 MED ORDER — ACETAMINOPHEN 10 MG/ML IV SOLN
1000.0000 mg | Freq: Once | INTRAVENOUS | Status: AC
  Administered 2012-05-16: 1000 mg via INTRAVENOUS

## 2012-05-16 MED ORDER — DEXAMETHASONE SODIUM PHOSPHATE 4 MG/ML IJ SOLN
INTRAMUSCULAR | Status: DC | PRN
  Administered 2012-05-16: 4 mg via INTRAVENOUS

## 2012-05-16 MED ORDER — ROCURONIUM BROMIDE 100 MG/10ML IV SOLN
INTRAVENOUS | Status: DC | PRN
  Administered 2012-05-16: 50 mg via INTRAVENOUS

## 2012-05-16 MED ORDER — BUPIVACAINE-EPINEPHRINE 0.25% -1:200000 IJ SOLN
INTRAMUSCULAR | Status: DC | PRN
  Administered 2012-05-16: 14 mL

## 2012-05-16 MED ORDER — HYDROMORPHONE HCL PF 1 MG/ML IJ SOLN
1.0000 mg | INTRAMUSCULAR | Status: DC | PRN
  Administered 2012-05-16 – 2012-05-17 (×4): 1 mg via INTRAVENOUS
  Filled 2012-05-16 (×4): qty 1

## 2012-05-16 MED ORDER — ACETAMINOPHEN 10 MG/ML IV SOLN
1000.0000 mg | Freq: Four times a day (QID) | INTRAVENOUS | Status: AC
  Administered 2012-05-16 – 2012-05-17 (×4): 1000 mg via INTRAVENOUS
  Filled 2012-05-16 (×4): qty 100

## 2012-05-16 MED ORDER — CHLORHEXIDINE GLUCONATE 4 % EX LIQD: 1.0000 "application " | Freq: Once | CUTANEOUS | Status: DC

## 2012-05-16 MED ORDER — OXYCODONE HCL 5 MG/5ML PO SOLN: 5.0000 mg | Freq: Once | ORAL | Status: DC | PRN

## 2012-05-16 SURGICAL SUPPLY — 54 items
APPLIER CLIP ROT 10 11.4 M/L (STAPLE)
BINDER ABD UNIV 12 45-62 (WOUND CARE) ×1 IMPLANT
BINDER ABDOMINAL 46IN 62IN (WOUND CARE) ×2
BLADE SURG 10 STRL SS (BLADE) ×2 IMPLANT
BLADE SURG ROTATE 9660 (MISCELLANEOUS) IMPLANT
CANISTER SUCTION 2500CC (MISCELLANEOUS) IMPLANT
CHLORAPREP W/TINT 26ML (MISCELLANEOUS) ×2 IMPLANT
CLIP APPLIE ROT 10 11.4 M/L (STAPLE) IMPLANT
CLOTH BEACON ORANGE TIMEOUT ST (SAFETY) ×2 IMPLANT
COVER SURGICAL LIGHT HANDLE (MISCELLANEOUS) ×2 IMPLANT
DECANTER SPIKE VIAL GLASS SM (MISCELLANEOUS) ×2 IMPLANT
DERMABOND ADVANCED (GAUZE/BANDAGES/DRESSINGS) ×1
DERMABOND ADVANCED .7 DNX12 (GAUZE/BANDAGES/DRESSINGS) ×1 IMPLANT
DEVICE SECURE STRAP 25 ABSORB (INSTRUMENTS) ×4 IMPLANT
DEVICE TROCAR PUNCTURE CLOSURE (ENDOMECHANICALS) ×2 IMPLANT
DRAPE UTILITY 15X26 W/TAPE STR (DRAPE) ×4 IMPLANT
DRAPE WARM FLUID 44X44 (DRAPE) ×2 IMPLANT
ELECT CAUTERY BLADE 6.4 (BLADE) ×2 IMPLANT
ELECT REM PT RETURN 9FT ADLT (ELECTROSURGICAL) ×2
ELECTRODE REM PT RTRN 9FT ADLT (ELECTROSURGICAL) ×1 IMPLANT
GLOVE BIO SURGEON STRL SZ 6.5 (GLOVE) ×2 IMPLANT
GLOVE BIO SURGEON STRL SZ7.5 (GLOVE) ×4 IMPLANT
GLOVE BIO SURGEON STRL SZ8 (GLOVE) ×2 IMPLANT
GLOVE BIOGEL PI IND STRL 7.5 (GLOVE) ×2 IMPLANT
GLOVE BIOGEL PI IND STRL 8 (GLOVE) ×1 IMPLANT
GLOVE BIOGEL PI INDICATOR 7.5 (GLOVE) ×2
GLOVE BIOGEL PI INDICATOR 8 (GLOVE) ×1
GOWN STRL NON-REIN LRG LVL3 (GOWN DISPOSABLE) ×4 IMPLANT
GOWN STRL REIN XL XLG (GOWN DISPOSABLE) ×2 IMPLANT
KIT BASIN OR (CUSTOM PROCEDURE TRAY) ×2 IMPLANT
KIT ROOM TURNOVER OR (KITS) ×2 IMPLANT
MARKER SKIN DUAL TIP RULER LAB (MISCELLANEOUS) ×2 IMPLANT
MESH PARIETEX 6X4 (Mesh General) ×2 IMPLANT
NEEDLE SPNL 22GX3.5 QUINCKE BK (NEEDLE) ×2 IMPLANT
NS IRRIG 1000ML POUR BTL (IV SOLUTION) ×2 IMPLANT
PAD ARMBOARD 7.5X6 YLW CONV (MISCELLANEOUS) ×2 IMPLANT
PENCIL BUTTON HOLSTER BLD 10FT (ELECTRODE) ×2 IMPLANT
SCALPEL HARMONIC ACE (MISCELLANEOUS) ×2 IMPLANT
SET IRRIG TUBING LAPAROSCOPIC (IRRIGATION / IRRIGATOR) IMPLANT
SLEEVE ENDOPATH XCEL 5M (ENDOMECHANICALS) ×6 IMPLANT
SUT MON AB 4-0 PC3 18 (SUTURE) ×2 IMPLANT
SUT NOVA NAB DX-16 0-1 5-0 T12 (SUTURE) ×4 IMPLANT
SUT VIC AB 3-0 SH 18 (SUTURE) ×2 IMPLANT
TOWEL OR 17X24 6PK STRL BLUE (TOWEL DISPOSABLE) IMPLANT
TOWEL OR 17X26 10 PK STRL BLUE (TOWEL DISPOSABLE) ×2 IMPLANT
TRAY FOLEY CATH 14FR (SET/KITS/TRAYS/PACK) ×2 IMPLANT
TRAY LAPAROSCOPIC (CUSTOM PROCEDURE TRAY) ×2 IMPLANT
TROCAR XCEL 12X100 BLDLESS (ENDOMECHANICALS) IMPLANT
TROCAR XCEL BLUNT TIP 100MML (ENDOMECHANICALS) ×2 IMPLANT
TROCAR XCEL NON-BLD 11X100MML (ENDOMECHANICALS) IMPLANT
TROCAR XCEL NON-BLD 5MMX100MML (ENDOMECHANICALS) ×2 IMPLANT
TUBE CONNECTING 12X1/4 (SUCTIONS) ×2 IMPLANT
WATER STERILE IRR 1000ML POUR (IV SOLUTION) IMPLANT
YANKAUER SUCT BULB TIP NO VENT (SUCTIONS) ×2 IMPLANT

## 2012-05-16 NOTE — Transfer of Care (Signed)
Immediate Anesthesia Transfer of Care Note  Patient: [name]  Procedure(s) Performed: Procedure(s): LAPAROSCOPIC VENTRAL HERNIA (N/A) INSERTION OF MESH (N/A)  Patient Location: PACU  Anesthesia Type:General  Level of Consciousness: oriented, sedated and patient cooperative  Airway & Oxygen Therapy: Patient Spontanous Breathing and Patient connected to nasal cannula oxygen  Post-op Assessment: Report given to PACU RN and Post -op Vital signs reviewed and stable  Post vital signs: Reviewed and stable  Complications: No apparent anesthesia complications

## 2012-05-16 NOTE — Interval H&P Note (Signed)
History and Physical Interval Note:  05/16/2012 1:19 PM  Judie Petit  has presented today for surgery, with the diagnosis of VENTRAL HERNIA  The various methods of treatment have been discussed with the patient and family. After consideration of risks, benefits and other options for treatment, the patient has consented to  Procedure(s): LAPAROSCOPIC VENTRAL HERNIA (N/A) INSERTION OF MESH (N/A) as a surgical intervention .  The patient's history has been reviewed, patient examined, no change in status, stable for surgery.  I have reviewed the patient's chart and labs.  Questions were answered to the patient's satisfaction.     Damoni Causby A.

## 2012-05-16 NOTE — Op Note (Signed)
[name], [name]               ACCOUNT NO.:  [id number]  MEDICAL RECORD NO.:  [id number]  LOCATION:  6N28C                        FACILITY:  [facility]  PHYSICIAN:  Maisie Fus A. Angelino Rumery, M.D.DATE OF BIRTH:  Jul 06, 1970  DATE OF PROCEDURE: DATE OF DISCHARGE:                              OPERATIVE REPORT   PREOPERATIVE DIAGNOSIS:  Incarcerated incisional hernia measuring 2 x 3 cm.  POSTOPERATIVE DIAGNOSIS:  Incarcerated incisional hernia measuring 3 x 3 cm with omentum.  PROCEDURE:  Laparoscopic repair of incarcerated incisional hernia using a 10 cm x 12 cm composite Parietex mesh.  SURGEON:  Maisie Fus A. Jael Kostick, M.D.  ANESTHESIA:  General endotracheal anesthesia with 0.25% Sensorcaine local.  ESTIMATED BLOOD LOSS:  Minimal.  SPECIMENS:  None.  IV FLUIDS:  2 L of crystalloid.  SPECIMENS:  None.  DRAINS:  None.  INDICATIONS FOR PROCEDURE:  The patient is a 42 year old female who had a recent robotic hysterectomy.  She has developed a hernia near the periumbilical incision from this procedure.  This was felt to be consistent with a possible incisional hernia.  It was incarcerated with fat.  She was having pain.  We discussed options of laparoscopic versus open repair.  I felt a laparoscopic approach would be best to give good visualization of the area in good underlap of this hernia.  I discussed the procedure in great length.  Risk of bleeding, infection, bowel injury, open procedure, injury to other neighboring structures, DVT, stroke, open abdomen, death.  After discussion of procedure and expectations as well as recovery time, she agreed to proceed.  DESCRIPTION OF PROCEDURE:  The patient was met in the holding area. Questions were answered.  She was taken back to the operating room where she was placed supine on the OR table.  Foley catheter was placed after the induction of smooth general anesthesia.  Abdomen was prepped and draped in sterile fashion.  Both arms were  tucked.  A left upper quadrant 5 mm incision was made after timeout and sterile prep and drape.  She received preoperative antibiotics.  Optiview was used and this was advanced through the abdominal wall without difficulty. Pneumoperitoneum was created to 15 mmHg, and a 5 mm scope was inserted. No evidence of bowel injury with insertion of this port site.  We then inserted 4 more ports, 1 in the right lower quadrant, second in the right upper quadrant and then we placed a left lower midline port.  The tip was identified and omentum was incarcerated.  I had mainly reduced with graspers.  The defect was 2 x 3 cm to look like.  Spinal needle was used to mark the edges.  A 3-cm overlap remarked the ailment.  A 10 x 15 piece of composite Parietex mesh was used.  I made an incision just to the left of the umbilicus over the hernia sac through the old scar from her previous robotic surgery.  Dissection was carried down to the hernia sac into the fascia opening into the abdominal cavity.  The mesh was oriented with 4 sutures of #1 Novafil and then rolled up and placed in the abdominal cavity with the smooth side down.  We then closed the  fascia with #1 Novafil pop-off.  We then re-insufflated the abdominal cavity.  The mesh was unfurled.  It is probably oriented.  Suture passer was used to pull all 4 sutures up superior, inferior in the left and right.  The hernia defect at least gave a 3-4 cm good overlap.  These were tied down.  A secure strap was used to circumferentially tack the mesh with no evidence of gaps.  Hemostasis was excellent.  The omentum was pulled underneath it.  Four-quadrant laparoscopy revealed no evidence of bowel injury or other intra-abdominal injury.  No signs of abdominal wall bleeding.  Of note, the harmonic scalp was used to take the falciform ligament to take it down to keep the mesh from being bowed superiorly.  At this point in time, I saw an area of the mesh that  was completely detached.  I then moved my left upper quadrant port to a left lower quadrant position by making a separate incision and advancing it under direct vision.  I then was able to use that to finish tacking the circumference of the mesh with no gaps.  It laid with no significant pleating.  At this point in time, we removed all our ports allowing the CO2 to escape.  Skin wounds were closed with a combination of 3-0 Vicryl and 4-0 Monocryl.  Dermabond used to cover the skin incisions.  All final counts were found to be correct of sponge, needle, and instruments.  Foley catheter was removed.  The patient was then placed in a binder, was extubated and taken recovery in satisfactory condition.     Millisa Giarrusso A. Amelio Brosky, M.D.     TAC/MEDQ  D:  05/16/2012  T:  05/16/2012  Job:  [id number]

## 2012-05-16 NOTE — Progress Notes (Signed)
Notified Dr. Corliss Skains that pt was experiencing some nausea but did not have nausea medication ordered. Received order for zofran.

## 2012-05-16 NOTE — H&P (View-Only) (Signed)
Patient ID: [name], female   DOB: [birth year], 42 y.o.   MRN: [id number]  Chief Complaint  Patient presents with  . New Evaluation    eval umb hernia    HPI [name] is a 42 y.o. female.  Patient sent at the request of Dr.Pyrtle for abdominal wall hernia. She's been worked up for abdominal pain, nausea, heartburn and dyspepsia. She developed a bulge along the left side of her abdominal wall skin present for one month. She to recent robotic hysterectomy and salpingo-oophorectomy. The bulge cause discomfort with lifting, pushing, or standing for long periods time. Occasional nausea. HPI  Past Medical History  Diagnosis Date  . H/O varicella     As an infant.  . Chronic back pain     H/O  . Fibroid     During first pregnancy  . GBS carrier   . Rh negative status during pregnancy   . Irregular uterine bleeding   . Cancer 2002    Left leg  . Monilial vaginitis 07/27/04  . First trimester bleeding 2008  . Constipation in pregnancy 03/2006  . AMA (advanced maternal age) multigravida 35+   . H/O amenorrhea 10/2006  . Acute mastitis of left breast 10/04/2007  . Hypertension     history of HTN with pregnancy  . Fibromyalgia   . Endometriosis     h/o  . SAB (spontaneous abortion)     h/o x 2    Past Surgical History  Procedure Laterality Date  . Wisdom tooth extraction  1989  . Appendectomy  2002  . Endocervical polyp removal  2001  . Melanoma removal   2003  . Uterine curettage  01/2009  . Endometrial ablation  2010  . Hysteroscopy w/ endometrial ablation  01/28/2009  . Tendon release  2004  . Salpingoophorectomy  12/08/2011    Procedure: SALPINGO OOPHERECTOMY;  Surgeon: Hal Morales, MD;  Location: [facility];  Service: Gynecology;  Laterality: Bilateral;  . Dilation and evacuation    . Abdominal hysterectomy  2013  . Melanoma excision  2002    Left Leg    Family History  Problem Relation Age of Onset  . Hypertension Mother   . Cancer Mother     breast    . Heart disease Father     atrial fib  . Hypertension Father   . Cancer Father     Lyphoma,Bladder  . Atrial fibrillation Father   . Cancer Maternal Grandmother     breast  . Heart disease Maternal Grandfather     MI  . Heart disease Paternal Grandfather     MI  . Cancer Maternal Aunt     ovarian  . Diverticulitis Brother     Social History History  Substance Use Topics  . Smoking status: Former Smoker    Types: Cigarettes    Quit date: 02/29/1988  . Smokeless tobacco: Never Used  . Alcohol Use: No    Allergies  Allergen Reactions  . Betadine (Povidone Iodine)     Dx with allergy testing  . Cymbalta (Duloxetine Hcl)     Makes her feel crazy, out of body  . Shellfish Allergy     Dx with allergy testing    Current Outpatient Prescriptions  Medication Sig Dispense Refill  . Beclomethasone Dipropionate (QNASL) 80 MCG/ACT AERS Place 2 Squirts into the nose daily. Each nostril      . estradiol (MINIVELLE) 0.05 MG/24HR Place 1 patch (0.05 mg total) onto the skin  2 (two) times a week.  8 patch  12  . Fexofenadine HCl (ALLEGRA PO) Take 1 tablet by mouth daily. otc      . ibuprofen (ADVIL,MOTRIN) 600 MG tablet Take 1 tablet (600 mg total) by mouth every 6 (six) hours as needed for pain.  30 tablet  0  . multivitamin (THERAGRAN) per tablet Take 1 tablet by mouth daily.        [name] omeprazole (PRILOSEC) 20 MG capsule Take 2 capsules (40 mg total) by mouth daily.  60 capsule  3   No current facility-administered medications for this visit.    Review of Systems Review of Systems  Constitutional: Negative for fever, chills and unexpected weight change.  HENT: Negative for hearing loss, congestion, sore throat, trouble swallowing and voice change.   Eyes: Negative for visual disturbance.  Respiratory: Negative for cough and wheezing.   Cardiovascular: Negative for chest pain, palpitations and leg swelling.  Gastrointestinal: Negative for nausea, vomiting, abdominal pain,  diarrhea, constipation, blood in stool, abdominal distention and anal bleeding.  Genitourinary: Negative for hematuria, vaginal bleeding and difficulty urinating.  Musculoskeletal: Negative for arthralgias.  Skin: Negative for rash and wound.  Neurological: Negative for seizures, syncope and headaches.  Hematological: Negative for adenopathy. Does not bruise/bleed easily.  Psychiatric/Behavioral: Negative for confusion.    Blood pressure 100/78, pulse 75, temperature 96.4 F (35.8 C), temperature source Temporal, resp. rate 18, height 5\' 4"  (1.626 m), weight 208 lb 12.8 oz (94.711 kg), last menstrual period 11/19/2010.  Physical Exam Physical Exam  Constitutional: She is oriented to person, place, and time. She appears well-developed and well-nourished.  HENT:  Head: Normocephalic and atraumatic.  Eyes: EOM are normal. Pupils are equal, round, and reactive to light.  Neck: Normal range of motion. Neck supple.  Cardiovascular: Normal rate and regular rhythm.   Pulmonary/Chest: Effort normal and breath sounds normal.  Abdominal: Soft. There is no tenderness. A hernia is present. Hernia confirmed positive in the ventral area.    Musculoskeletal: Normal range of motion.  Neurological: She is alert and oriented to person, place, and time.  Skin: Skin is warm and dry.  Psychiatric: She has a normal mood and affect. Her behavior is normal. Thought content normal.    Data Reviewed Clinical Data: Palpable, hard area to the left of the umbilicus.  CT ABDOMEN AND PELVIS WITH CONTRAST  Technique: Multidetector CT imaging of the abdomen and pelvis was  performed following the standard protocol during bolus  administration of intravenous contrast.  Contrast: OMNIPAQUE IOHEXOL 300 MG/ML SOLN  Comparison: None.  Findings: No focal abnormalities seen in the liver or spleen. The  stomach, duodenum, pancreas, gallbladder, and adrenal glands are  unremarkable. The kidneys have normal  imaging features  bilaterally.  No abdominal aortic aneurysm. There is no free fluid or  lymphadenopathy in the abdomen.  Just to the left of the umbilicus, a fascial defect is identified,  measuring approximately 2.5 x 2.4 cm. Omental fat projects through  this fascial defect creating a hernia sac measuring about 6.6 x 5.7  x 4.0 cm. No bowel within the hernia sac. No edema or fluid  within the hernia sac to suggest fatty incarceration.  Imaging through the pelvis shows no intraperitoneal free fluid.  There is no pelvic sidewall lymphadenopathy. The uterus is  surgically absent. There is no adnexal mass. The terminal ileum  is normal. Nonvisualization of the appendix is consistent with the  reported history of appendectomy.  Bone windows  reveal no worrisome lytic or sclerotic osseous  lesions.  IMPRESSION:  Left paraumbilical hernia as described. The hernia sac contains  only omental fat without evidence for fatty incarceration or  infarction.  Original Report Authenticated By: Kennith Center, M.D.   Assessment    Ventral hernia symptomatic    Plan    Discussed laparoscopic versus open ventral hernia repair. Discussed use of mesh. Risks, benefits and alternative therapies discussed. She like to proceed with laparoscopic ventral hernia. Mesh.The risk of hernia repair include bleeding,  Infection,   Recurrence of the hernia,  Mesh use, chronic pain,  Organ injury,  Bowel injury,  Bladder injury,   nerve injury with numbness around the incision,  Death,  and worsening of preexisting  medical problems.  The alternatives to surgery have been discussed as well..  Long term expectations of both operative and non operative treatments have been discussed.   The patient agrees to proceed.       Zeenat Jeanbaptiste A. 04/23/2012, 10:24 AM

## 2012-05-16 NOTE — Anesthesia Procedure Notes (Signed)
Procedure Name: Intubation Date/Time: 05/16/2012 1:38 PM Performed by: Romie Minus K Pre-anesthesia Checklist: Patient identified, Emergency Drugs available, Suction available, Patient being monitored and Timeout performed Patient Re-evaluated:Patient Re-evaluated prior to inductionOxygen Delivery Method: Circle system utilized Preoxygenation: Pre-oxygenation with 100% oxygen Intubation Type: IV induction Ventilation: Mask ventilation without difficulty Grade View: Grade I Tube type: Oral Tube size: 7.5 mm Number of attempts: 1 Airway Equipment and Method: Video-laryngoscopy and Stylet Placement Confirmation: positive ETCO2,  CO2 detector and breath sounds checked- equal and bilateral Secured at: 21 cm Tube secured with: Tape Dental Injury: Teeth and Oropharynx as per pre-operative assessment  Comments: Patient stated "Anesthesia used a camera to put the breathing tube in for my last surgery. I have a bulging disk in my neck." Plan for elective video-laryngoscopy.

## 2012-05-16 NOTE — Brief Op Note (Signed)
05/16/2012  3:26 PM  PATIENT:  [name]  42 y.o. female  PRE-OPERATIVE DIAGNOSIS:  Incisional hernia incarcerated 3 x 3 cm   POST-OPERATIVE DIAGNOSIS:  same  PROCEDURE:  Laparoscopic incisonal hernia repair with parietex composite mesh 10x 15 cm  SURGEON:  Surgeon(s) and Role:    * Naama Sappington A. Ediberto Sens, MD - Primary    ASSISTANTS: none   ANESTHESIA:   general  EBL:  Total I/O In: 1500 [I.V.:1500] Out: 100 [Urine:100]  BLOOD ADMINISTERED:none  DRAINS: none   LOCAL MEDICATIONS USED:  BUPIVICAINE   SPECIMEN:  No Specimen  DISPOSITION OF SPECIMEN:  N/A  COUNTS:  YES  TOURNIQUET:  * No tourniquets in log *  DICTATION: .Other Dictation: Dictation Number  [id number]  PLAN OF CARE: Admit for overnight observation  PATIENT DISPOSITION:  PACU - hemodynamically stable.   Delay start of Pharmacological VTE agent (>24hrs) due to surgical blood loss or risk of bleeding: no

## 2012-05-16 NOTE — Anesthesia Preprocedure Evaluation (Addendum)
Anesthesia Evaluation  Patient identified by MRN, date of birth, ID band Patient awake    Reviewed: Allergy & Precautions, H&P , NPO status , Patient's Chart, lab work & pertinent test results  History of Anesthesia Complications Negative for: history of anesthetic complications  Airway Mallampati: II TM Distance: >3 FB Neck ROM: Full    Dental  (+) Teeth Intact and Dental Advisory Given   Pulmonary neg pulmonary ROS,  breath sounds clear to auscultation  Pulmonary exam normal       Cardiovascular hypertension, Rhythm:Regular     Neuro/Psych  Headaches,    GI/Hepatic Neg liver ROS, GERD-  Medicated and Controlled,  Endo/Other  negative endocrine ROSobese  Renal/GU negative Renal ROS     Musculoskeletal  (+) Fibromyalgia -  Abdominal   Peds  Hematology   Anesthesia Other Findings   Reproductive/Obstetrics                          Anesthesia Physical Anesthesia Plan  ASA: II  Anesthesia Plan: General   Post-op Pain Management:    Induction: Intravenous  Airway Management Planned: Oral ETT  Additional Equipment:   Intra-op Plan:   Post-operative Plan: Extubation in OR  Informed Consent: I have reviewed the patients History and Physical, chart, labs and discussed the procedure including the risks, benefits and alternatives for the proposed anesthesia with the patient or authorized representative who has indicated his/her understanding and acceptance.     Plan Discussed with: CRNA and Surgeon  Anesthesia Plan Comments:         Anesthesia Quick Evaluation

## 2012-05-16 NOTE — Preoperative (Signed)
Beta Blockers   Reason not to administer Beta Blockers:Not Applicable 

## 2012-05-17 LAB — BASIC METABOLIC PANEL
CO2: 29 mEq/L (ref 19–32)
Glucose, Bld: 136 mg/dL — ABNORMAL HIGH (ref 70–99)
Potassium: 5 mEq/L (ref 3.5–5.1)
Sodium: 139 mEq/L (ref 135–145)

## 2012-05-17 LAB — CBC
HCT: 35.6 % — ABNORMAL LOW (ref 36.0–46.0)
Hemoglobin: 12.6 g/dL (ref 12.0–15.0)
MCH: 29 pg (ref 26.0–34.0)
RBC: 4.35 MIL/uL (ref 3.87–5.11)

## 2012-05-17 MED ORDER — PANTOPRAZOLE SODIUM 40 MG PO TBEC
40.0000 mg | DELAYED_RELEASE_TABLET | Freq: Every day | ORAL | Status: DC
  Administered 2012-05-17 – 2012-05-18 (×2): 40 mg via ORAL
  Filled 2012-05-17 (×2): qty 1

## 2012-05-17 MED ORDER — TRAMADOL HCL 50 MG PO TABS: 50.0000 mg | ORAL_TABLET | Freq: Four times a day (QID) | ORAL | Status: DC | PRN

## 2012-05-17 MED ORDER — OXYCODONE-ACETAMINOPHEN 10-325 MG PO TABS: 1.0000 | ORAL_TABLET | ORAL | Status: DC | PRN

## 2012-05-17 MED ORDER — POLYETHYLENE GLYCOL 3350 17 GM/SCOOP PO POWD: 17.0000 g | Freq: Every day | ORAL | Status: DC

## 2012-05-17 NOTE — Progress Notes (Signed)
1 Day Post-Op  Subjective: Some nausea and pain  Objective: Vital signs in last 24 hours: Temp:  [97.6 F (36.4 C)-98.6 F (37 C)] 98.1 F (36.7 C) (03/20 0554) Pulse Rate:  [61-100] 96 (03/20 0554) Resp:  [12-20] 16 (03/20 0554) BP: (114-144)/(54-91) 124/77 mmHg (03/20 0554) SpO2:  [92 %-100 %] 100 % (03/20 0554) Weight:  [212 lb 8.4 oz (96.4 kg)] 212 lb 8.4 oz (96.4 kg) (03/19 1737) Last BM Date: 05/15/12  Intake/Output from previous day: 03/19 0701 - 03/20 0700 In: 3321.7 [I.V.:3121.7; IV Piggyback:200] Out: 100 [Urine:100] Intake/Output this shift:    Cardio: regular rate and rhythm, S1, S2 normal, no murmur, click, rub or gallop Incision/Wound:clean dry intact.  soft  Lab Results:   Recent Labs  05/16/12 1709 05/17/12 0640  WBC 11.1* 9.2  HGB 13.4 12.6  HCT 38.5 35.6*  PLT 258 264   BMET  Recent Labs  05/16/12 1709 05/17/12 0640  NA  --  139  K  --  5.0  CL  --  104  CO2  --  29  GLUCOSE  --  136*  BUN  --  8  CREATININE 0.53 0.55  CALCIUM  --  9.0   PT/INR No results found for this basename: LABPROT, INR,  in the last 72 hours ABG No results found for this basename: PHART, PCO2, PO2, HCO3,  in the last 72 hours  Studies/Results: No results found.  Anti-infectives: Anti-infectives   Start     Dose/Rate Route Frequency Ordered Stop   05/16/12 0600  ceFAZolin (ANCEF) 3 g in dextrose 5 % 50 mL IVPB     3 g 160 mL/hr over 30 Minutes Intravenous On call to O.R. 05/15/12 1452 05/16/12 1330      Assessment/Plan: s/p Procedure(s): LAPAROSCOPIC VENTRAL HERNIA (N/A) INSERTION OF MESH (N/A) Advance diet Wean oxygen Home later today or friday  LOS: 1 day    Chelsae Zanella A. 05/17/2012

## 2012-05-17 NOTE — Progress Notes (Signed)
Patient has discharge orders in but still requiring IV meds and doesn't feel quite ready for discharge today.

## 2012-05-17 NOTE — Discharge Summary (Signed)
Physician Discharge Summary  Patient ID: [name] MRN: [id number] DOB/AGE: Feb 02, 1971 42 y.o.  Admit date: 05/16/2012 Discharge date: 05/17/2012  Admission Diagnoses: incisional hernia incarcerated.  Discharge Diagnoses: same Active Problems:   * No active hospital problems. *   Discharged Condition: good  Hospital Course: unremarkable.  Consults: None  Significant Diagnostic Studies: nonoe  Treatments: surgery: laparoscopic incisional hernia repair with mesh  Discharge Exam: Blood pressure 125/69, pulse 97, temperature 98.2 F (36.8 C), temperature source Oral, resp. rate 20, height 5\' 4"  (1.626 m), weight 212 lb 8.4 oz (96.4 kg), last menstrual period 11/19/2010, SpO2 96.00%. General appearance: alert and cooperative GI: soft non distended.  incisions clean dry intact.  Disposition: 01-Home or Self Care  Discharge Orders   Future Appointments Provider Department Dept Phone   06/04/2012 9:00 AM Maisie Fus A. Phuoc Huy, MD [facility], [place] [phone]   Future Orders Complete By Expires     Diet - low sodium heart healthy  As directed     Increase activity slowly  As directed         Medication List    STOP taking these medications       acetaminophen 500 MG tablet  Commonly known as:  TYLENOL      TAKE these medications       estradiol 0.05 MG/24HR  Commonly known as:  VIVELLE-DOT  Place 1 patch onto the skin 2 (two) times a week. Monday and Thursday     fexofenadine 180 MG tablet  Commonly known as:  ALLEGRA  Take 180 mg by mouth at bedtime.     ibuprofen 200 MG tablet  Commonly known as:  ADVIL,MOTRIN  Take 400 mg by mouth every 8 (eight) hours as needed for headache.     multivitamin per tablet  Take 1 tablet by mouth daily.     omeprazole 20 MG capsule  Commonly known as:  PRILOSEC  Take 40 mg by mouth daily.     oxyCODONE-acetaminophen 10-325 MG per tablet  Commonly known as:  PERCOCET  Take 1 tablet by mouth every 4 (four)  hours as needed for pain.     polyethylene glycol powder powder  Commonly known as:  GLYCOLAX  Take 17 g by mouth daily.     QNASL 80 MCG/ACT Aers  Generic drug:  Beclomethasone Dipropionate  Place 2 Squirts into the nose daily as needed (allergies). Each nostril     traMADol 50 MG tablet  Commonly known as:  ULTRAM  Take 1 tablet (50 mg total) by mouth every 6 (six) hours as needed for pain.           Follow-up Information   Follow up with Dominque Marlin A., MD In 3 weeks.   Contact information:   [address] [place] [address] [phone]       Signed: Dortha Schwalbe. 05/17/2012, 11:42 AM

## 2012-05-17 NOTE — Anesthesia Postprocedure Evaluation (Signed)
  Anesthesia Post-op Note  Patient: [name]  Procedure(s) Performed: Procedure(s): LAPAROSCOPIC VENTRAL HERNIA (N/A) INSERTION OF MESH (N/A)  Patient Location: PACU  Anesthesia Type:General  Level of Consciousness: awake  Airway and Oxygen Therapy: Patient Spontanous Breathing  Post-op Pain: mild  Post-op Assessment: Post-op Vital signs reviewed  Post-op Vital Signs: stable  Complications: No apparent anesthesia complications

## 2012-05-18 ENCOUNTER — Telehealth (INDEPENDENT_AMBULATORY_CARE_PROVIDER_SITE_OTHER): Payer: Self-pay | Admitting: *Deleted

## 2012-05-18 MED ORDER — OXYCODONE-ACETAMINOPHEN 5-325 MG PO TABS: 2.0000 | ORAL_TABLET | ORAL | Status: DC | PRN

## 2012-05-18 NOTE — Progress Notes (Signed)
2 Days Post-Op  Subjective: Feels better.  Did not go  home as planned but ready today.  Objective: Vital signs in last 24 hours: Temp:  [98 F (36.7 C)-98.6 F (37 C)] 98.6 F (37 C) (03/21 0535) Pulse Rate:  [82-97] 82 (03/21 0535) Resp:  [18-20] 18 (03/21 0535) BP: (117-136)/(69-93) 136/93 mmHg (03/21 0535) SpO2:  [92 %-96 %] 92 % (03/21 0535) Last BM Date: 05/15/12  Intake/Output from previous day: 03/20 0701 - 03/21 0700 In: 770 [I.V.:770] Out: -  Intake/Output this shift:    General appearance: alert and appears stated age ABDOMEN SORE.  SOME SWELLING. Lab Results:   Recent Labs  05/16/12 1709 05/17/12 0640  WBC 11.1* 9.2  HGB 13.4 12.6  HCT 38.5 35.6*  PLT 258 264   BMET  Recent Labs  05/16/12 1709 05/17/12 0640  NA  --  139  K  --  5.0  CL  --  104  CO2  --  29  GLUCOSE  --  136*  BUN  --  8  CREATININE 0.53 0.55  CALCIUM  --  9.0   PT/INR No results found for this basename: LABPROT, INR,  in the last 72 hours ABG No results found for this basename: PHART, PCO2, PO2, HCO3,  in the last 72 hours  Studies/Results: No results found.  Anti-infectives: Anti-infectives   Start     Dose/Rate Route Frequency Ordered Stop   05/16/12 0600  ceFAZolin (ANCEF) 3 g in dextrose 5 % 50 mL IVPB     3 g 160 mL/hr over 30 Minutes Intravenous On call to O.R. 05/15/12 1452 05/16/12 1330      Assessment/Plan: s/p Procedure(s): LAPAROSCOPIC VENTRAL HERNIA (N/A) INSERTION OF MESH (N/A) Discharge  LOS: 2 days    Lorimer Tiberio A. 05/18/2012

## 2012-05-18 NOTE — Telephone Encounter (Signed)
Patients husband called to request nausea medication for the patient. Cornett MD paged at this time, awaiting response.

## 2012-05-18 NOTE — Discharge Summary (Signed)
  Pt spent the night for pain control.  Feels better.  Home today.

## 2012-05-18 NOTE — Telephone Encounter (Signed)
Cornett MD approved to call in Phenergan 12.5mg  1tab every 6 hours as needed for nausea #20 no refills.  Called into [facility] [phone].  Patient's husband updated.

## 2012-05-22 ENCOUNTER — Encounter (HOSPITAL_COMMUNITY): Payer: Self-pay | Admitting: Surgery

## 2012-05-23 ENCOUNTER — Telehealth (INDEPENDENT_AMBULATORY_CARE_PROVIDER_SITE_OTHER): Payer: Self-pay

## 2012-05-23 NOTE — Telephone Encounter (Signed)
Wenda Low talked to her last night.  She said she called yesterday but no one called her back or made me aware.  What you told her is what I would tell her.  The mesh is held by sutures and tacks and there is nothing to do differently at this point even if one of the sutures breaks.  The suture  Sites will hurt for months sometimes.  She will have more pain the more active she gets and that is to be expected.  Agree with moving up her appointment.

## 2012-05-23 NOTE — Telephone Encounter (Signed)
Patient called in stating she took her binder off yesterday and she felt a pop on the right side of abdomin with stabbing pain to follow. She states today she feels better and is able to move around but still has discomfort and is concerned something is wrong. I told her that she could expect pain and discomfort for weeks to come especially since she is becoming more active and moving around. I told her the sutures that are holding the mess is what causes a lot of discomfort for patients and I believe that is where her pain is coming from. She states incisions look good with no signs of redness or drainage. I encouraged her to keep the binder on. Since she is overly worried I moved her follow up appointment to Monday so she can be examined. She asked I send this message to Dr Luisa Hart to see if he agrees with this plan.  I told her I will call if he suggests anything more or if she does not hear from me we will see her next week.

## 2012-05-28 ENCOUNTER — Telehealth: Payer: Self-pay | Admitting: Internal Medicine

## 2012-05-28 ENCOUNTER — Ambulatory Visit (INDEPENDENT_AMBULATORY_CARE_PROVIDER_SITE_OTHER): Payer: BC Managed Care – PPO | Admitting: Surgery

## 2012-05-28 ENCOUNTER — Encounter (INDEPENDENT_AMBULATORY_CARE_PROVIDER_SITE_OTHER): Payer: Self-pay | Admitting: Surgery

## 2012-05-28 VITALS — BP 130/90 | HR 72 | Resp 18 | Ht 64.0 in | Wt 208.0 lb

## 2012-05-28 DIAGNOSIS — Z9889 Other specified postprocedural states: Secondary | ICD-10-CM

## 2012-05-28 MED ORDER — RABEPRAZOLE SODIUM 20 MG PO TBEC: DELAYED_RELEASE_TABLET | ORAL | Status: DC

## 2012-05-28 NOTE — Telephone Encounter (Signed)
Discontinue omeprazole, trial of Dexilant 60 mg daily. This should be taken 30 minutes to one hour before breakfast. Have her update [place] after 2-4 weeks on if this helps

## 2012-05-28 NOTE — Patient Instructions (Addendum)
Resume lifting above 20 lbs in two weeks and increase activity slowly.  Return as needed.

## 2012-05-28 NOTE — Telephone Encounter (Signed)
Dexilant was not on Formulary; OK with Dr Rhea Belton to order generic Aciphex. Informed pt of change in med; she stated understanding.

## 2012-05-28 NOTE — Telephone Encounter (Signed)
Pt had her 1st post op visit today for Hernia repair. She is taking Omeprazole 40mg  qam, but is still experiencing periods of heartburn. She experienced it with drinking a soda, eating greek yogurt and another time with Chex Mix. Any advice; change PPI? Thanks.

## 2012-05-28 NOTE — Progress Notes (Signed)
Patient returns after laparoscopic incisional hernia repair. She is doing well. She has a popping sensation a week ago this is now improved.  Exam: Incisions clean dry and intact. No signs of seroma or infection. No hernia recurrence.  Impression: Status post laparoscopic incisional hernia repair  Plan: Doing well. Level of activity over the next couple weeks given to the patient. Return to clinic as needed.

## 2012-06-04 ENCOUNTER — Encounter (INDEPENDENT_AMBULATORY_CARE_PROVIDER_SITE_OTHER): Payer: BC Managed Care – PPO | Admitting: Surgery

## 2012-07-02 ENCOUNTER — Telehealth: Payer: Self-pay | Admitting: Internal Medicine

## 2012-07-02 NOTE — Telephone Encounter (Signed)
Patient wants to try and stop PPI.  She is not sure if her symptoms have resolved due to meds or other changes she has made.  She will try holding her PPI and if her symptoms return she will resume

## 2012-07-02 NOTE — Telephone Encounter (Signed)
Left message for patient to call back  

## 2012-07-06 ENCOUNTER — Encounter (INDEPENDENT_AMBULATORY_CARE_PROVIDER_SITE_OTHER): Payer: BC Managed Care – PPO | Admitting: Surgery

## 2012-08-06 ENCOUNTER — Encounter: Payer: Self-pay | Admitting: Internal Medicine

## 2012-08-07 ENCOUNTER — Encounter: Payer: Self-pay | Admitting: Internal Medicine

## 2012-08-07 ENCOUNTER — Ambulatory Visit (INDEPENDENT_AMBULATORY_CARE_PROVIDER_SITE_OTHER): Payer: BC Managed Care – PPO | Admitting: Internal Medicine

## 2012-08-07 VITALS — BP 112/80 | HR 64 | Ht 64.0 in | Wt 211.4 lb

## 2012-08-07 DIAGNOSIS — K219 Gastro-esophageal reflux disease without esophagitis: Secondary | ICD-10-CM

## 2012-08-07 DIAGNOSIS — E669 Obesity, unspecified: Secondary | ICD-10-CM

## 2012-08-07 DIAGNOSIS — K439 Ventral hernia without obstruction or gangrene: Secondary | ICD-10-CM

## 2012-08-07 MED ORDER — OMEPRAZOLE 20 MG PO CPDR: 40.0000 mg | DELAYED_RELEASE_CAPSULE | Freq: Every day | ORAL | Status: DC

## 2012-08-07 NOTE — Progress Notes (Signed)
Subjective:    Patient ID: [name], female    DOB: 11/20/1970, 42 y.o.   MRN: [id number]  HPI [name] is a 42 yo female with PMH of chronic back pain, hypertension, dysfunction uterine bleeding status post hysterectomy and bilateral salpingo-oophorectomy, GERD who is seen in followup. After her last visit a CT scan was performed which confirmed an incisional hernia and she had this repaired by Dr. Luisa Hart in March. She reports she did have the expected pain after the surgery but is very glad she had this done. Her lower abdominal, intermittent pain has resolved completely. She is very happy with the result. She has continued to need PPI therapy to control her reflux. At some point in the last several months omeprazole was increased from 20 mg to 40 mg daily, and then for approximately one month switch to AcipHex daily. AcipHex helped her greatly and his medication ran out she returned omeprazole 20 mg daily. She is doing very well from a reflux standpoint on omeprazole 20 mg daily. She denies heartburn, nausea, vomiting or epigastric pain. She does occasionally have bloating which may be worse with dairy products. She wonders how long she will need reflux control and medication. She has gained weight over the last several years and thinks this may be contributing to her reflux. No habits are normal without blood or melena.  Review of Systems As per history of present illness, otherwise negative     Current Medications, Allergies, Past Medical History, Past Surgical History, Family History and Social History were reviewed in [employer] record.  Objective:   Physical Exam BP 112/80  Pulse 64  Ht 5\' 4"  (1.626 m)  Wt 211 lb 6.4 oz (95.89 kg)  BMI 36.27 kg/m2  LMP 11/19/2010 Constitutional: Well-developed and well-nourished. No distress. HEENT: Normocephalic and atraumatic.  No scleral icterus. Cardiovascular: Normal rate, regular rhythm and intact distal  pulses.  Pulmonary/chest: Effort normal and breath sounds normal. No wheezing, rales or rhonchi. Abdominal: Soft, nontender, nondistended. Bowel sounds active throughout. There are no masses palpable. Well-healed laparoscopic scars Extremities: no clubbing, cyanosis, or edema Neurological: Alert and oriented to person place and time. Skin: Skin is warm and dry. No rashes noted. Psychiatric: Normal mood and affect. Behavior is normal.    Assessment & Plan:  42 yo female with PMH of chronic back pain, hypertension, dysfunction uterine bleeding status post hysterectomy and bilateral salpingo-oophorectomy, GERD who is seen in followup.  1.  GERD -- her GERD symptoms are completely controlled on omeprazole 20 mg daily. There are no other alarm symptoms and for these reasons I do not feel she needs upper endoscopy at this time. When determining the long-term need for acid control and medication, we discussed weight loss as one modifiable factor which may significantly improve her reflux. She reports she would like to lose about 60 pounds, which may be too high of a goal for right now. I recommended attempting to lose 15% of her body weight which would be closer to 30 pounds. She is able to do this we can try to wean off her omeprazole. For now she will continue omeprazole 20 mg daily if she has breakthrough symptoms on very rare occasion (1-2 times a month) she can use over-the-counter ranitidine or famotidine. If her symptoms become more frequent or change in any way, have asked that she call me, and we would likely proceed to endoscopy. She voices understanding. We reviewed trigger foods such as chocolate, peppermint, alcohol,  caffeine. I will see her back in 6 months, sooner if necessary  2.  Incisional hernia -- status post repair with very good result.

## 2012-08-07 NOTE — Patient Instructions (Addendum)
Continue taking omeprazole  Follow up with Dr. Rhea Belton in office in 6 months. Work on a 15% weight reduction over these 6 months  If heart burn gets worse Please call our office.  [employer] Fun [employer]!                                                   We are excited to introduce MyChart, a new best-in-class service that provides you online access to important information in your electronic medical record. We want to make it easier for you to view your health information - all in one secure location - when and where you need it. We expect MyChart will enhance the quality of care and service we provide.  When you register for MyChart, you can:    View your test results.    Request appointments and receive appointment reminders via email.    Request medication renewals.    View your medical history, allergies, medications and immunizations.    Communicate with your physician's office through a password-protected site.    Conveniently print information such as your medication lists.  To find out if MyChart is right for you, please talk to a member of our clinical staff today. We will gladly answer your questions about this free health and wellness tool.  If you are age 44 or older and want a member of your family to have access to your record, you must provide written consent by completing a proxy form available at our office. Please speak to our clinical staff about guidelines regarding accounts for patients younger than age 25.  As you activate your MyChart account and need any technical assistance, please call the MyChart technical support line at [phone]-CHART [phone]) or email your question to [email or web address]. If you email your question(s), please include your name, a return phone number and the best time to reach you.  If you have non-urgent health-related questions, you can send a message to our office through MyChart at [email or web address]. If you have a  medical emergency, call 911.  Thank you for using MyChart as your new health and wellness resource!   MyChart licensed from [employer],  4540-9811. Patents Pending.

## 2012-12-03 ENCOUNTER — Encounter (HOSPITAL_BASED_OUTPATIENT_CLINIC_OR_DEPARTMENT_OTHER): Payer: Self-pay | Admitting: *Deleted

## 2012-12-03 NOTE — Progress Notes (Signed)
Pt has had multiple surgeries-no labs needed

## 2012-12-04 ENCOUNTER — Other Ambulatory Visit: Payer: Self-pay | Admitting: Orthopedic Surgery

## 2012-12-04 NOTE — H&P (Signed)
[name] is an 42 y.o. female.    Chief Complaint: Left Knee Pain  HPI: Patient returns today reporting that the swelling and pain in her left knee is much better than it was before.  She is still tender along the medial side where she has a presumed possible medial meniscal tear from last year.  On her initial visit we aspirated 60 cc of blood-tinged fluid of the cell count 28,000 10/25/2012, cultures were negative.  On return visit, 10/30/2012 we aspirated 40 cc of yellow TENS fluid that was non-cloudy and also came back culture negative.  We also sent her off for peripheral blood work to include CBC, CRP, sedimentation rate, rheumatoid factor and an anti-Smith antibiotics as well as other rheumatoid test.  Does come back, or negative, except for a mildly elevated CRP of 1.4.  In addition, the patient's knee has not reaccumulated fluid but she does have tenderness along the medial the posterior medial joint line. MRI scan is been accomplished, that shows a loose body in the medial gutter that is about a centimeter by 5 mm x 6 mm as well as a probable loose body in the lateral compartment between the femur and the tibia only 1-2 mm in size.  There is a minimal effusion.   Past Medical History  Diagnosis Date  . H/O varicella     As an infant.  . Chronic back pain     H/O  . Fibroid     During first pregnancy  . GBS carrier   . Rh negative status during pregnancy   . Irregular uterine bleeding   . Cancer 2002    Left leg  . Monilial vaginitis 07/27/04  . First trimester bleeding 2008  . Constipation in pregnancy 03/2006  . AMA (advanced maternal age) multigravida 35+   . H/O amenorrhea 10/2006  . Acute mastitis of left breast 10/04/2007  . Fibromyalgia   . Endometriosis     h/o  . SAB (spontaneous abortion)     h/o x 2  . Complication of anesthesia     Difficult time waking up  . Heart murmur   . GERD (gastroesophageal reflux disease)   . Headache(784.0)   . Environmental allergies    . Seasonal allergies   . TMJ syndrome     Past Surgical History  Procedure Laterality Date  . Wisdom tooth extraction  1989  . Appendectomy  2002  . Endocervical polyp removal  2001  . Melanoma removal   2003  . Uterine curettage  01/2009  . Endometrial ablation  2010  . Hysteroscopy w/ endometrial ablation  01/28/2009  . Tendon release  2004  . Salpingoophorectomy  12/08/2011    Procedure: SALPINGO OOPHERECTOMY;  Surgeon: Natalie Morales, MD;  Location: [facility];  Service: Gynecology;  Laterality: Bilateral;  . Dilation and evacuation    . Abdominal hysterectomy  2013  . Melanoma excision  2002    Left Leg  . Hernia repair  05/16/12    incisional hernia repair  . Ventral hernia repair N/A 05/16/2012    Procedure: LAPAROSCOPIC VENTRAL HERNIA;  Surgeon: Natalie Pu. Cornett, MD;  Location: [facility];  Service: General;  Laterality: N/A;  . Insertion of mesh N/A 05/16/2012    Procedure: INSERTION OF MESH;  Surgeon: Natalie Pu. Cornett, MD;  Location: [facility];  Service: General;  Laterality: N/A;    Family History  Problem Relation Age of Onset  . Hypertension Mother   . Cancer Mother  breast  . Heart disease Father     atrial fib  . Hypertension Father   . Cancer Father     Lyphoma,Bladder  . Atrial fibrillation Father   . Cancer Maternal Grandmother     breast  . Heart disease Maternal Grandfather     MI  . Heart disease Paternal Grandfather     MI  . Cancer Maternal Aunt     ovarian  . Diverticulitis Brother    Social History:  reports that she quit smoking about 24 years ago. Her smoking use included Cigarettes. She smoked 0.00 packs per day. She has never used smokeless tobacco. She reports that she does not drink alcohol or use illicit drugs.  Allergies:  Allergies  Allergen Reactions  . Betadine [Povidone Iodine]     Dx with allergy testing  . Cymbalta [Duloxetine Hcl]     Makes her feel crazy, out of body  . Shellfish Allergy     Dx with allergy testing     No prescriptions prior to admission    No results found for this or any previous visit (from the past 48 hour(s)). No results found.  Review of Systems  Constitutional: Negative.   HENT: Negative.   Eyes: Negative.   Respiratory: Negative.   Cardiovascular: Negative.   Gastrointestinal: Negative.   Genitourinary: Negative.   Musculoskeletal: Positive for myalgias and joint pain.  Skin: Negative.   Neurological: Negative.   Endo/Heme/Allergies: Negative.   Psychiatric/Behavioral: Negative.     Last menstrual period 11/19/2010. Physical Exam  Constitutional: She is oriented to person, place, and time. She appears well-developed and well-nourished.  HENT:  Head: Normocephalic and atraumatic.  Eyes: Pupils are equal, round, and reactive to light.  Neck: Normal range of motion. Neck supple.  Cardiovascular: Intact distal pulses.   Respiratory: Effort normal.  Musculoskeletal: She exhibits edema and tenderness.  Neurological: She is alert and oriented to person, place, and time.  Skin: Skin is warm and dry.  Psychiatric: She has a normal mood and affect. Her behavior is normal. Judgment and thought content normal.     Assessment/Plan Assess: Symptomatic loose body in the left knee that is been probably present for a year no obvious donor site on the MRI scan  Plan: Because the patient has had episodes of locking and does not trust her knee and has not improved over time.  She is a candidate for arthroscopic removal of loose body.  The risks and benefits of surgery discussed at length with the patient we'll try to get this scheduled for her as an outpatient at her convenience.  In the meantime no running, no jumping, no high impact activities.  [name] R 12/04/2012, 8:35 AM

## 2012-12-05 ENCOUNTER — Ambulatory Visit (HOSPITAL_BASED_OUTPATIENT_CLINIC_OR_DEPARTMENT_OTHER): Payer: BC Managed Care – PPO | Admitting: Anesthesiology

## 2012-12-05 ENCOUNTER — Encounter (HOSPITAL_BASED_OUTPATIENT_CLINIC_OR_DEPARTMENT_OTHER): Payer: Self-pay | Admitting: *Deleted

## 2012-12-05 ENCOUNTER — Encounter (HOSPITAL_BASED_OUTPATIENT_CLINIC_OR_DEPARTMENT_OTHER): Payer: BC Managed Care – PPO | Admitting: Anesthesiology

## 2012-12-05 ENCOUNTER — Encounter (HOSPITAL_BASED_OUTPATIENT_CLINIC_OR_DEPARTMENT_OTHER)
Admission: RE | Disposition: A | Discharge status: Home / Self Care | Payer: Self-pay | Source: Ambulatory Visit | Attending: Orthopedic Surgery

## 2012-12-05 ENCOUNTER — Ambulatory Visit (HOSPITAL_BASED_OUTPATIENT_CLINIC_OR_DEPARTMENT_OTHER)
Admission: RE | Admit: 2012-12-05 | Discharge: 2012-12-05 | Disposition: A | Discharge status: Home / Self Care | Payer: BC Managed Care – PPO | Source: Ambulatory Visit | Attending: Orthopedic Surgery | Admitting: Orthopedic Surgery

## 2012-12-05 DIAGNOSIS — K219 Gastro-esophageal reflux disease without esophagitis: Secondary | ICD-10-CM | POA: Insufficient documentation

## 2012-12-05 DIAGNOSIS — M2240 Chondromalacia patellae, unspecified knee: Secondary | ICD-10-CM | POA: Insufficient documentation

## 2012-12-05 DIAGNOSIS — M12269 Villonodular synovitis (pigmented), unspecified knee: Secondary | ICD-10-CM | POA: Insufficient documentation

## 2012-12-05 DIAGNOSIS — M2340 Loose body in knee, unspecified knee: Secondary | ICD-10-CM | POA: Insufficient documentation

## 2012-12-05 DIAGNOSIS — M2342 Loose body in knee, left knee: Secondary | ICD-10-CM

## 2012-12-05 HISTORY — PX: KNEE ARTHROSCOPY: SHX127

## 2012-12-05 LAB — SYNOVIAL CELL COUNT + DIFF, W/ CRYSTALS
Crystals, Fluid: NONE SEEN
Eosinophils-Synovial: 0 % (ref 0–1)
Lymphocytes-Synovial Fld: 3 % (ref 0–20)
Monocyte-Macrophage-Synovial Fluid: 21 % — ABNORMAL LOW (ref 50–90)
Neutrophil, Synovial: 76 % — ABNORMAL HIGH (ref 0–25)
WBC, Synovial: 30195 /mm3 — ABNORMAL HIGH (ref 0–200)

## 2012-12-05 LAB — POCT HEMOGLOBIN-HEMACUE: Hemoglobin: 13.1 g/dL (ref 12.0–15.0)

## 2012-12-05 SURGERY — ARTHROSCOPY, KNEE
Anesthesia: General | Site: Knee | Laterality: Left | Wound class: Clean

## 2012-12-05 MED ORDER — DEXAMETHASONE SODIUM PHOSPHATE 4 MG/ML IJ SOLN
INTRAMUSCULAR | Status: DC | PRN
  Administered 2012-12-05: 10 mg via INTRAVENOUS

## 2012-12-05 MED ORDER — SODIUM CHLORIDE 0.9 % IR SOLN
Status: DC | PRN
  Administered 2012-12-05: 1

## 2012-12-05 MED ORDER — MIDAZOLAM HCL 5 MG/5ML IJ SOLN
INTRAMUSCULAR | Status: DC | PRN
  Administered 2012-12-05: 2 mg via INTRAVENOUS

## 2012-12-05 MED ORDER — LIDOCAINE HCL (CARDIAC) 20 MG/ML IV SOLN
INTRAVENOUS | Status: DC | PRN
  Administered 2012-12-05: 50 mg via INTRAVENOUS

## 2012-12-05 MED ORDER — PROPOFOL 10 MG/ML IV BOLUS
INTRAVENOUS | Status: DC | PRN
  Administered 2012-12-05: 300 mg via INTRAVENOUS

## 2012-12-05 MED ORDER — BUPIVACAINE-EPINEPHRINE 0.5% -1:200000 IJ SOLN
INTRAMUSCULAR | Status: DC | PRN
  Administered 2012-12-05: 8 mL

## 2012-12-05 MED ORDER — EPINEPHRINE HCL 1 MG/ML IJ SOLN
INTRAMUSCULAR | Status: DC | PRN
  Administered 2012-12-05: 1 mg

## 2012-12-05 MED ORDER — CEFAZOLIN SODIUM-DEXTROSE 2-3 GM-% IV SOLR
2.0000 g | INTRAVENOUS | Status: AC
  Administered 2012-12-05: 2 g via INTRAVENOUS

## 2012-12-05 MED ORDER — ONDANSETRON HCL 4 MG/2ML IJ SOLN
INTRAMUSCULAR | Status: DC | PRN
  Administered 2012-12-05: 4 mg via INTRAMUSCULAR

## 2012-12-05 MED ORDER — OXYCODONE HCL 5 MG PO TABS
5.0000 mg | ORAL_TABLET | Freq: Once | ORAL | Status: AC | PRN
  Administered 2012-12-05: 5 mg via ORAL

## 2012-12-05 MED ORDER — SCOPOLAMINE 1 MG/3DAYS TD PT72
1.0000 | MEDICATED_PATCH | TRANSDERMAL | Status: DC
  Administered 2012-12-05: 1.5 mg via TRANSDERMAL

## 2012-12-05 MED ORDER — OXYCODONE HCL 5 MG/5ML PO SOLN: 5.0000 mg | Freq: Once | ORAL | Status: AC | PRN

## 2012-12-05 MED ORDER — CHLORHEXIDINE GLUCONATE 4 % EX LIQD: 60.0000 mL | Freq: Once | CUTANEOUS | Status: DC

## 2012-12-05 MED ORDER — FENTANYL CITRATE 0.05 MG/ML IJ SOLN
INTRAMUSCULAR | Status: DC | PRN
  Administered 2012-12-05: 100 ug via INTRAVENOUS

## 2012-12-05 MED ORDER — ONDANSETRON HCL 4 MG/2ML IJ SOLN: 4.0000 mg | Freq: Once | INTRAMUSCULAR | Status: DC | PRN

## 2012-12-05 MED ORDER — OXYCODONE-ACETAMINOPHEN 5-325 MG PO TABS: 1.0000 | ORAL_TABLET | ORAL | Status: DC | PRN

## 2012-12-05 MED ORDER — LACTATED RINGERS IV SOLN
INTRAVENOUS | Status: DC
  Administered 2012-12-05 (×2): via INTRAVENOUS

## 2012-12-05 MED ORDER — HYDROMORPHONE HCL PF 1 MG/ML IJ SOLN
0.2500 mg | INTRAMUSCULAR | Status: DC | PRN
  Administered 2012-12-05 (×3): 0.5 mg via INTRAVENOUS

## 2012-12-05 MED ORDER — DEXTROSE-NACL 5-0.45 % IV SOLN: INTRAVENOUS | Status: DC

## 2012-12-05 SURGICAL SUPPLY — 40 items
BANDAGE ELASTIC 6 VELCRO ST LF (GAUZE/BANDAGES/DRESSINGS) ×2 IMPLANT
BLADE 4.2CUDA (BLADE) IMPLANT
BLADE CUTTER GATOR 3.5 (BLADE) ×2 IMPLANT
BLADE GREAT WHITE 4.2 (BLADE) ×2 IMPLANT
CANISTER OMNI JUG 16 LITER (MISCELLANEOUS) ×2 IMPLANT
CANISTER SUCTION 2500CC (MISCELLANEOUS) IMPLANT
CHLORAPREP W/TINT 26ML (MISCELLANEOUS) ×2 IMPLANT
CLOTH BEACON ORANGE TIMEOUT ST (SAFETY) ×2 IMPLANT
DRAPE ARTHROSCOPY W/POUCH 114 (DRAPES) ×2 IMPLANT
ELECT MENISCUS 165MM 90D (ELECTRODE) IMPLANT
ELECT REM PT RETURN 9FT ADLT (ELECTROSURGICAL)
ELECTRODE REM PT RTRN 9FT ADLT (ELECTROSURGICAL) IMPLANT
GAUZE XEROFORM 1X8 LF (GAUZE/BANDAGES/DRESSINGS) ×2 IMPLANT
GLOVE BIO SURGEON STRL SZ7.5 (GLOVE) ×2 IMPLANT
GLOVE BIO SURGEON STRL SZ8.5 (GLOVE) ×2 IMPLANT
GLOVE BIOGEL PI IND STRL 7.0 (GLOVE) IMPLANT
GLOVE BIOGEL PI IND STRL 8 (GLOVE) ×1 IMPLANT
GLOVE BIOGEL PI IND STRL 9 (GLOVE) ×2 IMPLANT
GLOVE BIOGEL PI INDICATOR 7.0 (GLOVE)
GLOVE BIOGEL PI INDICATOR 8 (GLOVE) ×1
GLOVE BIOGEL PI INDICATOR 9 (GLOVE) ×2
GOWN PREVENTION PLUS XLARGE (GOWN DISPOSABLE) ×2 IMPLANT
GOWN PREVENTION PLUS XXLARGE (GOWN DISPOSABLE) ×2 IMPLANT
IV NS IRRIG 3000ML ARTHROMATIC (IV SOLUTION) IMPLANT
KNEE WRAP E Z 3 GEL PACK (MISCELLANEOUS) ×2 IMPLANT
NDL SAFETY ECLIPSE 18X1.5 (NEEDLE) ×1 IMPLANT
NEEDLE HYPO 18GX1.5 SHARP (NEEDLE) ×1
PACK ARTHROSCOPY DSU (CUSTOM PROCEDURE TRAY) ×2 IMPLANT
PACK BASIN DAY SURGERY FS (CUSTOM PROCEDURE TRAY) ×2 IMPLANT
PAD ALCOHOL SWAB (MISCELLANEOUS) ×2 IMPLANT
PENCIL BUTTON HOLSTER BLD 10FT (ELECTRODE) IMPLANT
SET ARTHROSCOPY TUBING (MISCELLANEOUS) ×1
SET ARTHROSCOPY TUBING LN (MISCELLANEOUS) ×1 IMPLANT
SLEEVE SCD COMPRESS KNEE MED (MISCELLANEOUS) IMPLANT
SPONGE GAUZE 4X4 12PLY (GAUZE/BANDAGES/DRESSINGS) ×2 IMPLANT
SYR 3ML 18GX1 1/2 (SYRINGE) IMPLANT
SYR 5ML LL (SYRINGE) ×2 IMPLANT
TOWEL OR 17X24 6PK STRL BLUE (TOWEL DISPOSABLE) ×2 IMPLANT
WAND STAR VAC 90 (SURGICAL WAND) IMPLANT
WATER STERILE IRR 1000ML POUR (IV SOLUTION) ×2 IMPLANT

## 2012-12-05 NOTE — Anesthesia Postprocedure Evaluation (Signed)
  Anesthesia Post-op Note  Patient: [name]  Procedure(s) Performed: Procedure(s): LEFT KNEE ARTHROSCOPY (Left)  Patient Location: PACU  Anesthesia Type:General  Level of Consciousness: awake, alert  and oriented  Airway and Oxygen Therapy: Patient Spontanous Breathing and Patient connected to face mask oxygen  Post-op Pain: mild  Post-op Assessment: Post-op Vital signs reviewed  Post-op Vital Signs: Reviewed  Complications: No apparent anesthesia complications

## 2012-12-05 NOTE — Op Note (Signed)
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 Turner Daniels M.D.  Assist: Tomi Likens. Gaylene Brooks  (present throughout entire procedure and necessary for timely completion of the procedure) Anes: General LMA  EBL: Minimal  Fluids: 800 cc   Indications: Patient with recurrent effusions and pain in her left knee MRI scan is consistent with a large loose body in the medial gutter as well as some small loose bodies. In addition we've aspirated fluid that had 28,000 cells and it was culture negative and she is pending a rheumatologic workup her CRP and sedimentation rate were normal. She she came in the office yesterday with a tense effusion we once again drained and came back with relatively normal-looking joint fluid with some blood at the end of the aspiration.. Pt has failed conservative treatment with anti-inflammatory medicines, physical therapy, and modified activites but did get good temporarily from an intra-articular cortisone injection. Pain has recurred and patient desires elective arthroscopic evaluation and treatment of knee. Risks and benefits of surgery have been discussed and questions answered.  Procedure: Patient identified by arm band and taken to the operating room at the day surgery Center. The appropriate anesthetic monitors were attached, and General LMA anesthesia was induced without difficulty. Lateral post was applied to the table and the lower extremity was prepped and draped in usual sterile fashion from the ankle to the midthigh. Time out procedure was  performed. We began the operation by making standard inferior lateral and inferior medial peripatellar portals with a #11 blade allowing introduction of the arthroscope through the inferior lateral portal and the out flow to the inferior medial portal. Pump pressure was set at 100 mmHg and diagnostic arthroscopy  Revealed on the initial placement of the portals we removed a 20 or 30 cc of bloody joint fluid, arthroscopy revealed a normal patella grade 3 focal grade 4 chondromalacia the trochlea that was debrided back to a stable margin with a 3.5 mm Gator sucker shaver there was an area of bare bone about 4 x 4 millimeters in size. We also saw multiple cartilaginous loose bodies in the joint fluid probably secondary to the trochlea donor site. These were removed with the straight biter as well as the sucker shaver. Moving to the medial compartment a large red and brown loose body that may of been attached to the synovium was identified and then removed with a grasper through the inferomedial portal was enlarged to 8 mm in size. On the back table it was dark red and brown and most consistent with focal PVNS. Some other areas of nodular synovitis removed medially and laterally but overall the synovium was not particularly inflamed. The menisci were intact and were probed, the anterior cruciate ligament and PCL are intact and were probed, the lateral tibial plateau had a small area of grade 2-3 chondromalacia that was lightly debrided. The gutters were cleared medially and laterally. He is sent multiple specimens of synovial fluid as well as tissue for Gram stain culture cell count fungal culture AFB use as well as histology on the major loose body that was removed and again  was most consistent with PVNS to visual examination to. The knee was irrigated out normal saline solution. A dressing of xerofoam 4 x 4 dressing sponges, web roll and an Ace wrap was applied. The patient was awakened extubated and taken to the  recovery without difficulty.    Signed: Nestor Lewandowsky, MD

## 2012-12-05 NOTE — Interval H&P Note (Signed)
History and Physical Interval Note:  12/05/2012 10:53 AM  [name]  has presented today for surgery, with the diagnosis of LOOSE BODY LEFT KNEE   The various methods of treatment have been discussed with the patient and family. After consideration of risks, benefits and other options for treatment, the patient has consented to  Procedure(s): LEFT KNEE ARTHROSCOPY (Left) as a surgical intervention .  The patient's history has been reviewed, patient examined, no change in status, stable for surgery.  I have reviewed the patient's chart and labs.  Questions were answered to the patient's satisfaction.     Nestor Lewandowsky

## 2012-12-05 NOTE — Transfer of Care (Signed)
Immediate Anesthesia Transfer of Care Note  Patient: [name]  Procedure(s) Performed: Procedure(s): LEFT KNEE ARTHROSCOPY (Left)  Patient Location: PACU  Anesthesia Type:General  Level of Consciousness: awake, alert  and oriented  Airway & Oxygen Therapy: Patient Spontanous Breathing and Patient connected to face mask oxygen  Post-op Assessment: Report given to PACU RN and Post -op Vital signs reviewed and stable  Post vital signs: Reviewed and stable  Complications: No apparent anesthesia complications

## 2012-12-05 NOTE — Anesthesia Preprocedure Evaluation (Signed)
Anesthesia Evaluation  Patient identified by MRN, date of birth, ID band Patient awake    Reviewed: Allergy & Precautions, H&P , NPO status , Patient's Chart, lab work & pertinent test results  History of Anesthesia Complications (+) PONV  Airway Mallampati: I TM Distance: >3 FB Neck ROM: Full    Dental  (+) Teeth Intact and Dental Advisory Given   Pulmonary  breath sounds clear to auscultation        Cardiovascular hypertension, Pt. on medications Rhythm:Regular Rate:Normal     Neuro/Psych    GI/Hepatic GERD-  Medicated,  Endo/Other    Renal/GU      Musculoskeletal   Abdominal   Peds  Hematology   Anesthesia Other Findings   Reproductive/Obstetrics                           Anesthesia Physical Anesthesia Plan  ASA: II  Anesthesia Plan: General   Post-op Pain Management:    Induction: Intravenous  Airway Management Planned: LMA  Additional Equipment:   Intra-op Plan:   Post-operative Plan: Extubation in OR  Informed Consent: I have reviewed the patients History and Physical, chart, labs and discussed the procedure including the risks, benefits and alternatives for the proposed anesthesia with the patient or authorized representative who has indicated his/her understanding and acceptance.   Dental advisory given  Plan Discussed with: CRNA, Anesthesiologist and Surgeon  Anesthesia Plan Comments:         Anesthesia Quick Evaluation

## 2012-12-05 NOTE — Anesthesia Procedure Notes (Signed)
Procedure Name: LMA Insertion Date/Time: 12/05/2012 11:07 AM Performed by: Zenia Resides D Pre-anesthesia Checklist: Patient identified, Emergency Drugs available, Suction available and Patient being monitored Patient Re-evaluated:Patient Re-evaluated prior to inductionOxygen Delivery Method: Circle System Utilized Preoxygenation: Pre-oxygenation with 100% oxygen Intubation Type: IV induction Ventilation: Mask ventilation without difficulty LMA: LMA inserted LMA Size: 4.0 Number of attempts: 1 Airway Equipment and Method: bite block Placement Confirmation: positive ETCO2 Tube secured with: Tape Dental Injury: Teeth and Oropharynx as per pre-operative assessment

## 2012-12-06 ENCOUNTER — Encounter (HOSPITAL_BASED_OUTPATIENT_CLINIC_OR_DEPARTMENT_OTHER): Payer: Self-pay | Admitting: Orthopedic Surgery

## 2012-12-08 LAB — TISSUE CULTURE: Culture: NO GROWTH

## 2012-12-10 LAB — ANAEROBIC CULTURE

## 2013-01-02 LAB — FUNGUS CULTURE W SMEAR: Fungal Smear: NONE SEEN

## 2013-01-03 ENCOUNTER — Other Ambulatory Visit: Payer: Self-pay

## 2013-01-14 ENCOUNTER — Encounter: Payer: Self-pay | Admitting: Internal Medicine

## 2013-01-16 ENCOUNTER — Ambulatory Visit (INDEPENDENT_AMBULATORY_CARE_PROVIDER_SITE_OTHER): Payer: BC Managed Care – PPO | Admitting: Internal Medicine

## 2013-01-16 ENCOUNTER — Encounter: Payer: Self-pay | Admitting: Internal Medicine

## 2013-01-16 ENCOUNTER — Other Ambulatory Visit (INDEPENDENT_AMBULATORY_CARE_PROVIDER_SITE_OTHER): Payer: BC Managed Care – PPO

## 2013-01-16 VITALS — BP 116/76 | HR 84 | Ht 64.0 in | Wt 186.0 lb

## 2013-01-16 DIAGNOSIS — K219 Gastro-esophageal reflux disease without esophagitis: Secondary | ICD-10-CM

## 2013-01-16 DIAGNOSIS — K900 Celiac disease: Secondary | ICD-10-CM

## 2013-01-16 LAB — IGA: IgA: 232 mg/dL (ref 68–378)

## 2013-01-16 NOTE — Patient Instructions (Signed)
Your physician has requested that you go to the basement for the following lab work before leaving today: Celiac  Follow up will be determined based on these labs

## 2013-01-16 NOTE — Progress Notes (Signed)
Subjective:    Patient ID: [name], female    DOB: [birth year], 42 y.o.   MRN: [id number]  HPI [name] is a 42 yo female with PMH of hypertension, dysfunctional uterine bleeding status post hysterectomy, chronic back pain, incisional hernia status post repair and GERD who is seen in followup. She was last seen in June 2014.  At that time her GERD was well controlled with PPI. She reports that she was referred to Dr. Corliss Skains to evaluate her joint pains and also possibly fibromyalgia. She reports multiple labs were done and one of her celiac panel labs was positive. Unfortunately these labs are not available for my immediate review. She is sent here to discuss this finding further.  She reports in the summer of 2014 she made a fairly drastic dietary change. She has always been a vegetarian but she began to dramatically reduce processed foods and gluten from her diet. With this she lost about 35 pounds and was able to completely wean off of omeprazole altogether. She reports her heartburn is gone. Occasionally it is triggered by certain foods such as kale, and she uses when necessary heartburn medications. Her bowel movements have been normal for her, which for her are loose stools 2-3 times a day. No blood in her stool or melena. No abdominal pain. She did note that she overall felt better with less joint pain and also less muscle pain with her new diet/dietary change.  She has no family history of celiac disease that she is aware of. He denies a history of anemia. No rashes.  Review of Systems As per history of present illness, otherwise negative  Current Medications, Allergies, Past Medical History, Past Surgical History, Family History and Social History were reviewed in [employer] record.     Objective:   Physical Exam BP 116/76  Pulse 84  Ht 5\' 4"  (1.626 m)  Wt 186 lb (84.369 kg)  BMI 31.91 kg/m2  LMP 11/19/2010 Constitutional: Well-developed and  well-nourished. No distress. HEENT: Normocephalic and atraumatic.   No scleral icterus. Cardiovascular: Normal rate, regular rhythm and intact distal pulses. No M/R/G Pulmonary/chest: Effort normal and breath sounds normal. No wheezing, rales or rhonchi. Abdominal: Soft, nontender, nondistended. Bowel sounds active throughout.  Extremities: no clubbing, cyanosis, or edema Neurological: Alert and oriented to person place and time. Psychiatric: Normal mood and affect. Behavior is normal.  CBC    Component Value Date/Time   WBC 9.2 05/17/2012 0640   RBC 4.35 05/17/2012 0640   HGB 13.1 12/05/2012 0933   HCT 35.6* 05/17/2012 0640   PLT 264 05/17/2012 0640   MCV 81.8 05/17/2012 0640   MCH 29.0 05/17/2012 0640   MCHC 35.4 05/17/2012 0640   RDW 13.2 05/17/2012 0640   LYMPHSABS 1.8 03/15/2012 1224   MONOABS 0.3 03/15/2012 1224   EOSABS 0.2 03/15/2012 1224   BASOSABS 0.0 03/15/2012 1224    CMP     Component Value Date/Time   NA 139 05/17/2012 0640   K 5.0 05/17/2012 0640   CL 104 05/17/2012 0640   CO2 29 05/17/2012 0640   GLUCOSE 136* 05/17/2012 0640   BUN 8 05/17/2012 0640   CREATININE 0.55 05/17/2012 0640   CALCIUM 9.0 05/17/2012 0640   PROT 6.9 03/15/2012 1224   ALBUMIN 4.2 03/15/2012 1224   AST 19 03/15/2012 1224   ALT 28 03/15/2012 1224   ALKPHOS 60 03/15/2012 1224   BILITOT 0.5 03/15/2012 1224   GFRNONAA >90 05/17/2012 0640   GFRAA >  90 05/17/2012 0640    Labs requested from Dr. Corliss Skains  Labs today TTG, IGA and HLA-celiac testing    Assessment & Plan:  42 yo female with PMH of hypertension, dysfunctional uterine bleeding status post hysterectomy, chronic back pain, incisional hernia status post repair and GERD who is seen in followup.  1.  Possible celiac disease -- we've discussed celiac disease today at length including both gastrointestinal and extraintestinal complications. She overall feels better with her new, mostly gluten-free diet. Though she is not strictly gluten-free. Occasionally  she does eat gluten-containing foods but much less frequently over the last 3 or 4 months.  She has noticed improvement in her muscle pains which were very nonspecific, but we have discussed how this may in fact be related to celiac disease.  We discussed upper endoscopy for small bowel biopsy, but for now she would like to avoid this if possible. I would like to get the labs that she had done recently for review but I will repeat her TTG antibody and IgA level. I also will check her HLA status which if positive for the 2 HLA antigens would support the possible diagnosis of celiac.  I advised that she visit [email or web address] for more information. For now she will continue with her current, mostly gluten-free diet. Further recommendations after lab results are available.  2.  GERD -- resolved with weight loss with only very infrequent breakthrough. She can use over-the-counter Pepcid or Zantac for breakthrough symptoms per box instructions

## 2013-01-17 LAB — TISSUE TRANSGLUTAMINASE, IGA: Tissue Transglutaminase Ab, IgA: 14.1 U/mL (ref <20)

## 2013-01-21 LAB — CELIAC DISEASE HLA DQ ASSOC.

## 2013-02-28 LAB — HM MAMMOGRAPHY: HM MAMMO: NORMAL

## 2013-03-08 ENCOUNTER — Telehealth: Payer: Self-pay | Admitting: *Deleted

## 2013-03-08 MED ORDER — OSELTAMIVIR PHOSPHATE 75 MG PO CAPS: 75.0000 mg | ORAL_CAPSULE | Freq: Every day | ORAL | Status: DC

## 2013-03-08 NOTE — Telephone Encounter (Signed)
Ok for Tamiflu 75mg 1 tab daily x10 days 

## 2013-03-08 NOTE — Telephone Encounter (Signed)
Med filled.  

## 2013-03-08 NOTE — Telephone Encounter (Signed)
Patient called and stated that her kid has been diagnosed with the flu and would like to know she could get some Tamiflu to protect herself. Please advise. SW

## 2013-03-27 ENCOUNTER — Other Ambulatory Visit: Payer: Self-pay

## 2013-03-27 DIAGNOSIS — Z1231 Encounter for screening mammogram for malignant neoplasm of breast: Secondary | ICD-10-CM

## 2013-04-18 ENCOUNTER — Ambulatory Visit: Payer: BC Managed Care – PPO

## 2013-05-02 ENCOUNTER — Ambulatory Visit
Admission: RE | Admit: 2013-05-02 | Discharge: 2013-05-02 | Disposition: A | Discharge status: Home / Self Care | Payer: Self-pay | Source: Ambulatory Visit

## 2013-05-02 DIAGNOSIS — Z1231 Encounter for screening mammogram for malignant neoplasm of breast: Secondary | ICD-10-CM

## 2013-06-27 ENCOUNTER — Ambulatory Visit (INDEPENDENT_AMBULATORY_CARE_PROVIDER_SITE_OTHER): Payer: BC Managed Care – PPO | Admitting: Family Medicine

## 2013-06-27 ENCOUNTER — Encounter: Payer: Self-pay | Admitting: Family Medicine

## 2013-06-27 VITALS — BP 128/78 | HR 93 | Temp 98.2°F | Resp 16 | Wt 193.0 lb

## 2013-06-27 DIAGNOSIS — L723 Sebaceous cyst: Secondary | ICD-10-CM | POA: Insufficient documentation

## 2013-06-27 NOTE — Progress Notes (Signed)
Pre visit review using our clinic review tool, if applicable. No additional management support is needed unless otherwise documented below in the visit note. 

## 2013-06-27 NOTE — Patient Instructions (Signed)
Schedule your complete physical at your convenience Hot compresses as needed to the back If you develop pain or redness- please call for antibiotics Call with any questions or concerns [place]!!!

## 2013-06-27 NOTE — Progress Notes (Signed)
   Subjective:    Patient ID: [name], female    DOB: [birth year], 43 y.o.   MRN: [id number]  HPI Pt reports 'sore spot' on back for a few weeks.  Husband told her it was a 'red bump'.  Husband squeezed it last night and 'stuff came out'.  Painful to touch or lie down on.   Review of Systems For ROS see HPI     Objective:   Physical Exam  Vitals reviewed. Constitutional: She appears well-developed and well-nourished. No distress.  Skin: Skin is warm and dry. No erythema.  Small sebaceous cyst of mid back- copious material expressed w/ gentle, steady pressure.  Pain improved instantly.  No evidence of infxn          Assessment & Plan:

## 2013-06-27 NOTE — Assessment & Plan Note (Signed)
New.  Area drained w/ gentle, steady pressure.  No evidence of infxn.  No need for abx at this time but pt to call if she develops increased pain or redness.  Cautioned her that this is likely to recur.  Will follow.

## 2013-09-26 ENCOUNTER — Encounter: Payer: Self-pay | Admitting: Family Medicine

## 2013-09-26 ENCOUNTER — Ambulatory Visit (INDEPENDENT_AMBULATORY_CARE_PROVIDER_SITE_OTHER): Payer: BC Managed Care – PPO | Admitting: Family Medicine

## 2013-09-26 VITALS — BP 130/82 | HR 81 | Temp 98.2°F | Resp 16 | Wt 201.5 lb

## 2013-09-26 DIAGNOSIS — R002 Palpitations: Secondary | ICD-10-CM | POA: Insufficient documentation

## 2013-09-26 LAB — BASIC METABOLIC PANEL
BUN: 10 mg/dL (ref 6–23)
CALCIUM: 9.5 mg/dL (ref 8.4–10.5)
CO2: 29 mEq/L (ref 19–32)
CREATININE: 0.5 mg/dL (ref 0.4–1.2)
Chloride: 105 mEq/L (ref 96–112)
GFR: 132.74 mL/min (ref >60.00)
GLUCOSE: 89 mg/dL (ref 70–99)
Potassium: 4.1 mEq/L (ref 3.5–5.1)
Sodium: 139 mEq/L (ref 135–145)

## 2013-09-26 LAB — CBC WITH DIFFERENTIAL/PLATELET
BASOS ABS: 0 10*3/uL (ref 0.0–0.1)
Basophils Relative: 0.6 % (ref 0.0–3.0)
Eosinophils Absolute: 0.2 10*3/uL (ref 0.0–0.7)
Eosinophils Relative: 3.4 % (ref 0.0–5.0)
HCT: 41.6 % (ref 36.0–46.0)
Hemoglobin: 14 g/dL (ref 12.0–15.0)
LYMPHS PCT: 35.1 % (ref 12.0–46.0)
Lymphs Abs: 1.9 10*3/uL (ref 0.7–4.0)
MCHC: 33.7 g/dL (ref 30.0–36.0)
MCV: 88.7 fl (ref 78.0–100.0)
Monocytes Absolute: 0.3 10*3/uL (ref 0.1–1.0)
Monocytes Relative: 5.9 % (ref 3.0–12.0)
NEUTROS PCT: 55 % (ref 43.0–77.0)
Neutro Abs: 2.9 10*3/uL (ref 1.4–7.7)
PLATELETS: 328 10*3/uL (ref 150.0–400.0)
RBC: 4.69 Mil/uL (ref 3.87–5.11)
RDW: 13.2 % (ref 11.5–15.5)
WBC: 5.4 10*3/uL (ref 4.0–10.5)

## 2013-09-26 LAB — TSH: TSH: 0.97 u[IU]/mL (ref 0.35–4.50)

## 2013-09-26 NOTE — Patient Instructions (Signed)
Your EKG looks great! We'll notify you of your lab results Try and limit your caffeine intake and make better food choices If the palpitations continue, please call so we can refer you to cards or get a holter monitor to observe Call with any questions or concerns Hang in there!

## 2013-09-26 NOTE — Progress Notes (Signed)
   Subjective:    Patient ID: [name], female    DOB: June 23, 1970, 43 y.o.   MRN: [id number]  HPI Palpitations- 'i occasionally feel fluttery'  sxs started ~2 weeks ago.  sxs are intermittent.  sxs will last for seconds.  sxs are more notable while resting.  Occuring multiple times/day.  No CP.  No SOB.  No N/V.  Pt reports 'terrible' eating 'for the last couple months'.  Restarted caffeine w/in the last month- several cups of coffee/day.  No medication changes or supplements.  Is in process of weaning vivelle patch.   Review of Systems For ROS see HPI     Objective:   Physical Exam  Vitals reviewed. Constitutional: She is oriented to person, place, and time. She appears well-developed and well-nourished. No distress.  HENT:  Head: Normocephalic and atraumatic.  Eyes: Conjunctivae and EOM are normal. Pupils are equal, round, and reactive to light.  Neck: Normal range of motion. Neck supple. No thyromegaly present.  Cardiovascular: Normal rate, regular rhythm, normal heart sounds and intact distal pulses.   No murmur heard. Pulmonary/Chest: Effort normal and breath sounds normal. No respiratory distress.  Abdominal: Soft. She exhibits no distension. There is no tenderness.  Musculoskeletal: She exhibits no edema.  Lymphadenopathy:    She has no cervical adenopathy.  Neurological: She is alert and oriented to person, place, and time.  Skin: Skin is warm and dry.  Psychiatric: She has a normal mood and affect. Her behavior is normal.          Assessment & Plan:

## 2013-09-26 NOTE — Assessment & Plan Note (Signed)
New.  Pt's sxs are likely multifactorial and in large part due to large amounts of caffeine.  Check labs to r/o metabolic causes- thyroid, anemia, electrolyte abnormality.  EKG WNL.  If sxs persist, will refer to cards for Holter.  Pt expressed understanding and is in agreement w/ plan.

## 2013-09-26 NOTE — Progress Notes (Signed)
Pre visit review using our clinic review tool, if applicable. No additional management support is needed unless otherwise documented below in the visit note. 

## 2013-10-28 ENCOUNTER — Encounter: Payer: Self-pay | Admitting: Family Medicine

## 2013-10-28 ENCOUNTER — Ambulatory Visit (INDEPENDENT_AMBULATORY_CARE_PROVIDER_SITE_OTHER): Payer: BC Managed Care – PPO | Admitting: Family Medicine

## 2013-10-28 VITALS — BP 120/82 | HR 79 | Temp 98.0°F | Resp 16 | Ht 64.0 in | Wt 205.1 lb

## 2013-10-28 DIAGNOSIS — Z23 Encounter for immunization: Secondary | ICD-10-CM

## 2013-10-28 DIAGNOSIS — H04129 Dry eye syndrome of unspecified lacrimal gland: Secondary | ICD-10-CM

## 2013-10-28 DIAGNOSIS — H04121 Dry eye syndrome of right lacrimal gland: Secondary | ICD-10-CM | POA: Insufficient documentation

## 2013-10-28 DIAGNOSIS — Z0000 Encounter for general adult medical examination without abnormal findings: Secondary | ICD-10-CM

## 2013-10-28 NOTE — Progress Notes (Signed)
Pre visit review using our clinic review tool, if applicable. No additional management support is needed unless otherwise documented below in the visit note. 

## 2013-10-28 NOTE — Assessment & Plan Note (Signed)
Pt's PE WNL.  UTD on GYN.  Pt is considering transferring her GYN care to me since she has had TAH-BSO.  Check labs.  Anticipatory guidance provided.

## 2013-10-28 NOTE — Progress Notes (Signed)
   Subjective:    Patient ID: [name], female    DOB: 21-Mar-1970, 43 y.o.   MRN: [id number]  HPI CPE- UTD on mammo, has GYN ([facility]).  Dry eye- occuring on R, pt is seeing [facility] and getting temporary tear duct plugs q3 months.  Would like a 2nd opinion.   Review of Systems Patient reports no vision/ hearing changes, adenopathy,fever, weight change,  persistant/recurrent hoarseness , swallowing issues, chest pain, palpitations, edema, persistant/recurrent cough, hemoptysis, dyspnea (rest/exertional/paroxysmal nocturnal), gastrointestinal bleeding (melena, rectal bleeding), abdominal pain, significant heartburn, bowel changes, GU symptoms (dysuria, hematuria, incontinence), Gyn symptoms (abnormal  bleeding, pain),  syncope, focal weakness, memory loss, numbness & tingling, skin/hair/nail changes, abnormal bruising or bleeding, anxiety, or depression.     Objective:   Physical Exam General Appearance:    Alert, cooperative, no distress, appears stated age, obese  Head:    Normocephalic, without obvious abnormality, atraumatic  Eyes:    PERRL, conjunctiva/corneas clear, EOM's intact, fundi    benign, both eyes  Ears:    Normal TM's and external ear canals, both ears  Nose:   Nares normal, septum midline, mucosa normal, no drainage    or sinus tenderness  Throat:   Lips, mucosa, and tongue normal; teeth and gums normal  Neck:   Supple, symmetrical, trachea midline, no adenopathy;    Thyroid: no enlargement/tenderness/nodules  Back:     Symmetric, no curvature, ROM normal, no CVA tenderness  Lungs:     Clear to auscultation bilaterally, respirations unlabored  Chest Wall:    No tenderness or deformity   Heart:    Regular rate and rhythm, S1 and S2 normal, no murmur, rub   or gallop  Breast Exam:    Deferred to GYN  Abdomen:     Soft, non-tender, bowel sounds active all four quadrants,    no masses, no organomegaly  Genitalia:    Deferred to GYN  Rectal:    Extremities:    Extremities normal, atraumatic, no cyanosis or edema  Pulses:   2+ and symmetric all extremities  Skin:   Skin color, texture, turgor normal, no rashes or lesions  Lymph nodes:   Cervical, supraclavicular, and axillary nodes normal  Neurologic:   CNII-XII intact, normal strength, sensation and reflexes    throughout          Assessment & Plan:

## 2013-10-28 NOTE — Patient Instructions (Signed)
Follow up in 1 year or as needed Keep up the good work!  You look great! We'll notify you of your lab results and make any changes if needed We'll call you with your eye exam Call with any questions or concerns Happy Labor Day!

## 2013-10-28 NOTE — Assessment & Plan Note (Signed)
New.  Pt has temporary tear duct plugs q3 months at [facility].  Would like a 2nd opinion.  Referral placed.

## 2013-10-29 ENCOUNTER — Other Ambulatory Visit: Payer: BC Managed Care – PPO

## 2013-10-29 LAB — CBC WITH DIFFERENTIAL/PLATELET
BASOS ABS: 0 10*3/uL (ref 0.0–0.1)
Basophils Relative: 0.7 % (ref 0.0–3.0)
EOS ABS: 0.2 10*3/uL (ref 0.0–0.7)
Eosinophils Relative: 4.6 % (ref 0.0–5.0)
HEMATOCRIT: 40.7 % (ref 36.0–46.0)
Hemoglobin: 13.7 g/dL (ref 12.0–15.0)
LYMPHS ABS: 1.9 10*3/uL (ref 0.7–4.0)
Lymphocytes Relative: 37.1 % (ref 12.0–46.0)
MCHC: 33.7 g/dL (ref 30.0–36.0)
MCV: 89.5 fl (ref 78.0–100.0)
MONOS PCT: 6.8 % (ref 3.0–12.0)
Monocytes Absolute: 0.3 10*3/uL (ref 0.1–1.0)
NEUTROS ABS: 2.5 10*3/uL (ref 1.4–7.7)
Neutrophils Relative %: 50.8 % (ref 43.0–77.0)
PLATELETS: 318 10*3/uL (ref 150.0–400.0)
RBC: 4.55 Mil/uL (ref 3.87–5.11)
RDW: 13.5 % (ref 11.5–15.5)
WBC: 5 10*3/uL (ref 4.0–10.5)

## 2013-10-29 LAB — LIPID PANEL
Cholesterol: 140 mg/dL (ref 0–200)
HDL: 51.6 mg/dL (ref >39.00)
LDL Cholesterol: 75 mg/dL (ref 0–99)
NonHDL: 88.4
TRIGLYCERIDES: 67 mg/dL (ref 0.0–149.0)
Total CHOL/HDL Ratio: 3
VLDL: 13.4 mg/dL (ref 0.0–40.0)

## 2013-10-29 LAB — BASIC METABOLIC PANEL
BUN: 12 mg/dL (ref 6–23)
CHLORIDE: 105 meq/L (ref 96–112)
CO2: 29 mEq/L (ref 19–32)
Calcium: 9.4 mg/dL (ref 8.4–10.5)
Creatinine, Ser: 0.6 mg/dL (ref 0.4–1.2)
GFR: 127.13 mL/min (ref >60.00)
GLUCOSE: 85 mg/dL (ref 70–99)
Potassium: 4.5 mEq/L (ref 3.5–5.1)
SODIUM: 141 meq/L (ref 135–145)

## 2013-10-29 LAB — HEPATIC FUNCTION PANEL
ALBUMIN: 4.1 g/dL (ref 3.5–5.2)
ALT: 22 U/L (ref 0–35)
AST: 22 U/L (ref 0–37)
Alkaline Phosphatase: 53 U/L (ref 39–117)
Bilirubin, Direct: 0.1 mg/dL (ref 0.0–0.3)
TOTAL PROTEIN: 6.8 g/dL (ref 6.0–8.3)
Total Bilirubin: 0.6 mg/dL (ref 0.2–1.2)

## 2013-10-29 LAB — TSH: TSH: 1.12 u[IU]/mL (ref 0.35–4.50)

## 2013-10-29 LAB — VITAMIN D 25 HYDROXY (VIT D DEFICIENCY, FRACTURES): VITD: 29.39 ng/mL — ABNORMAL LOW (ref 30.00–100.00)

## 2013-12-30 ENCOUNTER — Encounter: Payer: Self-pay | Admitting: Family Medicine

## 2014-02-12 ENCOUNTER — Encounter: Payer: Self-pay | Admitting: Medical

## 2014-02-12 ENCOUNTER — Ambulatory Visit (INDEPENDENT_AMBULATORY_CARE_PROVIDER_SITE_OTHER): Payer: BC Managed Care – PPO | Admitting: Medical

## 2014-02-12 VITALS — BP 143/85 | HR 92 | Temp 98.4°F | Ht 64.0 in | Wt 216.2 lb

## 2014-02-12 DIAGNOSIS — IMO0001 Reserved for inherently not codable concepts without codable children: Secondary | ICD-10-CM

## 2014-02-12 DIAGNOSIS — R030 Elevated blood-pressure reading, without diagnosis of hypertension: Secondary | ICD-10-CM

## 2014-02-12 NOTE — Progress Notes (Signed)
Subjective:    Patient ID: [name], female    DOB: 10/21/1970, 43 y.o.   MRN: [id number]  HPI   Pt in for eval of blood pressure. Pt gives a history of some mild bp elevation before at pharmacy in the past. She can't remember number.   Pt the other day took supplement and her blood was over the recommended number. But she can't remember the number on that day either. But she knows it was elevated little bit.  The supplement she used has various components.  Caffeine also but does not specify amount the package label.  At the time  when she reported recent mild elevation  she had mild faint ha. No chest pain or gross motor Madelyn Brunner function  Past Medical History  Diagnosis Date  . H/O varicella     As an infant.  . Chronic back pain     H/O  . Fibroid     During first pregnancy  . GBS carrier   . Rh negative status during pregnancy   . Irregular uterine bleeding   . Cancer 2002    Left leg  . Monilial vaginitis 07/27/04  . First trimester bleeding 2008  . Constipation in pregnancy 03/2006  . AMA (advanced maternal age) multigravida 83+   . H/O amenorrhea 10/2006  . Acute mastitis of left breast 10/04/2007  . Fibromyalgia   . Endometriosis     h/o  . SAB (spontaneous abortion)     h/o x 2  . Complication of anesthesia     Difficult time waking up  . Heart murmur   . GERD (gastroesophageal reflux disease)   . Headache(784.0)   . Environmental allergies   . Seasonal allergies   . TMJ syndrome   . Celiac disease     History   Social History  . Marital Status: Married    Spouse Name: N/A    Number of Children: N/A  . Years of Education: N/A   Occupational History  . Not on file.   Social History Main Topics  . Smoking status: Former Smoker    Types: Cigarettes    Quit date: 02/29/1988  . Smokeless tobacco: Never Used  . Alcohol Use: No  . Drug Use: No  . Sexual Activity: Yes    Birth Control/ Protection: Surgical     Comment: BTL   Other Topics  Concern  . Not on file   Social History Narrative    Past Surgical History  Procedure Laterality Date  . Wisdom tooth extraction  1989  . Appendectomy  2002  . Endocervical polyp removal  2001  . Melanoma removal   2003  . Uterine curettage  01/2009  . Endometrial ablation  2010  . Hysteroscopy w/ endometrial ablation  01/28/2009  . Tendon release  2004  . Salpingoophorectomy  12/08/2011    Procedure: SALPINGO OOPHERECTOMY;  Surgeon: Eldred Manges, MD;  Location: [facility];  Service: Gynecology;  Laterality: Bilateral;  . Dilation and evacuation    . Abdominal hysterectomy  2013  . Melanoma excision  2002    Left Leg  . Hernia repair  05/16/12    incisional hernia repair  . Ventral hernia repair N/A 05/16/2012    Procedure: LAPAROSCOPIC VENTRAL HERNIA;  Surgeon: Joyice Faster. Cornett, MD;  Location: [facility];  Service: General;  Laterality: N/A;  . Insertion of mesh N/A 05/16/2012    Procedure: INSERTION OF MESH;  Surgeon: Joyice Faster. Cornett, MD;  Location: [facility];  Service: General;  Laterality: N/A;  . Knee arthroscopy Left 12/05/2012    Procedure: LEFT KNEE ARTHROSCOPY;  Surgeon: Kerin Salen, MD;  Location: [place];  Service: Orthopedics;  Laterality: Left;    Family History  Problem Relation Age of Onset  . Hypertension Mother   . Cancer Mother     breast  . Heart disease Father     atrial fib  . Hypertension Father   . Cancer Father     Lyphoma,Bladder  . Atrial fibrillation Father   . Cancer Maternal Grandmother     breast  . Heart disease Maternal Grandfather     MI  . Heart disease Paternal Grandfather     MI  . Cancer Maternal Aunt     ovarian  . Diverticulitis Brother     Allergies  Allergen Reactions  . Betadine [Povidone Iodine] Other (See Comments)    Dx with allergy testing  . Cymbalta [Duloxetine Hcl] Other (See Comments)    Makes her feel crazy, out of body  . Shellfish Allergy Other (See Comments)    Dx with allergy testing     Current Outpatient Prescriptions on File Prior to Visit  Medication Sig Dispense Refill  . Beclomethasone Dipropionate (QNASL) 80 MCG/ACT AERS Place 2 Squirts into the nose daily as needed (allergies). Each nostril    . Calcium Carbonate-Vit D-Min (CALCIUM 1200 PO) Take by mouth.    . cetirizine (ZYRTEC) 10 MG tablet Take 10 mg by mouth daily.    [name] estradiol (VIVELLE-DOT) 0.05 MG/24HR Place 1 patch onto the skin 2 (two) times a week. Monday and Thursday    . multivitamin (THERAGRAN) per tablet Take 1 tablet by mouth daily.       No current facility-administered medications on file prior to visit.    BP 143/85 mmHg  Pulse 92  Temp(Src) 98.4 F (36.9 C) (Oral)  Ht 5\' 4"  (1.626 m)  Wt 216 lb 3.2 oz (98.068 kg)  BMI 37.09 kg/m2  SpO2 95%  LMP 11/19/2010      Review of Systems  Constitutional: Negative for fever, chills, diaphoresis, activity change and fatigue.  Respiratory: Negative for cough, chest tightness and shortness of breath.   Cardiovascular: Negative for chest pain, palpitations and leg swelling.  Gastrointestinal: Negative for nausea, vomiting and abdominal pain.  Musculoskeletal: Negative for neck pain and neck stiffness.  Neurological: Negative for dizziness, tremors, seizures, syncope, facial asymmetry, speech difficulty, weakness, light-headedness, numbness and headaches.  Psychiatric/Behavioral: Negative for behavioral problems, confusion and agitation. The patient is not nervous/anxious.        Objective:   Physical Exam  General Mental Status- Alert. General Appearance- Not in acute distress.   Skin General: Color- Normal Color. Moisture- Normal Moisture.  Neck Carotid Arteries- Normal color. Moisture- Normal Moisture. No carotid bruits. No JVD.  Chest and Lung Exam Auscultation: Breath Sounds:-Normal.  Cardiovascular Auscultation:Rythm- Regular. Murmurs & Other Heart Sounds:Auscultation of the heart reveals- No  Murmurs.  Abdomen Inspection:-Inspeection Normal. Palpation/Percussion:Note:No mass. Palpation and Percussion of the abdomen reveal- Non Tender, Non Distended + BS, no rebound or guarding.    Neurologic Cranial Nerve exam:- CN III-XII intact(No nystagmus), symmetric smile. Romberg Exam:- Negative.  Finger to Nose:- Normal/Intact Strength:- 5/5 equal and symmetric strength both upper and lower extremities.      Assessment & Plan:

## 2014-02-12 NOTE — Patient Instructions (Addendum)
Your blood pressure is borderline today. I want you to reduce caffeine intake, stop new supplement and follow dash diet. Try to exercise more. Check bp daily for next 2 wks. Call us and give [place] readings. If bp reading 140/90  or higher then will rx low dose bp medication. If reading closer to 130/80 recommend liftestyle measures.  DASH Eating Plan DASH stands for "Dietary Approaches to Stop Hypertension." The DASH eating plan is a healthy eating plan that has been shown to reduce high blood pressure (hypertension). Additional health benefits may include reducing the risk of type 2 diabetes mellitus, heart disease, and stroke. The DASH eating plan may also help with weight loss. WHAT DO I NEED TO KNOW ABOUT THE DASH EATING PLAN? For the DASH eating plan, you will follow these general guidelines:  Choose foods with a percent daily value for sodium of less than 5% (as listed on the food label).  Use salt-free seasonings or herbs instead of table salt or sea salt.  Check with your health care provider or pharmacist before using salt substitutes.  Eat lower-sodium products, often labeled as "lower sodium" or "no salt added."  Eat fresh foods.  Eat more vegetables, fruits, and low-fat dairy products.  Choose whole grains. Look for the word "whole" as the first word in the ingredient list.  Choose fish and skinless chicken or [place] more often than red meat. Limit fish, poultry, and meat to 6 oz (170 g) each day.  Limit sweets, desserts, sugars, and sugary drinks.  Choose heart-healthy fats.  Limit cheese to 1 oz (28 g) per day.  Eat more home-cooked food and less restaurant, buffet, and fast food.  Limit fried foods.  Cook foods using methods other than frying.  Limit canned vegetables. If you do use them, rinse them well to decrease the sodium.  When eating at a restaurant, ask that your food be prepared with less salt, or no salt if possible. WHAT FOODS CAN I EAT? Seek help from a  dietitian for individual calorie needs. Grains Whole grain or whole wheat bread. Brown rice. Whole grain or whole wheat pasta. Quinoa, bulgur, and whole grain cereals. Low-sodium cereals. Corn or whole wheat flour tortillas. Whole grain cornbread. Whole grain crackers. Low-sodium crackers. Vegetables Fresh or frozen vegetables (raw, steamed, roasted, or grilled). Low-sodium or reduced-sodium tomato and vegetable juices. Low-sodium or reduced-sodium tomato sauce and paste. Low-sodium or reduced-sodium canned vegetables.  Fruits All fresh, canned (in natural juice), or frozen fruits. Meat and Other Protein Products Ground beef (85% or leaner), grass-fed beef, or beef trimmed of fat. Skinless chicken or [place]. Ground chicken or [place]. Pork trimmed of fat. All fish and seafood. Eggs. Dried beans, peas, or lentils. Unsalted nuts and seeds. Unsalted canned beans. Dairy Low-fat dairy products, such as skim or 1% milk, 2% or reduced-fat cheeses, low-fat ricotta or cottage cheese, or plain low-fat yogurt. Low-sodium or reduced-sodium cheeses. Fats and Oils Tub margarines without trans fats. Light or reduced-fat mayonnaise and salad dressings (reduced sodium). Avocado. Safflower, olive, or canola oils. Natural peanut or almond butter. Other Unsalted popcorn and pretzels. The items listed above may not be a complete list of recommended foods or beverages. Contact your dietitian for more options. WHAT FOODS ARE NOT RECOMMENDED? Grains White bread. White pasta. White rice. Refined cornbread. Bagels and croissants. Crackers that contain trans fat. Vegetables Creamed or fried vegetables. Vegetables in a cheese sauce. Regular canned vegetables. Regular canned tomato sauce and paste. Regular tomato and vegetable juices.  Fruits Dried fruits. Canned fruit in light or heavy syrup. Fruit juice. Meat and Other Protein Products Fatty cuts of meat. Ribs, chicken wings, bacon, sausage, bologna, salami,  chitterlings, fatback, hot dogs, bratwurst, and packaged luncheon meats. Salted nuts and seeds. Canned beans with salt. Dairy Whole or 2% milk, cream, half-and-half, and cream cheese. Whole-fat or sweetened yogurt. Full-fat cheeses or blue cheese. Nondairy creamers and whipped toppings. Processed cheese, cheese spreads, or cheese curds. Condiments Onion and garlic salt, seasoned salt, table salt, and sea salt. Canned and packaged gravies. Worcestershire sauce. Tartar sauce. Barbecue sauce. Teriyaki sauce. Soy sauce, including reduced sodium. Steak sauce. Fish sauce. Oyster sauce. Cocktail sauce. Horseradish. Ketchup and mustard. Meat flavorings and tenderizers. Bouillon cubes. Hot sauce. Tabasco sauce. Marinades. Taco seasonings. Relishes. Fats and Oils Butter, stick margarine, lard, shortening, ghee, and bacon fat. Coconut, palm kernel, or palm oils. Regular salad dressings. Other Pickles and olives. Salted popcorn and pretzels. The items listed above may not be a complete list of foods and beverages to avoid. Contact your dietitian for more information. WHERE CAN I FIND MORE INFORMATION? National Heart, Lung, and Blood Institute: travelstabloid.com Document Released: 02/03/2011 Document Revised: 07/01/2013 Document Reviewed: 12/19/2012 [facility] Patient Information 2015 [place], [place]. This information is not intended to replace advice given to you by your health care provider. Make sure you discuss any questions you have with your health care provider.

## 2014-02-12 NOTE — Progress Notes (Signed)
Pre visit review using our clinic review tool, if applicable. No additional management support is needed unless otherwise documented below in the visit note. 

## 2014-02-12 NOTE — Assessment & Plan Note (Signed)
Your blood pressure is borderline today. I want you to reduce caffieine intake, stop new supplement and follow dash diet. Try to exercise more. Check bp daily for next 2 wks. Call us and give [place] readings. If bp reading 140/90  or higher then will rx low dose bp medication. If reading closer to 130/80 recommend liftestyle measures.  I advised patient that low dose medication such as lisinopril 5 mg or amlodipine 5 mg could be used in the event that her blood pressure is elevated on a regular basis. Also low dose diuretic could be considered as well.  Note I did advise patient to go ahead and get an over-the-counter cuff as this will be helpful in the future if there are any blood pressure questions.

## 2014-03-19 ENCOUNTER — Other Ambulatory Visit: Payer: Self-pay | Admitting: Dermatology

## 2014-03-27 ENCOUNTER — Ambulatory Visit (INDEPENDENT_AMBULATORY_CARE_PROVIDER_SITE_OTHER): Payer: BLUE CROSS/BLUE SHIELD | Admitting: Medical

## 2014-03-27 ENCOUNTER — Encounter: Payer: Self-pay | Admitting: Medical

## 2014-03-27 VITALS — BP 148/94 | HR 81 | Temp 98.3°F | Ht 64.0 in | Wt 212.8 lb

## 2014-03-27 DIAGNOSIS — I10 Essential (primary) hypertension: Secondary | ICD-10-CM

## 2014-03-27 MED ORDER — LISINOPRIL 10 MG PO TABS: 10.0000 mg | ORAL_TABLET | Freq: Every day | ORAL | Status: DC

## 2014-03-27 NOTE — Assessment & Plan Note (Signed)
For your htn, I will give you lisnopril rx.   Diet and exercise.  Follow up in 3 months for bp check and repeat cmp

## 2014-03-27 NOTE — Assessment & Plan Note (Signed)
For your htn, I will give you lisnopril rx.   Diet and exercise.  Follow up in 3 months for bp check and repeat cmp.

## 2014-03-27 NOTE — Progress Notes (Signed)
Pre visit review using our clinic review tool, if applicable. No additional management support is needed unless otherwise documented below in the visit note. 

## 2014-03-27 NOTE — Progress Notes (Signed)
Subjective:    Patient ID: [name], female    DOB: Jul 22, 1970, 44 y.o.   MRN: [id number]  HPI  BP Readings from Last 3 Encounters:  03/27/14 148/94  02/12/14 143/85  10/28/13 120/82   Pt bp is intially high when lpn checked and it was 148/94.  Pt at home readings area about the same on review.  Pt has no cardiac or neurologic signs or symptoms. Only occasional feel like heart beats hard. Transient one second or so the other am. But not frequent.. I wanted her to get bp cuff and her readings are high like today.  Past Medical History  Diagnosis Date  . H/O varicella     As an infant.  . Chronic back pain     H/O  . Fibroid     During first pregnancy  . GBS carrier   . Rh negative status during pregnancy   . Irregular uterine bleeding   . Cancer 2002    Left leg  . Monilial vaginitis 07/27/04  . First trimester bleeding 2008  . Constipation in pregnancy 03/2006  . AMA (advanced maternal age) multigravida 76+   . H/O amenorrhea 10/2006  . Acute mastitis of left breast 10/04/2007  . Fibromyalgia   . Endometriosis     h/o  . SAB (spontaneous abortion)     h/o x 2  . Complication of anesthesia     Difficult time waking up  . Heart murmur   . GERD (gastroesophageal reflux disease)   . Headache(784.0)   . Environmental allergies   . Seasonal allergies   . TMJ syndrome   . Celiac disease     History   Social History  . Marital Status: Married    Spouse Name: N/A    Number of Children: N/A  . Years of Education: N/A   Occupational History  . Not on file.   Social History Main Topics  . Smoking status: Former Smoker    Types: Cigarettes    Quit date: 02/29/1988  . Smokeless tobacco: Never Used  . Alcohol Use: No  . Drug Use: No  . Sexual Activity: Yes    Birth Control/ Protection: Surgical     Comment: BTL   Other Topics Concern  . Not on file   Social History Narrative    Past Surgical History  Procedure Laterality Date  . Wisdom tooth  extraction  1989  . Appendectomy  2002  . Endocervical polyp removal  2001  . Melanoma removal   2003  . Uterine curettage  01/2009  . Endometrial ablation  2010  . Hysteroscopy w/ endometrial ablation  01/28/2009  . Tendon release  2004  . Salpingoophorectomy  12/08/2011    Procedure: SALPINGO OOPHERECTOMY;  Surgeon: Eldred Manges, MD;  Location: [facility];  Service: Gynecology;  Laterality: Bilateral;  . Dilation and evacuation    . Abdominal hysterectomy  2013  . Melanoma excision  2002    Left Leg  . Hernia repair  05/16/12    incisional hernia repair  . Ventral hernia repair N/A 05/16/2012    Procedure: LAPAROSCOPIC VENTRAL HERNIA;  Surgeon: Joyice Faster. Cornett, MD;  Location: [place];  Service: General;  Laterality: N/A;  . Insertion of mesh N/A 05/16/2012    Procedure: INSERTION OF MESH;  Surgeon: Joyice Faster. Cornett, MD;  Location: [place];  Service: General;  Laterality: N/A;  . Knee arthroscopy Left 12/05/2012    Procedure: LEFT KNEE ARTHROSCOPY;  Surgeon: Pilar Plate  Karmen Bongo, MD;  Location: [facility];  Service: Orthopedics;  Laterality: Left;    Family History  Problem Relation Age of Onset  . Hypertension Mother   . Cancer Mother     breast  . Heart disease Father     atrial fib  . Hypertension Father   . Cancer Father     Lyphoma,Bladder  . Atrial fibrillation Father   . Cancer Maternal Grandmother     breast  . Heart disease Maternal Grandfather     MI  . Heart disease Paternal Grandfather     MI  . Cancer Maternal Aunt     ovarian  . Diverticulitis Brother     Allergies  Allergen Reactions  . Betadine [Povidone Iodine] Other (See Comments)    Dx with allergy testing  . Cymbalta [Duloxetine Hcl] Other (See Comments)    Makes her feel crazy, out of body  . Shellfish Allergy Other (See Comments)    Dx with allergy testing    Current Outpatient Prescriptions on File Prior to Visit  Medication Sig Dispense Refill  . Beclomethasone Dipropionate  (QNASL) 80 MCG/ACT AERS Place 2 Squirts into the nose daily as needed (allergies). Each nostril    . Calcium Carbonate-Vit D-Min (CALCIUM 1200 PO) Take by mouth.    . cetirizine (ZYRTEC) 10 MG tablet Take 10 mg by mouth daily.    [name] estradiol (VIVELLE-DOT) 0.05 MG/24HR Place 1 patch onto the skin 2 (two) times a week. Monday and Thursday    . multivitamin (THERAGRAN) per tablet Take 1 tablet by mouth daily.       No current facility-administered medications on file prior to visit.    BP 148/94 mmHg  Pulse 81  Temp(Src) 98.3 F (36.8 C) (Oral)  Ht 5\' 4"  (1.626 m)  Wt 212 lb 12.8 oz (96.525 kg)  BMI 36.51 kg/m2  SpO2 96%  LMP 11/19/2010     Review of Systems  Constitutional: Negative for fever, chills, diaphoresis, activity change and fatigue.  Respiratory: Negative for cough, chest tightness and shortness of breath.   Cardiovascular: Negative for chest pain, palpitations and leg swelling.  Gastrointestinal: Negative for nausea, vomiting and abdominal pain.  Musculoskeletal: Negative for neck pain and neck stiffness.  Neurological: Negative for dizziness, tremors, seizures, syncope, facial asymmetry, speech difficulty, weakness, light-headedness, numbness and headaches.  Psychiatric/Behavioral: Negative for behavioral problems, confusion and agitation. The patient is not nervous/anxious.        Objective:   Physical Exam    General Mental Status- Alert. General Appearance- Not in acute distress.   Skin General: Color- Normal Color. Moisture- Normal Moisture.   Chest and Lung Exam Auscultation: Breath Sounds:-Normal, evan and unlabored.  Cardiovascular Auscultation:Rythm- Regular. Murmurs & Other Heart Sounds:Auscultation of the heart reveals- No Murmurs. .  Neurologic Cranial Nerve exam:- CN III-XII intact(No nystagmus), symmetric smile.          Assessment & Plan:

## 2014-04-11 ENCOUNTER — Other Ambulatory Visit: Payer: Self-pay

## 2014-04-11 DIAGNOSIS — Z1231 Encounter for screening mammogram for malignant neoplasm of breast: Secondary | ICD-10-CM

## 2014-04-14 ENCOUNTER — Ambulatory Visit: Payer: Self-pay | Admitting: Podiatry

## 2014-04-16 ENCOUNTER — Encounter: Payer: Self-pay | Admitting: Podiatry

## 2014-04-16 ENCOUNTER — Ambulatory Visit (INDEPENDENT_AMBULATORY_CARE_PROVIDER_SITE_OTHER): Payer: BLUE CROSS/BLUE SHIELD | Admitting: Podiatry

## 2014-04-16 VITALS — BP 159/98 | HR 75 | Resp 16

## 2014-04-16 DIAGNOSIS — M779 Enthesopathy, unspecified: Secondary | ICD-10-CM | POA: Diagnosis not present

## 2014-04-16 NOTE — Progress Notes (Signed)
Subjective:     Patient ID: [name], female   DOB: 1970-08-04, 43 y.o.   MRN: [id number]  HPI patient presents stating that she wants new orthotics as she developed pain in her feet and they are no longer holding her arches properly   Review of Systems     Objective:   Physical Exam Neurovascular status intact muscle strength adequate with range of motion within normal limits. Patient is moderate discomfort plantar both feet and orthotics that of lost her ability to hold her arch properly    Assessment:     Tendinitis secondary to foot    Plan:     Discussed this condition and at this time scanned for new orthotics to give better plantar support and we will re-have the old orthotics when these are returned

## 2014-04-16 NOTE — Progress Notes (Signed)
   Subjective:    Patient ID: [name], female    DOB: [birth year], 44 y.o.   MRN: [id number]  HPI  Pt presents to get a new pair of orthotics  Review of Systems  All other systems reviewed and are negative.      Objective:   Physical Exam        Assessment & Plan:

## 2014-04-24 ENCOUNTER — Telehealth: Payer: Self-pay | Admitting: Family Medicine

## 2014-04-24 NOTE — Telephone Encounter (Signed)
CAlled patient. states her BP has been running in the 130's advised that wouldn't be considered HBP advised to record pressures and keep her 06/29/14 appointment.

## 2014-04-24 NOTE — Telephone Encounter (Signed)
Caller name: Encarnacion Relation to pt: self Call back number: [phone] Pharmacy:  Reason for call:   Patient saw edward at last visit. She states that the blood pressure medication has brought her bp down but sometimes she feels like her bp is still a little high. Has appt with edward on 06/29/14

## 2014-05-05 ENCOUNTER — Ambulatory Visit
Admission: RE | Admit: 2014-05-05 | Discharge: 2014-05-05 | Disposition: A | Discharge status: Home / Self Care | Payer: BLUE CROSS/BLUE SHIELD | Source: Ambulatory Visit

## 2014-05-05 DIAGNOSIS — Z1231 Encounter for screening mammogram for malignant neoplasm of breast: Secondary | ICD-10-CM

## 2014-05-09 ENCOUNTER — Ambulatory Visit: Payer: BLUE CROSS/BLUE SHIELD | Admitting: *Deleted

## 2014-05-09 DIAGNOSIS — M779 Enthesopathy, unspecified: Secondary | ICD-10-CM

## 2014-05-09 NOTE — Patient Instructions (Signed)

## 2014-05-09 NOTE — Progress Notes (Signed)
Patient ID: [name], female   DOB: 11-28-1970, 44 y.o.   MRN: [id number] PICKING UP INSERTS

## 2014-06-26 ENCOUNTER — Ambulatory Visit: Payer: BLUE CROSS/BLUE SHIELD | Admitting: Medical

## 2014-06-30 ENCOUNTER — Ambulatory Visit: Payer: BLUE CROSS/BLUE SHIELD | Admitting: Medical

## 2014-07-24 ENCOUNTER — Other Ambulatory Visit: Payer: Self-pay | Admitting: Medical

## 2014-07-25 ENCOUNTER — Other Ambulatory Visit: Payer: Self-pay

## 2014-07-25 MED ORDER — LISINOPRIL 10 MG PO TABS: 10.0000 mg | ORAL_TABLET | Freq: Every day | ORAL | Status: DC

## 2014-07-25 NOTE — Telephone Encounter (Signed)
[facility] for #30, no refills.  Pt needs f/u [facility] to recheck BP and BMP

## 2014-08-06 ENCOUNTER — Ambulatory Visit: Payer: BLUE CROSS/BLUE SHIELD | Admitting: Family Medicine

## 2014-08-20 ENCOUNTER — Encounter: Payer: Self-pay | Admitting: Family Medicine

## 2014-08-20 ENCOUNTER — Ambulatory Visit (INDEPENDENT_AMBULATORY_CARE_PROVIDER_SITE_OTHER): Payer: BLUE CROSS/BLUE SHIELD | Admitting: Family Medicine

## 2014-08-20 VITALS — BP 130/78 | HR 78 | Temp 98.0°F | Resp 16 | Ht 64.0 in | Wt 214.5 lb

## 2014-08-20 DIAGNOSIS — N62 Hypertrophy of breast: Secondary | ICD-10-CM | POA: Insufficient documentation

## 2014-08-20 DIAGNOSIS — M25511 Pain in right shoulder: Secondary | ICD-10-CM | POA: Diagnosis not present

## 2014-08-20 DIAGNOSIS — I10 Essential (primary) hypertension: Secondary | ICD-10-CM

## 2014-08-20 DIAGNOSIS — M25512 Pain in left shoulder: Secondary | ICD-10-CM

## 2014-08-20 DIAGNOSIS — R002 Palpitations: Secondary | ICD-10-CM | POA: Diagnosis not present

## 2014-08-20 LAB — CBC WITH DIFFERENTIAL/PLATELET
Basophils Absolute: 0 10*3/uL (ref 0.0–0.1)
Basophils Relative: 0.8 % (ref 0.0–3.0)
EOS ABS: 0.2 10*3/uL (ref 0.0–0.7)
Eosinophils Relative: 3.9 % (ref 0.0–5.0)
HCT: 42 % (ref 36.0–46.0)
HEMOGLOBIN: 14.1 g/dL (ref 12.0–15.0)
Lymphocytes Relative: 36.3 % (ref 12.0–46.0)
Lymphs Abs: 2.2 10*3/uL (ref 0.7–4.0)
MCHC: 33.6 g/dL (ref 30.0–36.0)
MCV: 87.7 fl (ref 78.0–100.0)
MONO ABS: 0.4 10*3/uL (ref 0.1–1.0)
Monocytes Relative: 6.9 % (ref 3.0–12.0)
NEUTROS ABS: 3.2 10*3/uL (ref 1.4–7.7)
Neutrophils Relative %: 52.1 % (ref 43.0–77.0)
Platelets: 328 10*3/uL (ref 150.0–400.0)
RBC: 4.8 Mil/uL (ref 3.87–5.11)
RDW: 13.4 % (ref 11.5–15.5)
WBC: 6.2 10*3/uL (ref 4.0–10.5)

## 2014-08-20 LAB — BASIC METABOLIC PANEL
BUN: 11 mg/dL (ref 6–23)
CO2: 26 meq/L (ref 19–32)
Calcium: 9.8 mg/dL (ref 8.4–10.5)
Chloride: 103 mEq/L (ref 96–112)
Creatinine, Ser: 0.56 mg/dL (ref 0.40–1.20)
GFR: 124.06 mL/min (ref >60.00)
GLUCOSE: 81 mg/dL (ref 70–99)
Potassium: 3.9 mEq/L (ref 3.5–5.1)
SODIUM: 138 meq/L (ref 135–145)

## 2014-08-20 LAB — TSH: TSH: 1.25 u[IU]/mL (ref 0.35–4.50)

## 2014-08-20 MED ORDER — LISINOPRIL 10 MG PO TABS: 10.0000 mg | ORAL_TABLET | Freq: Every day | ORAL | Status: DC

## 2014-08-20 NOTE — Assessment & Plan Note (Addendum)
Recurrent issue for pt.  She was particularly symptomatic when attempting to wean her estrogen replacement tx.  sxs greatly improved when she resumed her normal dosing but she continues to have 1-2 episodes/week.  Pt was encouraged to log symptoms to see if there's a pattern.  If more frequent, will arrange a holter monitor.  Check labs to r/o metabolic cause.  No EKG today b/c pt is completely asymptomatic and HR is regular on exam.  Reviewed supportive care and red flags that should prompt return.  Pt expressed understanding and is in agreement w/ plan.

## 2014-08-20 NOTE — Progress Notes (Signed)
Pre visit review using our clinic review tool, if applicable. No additional management support is needed unless otherwise documented below in the visit note. 

## 2014-08-20 NOTE — Assessment & Plan Note (Signed)
Chronic problem.  Adequate control on Lisinopril.  Due for BMP to assess Cr and K+.  No anticipated med changes.

## 2014-08-20 NOTE — Assessment & Plan Note (Signed)
Ongoing issue for pt as well as neck and back pain due to large breasts.  Pt is interested in discussing reduction to minimize her ongoing discomfort.  Referral placed.

## 2014-08-20 NOTE — Progress Notes (Signed)
   Subjective:    Patient ID: [name], female    DOB: 1970-12-03, 44 y.o.   MRN: [id number]  HPI HTN- chronic problem.  Pt was started on Lisinopril by Percell Miller at last visit but did not return for recheck or labs.  At the time pt was also having heart palpitations.  Pt was attempting to wean her estrogen replacement at the time but since going back up on the dose, the palpitations have slowed considerably to 1-2x/week.  Denies CP, SOB, HAs, visual changes, edema.  Shoulder pain- pt was at beach w/ family and others were commenting on the severity of the strap marks made by the bathing suit due to her large breasts.  Pt has cervical spine issues and low back pain.   Review of Systems For ROS see HPI     Objective:   Physical Exam  Constitutional: She is oriented to person, place, and time. She appears well-developed and well-nourished. No distress.  HENT:  Head: Normocephalic and atraumatic.  Eyes: Conjunctivae and EOM are normal. Pupils are equal, round, and reactive to light.  Neck: Normal range of motion. Neck supple. No thyromegaly present.  Cardiovascular: Normal rate, regular rhythm, normal heart sounds and intact distal pulses.   No murmur heard. Pulmonary/Chest: Effort normal and breath sounds normal. No respiratory distress.  Abdominal: Soft. She exhibits no distension. There is no tenderness.  Musculoskeletal: She exhibits no edema.  Lymphadenopathy:    She has no cervical adenopathy.  Neurological: She is alert and oriented to person, place, and time.  Skin: Skin is warm and dry.  Psychiatric: She has a normal mood and affect. Her behavior is normal.  Vitals reviewed.         Assessment & Plan:

## 2014-08-20 NOTE — Patient Instructions (Signed)
Schedule your complete physical at your convenience We'll notify you of your lab results and make any changes if needed If you start having palpitations more regularly- let me know and we'll arrange the holter monitor We'll call you with your plastic surgery appt for discussion on reduction Call with any questions or concerns Have a great summer!!!

## 2014-08-20 NOTE — Assessment & Plan Note (Signed)
Chronic problem for pt and the cause of ongoing back, neck and shoulder pain.  Refer to plastic surgery for discussion on reduction.

## 2014-09-26 ENCOUNTER — Ambulatory Visit: Payer: BLUE CROSS/BLUE SHIELD | Admitting: Medical

## 2014-09-30 ENCOUNTER — Telehealth: Payer: Self-pay | Admitting: Family Medicine

## 2014-09-30 NOTE — Telephone Encounter (Signed)
FYI- Pt on your schedule for 3:30 tomorrow.   Called pt and was informed that rash began last week. Started under her bra and is all over her chest. Pt states that when she looked at her lisinopril the bottle advised that if she develops a rash that she needs to contact provider, pt began lisinopril on 08/20/14. Pt stated that she has been having elevated BP readings at home and will continue her BP meds until the appt tomorrow. Pt states she is still having palpitations that she complained of to PCP on 08/20/14. PCP recommendations at that time were to complete a referral to cardiology for possible holter monitor. Pt would like to proceed if BP is still elevated at appt tomorrow.

## 2014-09-30 NOTE — Telephone Encounter (Signed)
Caller name:Darin [name] Relation to NP:YYFR Call back number:[phone] Pharmacy:  Reason for call: pt would like for you to give her a call, states she has broken out with a rash since taken her meds lisinopril (PRINIVIL,ZESTRIL) 10 MG tablet  and would like to know if she should discontinue the meds.

## 2014-10-01 ENCOUNTER — Encounter: Payer: Self-pay | Admitting: Medical

## 2014-10-01 ENCOUNTER — Telehealth: Payer: Self-pay | Admitting: Family Medicine

## 2014-10-01 ENCOUNTER — Ambulatory Visit (INDEPENDENT_AMBULATORY_CARE_PROVIDER_SITE_OTHER): Payer: BLUE CROSS/BLUE SHIELD | Admitting: Medical

## 2014-10-01 VITALS — BP 124/85 | HR 86 | Temp 98.6°F | Ht 64.0 in | Wt 221.6 lb

## 2014-10-01 DIAGNOSIS — B49 Unspecified mycosis: Secondary | ICD-10-CM | POA: Diagnosis not present

## 2014-10-01 MED ORDER — NYSTATIN 100000 UNIT/GM EX CREA: 1.0000 "application " | TOPICAL_CREAM | Freq: Two times a day (BID) | CUTANEOUS | Status: DC

## 2014-10-01 MED ORDER — FLUCONAZOLE 150 MG PO TABS: ORAL_TABLET | ORAL | Status: DC

## 2014-10-01 NOTE — Telephone Encounter (Signed)
No charge. 

## 2014-10-01 NOTE — Telephone Encounter (Signed)
Pt was no show 09/26/14 2:45pm with Percell Miller, acute appt, pt rescheduled for 10/01/14 and came in, charge for no show?

## 2014-10-01 NOTE — Patient Instructions (Addendum)
Fungal infection very suspicious with location of rash, recent sweating and occlusive clothing your described. Will rx diflucan and nystatin cream. If area expands other than current area would have to reconsider dx.   Follow up in 7 days or as needed.

## 2014-10-01 NOTE — Progress Notes (Signed)
Subjective:    Patient ID: [name], female    DOB: 29-Apr-1970, 44 y.o.   MRN: [id number]  HPI  Pt in with some rash under her breast that began last Tuesday. On bra line and under her breast. Pt thought maybe was lisinopril but she kept taking the medication. No other rash area. Pt states rash on her vacation. Outdoors a lot. Very humid weather. She was sweating a lot when breast rash started.  Pt has been on lisinopril for at least 2-3 months. No sob, no lip swelling. Rash worse under her breast.(not diffuse rash)        Review of Systems  Constitutional: Negative for fever, chills and fatigue.  Respiratory: Negative for cough, choking, chest tightness and wheezing.   Cardiovascular: Negative for chest pain and palpitations.  Musculoskeletal: Negative for back pain.  Skin: Positive for rash.       See hpi.  Neurological: Negative for dizziness, syncope, weakness, numbness and headaches.  Hematological: Negative for adenopathy. Does not bruise/bleed easily.    Past Medical History  Diagnosis Date  . H/O varicella     As an infant.  . Chronic back pain     H/O  . Fibroid     During first pregnancy  . GBS carrier   . Rh negative status during pregnancy   . Irregular uterine bleeding   . Cancer 2002    Left leg  . Monilial vaginitis 07/27/04  . First trimester bleeding 2008  . Constipation in pregnancy 03/2006  . AMA (advanced maternal age) multigravida 17+   . H/O amenorrhea 10/2006  . Acute mastitis of left breast 10/04/2007  . Fibromyalgia   . Endometriosis     h/o  . SAB (spontaneous abortion)     h/o x 2  . Complication of anesthesia     Difficult time waking up  . Heart murmur   . GERD (gastroesophageal reflux disease)   . Headache(784.0)   . Environmental allergies   . Seasonal allergies   . TMJ syndrome   . Celiac disease     History   Social History  . Marital Status: Married    Spouse Name: N/A  . Number of Children: N/A  . Years of  Education: N/A   Occupational History  . Not on file.   Social History Main Topics  . Smoking status: Former Smoker    Types: Cigarettes    Quit date: 02/29/1988  . Smokeless tobacco: Never Used  . Alcohol Use: No  . Drug Use: No  . Sexual Activity: Yes    Birth Control/ Protection: Surgical     Comment: BTL   Other Topics Concern  . Not on file   Social History Narrative    Past Surgical History  Procedure Laterality Date  . Wisdom tooth extraction  1989  . Appendectomy  2002  . Endocervical polyp removal  2001  . Melanoma removal   2003  . Uterine curettage  01/2009  . Endometrial ablation  2010  . Hysteroscopy w/ endometrial ablation  01/28/2009  . Tendon release  2004  . Salpingoophorectomy  12/08/2011    Procedure: SALPINGO OOPHERECTOMY;  Surgeon: Eldred Manges, MD;  Location: [facility];  Service: Gynecology;  Laterality: Bilateral;  . Dilation and evacuation    . Abdominal hysterectomy  2013  . Melanoma excision  2002    Left Leg  . Hernia repair  05/16/12    incisional hernia repair  . Ventral hernia  repair N/A 05/16/2012    Procedure: LAPAROSCOPIC VENTRAL HERNIA;  Surgeon: Joyice Faster. Cornett, MD;  Location: [facility];  Service: General;  Laterality: N/A;  . Insertion of mesh N/A 05/16/2012    Procedure: INSERTION OF MESH;  Surgeon: Joyice Faster. Cornett, MD;  Location: [place];  Service: General;  Laterality: N/A;  . Knee arthroscopy Left 12/05/2012    Procedure: LEFT KNEE ARTHROSCOPY;  Surgeon: Kerin Salen, MD;  Location: [facility];  Service: Orthopedics;  Laterality: Left;    Family History  Problem Relation Age of Onset  . Hypertension Mother   . Cancer Mother     breast  . Heart disease Father     atrial fib  . Hypertension Father   . Cancer Father     Lyphoma,Bladder  . Atrial fibrillation Father   . Cancer Maternal Grandmother     breast  . Heart disease Maternal Grandfather     MI  . Heart disease Paternal Grandfather     MI  .  Cancer Maternal Aunt     ovarian  . Diverticulitis Brother     Allergies  Allergen Reactions  . Betadine [Povidone Iodine] Other (See Comments)    Dx with allergy testing  . Cymbalta [Duloxetine Hcl] Other (See Comments)    Makes her feel crazy, out of body  . Shellfish Allergy Other (See Comments)    Dx with allergy testing    Current Outpatient Prescriptions on File Prior to Visit  Medication Sig Dispense Refill  . Beclomethasone Dipropionate (QNASL) 80 MCG/ACT AERS Place 2 Squirts into the nose daily as needed (allergies). Each nostril    . Calcium Carbonate-Vit D-Min (CALCIUM 1200 PO) Take by mouth.    . cetirizine (ZYRTEC) 10 MG tablet Take 10 mg by mouth daily.    [name] estradiol (VIVELLE-DOT) 0.05 MG/24HR Place 1 patch onto the skin 2 (two) times a week. Monday and Thursday    . lisinopril (PRINIVIL,ZESTRIL) 10 MG tablet Take 1 tablet (10 mg total) by mouth daily. 30 tablet 6  . Multiple Vitamins-Minerals (MULTIVITAMIN ADULT PO) Take by mouth.    . RESTASIS 0.05 % ophthalmic emulsion   1   No current facility-administered medications on file prior to visit.    BP 124/85 mmHg  Pulse 86  Temp(Src) 98.6 F (37 C) (Oral)  Ht 5\' 4"  (1.626 m)  Wt 221 lb 9.6 oz (100.517 kg)  BMI 38.02 kg/m2  SpO2 96%  LMP 11/19/2010       Objective:   Physical Exam  General- No acute distress. Pleasant patient. Neck- Full range of motion, no jvd Lungs- Clear, even and unlabored. Heart- regular rate and rhythm. Neurologic- CNII- XII grossly intact.  Skin- bilateral rash moderate bright red under both breast. Some faint rash top of breast. Otherwise skin looks clear.       Assessment & Plan:  Fungal infection very suspicious with location of rash, recent sweating and occlusive clothing your described. Will rx diflucan and nystatin cream. If area expands other than current area would have to reconsider dx.   Follow up in 7 days or as needed.

## 2014-10-15 ENCOUNTER — Encounter: Payer: Self-pay | Admitting: Podiatry

## 2014-10-15 ENCOUNTER — Ambulatory Visit (INDEPENDENT_AMBULATORY_CARE_PROVIDER_SITE_OTHER): Payer: BLUE CROSS/BLUE SHIELD | Admitting: Podiatry

## 2014-10-15 VITALS — BP 118/75 | HR 65 | Resp 16

## 2014-10-15 DIAGNOSIS — M779 Enthesopathy, unspecified: Secondary | ICD-10-CM

## 2014-10-15 NOTE — Progress Notes (Signed)
Subjective:     Patient ID: [name], female   DOB: 02-20-1971, 44 y.o.   MRN: [id number]  HPI patient states orthotic not comfortable   Review of Systems     Objective:   Physical Exam  orthotic does not fit her well    Assessment:      reviewed her orthotic usage    Plan:      we will try to change the orthotic to make it more usable for her

## 2014-11-18 ENCOUNTER — Ambulatory Visit: Payer: BLUE CROSS/BLUE SHIELD | Admitting: Family Medicine

## 2014-11-19 ENCOUNTER — Ambulatory Visit (INDEPENDENT_AMBULATORY_CARE_PROVIDER_SITE_OTHER): Payer: BLUE CROSS/BLUE SHIELD | Admitting: Family Medicine

## 2014-11-19 ENCOUNTER — Encounter: Payer: Self-pay | Admitting: Family Medicine

## 2014-11-19 VITALS — BP 122/80 | HR 72 | Temp 98.3°F | Resp 17 | Wt 226.5 lb

## 2014-11-19 DIAGNOSIS — M546 Pain in thoracic spine: Secondary | ICD-10-CM

## 2014-11-19 DIAGNOSIS — R51 Headache: Secondary | ICD-10-CM | POA: Diagnosis not present

## 2014-11-19 DIAGNOSIS — E669 Obesity, unspecified: Secondary | ICD-10-CM | POA: Insufficient documentation

## 2014-11-19 DIAGNOSIS — R519 Headache, unspecified: Secondary | ICD-10-CM

## 2014-11-19 MED ORDER — MONTELUKAST SODIUM 10 MG PO TABS: 10.0000 mg | ORAL_TABLET | Freq: Every day | ORAL | Status: DC

## 2014-11-19 NOTE — Patient Instructions (Signed)
Follow up as scheduled Continue the nasal spray and the Zyrtec ADD the Singulair once daily Drink plenty of fluids Let me know when you want to start PT for the back and neck pain CONGRATS on deciding to join [employer]!  You can do this! Call with any questions or concerns Hang in there!!!

## 2014-11-19 NOTE — Progress Notes (Signed)
Pre visit review using our clinic review tool, if applicable. No additional management support is needed unless otherwise documented below in the visit note. 

## 2014-11-19 NOTE — Progress Notes (Signed)
   Subjective:    Patient ID: [name], female    DOB: [birth year], 44 y.o.   MRN: [id number]  HPI HA- 'nagging HA'.  Pt recalls this occuring multiple times around this time of year.  Took sudafed this AM w/ improvement.  HA responds to ibuprofen but will return as meds wear off in 4-6 hrs.  HA is bitemporal.  Mild nausea yesterday AM, no vomiting.  + sensitivity to sound yesterday, no photophobia.  No fevers.  No tooth pain.  + PND, near constant throat clearing.  Back pain- pt is still interested in pursuing breast reduction surgery. Her insurance requires 6 weeks of PT and physician supervised weight loss attempts.  Pt has joined [employer].  Pt has gained 12 lbs since last visit which is what prompted her to join [employer].  We will follow along w/ her ongoing progress.   Review of Systems For ROS see HPI     Objective:   Physical Exam  Constitutional: She is oriented to person, place, and time. She appears well-developed and well-nourished. No distress.  obese  HENT:  Head: Normocephalic and atraumatic.  Right Ear: Tympanic membrane normal.  Left Ear: Tympanic membrane normal.  Nose: Mucosal edema and rhinorrhea present. Right sinus exhibits no maxillary sinus tenderness and no frontal sinus tenderness. Left sinus exhibits no maxillary sinus tenderness and no frontal sinus tenderness.  Mouth/Throat: Mucous membranes are normal. Posterior oropharyngeal erythema (w/ PND) present.  Eyes: Conjunctivae and EOM are normal. Pupils are equal, round, and reactive to light.  Neck: Normal range of motion. Neck supple.  Cardiovascular: Normal rate, regular rhythm and normal heart sounds.   Pulmonary/Chest: Effort normal and breath sounds normal. No respiratory distress. She has no wheezes. She has no rales.  Very large breasts  Lymphadenopathy:    She has no cervical adenopathy.  Neurological: She is alert and oriented to person, place, and time.  Skin: Skin is warm and dry.   Psychiatric: She has a normal mood and affect. Her behavior is normal. Thought content normal.  Vitals reviewed.         Assessment & Plan:

## 2014-11-20 NOTE — Assessment & Plan Note (Signed)
Recurrent problem for pt.  Today's HA seems due to sinus pressure and under-treated seasonal allergies.  No evidence of infxn.  Some migrainous features but not consistent and pt w/o hx.  Add Singulair to pt's daily antihistamine and nasal spray.  Reviewed supportive care and red flags that should prompt return.  Pt expressed understanding and is in agreement w/ plan.

## 2014-11-20 NOTE — Assessment & Plan Note (Signed)
Chronic problem for pt due to very large breasts and poor posture.  According to pt, insurance requires formal PT prior to approval for breast reduction.  Offered referral but pt is not ready at this time.

## 2014-11-20 NOTE — Assessment & Plan Note (Signed)
Ongoing issue for pt.  After her recent weight gain, pt felt the need to join [employer].  I applauded this decision.  Discussed the need for addition of regular physical activity, 3-4 days/week for at least 30 minutes.  Will continue to follow closely along w/ pt

## 2014-11-26 ENCOUNTER — Encounter: Payer: Self-pay | Admitting: *Deleted

## 2014-11-26 DIAGNOSIS — M779 Enthesopathy, unspecified: Secondary | ICD-10-CM

## 2014-11-26 NOTE — Progress Notes (Signed)
Patient ID: [name], female   DOB: February 06, 1971, 44 y.o.   MRN: [id number] Patient presents for fitting of adjusted orthotics.  Patient states she can still feel the arches but not as much as before she wants to wear them for a bit before deciding if they need further adjustment.  Patient will contact office if she deems further adjustment necessary.

## 2015-01-28 ENCOUNTER — Ambulatory Visit (INDEPENDENT_AMBULATORY_CARE_PROVIDER_SITE_OTHER): Payer: BLUE CROSS/BLUE SHIELD | Admitting: Family Medicine

## 2015-01-28 ENCOUNTER — Encounter: Payer: Self-pay | Admitting: Family Medicine

## 2015-01-28 VITALS — BP 120/80 | HR 71 | Temp 98.0°F | Resp 16 | Ht 64.0 in | Wt 219.0 lb

## 2015-01-28 DIAGNOSIS — Z0000 Encounter for general adult medical examination without abnormal findings: Secondary | ICD-10-CM

## 2015-01-28 LAB — BASIC METABOLIC PANEL
BUN: 11 mg/dL (ref 6–23)
CALCIUM: 9.4 mg/dL (ref 8.4–10.5)
CO2: 28 mEq/L (ref 19–32)
CREATININE: 0.54 mg/dL (ref 0.40–1.20)
Chloride: 106 mEq/L (ref 96–112)
GFR: 129.13 mL/min (ref >60.00)
Glucose, Bld: 95 mg/dL (ref 70–99)
Potassium: 4.6 mEq/L (ref 3.5–5.1)
SODIUM: 140 meq/L (ref 135–145)

## 2015-01-28 LAB — CBC WITH DIFFERENTIAL/PLATELET
BASOS PCT: 0.4 % (ref 0.0–3.0)
Basophils Absolute: 0 10*3/uL (ref 0.0–0.1)
Eosinophils Absolute: 0.4 10*3/uL (ref 0.0–0.7)
Eosinophils Relative: 6 % — ABNORMAL HIGH (ref 0.0–5.0)
HCT: 42.1 % (ref 36.0–46.0)
HEMOGLOBIN: 14.2 g/dL (ref 12.0–15.0)
LYMPHS PCT: 32.4 % (ref 12.0–46.0)
Lymphs Abs: 2.3 10*3/uL (ref 0.7–4.0)
MCHC: 33.7 g/dL (ref 30.0–36.0)
MCV: 87.2 fl (ref 78.0–100.0)
MONOS PCT: 6.9 % (ref 3.0–12.0)
Monocytes Absolute: 0.5 10*3/uL (ref 0.1–1.0)
NEUTROS ABS: 3.8 10*3/uL (ref 1.4–7.7)
Neutrophils Relative %: 54.3 % (ref 43.0–77.0)
PLATELETS: 335 10*3/uL (ref 150.0–400.0)
RBC: 4.83 Mil/uL (ref 3.87–5.11)
RDW: 14 % (ref 11.5–15.5)
WBC: 7 10*3/uL (ref 4.0–10.5)

## 2015-01-28 LAB — HEPATIC FUNCTION PANEL
ALT: 27 U/L (ref 0–35)
AST: 17 U/L (ref 0–37)
Albumin: 4.1 g/dL (ref 3.5–5.2)
Alkaline Phosphatase: 55 U/L (ref 39–117)
BILIRUBIN DIRECT: 0.1 mg/dL (ref 0.0–0.3)
Total Bilirubin: 0.5 mg/dL (ref 0.2–1.2)
Total Protein: 6.9 g/dL (ref 6.0–8.3)

## 2015-01-28 LAB — LIPID PANEL
CHOL/HDL RATIO: 4
Cholesterol: 171 mg/dL (ref 0–200)
HDL: 45.1 mg/dL (ref >39.00)
LDL Cholesterol: 106 mg/dL — ABNORMAL HIGH (ref 0–99)
NONHDL: 125.74
Triglycerides: 98 mg/dL (ref 0.0–149.0)
VLDL: 19.6 mg/dL (ref 0.0–40.0)

## 2015-01-28 LAB — VITAMIN D 25 HYDROXY (VIT D DEFICIENCY, FRACTURES): VITD: 14.97 ng/mL — ABNORMAL LOW (ref 30.00–100.00)

## 2015-01-28 LAB — TSH: TSH: 1.15 u[IU]/mL (ref 0.35–4.50)

## 2015-01-28 NOTE — Progress Notes (Signed)
Pre visit review using our clinic review tool, if applicable. No additional management support is needed unless otherwise documented below in the visit note. 

## 2015-01-28 NOTE — Progress Notes (Signed)
   Subjective:    Patient ID: [name], female    DOB: Apr 20, 1970, 44 y.o.   MRN: [id number]  HPI CPE- UTD on GYN.  Pt has lost 8 lbs since last visit.  Declines flu shot.   Review of Systems Patient reports no vision/ hearing changes, adenopathy,fever, weight change,  persistant/recurrent hoarseness , swallowing issues, chest pain, palpitations, edema, persistant/recurrent cough, hemoptysis, dyspnea (rest/exertional/paroxysmal nocturnal), gastrointestinal bleeding (melena, rectal bleeding), abdominal pain, significant heartburn, bowel changes, GU symptoms (dysuria, hematuria, incontinence), Gyn symptoms (abnormal  bleeding, pain),  syncope, focal weakness, memory loss, numbness & tingling, skin/hair/nail changes, abnormal bruising or bleeding, anxiety, or depression.     Objective:   Physical Exam General Appearance:    Alert, cooperative, no distress, appears stated age, obese  Head:    Normocephalic, without obvious abnormality, atraumatic  Eyes:    PERRL, conjunctiva/corneas clear, EOM's intact, fundi    benign, both eyes  Ears:    Normal TM's and external ear canals, both ears  Nose:   Nares normal, septum midline, mucosa normal, no drainage    or sinus tenderness  Throat:   Lips, mucosa, and tongue normal; teeth and gums normal  Neck:   Supple, symmetrical, trachea midline, no adenopathy;    Thyroid: no enlargement/tenderness/nodules  Back:     Symmetric, no curvature, ROM normal, no CVA tenderness  Lungs:     Clear to auscultation bilaterally, respirations unlabored  Chest Wall:    No tenderness or deformity   Heart:    Regular rate and rhythm, S1 and S2 normal, no murmur, rub   or gallop  Breast Exam:    Deferred to GYN  Abdomen:     Soft, non-tender, bowel sounds active all four quadrants,    no masses, no organomegaly  Genitalia:    Deferred to GYN  Rectal:    Extremities:   Extremities normal, atraumatic, no cyanosis or edema  Pulses:   2+ and symmetric all  extremities  Skin:   Skin color, texture, turgor normal, no rashes or lesions  Lymph nodes:   Cervical, supraclavicular, and axillary nodes normal  Neurologic:   CNII-XII intact, normal strength, sensation and reflexes    throughout          Assessment & Plan:

## 2015-01-28 NOTE — Patient Instructions (Signed)
Follow up in 6 months to recheck BP We'll notify you of your lab results and make any changes if needed Continue to work on healthy diet and regular exercise- you're doing it!!! Call with any questions or concerns If you want to join [place] at the new [place] office, any scheduled appointments will automatically transfer and we will see you at [address] [place] [address] [place], [place] [address] ([place] 03/03/15) Happy Holidays!!!

## 2015-01-28 NOTE — Assessment & Plan Note (Signed)
Pt's PE WNL w/ exception of obesity.  UTD on GYN.  Pt declines flu shot.  Check labs.  Anticipatory guidance provided.

## 2015-01-29 ENCOUNTER — Other Ambulatory Visit: Payer: Self-pay | Admitting: General Practice

## 2015-01-29 MED ORDER — VITAMIN D (ERGOCALCIFEROL) 1.25 MG (50000 UNIT) PO CAPS: 50000.0000 [IU] | ORAL_CAPSULE | ORAL | Status: DC

## 2015-03-25 ENCOUNTER — Encounter: Payer: Self-pay | Admitting: Medical

## 2015-03-25 ENCOUNTER — Ambulatory Visit (INDEPENDENT_AMBULATORY_CARE_PROVIDER_SITE_OTHER): Payer: BLUE CROSS/BLUE SHIELD | Admitting: Medical

## 2015-03-25 VITALS — BP 118/80 | HR 81 | Temp 98.3°F | Resp 16 | Ht 64.0 in | Wt 219.4 lb

## 2015-03-25 DIAGNOSIS — R509 Fever, unspecified: Secondary | ICD-10-CM | POA: Diagnosis not present

## 2015-03-25 DIAGNOSIS — B349 Viral infection, unspecified: Secondary | ICD-10-CM

## 2015-03-25 DIAGNOSIS — R062 Wheezing: Secondary | ICD-10-CM | POA: Diagnosis not present

## 2015-03-25 DIAGNOSIS — R05 Cough: Secondary | ICD-10-CM | POA: Diagnosis not present

## 2015-03-25 DIAGNOSIS — R059 Cough, unspecified: Secondary | ICD-10-CM

## 2015-03-25 MED ORDER — ALBUTEROL SULFATE HFA 108 (90 BASE) MCG/ACT IN AERS: 2.0000 | INHALATION_SPRAY | Freq: Four times a day (QID) | RESPIRATORY_TRACT | Status: DC | PRN

## 2015-03-25 MED ORDER — AZITHROMYCIN 250 MG PO TABS: ORAL_TABLET | ORAL | Status: DC

## 2015-03-25 MED ORDER — HYDROCODONE-HOMATROPINE 5-1.5 MG/5ML PO SYRP: 5.0000 mL | ORAL_SOLUTION | Freq: Three times a day (TID) | ORAL | Status: DC | PRN

## 2015-03-25 NOTE — Progress Notes (Signed)
Pre visit review using our clinic review tool, if applicable. No additional management support is needed unless otherwise documented below in the visit note. 

## 2015-03-25 NOTE — Progress Notes (Signed)
   Subjective:    Patient ID: [name], female    DOB: December 18, 1970, 45 y.o.   MRN: [id number]  HPI   Pt in stating this weekend diffuse body aches, fever, ha and no energy at all. Pt daugher had flu diagnosed + test on Friday. Other daughter has flu like illness.  Pt has some rattle nose to chest and wheezing. Pt does not have history of asthma. She does have history of allergic rhinitis.  Pt states most of her symptoms have felt better. But still feels some tired. Low grade fever. Some cough. But not bringing up mucous.     Review of Systems  Constitutional: Positive for fever. Negative for chills and fatigue.  HENT: Negative for congestion, ear discharge, sinus pressure, sneezing and sore throat.   Respiratory: Positive for cough and wheezing. Negative for chest tightness and shortness of breath.   Cardiovascular: Negative for chest pain and palpitations.  Skin: Negative for rash.  Neurological: Negative for dizziness, speech difficulty, weakness, numbness and headaches.  Hematological: Negative for adenopathy. Does not bruise/bleed easily.  Psychiatric/Behavioral: Negative for behavioral problems and confusion.       Objective:   Physical Exam  General  Mental Status - Alert. General Appearance - Well groomed. Not in acute distress.  Skin Rashes- No Rashes.  HEENT Head- Normal. Ear Auditory Canal - Left- Normal. Right - Normal.Tympanic Membrane- Left- Normal. Right- Normal. Eye Sclera/Conjunctiva- Left- Normal. Right- Normal. Nose & Sinuses Nasal Mucosa- Left-  Not boggy or Congested. Right-  Not  boggy or Congested. Mouth & Throat Lips: Upper Lip- Normal: no dryness, cracking, pallor, cyanosis, or vesicular eruption. Lower Lip-Normal: no dryness, cracking, pallor, cyanosis or vesicular eruption. Buccal Mucosa- Bilateral- No Aphthous ulcers. Oropharynx- No Discharge or Erythema. Tonsils: Characteristics- Bilateral- No Erythema or Congestion. Size/Enlargement-  Bilateral- No enlargement. Discharge- bilateral-None.  Neck Neck- Supple. No Masses.   Chest and Lung Exam Auscultation: Breath Sounds:- even and unlabored, but faint lt lower lobe rough breath sounds.   Cardiovascular Auscultation:Rythm- Regular, rate and rhythm. Murmurs & Other Heart Sounds:Ausculatation of the heart reveal- No Murmurs.  Lymphatic Head & Neck General Head & Neck Lymphatics: Bilateral: Description- No Localized lymphadenopathy.     Assessment & Plan:  Viral syndrome. Likely the flu. But past 48 hours onset. Tamiflu would not likely be helpful at this point.  Secondary bronchitis vs possible pneumonia.  Prescription for azithromycin antibiotic.  For cough hycodan syrup.  For wheezing will prescribe albuterol  If symptoms persist by Friday am then get cxr and cbc  Follow up in 7 days or as needed

## 2015-03-25 NOTE — Patient Instructions (Addendum)
Viral syndrome. Likely the flu. But past 48 hours  onset. Tamiflu would not likely be helpful at this point.  Secondary bronchitis vs possible pneumonia.  Prescription for azithromycin antibiotic.  For cough hycodan syrup.  For wheezing will prescribe albuterol  If symptoms persist by Friday am then get cxr and cbc  Follow up in 7 days or as needed

## 2015-04-02 ENCOUNTER — Ambulatory Visit (INDEPENDENT_AMBULATORY_CARE_PROVIDER_SITE_OTHER): Payer: BLUE CROSS/BLUE SHIELD | Admitting: Family Medicine

## 2015-04-02 ENCOUNTER — Ambulatory Visit (HOSPITAL_BASED_OUTPATIENT_CLINIC_OR_DEPARTMENT_OTHER)
Admission: RE | Admit: 2015-04-02 | Discharge: 2015-04-02 | Disposition: A | Discharge status: Home / Self Care | Payer: BLUE CROSS/BLUE SHIELD | Source: Ambulatory Visit | Attending: Family Medicine | Admitting: Family Medicine

## 2015-04-02 ENCOUNTER — Encounter: Payer: Self-pay | Admitting: Family Medicine

## 2015-04-02 VITALS — BP 122/80 | HR 82 | Temp 98.1°F | Ht 64.0 in | Wt 221.4 lb

## 2015-04-02 DIAGNOSIS — R0989 Other specified symptoms and signs involving the circulatory and respiratory systems: Secondary | ICD-10-CM | POA: Diagnosis not present

## 2015-04-02 DIAGNOSIS — R062 Wheezing: Secondary | ICD-10-CM | POA: Diagnosis not present

## 2015-04-02 DIAGNOSIS — R05 Cough: Secondary | ICD-10-CM | POA: Insufficient documentation

## 2015-04-02 MED ORDER — PREDNISONE 10 MG PO TABS: ORAL_TABLET | ORAL | Status: DC

## 2015-04-02 NOTE — Patient Instructions (Signed)
Follow up as needed Go downstairs and get your chest xray We'll determine the next steps based on xray results- steroids vs antibiotics Continue to use the albuterol inhaler- 2 puffs every 4-6 hrs as needed Mucinex DM for cough and congestion REST!!! Call with any questions or concerns Hang in there!!!

## 2015-04-02 NOTE — Assessment & Plan Note (Signed)
New.  Pt's wheezing improved s/p duoneb in office.  CXR was done to determine if she has PNA after the flu- this was negative.  So rather than abx, will start pred taper to improve airway constriction.  Continue albuterol prn.  Cough meds as needed.  Reviewed supportive care and red flags that should prompt return.  Pt expressed understanding and is in agreement w/ plan.

## 2015-04-02 NOTE — Progress Notes (Signed)
   Subjective:    Patient ID: [name], female    DOB: 12-10-1970, 45 y.o.   MRN: [id number]  HPI Wheezing- pt reports having the flu 2 weeks ago.  Just prior to illness felt like she had expiratory wheezes.  Wheezing worsened w/ the flu- 'i could hear bubbles popping' when lying down.  Saw Edward last week and was tx'd w/ Zpack and albuterol and codeine cough syrup (didn't fill)- taking Mucinex DM instead.  Albuterol does not improve wheezing.  Completed Zpack.  sxs are 'better but not gone'.  Denies nasal congestion, PND.  Pt w/ extensive hx of seasonal allergies   Review of Systems For ROS see HPI     Objective:   Physical Exam  Constitutional: She is oriented to person, place, and time. She appears well-developed and well-nourished. No distress.  HENT:  Head: Normocephalic and atraumatic.  TMs normal bilaterally Mild nasal congestion Throat w/out erythema, edema, or exudate  Eyes: Conjunctivae and EOM are normal. Pupils are equal, round, and reactive to light.  Neck: Normal range of motion. Neck supple.  Cardiovascular: Normal rate, regular rhythm, normal heart sounds and intact distal pulses.   No murmur heard. Pulmonary/Chest: Effort normal. No respiratory distress. She has wheezes (scattered expiratory wheezes- improved s/p neb tx).  + hacking cough  Lymphadenopathy:    She has no cervical adenopathy.  Neurological: She is alert and oriented to person, place, and time.  Skin: Skin is warm and dry.  Psychiatric: She has a normal mood and affect. Her behavior is normal. Thought content normal.  Vitals reviewed.         Assessment & Plan:

## 2015-04-02 NOTE — Progress Notes (Signed)
Pre visit review using our clinic review tool, if applicable. No additional management support is needed unless otherwise documented below in the visit note. 

## 2015-04-03 MED ORDER — IPRATROPIUM-ALBUTEROL 0.5-2.5 (3) MG/3ML IN SOLN: 3.0000 mL | Freq: Four times a day (QID) | RESPIRATORY_TRACT | Status: DC

## 2015-04-03 NOTE — Addendum Note (Signed)
Addended by: Bunnie Domino on: 04/03/2015 03:27 PM   Modules accepted: Orders

## 2015-04-06 ENCOUNTER — Other Ambulatory Visit: Payer: Self-pay

## 2015-04-06 DIAGNOSIS — Z1231 Encounter for screening mammogram for malignant neoplasm of breast: Secondary | ICD-10-CM

## 2015-05-07 ENCOUNTER — Ambulatory Visit
Admission: RE | Admit: 2015-05-07 | Discharge: 2015-05-07 | Disposition: A | Discharge status: Home / Self Care | Payer: BLUE CROSS/BLUE SHIELD | Source: Ambulatory Visit

## 2015-05-07 DIAGNOSIS — Z1231 Encounter for screening mammogram for malignant neoplasm of breast: Secondary | ICD-10-CM

## 2015-05-12 ENCOUNTER — Other Ambulatory Visit: Payer: Self-pay | Admitting: General Practice

## 2015-05-12 MED ORDER — MONTELUKAST SODIUM 10 MG PO TABS: 10.0000 mg | ORAL_TABLET | Freq: Every day | ORAL | Status: DC

## 2015-05-12 MED ORDER — LISINOPRIL 10 MG PO TABS: 10.0000 mg | ORAL_TABLET | Freq: Every day | ORAL | Status: DC

## 2015-06-16 LAB — HM PAP SMEAR: HM PAP: NORMAL

## 2015-08-06 ENCOUNTER — Encounter: Payer: Self-pay | Admitting: Behavioral Health

## 2015-08-06 ENCOUNTER — Telehealth: Payer: Self-pay | Admitting: Behavioral Health

## 2015-08-06 NOTE — Telephone Encounter (Signed)
Pre-Visit Call completed with patient and chart updated.   Pre-Visit Info documented in Specialty Comments under [employer].    

## 2015-08-07 ENCOUNTER — Encounter: Payer: Self-pay | Admitting: Family Medicine

## 2015-08-07 ENCOUNTER — Ambulatory Visit (INDEPENDENT_AMBULATORY_CARE_PROVIDER_SITE_OTHER): Payer: BLUE CROSS/BLUE SHIELD | Admitting: Family Medicine

## 2015-08-07 VITALS — BP 102/70 | HR 86 | Temp 98.0°F | Ht 64.0 in | Wt 223.5 lb

## 2015-08-07 DIAGNOSIS — K219 Gastro-esophageal reflux disease without esophagitis: Secondary | ICD-10-CM

## 2015-08-07 DIAGNOSIS — E669 Obesity, unspecified: Secondary | ICD-10-CM

## 2015-08-07 DIAGNOSIS — M25519 Pain in unspecified shoulder: Secondary | ICD-10-CM | POA: Diagnosis not present

## 2015-08-07 DIAGNOSIS — I10 Essential (primary) hypertension: Secondary | ICD-10-CM

## 2015-08-07 DIAGNOSIS — E559 Vitamin D deficiency, unspecified: Secondary | ICD-10-CM

## 2015-08-07 DIAGNOSIS — C439 Malignant melanoma of skin, unspecified: Secondary | ICD-10-CM | POA: Diagnosis not present

## 2015-08-07 DIAGNOSIS — M79629 Pain in unspecified upper arm: Secondary | ICD-10-CM

## 2015-08-07 MED ORDER — LISINOPRIL 5 MG PO TABS: 5.0000 mg | ORAL_TABLET | Freq: Two times a day (BID) | ORAL | Status: DC

## 2015-08-07 NOTE — Assessment & Plan Note (Signed)
Well controlled, no changes to meds. Encouraged heart healthy diet such as the DASH diet and exercise as tolerated.  °

## 2015-08-07 NOTE — Assessment & Plan Note (Signed)
Follows with dermatology annually

## 2015-08-07 NOTE — Patient Instructions (Addendum)
NOW company 10 strain probiotic 1 cap daily  DASH Eating Plan DASH stands for "Dietary Approaches to Stop Hypertension." The DASH eating plan is a healthy eating plan that has been shown to reduce high blood pressure (hypertension). Additional health benefits may include reducing the risk of type 2 diabetes mellitus, heart disease, and stroke. The DASH eating plan may also help with weight loss. WHAT DO I NEED TO KNOW ABOUT THE DASH EATING PLAN? For the DASH eating plan, you will follow these general guidelines:  Choose foods with a percent daily value for sodium of less than 5% (as listed on the food label).  Use salt-free seasonings or herbs instead of table salt or sea salt.  Check with your health care provider or pharmacist before using salt substitutes.  Eat lower-sodium products, often labeled as "lower sodium" or "no salt added."  Eat fresh foods.  Eat more vegetables, fruits, and low-fat dairy products.  Choose whole grains. Look for the word "whole" as the first word in the ingredient list.  Choose fish and skinless chicken or [place] more often than red meat. Limit fish, poultry, and meat to 6 oz (170 g) each day.  Limit sweets, desserts, sugars, and sugary drinks.  Choose heart-healthy fats.  Limit cheese to 1 oz (28 g) per day.  Eat more home-cooked food and less restaurant, buffet, and fast food.  Limit fried foods.  Cook foods using methods other than frying.  Limit canned vegetables. If you do use them, rinse them well to decrease the sodium.  When eating at a restaurant, ask that your food be prepared with less salt, or no salt if possible. WHAT FOODS CAN I EAT? Seek help from a dietitian for individual calorie needs. Grains Whole grain or whole wheat bread. Brown rice. Whole grain or whole wheat pasta. Quinoa, bulgur, and whole grain cereals. Low-sodium cereals. Corn or whole wheat flour tortillas. Whole grain cornbread. Whole grain crackers. Low-sodium  crackers. Vegetables Fresh or frozen vegetables (raw, steamed, roasted, or grilled). Low-sodium or reduced-sodium tomato and vegetable juices. Low-sodium or reduced-sodium tomato sauce and paste. Low-sodium or reduced-sodium canned vegetables.  Fruits All fresh, canned (in natural juice), or frozen fruits. Meat and Other Protein Products Ground beef (85% or leaner), grass-fed beef, or beef trimmed of fat. Skinless chicken or [place]. Ground chicken or [place]. Pork trimmed of fat. All fish and seafood. Eggs. Dried beans, peas, or lentils. Unsalted nuts and seeds. Unsalted canned beans. Dairy Low-fat dairy products, such as skim or 1% milk, 2% or reduced-fat cheeses, low-fat ricotta or cottage cheese, or plain low-fat yogurt. Low-sodium or reduced-sodium cheeses. Fats and Oils Tub margarines without trans fats. Light or reduced-fat mayonnaise and salad dressings (reduced sodium). Avocado. Safflower, olive, or canola oils. Natural peanut or almond butter. Other Unsalted popcorn and pretzels. The items listed above may not be a complete list of recommended foods or beverages. Contact your dietitian for more options. WHAT FOODS ARE NOT RECOMMENDED? Grains White bread. White pasta. White rice. Refined cornbread. Bagels and croissants. Crackers that contain trans fat. Vegetables Creamed or fried vegetables. Vegetables in a cheese sauce. Regular canned vegetables. Regular canned tomato sauce and paste. Regular tomato and vegetable juices. Fruits Dried fruits. Canned fruit in light or heavy syrup. Fruit juice. Meat and Other Protein Products Fatty cuts of meat. Ribs, chicken wings, bacon, sausage, bologna, salami, chitterlings, fatback, hot dogs, bratwurst, and packaged luncheon meats. Salted nuts and seeds. Canned beans with salt. Dairy Whole or 2% milk, cream,  half-and-half, and cream cheese. Whole-fat or sweetened yogurt. Full-fat cheeses or blue cheese. Nondairy creamers and whipped toppings.  Processed cheese, cheese spreads, or cheese curds. Condiments Onion and garlic salt, seasoned salt, table salt, and sea salt. Canned and packaged gravies. Worcestershire sauce. Tartar sauce. Barbecue sauce. Teriyaki sauce. Soy sauce, including reduced sodium. Steak sauce. Fish sauce. Oyster sauce. Cocktail sauce. Horseradish. Ketchup and mustard. Meat flavorings and tenderizers. Bouillon cubes. Hot sauce. Tabasco sauce. Marinades. Taco seasonings. Relishes. Fats and Oils Butter, stick margarine, lard, shortening, ghee, and bacon fat. Coconut, palm kernel, or palm oils. Regular salad dressings. Other Pickles and olives. Salted popcorn and pretzels. The items listed above may not be a complete list of foods and beverages to avoid. Contact your dietitian for more information. WHERE CAN I FIND MORE INFORMATION? National Heart, Lung, and Blood Institute: travelstabloid.com   This information is not intended to replace advice given to you by your health care provider. Make sure you discuss any questions you have with your health care provider.   Document Released: 02/03/2011 Document Revised: 03/07/2014 Document Reviewed: 12/19/2012 [name] [employer].

## 2015-08-16 ENCOUNTER — Encounter: Payer: Self-pay | Admitting: Family Medicine

## 2015-08-16 DIAGNOSIS — E559 Vitamin D deficiency, unspecified: Secondary | ICD-10-CM

## 2015-08-16 HISTORY — DX: Vitamin D deficiency, unspecified: E55.9

## 2015-08-16 NOTE — Assessment & Plan Note (Signed)
Avoid offending foods, start probiotics. Do not eat large meals in late evening and consider raising head of bed.  

## 2015-08-16 NOTE — Progress Notes (Signed)
Patient ID: [name], female   DOB: Aug 27, 1970, 45 y.o.   MRN: [id number]   Subjective:    Patient ID: [name], female    DOB: 1970-11-10, 45 y.o.   MRN: [id number]  Chief Complaint  Patient presents with  . Follow-up    HPI Patient is in today for follow up. No recent illness or acute concerns. No recent illness or acute concerns. Was having headaches but this has improved. Follows with Kendall Flack for GYN care. Denies CP/palp/SOB/HA/congestion/fevers/GI or GU c/o. Taking meds as prescribed  Past Medical History  Diagnosis Date  . H/O varicella     As an infant.  . Chronic back pain     H/O  . Fibroid     During first pregnancy  . GBS carrier   . Rh negative status during pregnancy   . Irregular uterine bleeding   . Cancer ([place]) 2002    Left leg  . Monilial vaginitis 07/27/04  . First trimester bleeding 2008  . Constipation in pregnancy 03/2006  . AMA (advanced maternal age) multigravida 66+   . H/O amenorrhea 10/2006  . Acute mastitis of left breast 10/04/2007  . Fibromyalgia   . Endometriosis     h/o  . SAB (spontaneous abortion)     h/o x 2  . Complication of anesthesia     Difficult time waking up  . Heart murmur   . GERD (gastroesophageal reflux disease)   . Headache(784.0)   . Environmental allergies   . Seasonal allergies   . TMJ syndrome   . Celiac disease   . Vitamin D deficiency 08/16/2015    Past Surgical History  Procedure Laterality Date  . Wisdom tooth extraction  1989  . Appendectomy  2002  . Endocervical polyp removal  2001  . Melanoma removal   2003  . Uterine curettage  01/2009  . Endometrial ablation  2010  . Hysteroscopy w/ endometrial ablation  01/28/2009  . Tendon release  2004  . Salpingoophorectomy  12/08/2011    Procedure: SALPINGO OOPHERECTOMY;  Surgeon: Eldred Manges, MD;  Location: [facility];  Service: Gynecology;  Laterality: Bilateral;  . Dilation and evacuation    . Abdominal hysterectomy  2013  . Melanoma  excision  2002    Left Leg  . Hernia repair  05/16/12    incisional hernia repair  . Ventral hernia repair N/A 05/16/2012    Procedure: LAPAROSCOPIC VENTRAL HERNIA;  Surgeon: Joyice Faster. Cornett, MD;  Location: [facility];  Service: General;  Laterality: N/A;  . Insertion of mesh N/A 05/16/2012    Procedure: INSERTION OF MESH;  Surgeon: Joyice Faster. Cornett, MD;  Location: [facility];  Service: General;  Laterality: N/A;  . Knee arthroscopy Left 12/05/2012    Procedure: LEFT KNEE ARTHROSCOPY;  Surgeon: Kerin Salen, MD;  Location: [facility];  Service: Orthopedics;  Laterality: Left;    Family History  Problem Relation Age of Onset  . Hypertension Mother   . Cancer Mother     breast  . Heart disease Father     atrial fib  . Hypertension Father   . Cancer Father     Lyphoma,Bladder  . Atrial fibrillation Father   . Cancer Maternal Grandmother     breast  . Heart disease Maternal Grandfather     MI  . Heart disease Paternal Grandfather     MI  . Cancer Maternal Aunt     ovarian  . Diverticulitis Brother  Social History   Social History  . Marital Status: Married    Spouse Name: N/A  . Number of Children: N/A  . Years of Education: N/A   Occupational History  . Not on file.   Social History Main Topics  . Smoking status: Former Smoker    Types: Cigarettes    Quit date: 02/29/1988  . Smokeless tobacco: Never Used  . Alcohol Use: No  . Drug Use: No  . Sexual Activity: Yes    Birth Control/ Protection: Surgical     Comment: BTL   Other Topics Concern  . Not on file   Social History Narrative   Lives with husband and 3 children   Owns a [employer]   Vegetarian   Wears seat belt    Outpatient Prescriptions Prior to Visit  Medication Sig Dispense Refill  . Beclomethasone Dipropionate (QNASL) 80 MCG/ACT AERS Place 2 Squirts into the nose daily as needed (allergies). Each nostril    . Calcium Carbonate-Vit D-Min (CALCIUM 1200 PO) Take by mouth.      . cetirizine (ZYRTEC) 10 MG tablet Take 10 mg by mouth daily.    [name] estradiol (VIVELLE-DOT) 0.05 MG/24HR Place 1 patch onto the skin 2 (two) times a week. Monday and Thursday    . montelukast (SINGULAIR) 10 MG tablet Take 1 tablet (10 mg total) by mouth at bedtime. 30 tablet 6  . Multiple Vitamins-Minerals (MULTIVITAMIN ADULT PO) Take by mouth.    . RESTASIS 0.05 % ophthalmic emulsion   1  . lisinopril (PRINIVIL,ZESTRIL) 10 MG tablet Take 1 tablet (10 mg total) by mouth daily. 30 tablet 6  . predniSONE (DELTASONE) 10 MG tablet 3 tabs x3 days and then 2 tabs x3 days and then 1 tab x3 days.  Take w/ food. 18 tablet 0  . albuterol (PROVENTIL HFA;VENTOLIN HFA) 108 (90 Base) MCG/ACT inhaler Inhale 2 puffs into the lungs every 6 (six) hours as needed for wheezing or shortness of breath. (Patient not taking: Reported on 08/07/2015) 1 Inhaler 0  . Vitamin D, Ergocalciferol, (DRISDOL) 50000 UNITS CAPS capsule Take 1 capsule (50,000 Units total) by mouth every 7 (seven) days. (Patient not taking: Reported on 08/07/2015) 12 capsule 0   Facility-Administered Medications Prior to Visit  Medication Dose Route Frequency Provider Last Rate Last Dose  . ipratropium-albuterol (DUONEB) 0.5-2.5 (3) MG/3ML nebulizer solution 3 mL  3 mL Nebulization Q6H Midge Minium, MD        Allergies  Allergen Reactions  . Betadine [Povidone Iodine] Other (See Comments)    Dx with allergy testing  . Cymbalta [Duloxetine Hcl] Other (See Comments)    Makes her feel crazy, out of body  . Shellfish Allergy Other (See Comments)    Dx with allergy testing    Review of Systems  Constitutional: Negative for fever, chills and malaise/fatigue.  HENT: Negative for congestion and hearing loss.   Eyes: Negative for discharge.  Respiratory: Negative for cough, sputum production and shortness of breath.   Cardiovascular: Negative for chest pain, palpitations and leg swelling.  Gastrointestinal: Negative for heartburn, nausea,  vomiting, abdominal pain, diarrhea, constipation and blood in stool.  Genitourinary: Negative for dysuria, urgency, frequency and hematuria.  Musculoskeletal: Positive for joint pain. Negative for myalgias, back pain and falls.  Skin: Negative for rash.  Neurological: Positive for headaches. Negative for dizziness, sensory change, loss of consciousness and weakness.  Endo/Heme/Allergies: Negative for environmental allergies. Does not bruise/bleed easily.  Psychiatric/Behavioral: Negative for depression and suicidal ideas.  The patient is not nervous/anxious and does not have insomnia.        Objective:    Physical Exam  Constitutional: She is oriented to person, place, and time. She appears well-developed and well-nourished. No distress.  HENT:  Head: Normocephalic and atraumatic.  Eyes: Conjunctivae are normal.  Neck: Neck supple. No thyromegaly present.  Cardiovascular: Normal rate, regular rhythm and normal heart sounds.   No murmur heard. Pulmonary/Chest: Effort normal and breath sounds normal. No respiratory distress.  Abdominal: Soft. Bowel sounds are normal. She exhibits no distension and no mass. There is no tenderness.  Musculoskeletal: She exhibits no edema.  Lymphadenopathy:    She has no cervical adenopathy.  Neurological: She is alert and oriented to person, place, and time.  Skin: Skin is warm and dry.  Psychiatric: She has a normal mood and affect. Her behavior is normal.    BP 102/70 mmHg  Pulse 86  Temp(Src) 98 F (36.7 C) (Oral)  Ht 5\' 4"  (1.626 m)  Wt 223 lb 8 oz (101.379 kg)  BMI 38.34 kg/m2  SpO2 96%  LMP 11/19/2010 Wt Readings from Last 3 Encounters:  08/07/15 223 lb 8 oz (101.379 kg)  04/02/15 221 lb 6.4 oz (100.426 kg)  03/25/15 219 lb 6.4 oz (99.519 kg)     Lab Results  Component Value Date   WBC 7.0 01/28/2015   HGB 14.2 01/28/2015   HCT 42.1 01/28/2015   PLT 335.0 01/28/2015   GLUCOSE 95 01/28/2015   CHOL 171 01/28/2015   TRIG 98.0  01/28/2015   HDL 45.10 01/28/2015   LDLCALC 106* 01/28/2015   ALT 27 01/28/2015   AST 17 01/28/2015   NA 140 01/28/2015   K 4.6 01/28/2015   CL 106 01/28/2015   CREATININE 0.54 01/28/2015   BUN 11 01/28/2015   CO2 28 01/28/2015   TSH 1.15 01/28/2015    Lab Results  Component Value Date   TSH 1.15 01/28/2015   Lab Results  Component Value Date   WBC 7.0 01/28/2015   HGB 14.2 01/28/2015   HCT 42.1 01/28/2015   MCV 87.2 01/28/2015   PLT 335.0 01/28/2015   Lab Results  Component Value Date   NA 140 01/28/2015   K 4.6 01/28/2015   CO2 28 01/28/2015   GLUCOSE 95 01/28/2015   BUN 11 01/28/2015   CREATININE 0.54 01/28/2015   BILITOT 0.5 01/28/2015   ALKPHOS 55 01/28/2015   AST 17 01/28/2015   ALT 27 01/28/2015   PROT 6.9 01/28/2015   ALBUMIN 4.1 01/28/2015   CALCIUM 9.4 01/28/2015   GFR 129.13 01/28/2015   Lab Results  Component Value Date   CHOL 171 01/28/2015   Lab Results  Component Value Date   HDL 45.10 01/28/2015   Lab Results  Component Value Date   LDLCALC 106* 01/28/2015   Lab Results  Component Value Date   TRIG 98.0 01/28/2015   Lab Results  Component Value Date   CHOLHDL 4 01/28/2015   No results found for: HGBA1C     Assessment & Plan:   Problem List Items Addressed This Visit    Vitamin D deficiency    Labs reveal deficiency. Start on Vitamin D 50000 IU caps, 1 cap po weekly x 12 weeks. Disp #4 with 4 rf. Also take daily Vitamin D over the counter. If already taking a daily supplement increase by 1000 IU daily and if not start Vitamin D 2000 IU daily.       Obesity  Encouraged DASH diet, decrease po intake and increase exercise as tolerated. Needs 7-8 hours of sleep nightly. Avoid trans fats, eat small, frequent meals every 4-5 hours with lean proteins, complex carbs and healthy fats. Minimize simple carbs      Melanoma of skin ([place])    Follows with dermatology annually      Relevant Orders   TSH   CBC   VITAMIN D 25 Hydroxy  (Vit-D Deficiency, Fractures)   Comprehensive metabolic panel   Lipid panel   GERD (gastroesophageal reflux disease)    Avoid offending foods, start probiotics. Do not eat large meals in late evening and consider raising head of bed.       Relevant Orders   TSH   CBC   VITAMIN D 25 Hydroxy (Vit-D Deficiency, Fractures)   Comprehensive metabolic panel   Lipid panel   Essential hypertension - Primary   Relevant Medications   lisinopril (PRINIVIL,ZESTRIL) 5 MG tablet   Other Relevant Orders   TSH   CBC   VITAMIN D 25 Hydroxy (Vit-D Deficiency, Fractures)   Comprehensive metabolic panel   Lipid panel   Axillary pain   Relevant Orders   TSH   CBC   VITAMIN D 25 Hydroxy (Vit-D Deficiency, Fractures)   Comprehensive metabolic panel   Lipid panel      I have discontinued Ms. Weygandt's predniSONE and lisinopril. I am also having her start on lisinopril. Additionally, I am having her maintain her Beclomethasone Dipropionate, estradiol, cetirizine, Calcium Carbonate-Vit D-Min (CALCIUM 1200 PO), RESTASIS, Multiple Vitamins-Minerals (MULTIVITAMIN ADULT PO), Vitamin D (Ergocalciferol), albuterol, and montelukast. We will continue to administer ipratropium-albuterol.  Meds ordered this encounter  Medications  . lisinopril (PRINIVIL,ZESTRIL) 5 MG tablet    Sig: Take 1 tablet (5 mg total) by mouth 2 (two) times daily.    Dispense:  60 tablet    Refill:  5     Penni Homans, MD

## 2015-08-16 NOTE — Assessment & Plan Note (Signed)
Encouraged DASH diet, decrease po intake and increase exercise as tolerated. Needs 7-8 hours of sleep nightly. Avoid trans fats, eat small, frequent meals every 4-5 hours with lean proteins, complex carbs and healthy fats. Minimize simple carbs 

## 2015-08-16 NOTE — Assessment & Plan Note (Signed)
Labs reveal deficiency. Start on Vitamin D 50000 IU caps, 1 cap po weekly x 12 weeks. Disp #4 with 4 rf. Also take daily Vitamin D over the counter. If already taking a daily supplement increase by 1000 IU daily and if not start Vitamin D 2000 IU daily.  

## 2015-08-17 ENCOUNTER — Encounter: Payer: Self-pay | Admitting: Family Medicine

## 2015-08-18 ENCOUNTER — Other Ambulatory Visit: Payer: Self-pay | Admitting: Family Medicine

## 2015-09-11 ENCOUNTER — Ambulatory Visit (INDEPENDENT_AMBULATORY_CARE_PROVIDER_SITE_OTHER): Payer: BLUE CROSS/BLUE SHIELD | Admitting: *Deleted

## 2015-09-11 ENCOUNTER — Encounter: Payer: Self-pay | Admitting: *Deleted

## 2015-09-11 DIAGNOSIS — M79629 Pain in unspecified upper arm: Secondary | ICD-10-CM

## 2015-09-11 DIAGNOSIS — M25519 Pain in unspecified shoulder: Secondary | ICD-10-CM

## 2015-09-11 DIAGNOSIS — K219 Gastro-esophageal reflux disease without esophagitis: Secondary | ICD-10-CM | POA: Diagnosis not present

## 2015-09-11 DIAGNOSIS — I10 Essential (primary) hypertension: Secondary | ICD-10-CM | POA: Diagnosis not present

## 2015-09-11 DIAGNOSIS — C439 Malignant melanoma of skin, unspecified: Secondary | ICD-10-CM | POA: Diagnosis not present

## 2015-09-11 LAB — CBC
HEMATOCRIT: 41.9 % (ref 36.0–46.0)
HEMOGLOBIN: 14.2 g/dL (ref 12.0–15.0)
MCHC: 33.8 g/dL (ref 30.0–36.0)
MCV: 85.4 fl (ref 78.0–100.0)
PLATELETS: 335 10*3/uL (ref 150.0–400.0)
RBC: 4.91 Mil/uL (ref 3.87–5.11)
RDW: 13.6 % (ref 11.5–15.5)
WBC: 6.6 10*3/uL (ref 4.0–10.5)

## 2015-09-11 LAB — LIPID PANEL
CHOLESTEROL: 156 mg/dL (ref 0–200)
HDL: 51 mg/dL (ref >39.00)
LDL CALC: 88 mg/dL (ref 0–99)
NONHDL: 105.35
Total CHOL/HDL Ratio: 3
Triglycerides: 89 mg/dL (ref 0.0–149.0)
VLDL: 17.8 mg/dL (ref 0.0–40.0)

## 2015-09-11 LAB — COMPREHENSIVE METABOLIC PANEL
ALT: 26 U/L (ref 0–35)
AST: 16 U/L (ref 0–37)
Albumin: 4.3 g/dL (ref 3.5–5.2)
Alkaline Phosphatase: 57 U/L (ref 39–117)
BUN: 11 mg/dL (ref 6–23)
CHLORIDE: 103 meq/L (ref 96–112)
CO2: 30 meq/L (ref 19–32)
CREATININE: 0.56 mg/dL (ref 0.40–1.20)
Calcium: 10.1 mg/dL (ref 8.4–10.5)
GFR: 123.49 mL/min (ref >60.00)
GLUCOSE: 93 mg/dL (ref 70–99)
POTASSIUM: 4.6 meq/L (ref 3.5–5.1)
SODIUM: 139 meq/L (ref 135–145)
Total Bilirubin: 0.5 mg/dL (ref 0.2–1.2)
Total Protein: 7 g/dL (ref 6.0–8.3)

## 2015-09-11 LAB — TSH: TSH: 1.06 u[IU]/mL (ref 0.35–4.50)

## 2015-09-11 LAB — VITAMIN D 25 HYDROXY (VIT D DEFICIENCY, FRACTURES): VITD: 32.89 ng/mL (ref 30.00–100.00)

## 2015-09-11 MED ORDER — LISINOPRIL 10 MG PO TABS: 10.0000 mg | ORAL_TABLET | Freq: Two times a day (BID) | ORAL | Status: DC

## 2015-09-11 MED ORDER — HYDROCHLOROTHIAZIDE 25 MG PO TABS: 25.0000 mg | ORAL_TABLET | Freq: Every day | ORAL | Status: DC

## 2015-09-11 NOTE — Patient Instructions (Addendum)
Per Dr. Charlett Blake: Add HCTZ 25 mg daily. Continue lisinopril 10 mg twice daily.  We will call you with your lab results.

## 2015-09-11 NOTE — Progress Notes (Signed)
Pre visit review using our clinic review tool, if applicable. No additional management support is needed unless otherwise documented below in the visit note.  Per 08/18/15 MyChart message: Dr. Charlett Blake would like you to increase the lisinopril to 10 mg twice daily. Please call the office today to schedule a nurse visit appointment in 2 weeks to check your Blood pressure.   Pt here for BP check. She has been taking lisinopril 10 mg BID as ordered, is not sure if she has taken morning dose today. Initially thought she had not taken, but now thinks maybe she did take it.  Has home wrist BP cuff and reports readings have always been high at home. Pt's wrist cuff reading taken x2 in office, 94/48 and 150/100s. Manual BP in office today 170/94 and 172/102.   BP Readings from Last 3 Encounters:  09/11/15 172/102  08/07/15 102/70  04/02/15 122/80    Per Dr. Charlett Blake: Add HCTZ 25 mg daily. Continue lisinopril 10 mg BID. Labs today. Return for blood pressure check in 3-4 weeks.  Pt verbalized understanding of instructions. Medication filled to pharmacy as requested.   Dorrene German, RN

## 2015-09-11 NOTE — Progress Notes (Signed)
RN note reviewed. Agree with documention and plan. 

## 2015-10-06 ENCOUNTER — Other Ambulatory Visit: Payer: Self-pay | Admitting: Family Medicine

## 2015-10-06 MED ORDER — HYDROCHLOROTHIAZIDE 25 MG PO TABS: 25.0000 mg | ORAL_TABLET | Freq: Every day | ORAL | 2 refills | Status: DC

## 2015-11-05 ENCOUNTER — Other Ambulatory Visit: Payer: Self-pay | Admitting: Family Medicine

## 2015-11-05 MED ORDER — LISINOPRIL 10 MG PO TABS: 10.0000 mg | ORAL_TABLET | Freq: Two times a day (BID) | ORAL | 4 refills | Status: DC

## 2016-02-02 ENCOUNTER — Other Ambulatory Visit (INDEPENDENT_AMBULATORY_CARE_PROVIDER_SITE_OTHER): Payer: BLUE CROSS/BLUE SHIELD

## 2016-02-02 DIAGNOSIS — R05 Cough: Secondary | ICD-10-CM

## 2016-02-02 DIAGNOSIS — E559 Vitamin D deficiency, unspecified: Secondary | ICD-10-CM

## 2016-02-02 DIAGNOSIS — I10 Essential (primary) hypertension: Secondary | ICD-10-CM | POA: Diagnosis not present

## 2016-02-02 DIAGNOSIS — R509 Fever, unspecified: Secondary | ICD-10-CM | POA: Diagnosis not present

## 2016-02-02 DIAGNOSIS — R059 Cough, unspecified: Secondary | ICD-10-CM

## 2016-02-02 LAB — COMPREHENSIVE METABOLIC PANEL
ALBUMIN: 4.4 g/dL (ref 3.5–5.2)
ALK PHOS: 61 U/L (ref 39–117)
ALT: 27 U/L (ref 0–35)
AST: 18 U/L (ref 0–37)
BUN: 10 mg/dL (ref 6–23)
CHLORIDE: 100 meq/L (ref 96–112)
CO2: 29 mEq/L (ref 19–32)
Calcium: 10 mg/dL (ref 8.4–10.5)
Creatinine, Ser: 0.6 mg/dL (ref 0.40–1.20)
GFR: 113.84 mL/min (ref >60.00)
Glucose, Bld: 107 mg/dL — ABNORMAL HIGH (ref 70–99)
POTASSIUM: 4.3 meq/L (ref 3.5–5.1)
Sodium: 137 mEq/L (ref 135–145)
TOTAL PROTEIN: 7.3 g/dL (ref 6.0–8.3)
Total Bilirubin: 0.6 mg/dL (ref 0.2–1.2)

## 2016-02-02 LAB — CBC WITH DIFFERENTIAL/PLATELET
BASOS PCT: 0.6 % (ref 0.0–3.0)
Basophils Absolute: 0 10*3/uL (ref 0.0–0.1)
EOS PCT: 4.8 % (ref 0.0–5.0)
Eosinophils Absolute: 0.4 10*3/uL (ref 0.0–0.7)
HCT: 44.1 % (ref 36.0–46.0)
HEMOGLOBIN: 15.1 g/dL — AB (ref 12.0–15.0)
LYMPHS ABS: 2.4 10*3/uL (ref 0.7–4.0)
Lymphocytes Relative: 31.6 % (ref 12.0–46.0)
MCHC: 34.2 g/dL (ref 30.0–36.0)
MCV: 86.6 fl (ref 78.0–100.0)
MONO ABS: 0.5 10*3/uL (ref 0.1–1.0)
MONOS PCT: 6.1 % (ref 3.0–12.0)
Neutro Abs: 4.3 10*3/uL (ref 1.4–7.7)
Neutrophils Relative %: 56.9 % (ref 43.0–77.0)
Platelets: 380 10*3/uL (ref 150.0–400.0)
RBC: 5.09 Mil/uL (ref 3.87–5.11)
RDW: 14 % (ref 11.5–15.5)
WBC: 7.6 10*3/uL (ref 4.0–10.5)

## 2016-02-06 LAB — VITAMIN D 1,25 DIHYDROXY
VITAMIN D2 1, 25 (OH): 10 pg/mL
Vitamin D 1, 25 (OH)2 Total: 28 pg/mL (ref 18–72)
Vitamin D3 1, 25 (OH)2: 18 pg/mL

## 2016-02-09 ENCOUNTER — Ambulatory Visit (INDEPENDENT_AMBULATORY_CARE_PROVIDER_SITE_OTHER): Payer: BLUE CROSS/BLUE SHIELD | Admitting: Family Medicine

## 2016-02-09 ENCOUNTER — Encounter: Payer: Self-pay | Admitting: Family Medicine

## 2016-02-09 DIAGNOSIS — E559 Vitamin D deficiency, unspecified: Secondary | ICD-10-CM

## 2016-02-09 DIAGNOSIS — E6609 Other obesity due to excess calories: Secondary | ICD-10-CM | POA: Diagnosis not present

## 2016-02-09 DIAGNOSIS — M79605 Pain in left leg: Secondary | ICD-10-CM | POA: Diagnosis not present

## 2016-02-09 DIAGNOSIS — M25562 Pain in left knee: Secondary | ICD-10-CM | POA: Insufficient documentation

## 2016-02-09 HISTORY — DX: Pain in left leg: M79.605

## 2016-02-09 MED ORDER — HYDROCHLOROTHIAZIDE 25 MG PO TABS
25.0000 mg | ORAL_TABLET | Freq: Every day | ORAL | 2 refills | Status: DC
Start: 2016-02-09 — End: 2016-05-28

## 2016-02-09 MED ORDER — LISINOPRIL 10 MG PO TABS: 10.0000 mg | ORAL_TABLET | Freq: Two times a day (BID) | ORAL | 4 refills | Status: DC

## 2016-02-09 NOTE — Assessment & Plan Note (Signed)
Doing better at present but has intermittent trouble. Has a history of arthroscopy in left knee. Pain is from hip to foot when she lies on left or right side does not hurt when lying on stomach or back unless she is semireclining. . Pain can get severe. Will refer to sports med for further evaluation. Has seen ortho and chiropractic. This has been going on for years

## 2016-02-09 NOTE — Progress Notes (Signed)
Pre visit review using our clinic review tool, if applicable. No additional management support is needed unless otherwise documented below in the visit note. 

## 2016-02-09 NOTE — Assessment & Plan Note (Signed)
Encouraged DASH diet, decrease po intake and increase exercise as tolerated. Needs 7-8 hours of sleep nightly. Avoid trans fats, eat small, frequent meals every 4-5 hours with lean proteins, complex carbs and healthy fats. Minimize simple carbs, GMO foods. 

## 2016-02-09 NOTE — Assessment & Plan Note (Signed)
WNL on recent check. Take daily supplements

## 2016-02-09 NOTE — Patient Instructions (Signed)
DASH Eating Plan DASH stands for "Dietary Approaches to Stop Hypertension." The DASH eating plan is a healthy eating plan that has been shown to reduce high blood pressure (hypertension). Additional health benefits may include reducing the risk of type 2 diabetes mellitus, heart disease, and stroke. The DASH eating plan may also help with weight loss. What do I need to know about the DASH eating plan? For the DASH eating plan, you will follow these general guidelines:  Choose foods with less than 150 milligrams of sodium per serving (as listed on the food label).  Use salt-free seasonings or herbs instead of table salt or sea salt.  Check with your health care provider or pharmacist before using salt substitutes.  Eat lower-sodium products. These are often labeled as "low-sodium" or "no salt added."  Eat fresh foods. Avoid eating a lot of canned foods.  Eat more vegetables, fruits, and low-fat dairy products.  Choose whole grains. Look for the word "whole" as the first word in the ingredient list.  Choose fish and skinless chicken or turkey more often than red meat. Limit fish, poultry, and meat to 6 oz (170 g) each day.  Limit sweets, desserts, sugars, and sugary drinks.  Choose heart-healthy fats.  Eat more home-cooked food and less restaurant, buffet, and fast food.  Limit fried foods.  Do not fry foods. Cook foods using methods such as baking, boiling, grilling, and broiling instead.  When eating at a restaurant, ask that your food be prepared with less salt, or no salt if possible. What foods can I eat? Seek help from a dietitian for individual calorie needs. Grains  Whole grain or whole wheat bread. Brown rice. Whole grain or whole wheat pasta. Quinoa, bulgur, and whole grain cereals. Low-sodium cereals. Corn or whole wheat flour tortillas. Whole grain cornbread. Whole grain crackers. Low-sodium crackers. Vegetables  Fresh or frozen vegetables (raw, steamed, roasted, or  grilled). Low-sodium or reduced-sodium tomato and vegetable juices. Low-sodium or reduced-sodium tomato sauce and paste. Low-sodium or reduced-sodium canned vegetables. Fruits  All fresh, canned (in natural juice), or frozen fruits. Meat and Other Protein Products  Ground beef (85% or leaner), grass-fed beef, or beef trimmed of fat. Skinless chicken or turkey. Ground chicken or turkey. Pork trimmed of fat. All fish and seafood. Eggs. Dried beans, peas, or lentils. Unsalted nuts and seeds. Unsalted canned beans. Dairy  Low-fat dairy products, such as skim or 1% milk, 2% or reduced-fat cheeses, low-fat ricotta or cottage cheese, or plain low-fat yogurt. Low-sodium or reduced-sodium cheeses. Fats and Oils  Tub margarines without trans fats. Light or reduced-fat mayonnaise and salad dressings (reduced sodium). Avocado. Safflower, olive, or canola oils. Natural peanut or almond butter. Other  Unsalted popcorn and pretzels. The items listed above may not be a complete list of recommended foods or beverages. Contact your dietitian for more options.  What foods are not recommended? Grains  White bread. White pasta. White rice. Refined cornbread. Bagels and croissants. Crackers that contain trans fat. Vegetables  Creamed or fried vegetables. Vegetables in a cheese sauce. Regular canned vegetables. Regular canned tomato sauce and paste. Regular tomato and vegetable juices. Fruits  Canned fruit in light or heavy syrup. Fruit juice. Meat and Other Protein Products  Fatty cuts of meat. Ribs, chicken wings, bacon, sausage, bologna, salami, chitterlings, fatback, hot dogs, bratwurst, and packaged luncheon meats. Salted nuts and seeds. Canned beans with salt. Dairy  Whole or 2% milk, cream, half-and-half, and cream cheese. Whole-fat or sweetened yogurt. Full-fat cheeses   or blue cheese. Nondairy creamers and whipped toppings. Processed cheese, cheese spreads, or cheese curds. Condiments  Onion and garlic  salt, seasoned salt, table salt, and sea salt. Canned and packaged gravies. Worcestershire sauce. Tartar sauce. Barbecue sauce. Teriyaki sauce. Soy sauce, including reduced sodium. Steak sauce. Fish sauce. Oyster sauce. Cocktail sauce. Horseradish. Ketchup and mustard. Meat flavorings and tenderizers. Bouillon cubes. Hot sauce. Tabasco sauce. Marinades. Taco seasonings. Relishes. Fats and Oils  Butter, stick margarine, lard, shortening, ghee, and bacon fat. Coconut, palm kernel, or palm oils. Regular salad dressings. Other  Pickles and olives. Salted popcorn and pretzels. The items listed above may not be a complete list of foods and beverages to avoid. Contact your dietitian for more information.  Where can I find more information? National Heart, Lung, and Blood Institute: www.nhlbi.nih.gov/health/health-topics/topics/dash/ This information is not intended to replace advice given to you by your health care provider. Make sure you discuss any questions you have with your health care provider. Document Released: 02/03/2011 Document Revised: 07/23/2015 Document Reviewed: 12/19/2012 [name]  2017 Elsevier Inc.  

## 2016-02-09 NOTE — Progress Notes (Signed)
Patient ID: [name], female   DOB: 07/23/1970, 45 y.o.   MRN: [id number]   Subjective:    Patient ID: [name], female    DOB: 1970-05-02, 45 y.o.   MRN: [id number]  Chief Complaint  Patient presents with  . Follow-up    HPI Patient is in today for follow up patient c/o left leg pain. Doing better at present but has intermittent trouble. Has a history of arthroscopy in left knee. Pain is from hip to foot when she lies on left or right side does not hurt when lying on stomach or back unless she is semireclining. . Pain can get severe. Denies CP/palp/SOB/HA/congestion/fevers/GI or GU c/o. Taking meds as prescribed  Past Medical History:  Diagnosis Date  . Acute mastitis of left breast 10/04/2007  . AMA (advanced maternal age) multigravida 35+   . Cancer ([place]) 2002   Left leg  . Celiac disease   . Chronic back pain    H/O  . Complication of anesthesia    Difficult time waking up  . Constipation in pregnancy 03/2006  . Endometriosis    h/o  . Environmental allergies   . Fibroid    During first pregnancy  . Fibromyalgia   . First trimester bleeding 2008  . GBS carrier   . GERD (gastroesophageal reflux disease)   . H/O amenorrhea 10/2006  . H/O varicella    As an infant.  . Headache(784.0)   . Heart murmur   . Irregular uterine bleeding   . Leg pain, left 02/09/2016  . Monilial vaginitis 07/27/04  . Rh negative status during pregnancy   . SAB (spontaneous abortion)    h/o x 2  . Seasonal allergies   . TMJ syndrome   . Vitamin D deficiency 08/16/2015    Past Surgical History:  Procedure Laterality Date  . ABDOMINAL HYSTERECTOMY  2013  . APPENDECTOMY  2002  . DILATION AND EVACUATION    . endocervical polyp removal  2001  . ENDOMETRIAL ABLATION  2010  . HERNIA REPAIR  05/16/12   incisional hernia repair  . HYSTEROSCOPY W/ ENDOMETRIAL ABLATION  01/28/2009  . INSERTION OF MESH N/A 05/16/2012   Procedure: INSERTION OF MESH;  Surgeon: Joyice Faster. Cornett, MD;   Location: [facility];  Service: General;  Laterality: N/A;  . KNEE ARTHROSCOPY Left 12/05/2012   Procedure: LEFT KNEE ARTHROSCOPY;  Surgeon: Kerin Salen, MD;  Location: [facility];  Service: Orthopedics;  Laterality: Left;  [name] MELANOMA EXCISION  2002   Left Leg  . melanoma removal   2003  . SALPINGOOPHORECTOMY  12/08/2011   Procedure: SALPINGO OOPHERECTOMY;  Surgeon: Eldred Manges, MD;  Location: [facility];  Service: Gynecology;  Laterality: Bilateral;  . TENDON RELEASE  2004  . uterine curettage  01/2009  . VENTRAL HERNIA REPAIR N/A 05/16/2012   Procedure: LAPAROSCOPIC VENTRAL HERNIA;  Surgeon: Joyice Faster. Cornett, MD;  Location: [facility];  Service: General;  Laterality: N/A;  . WISDOM TOOTH EXTRACTION  1989    Family History  Problem Relation Age of Onset  . Hypertension Mother   . Cancer Mother     breast  . Heart disease Father     atrial fib  . Hypertension Father   . Cancer Father     Lyphoma,Bladder  . Atrial fibrillation Father   . Cancer Maternal Grandmother     breast  . Heart disease Maternal Grandfather     MI  . Heart disease Paternal Grandfather  MI  . Cancer Maternal Aunt     ovarian  . Diverticulitis Brother     Social History   Social History  . Marital status: Married    Spouse name: N/A  . Number of children: N/A  . Years of education: N/A   Occupational History  . Not on file.   Social History Main Topics  . Smoking status: Former Smoker    Types: Cigarettes    Quit date: 02/29/1988  . Smokeless tobacco: Never Used  . Alcohol use No  . Drug use: No  . Sexual activity: Yes    Birth control/ protection: Surgical     Comment: BTL   Other Topics Concern  . 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 Beclomethasone Dipropionate (QNASL) 80 MCG/ACT AERS Place 2  Squirts into the nose daily as needed (allergies). Each nostril    . Calcium Carbonate-Vit D-Min (CALCIUM 1200 PO) Take by mouth.    . cetirizine (ZYRTEC) 10 MG tablet Take 10 mg by mouth daily.    [name] estradiol (VIVELLE-DOT) 0.05 MG/24HR Place 1 patch onto the skin 2 (two) times a week. Monday and Thursday    . montelukast (SINGULAIR) 10 MG tablet Take 1 tablet (10 mg total) by mouth at bedtime. 30 tablet 6  . Multiple Vitamins-Minerals (MULTIVITAMIN ADULT PO) Take by mouth.    . hydrochlorothiazide (HYDRODIURIL) 25 MG tablet Take 1 tablet (25 mg total) by mouth daily. 30 tablet 2  . lisinopril (PRINIVIL,ZESTRIL) 10 MG tablet Take 1 tablet (10 mg total) by mouth 2 (two) times daily. 60 tablet 4  . albuterol (PROVENTIL HFA;VENTOLIN HFA) 108 (90 Base) MCG/ACT inhaler Inhale 2 puffs into the lungs every 6 (six) hours as needed for wheezing or shortness of breath. (Patient not taking: Reported on 02/09/2016) 1 Inhaler 0  . RESTASIS 0.05 % ophthalmic emulsion   1  . Vitamin D, Ergocalciferol, (DRISDOL) 50000 UNITS CAPS capsule Take 1 capsule (50,000 Units total) by mouth every 7 (seven) days. (Patient not taking: Reported on 02/09/2016) 12 capsule 0   Facility-Administered Medications Prior to Visit  Medication Dose Route Frequency Provider Last Rate Last Dose  . ipratropium-albuterol (DUONEB) 0.5-2.5 (3) MG/3ML nebulizer solution 3 mL  3 mL Nebulization Q6H Midge Minium, MD        Allergies  Allergen Reactions  . Betadine [Povidone Iodine] Other (See Comments)    Dx with allergy testing  . Cymbalta [Duloxetine Hcl] Other (See Comments)    Makes her feel crazy, out of body  . Shellfish Allergy Other (See Comments)    Dx with allergy testing    Review of Systems  Constitutional: Negative for fever.  Eyes: Negative for blurred vision.  Respiratory: Negative for cough and shortness of breath.   Cardiovascular: Negative for chest pain and palpitations.  Gastrointestinal: Positive for  abdominal pain. Negative for vomiting.  Musculoskeletal: Negative for back pain.  Skin: Negative for rash.  Neurological: Negative for loss of consciousness and headaches.       Objective:    Physical Exam  Constitutional: She is oriented to person, place, and time. She appears well-developed and well-nourished. No distress.  HENT:  Head: Normocephalic and atraumatic.  Eyes: Conjunctivae are normal.  Neck: Normal range of motion. No thyromegaly present.  Cardiovascular: Normal rate and regular rhythm.   Pulmonary/Chest: Effort normal and breath sounds normal. She has no wheezes.  Abdominal: Soft. Bowel sounds are normal. There is no tenderness.  Musculoskeletal: Normal range of motion. She exhibits no edema or deformity.  Neurological: She is alert and oriented to person, place, and time.  Skin: Skin is warm and dry. She is not diaphoretic.  Psychiatric: She has a normal mood and affect.    BP 100/70 (BP Location: Left Arm, Patient Position: Sitting, Cuff Size: Normal)   Pulse 63   Temp 98.1 F (36.7 C) (Oral)   Wt 227 lb 9.6 oz (103.2 kg)   LMP 11/19/2010   SpO2 95%   BMI 39.07 kg/m  Wt Readings from Last 3 Encounters:  02/09/16 227 lb 9.6 oz (103.2 kg)  08/07/15 223 lb 8 oz (101.4 kg)  04/02/15 221 lb 6.4 oz (100.4 kg)     Lab Results  Component Value Date   WBC 7.6 02/02/2016   HGB 15.1 (H) 02/02/2016   HCT 44.1 02/02/2016   PLT 380.0 02/02/2016   GLUCOSE 107 (H) 02/02/2016   CHOL 156 09/11/2015   TRIG 89.0 09/11/2015   HDL 51.00 09/11/2015   LDLCALC 88 09/11/2015   ALT 27 02/02/2016   AST 18 02/02/2016   NA 137 02/02/2016   K 4.3 02/02/2016   CL 100 02/02/2016   CREATININE 0.60 02/02/2016   BUN 10 02/02/2016   CO2 29 02/02/2016   TSH 1.06 09/11/2015    Lab Results  Component Value Date   TSH 1.06 09/11/2015   Lab Results  Component Value Date   WBC 7.6 02/02/2016   HGB 15.1 (H) 02/02/2016   HCT 44.1 02/02/2016   MCV 86.6 02/02/2016   PLT  380.0 02/02/2016   Lab Results  Component Value Date   NA 137 02/02/2016   K 4.3 02/02/2016   CO2 29 02/02/2016   GLUCOSE 107 (H) 02/02/2016   BUN 10 02/02/2016   CREATININE 0.60 02/02/2016   BILITOT 0.6 02/02/2016   ALKPHOS 61 02/02/2016   AST 18 02/02/2016   ALT 27 02/02/2016   PROT 7.3 02/02/2016   ALBUMIN 4.4 02/02/2016   CALCIUM 10.0 02/02/2016   GFR 113.84 02/02/2016   Lab Results  Component Value Date   CHOL 156 09/11/2015   Lab Results  Component Value Date   HDL 51.00 09/11/2015   Lab Results  Component Value Date   LDLCALC 88 09/11/2015   Lab Results  Component Value Date   TRIG 89.0 09/11/2015   Lab Results  Component Value Date   CHOLHDL 3 09/11/2015   No results found for: HGBA1C I acted as a Education administrator for Dr. Charlett Blake. Princess, RMA     Assessment & Plan:   Problem List Items Addressed This Visit    Obesity    Encouraged DASH diet, decrease po intake and increase exercise as tolerated. Needs 7-8 hours of sleep nightly. Avoid trans fats, eat small, frequent meals every 4-5 hours with lean proteins, complex carbs and healthy fats. Minimize simple carbs, GMO foods.      Vitamin D deficiency    WNL on recent check. Take daily supplements       Leg pain, left    Doing better at present but has intermittent trouble. Has a history of arthroscopy in left knee. Pain is from hip to foot when she lies on left or right side does not hurt when lying on stomach or back unless she is  semireclining. . Pain can get severe. Will refer to sports med for further evaluation. Has seen ortho and chiropractic. This has been going on for years      Relevant Orders   Ambulatory referral to Sports Medicine      I am having Ms. Matteucci maintain her Beclomethasone Dipropionate, estradiol, cetirizine, Calcium Carbonate-Vit D-Min (CALCIUM 1200 PO), RESTASIS, Multiple Vitamins-Minerals (MULTIVITAMIN ADULT PO), Vitamin D (Ergocalciferol), albuterol, montelukast,  hydrochlorothiazide, and lisinopril. We will continue to administer ipratropium-albuterol.  Meds ordered this encounter  Medications  . hydrochlorothiazide (HYDRODIURIL) 25 MG tablet    Sig: Take 1 tablet (25 mg total) by mouth daily.    Dispense:  30 tablet    Refill:  2  . lisinopril (PRINIVIL,ZESTRIL) 10 MG tablet    Sig: Take 1 tablet (10 mg total) by mouth 2 (two) times daily.    Dispense:  60 tablet    Refill:  4   CMA functioned as scribe during visit, documentation is reviewed and attested to.   Penni Homans, MD

## 2016-02-18 ENCOUNTER — Ambulatory Visit (INDEPENDENT_AMBULATORY_CARE_PROVIDER_SITE_OTHER): Payer: BLUE CROSS/BLUE SHIELD | Admitting: Family Medicine

## 2016-02-18 ENCOUNTER — Encounter: Payer: Self-pay | Admitting: Family Medicine

## 2016-02-18 DIAGNOSIS — M79605 Pain in left leg: Secondary | ICD-10-CM

## 2016-02-18 NOTE — Patient Instructions (Signed)
You have lumbar radiculopathy (a pinched nerve in your low back). Tylenol, aleve or ibuprofen only if needed. Start physical therapy and do home exercises on days you don't go to therapy. Prednisone, muscle relaxants are options if this becomes severe. If not improving, will consider further imaging (MRI) also. Follow up with me 4-6 weeks after starting therapy.

## 2016-02-28 NOTE — Progress Notes (Signed)
PCP and consultation requested by: Penni Homans, MD  Subjective:   HPI: Patient is a 45 y.o. female here for left leg pain.  Patient reports she's had left leg pain for several years. Pain comes and goes. Currently 0/10 level, can be sharp and severe. Pain worse with lying down on right side. Also bothers in recliner with her feet up. Goes down to ankle. Describes a numbness. Told she had a bulging disc on left side years ago. Has also had surgery on left knee 3 years ago. No bowel/bladder dysfunction.  Past Medical History:  Diagnosis Date  . Acute mastitis of left breast 10/04/2007  . AMA (advanced maternal age) multigravida 9+   . Cancer ([place]) 2002   Left leg  . Celiac disease   . Chronic back pain    H/O  . Complication of anesthesia    Difficult time waking up  . Constipation in pregnancy 03/2006  . Endometriosis    h/o  . Environmental allergies   . Fibroid    During first pregnancy  . Fibromyalgia   . First trimester bleeding 2008  . GBS carrier   . GERD (gastroesophageal reflux disease)   . H/O amenorrhea 10/2006  . H/O varicella    As an infant.  . Headache(784.0)   . Heart murmur   . Irregular uterine bleeding   . Leg pain, left 02/09/2016  . Monilial vaginitis 07/27/04  . Rh negative status during pregnancy   . SAB (spontaneous abortion)    h/o x 2  . Seasonal allergies   . TMJ syndrome   . Vitamin D deficiency 08/16/2015    Current Outpatient Prescriptions on File Prior to Visit  Medication Sig Dispense Refill  . albuterol (PROVENTIL HFA;VENTOLIN HFA) 108 (90 Base) MCG/ACT inhaler Inhale 2 puffs into the lungs every 6 (six) hours as needed for wheezing or shortness of breath. (Patient not taking: Reported on 02/09/2016) 1 Inhaler 0  . Beclomethasone Dipropionate (QNASL) 80 MCG/ACT AERS Place 2 Squirts into the nose daily as needed (allergies). Each nostril    . Calcium Carbonate-Vit D-Min (CALCIUM 1200 PO) Take by mouth.    . cetirizine (ZYRTEC) 10  MG tablet Take 10 mg by mouth daily.    [name] estradiol (VIVELLE-DOT) 0.05 MG/24HR Place 1 patch onto the skin 2 (two) times a week. Monday and Thursday    . hydrochlorothiazide (HYDRODIURIL) 25 MG tablet Take 1 tablet (25 mg total) by mouth daily. 30 tablet 2  . lisinopril (PRINIVIL,ZESTRIL) 10 MG tablet Take 1 tablet (10 mg total) by mouth 2 (two) times daily. 60 tablet 4  . montelukast (SINGULAIR) 10 MG tablet Take 1 tablet (10 mg total) by mouth at bedtime. 30 tablet 6  . Multiple Vitamins-Minerals (MULTIVITAMIN ADULT PO) Take by mouth.    . RESTASIS 0.05 % ophthalmic emulsion   1  . Vitamin D, Ergocalciferol, (DRISDOL) 50000 UNITS CAPS capsule Take 1 capsule (50,000 Units total) by mouth every 7 (seven) days. (Patient not taking: Reported on 02/09/2016) 12 capsule 0   Current Facility-Administered Medications on File Prior to Visit  Medication Dose Route Frequency Provider Last Rate Last Dose  . ipratropium-albuterol (DUONEB) 0.5-2.5 (3) MG/3ML nebulizer solution 3 mL  3 mL Nebulization Q6H Midge Minium, MD        Past Surgical History:  Procedure Laterality Date  . ABDOMINAL HYSTERECTOMY  2013  . APPENDECTOMY  2002  . DILATION AND EVACUATION    . endocervical polyp removal  2001  .  ENDOMETRIAL ABLATION  2010  . HERNIA REPAIR  05/16/12   incisional hernia repair  . HYSTEROSCOPY W/ ENDOMETRIAL ABLATION  01/28/2009  . INSERTION OF MESH N/A 05/16/2012   Procedure: INSERTION OF MESH;  Surgeon: Joyice Faster. Cornett, MD;  Location: [facility];  Service: General;  Laterality: N/A;  . KNEE ARTHROSCOPY Left 12/05/2012   Procedure: LEFT KNEE ARTHROSCOPY;  Surgeon: Kerin Salen, MD;  Location: [facility];  Service: Orthopedics;  Laterality: Left;  [name] MELANOMA EXCISION  2002   Left Leg  . melanoma removal   2003  . SALPINGOOPHORECTOMY  12/08/2011   Procedure: SALPINGO OOPHERECTOMY;  Surgeon: Eldred Manges, MD;  Location: [facility];  Service: Gynecology;  Laterality: Bilateral;  .  TENDON RELEASE  2004  . uterine curettage  01/2009  . VENTRAL HERNIA REPAIR N/A 05/16/2012   Procedure: LAPAROSCOPIC VENTRAL HERNIA;  Surgeon: Joyice Faster. Cornett, MD;  Location: [facility];  Service: General;  Laterality: N/A;  . WISDOM TOOTH EXTRACTION  1989    Allergies  Allergen Reactions  . Betadine [Povidone Iodine] Other (See Comments)    Dx with allergy testing  . Cymbalta [Duloxetine Hcl] Other (See Comments)    Makes her feel crazy, out of body  . Shellfish Allergy Other (See Comments)    Dx with allergy testing    Social History   Social History  . Marital status: Married    Spouse name: N/A  . Number of children: N/A  . Years of education: N/A   Occupational History  . Not on file.   Social History Main Topics  . Smoking status: Former Smoker    Types: Cigarettes    Quit date: 02/29/1988  . Smokeless tobacco: Never Used  . Alcohol use No  . Drug use: No  . Sexual activity: Yes    Birth control/ protection: Surgical     Comment: BTL   Other Topics Concern  . 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 Hypertension Mother   . Cancer Mother     breast  . Heart disease Father     atrial fib  . Hypertension Father   . Cancer Father     Lyphoma,Bladder  . Atrial fibrillation Father   . Cancer Maternal Grandmother     breast  . Heart disease Maternal Grandfather     MI  . Heart disease Paternal Grandfather     MI  . Cancer Maternal Aunt     ovarian  . Diverticulitis Brother     BP 130/86   Pulse 89   Ht 5\' 4"  (1.626 m)   Wt 220 lb (99.8 kg)   LMP 11/19/2010   BMI 37.76 kg/m   Review of Systems: See HPI above.     Objective:  Physical Exam:  Gen: NAD, comfortable in exam room  Back/left leg: No gross deformity, scoliosis. No TTP currently.  No midline or bony TTP. FROM. Strength LEs 5/5 all  muscle groups.   1+ MSRs in patellar and achilles tendons, equal bilaterally. Negative SLRs. Sensation intact to light touch bilaterally. Negative logroll bilateral hips Negative fabers and piriformis stretches.   Assessment & Plan:  1. Left lumbar radiculopathy - symptoms intermittent.  Discussed tylenol, ibuprofen, or aleve only if needed.  Start physical therapy and  home exercises.  Can consider prednisone, muscle relaxants if becomes severe.  F/u 4-6 weeks after starting therapy.

## 2016-02-28 NOTE — Assessment & Plan Note (Signed)
symptoms intermittent.  Discussed tylenol, ibuprofen, or aleve only if needed.  Start physical therapy and home exercises.  Can consider prednisone, muscle relaxants if becomes severe.  F/u 4-6 weeks after starting therapy.

## 2016-03-01 NOTE — Addendum Note (Signed)
Addended by: Sherrie George F on: 03/01/2016 08:49 AM   Modules accepted: Orders

## 2016-03-02 NOTE — Addendum Note (Signed)
Addended by: Sherrie George F on: 03/02/2016 08:25 AM   Modules accepted: Orders

## 2016-03-28 ENCOUNTER — Encounter: Payer: Self-pay | Admitting: Family Medicine

## 2016-05-28 ENCOUNTER — Other Ambulatory Visit: Payer: Self-pay | Admitting: Family Medicine

## 2016-06-23 DIAGNOSIS — N809 Endometriosis, unspecified: Secondary | ICD-10-CM | POA: Diagnosis not present

## 2016-06-23 DIAGNOSIS — Z01419 Encounter for gynecological examination (general) (routine) without abnormal findings: Secondary | ICD-10-CM | POA: Diagnosis not present

## 2016-06-23 DIAGNOSIS — Z1231 Encounter for screening mammogram for malignant neoplasm of breast: Secondary | ICD-10-CM | POA: Diagnosis not present

## 2016-06-23 LAB — HM MAMMOGRAPHY

## 2016-07-07 ENCOUNTER — Encounter: Payer: Self-pay | Admitting: Family Medicine

## 2016-08-15 ENCOUNTER — Encounter: Payer: Self-pay | Admitting: Family Medicine

## 2016-08-15 ENCOUNTER — Ambulatory Visit (INDEPENDENT_AMBULATORY_CARE_PROVIDER_SITE_OTHER): Payer: 59 | Admitting: Family Medicine

## 2016-08-15 VITALS — BP 98/70 | HR 62 | Temp 98.0°F | Resp 18 | Ht 64.0 in | Wt 215.0 lb

## 2016-08-15 DIAGNOSIS — E6609 Other obesity due to excess calories: Secondary | ICD-10-CM | POA: Diagnosis not present

## 2016-08-15 DIAGNOSIS — E559 Vitamin D deficiency, unspecified: Secondary | ICD-10-CM

## 2016-08-15 DIAGNOSIS — K589 Irritable bowel syndrome without diarrhea: Secondary | ICD-10-CM | POA: Insufficient documentation

## 2016-08-15 DIAGNOSIS — I10 Essential (primary) hypertension: Secondary | ICD-10-CM | POA: Diagnosis not present

## 2016-08-15 DIAGNOSIS — Z0000 Encounter for general adult medical examination without abnormal findings: Secondary | ICD-10-CM

## 2016-08-15 DIAGNOSIS — R002 Palpitations: Secondary | ICD-10-CM | POA: Diagnosis not present

## 2016-08-15 DIAGNOSIS — Z789 Other specified health status: Secondary | ICD-10-CM | POA: Diagnosis not present

## 2016-08-15 DIAGNOSIS — G4730 Sleep apnea, unspecified: Secondary | ICD-10-CM | POA: Insufficient documentation

## 2016-08-15 DIAGNOSIS — H9190 Unspecified hearing loss, unspecified ear: Secondary | ICD-10-CM

## 2016-08-15 DIAGNOSIS — R252 Cramp and spasm: Secondary | ICD-10-CM

## 2016-08-15 HISTORY — DX: Irritable bowel syndrome, unspecified: K58.9

## 2016-08-15 HISTORY — DX: Other specified health status: Z78.9

## 2016-08-15 HISTORY — DX: Sleep apnea, unspecified: G47.30

## 2016-08-15 HISTORY — DX: Unspecified hearing loss, unspecified ear: H91.90

## 2016-08-15 LAB — COMPREHENSIVE METABOLIC PANEL
ALK PHOS: 56 U/L (ref 39–117)
ALT: 30 U/L (ref 0–35)
AST: 18 U/L (ref 0–37)
Albumin: 4.5 g/dL (ref 3.5–5.2)
BILIRUBIN TOTAL: 0.6 mg/dL (ref 0.2–1.2)
BUN: 13 mg/dL (ref 6–23)
CO2: 30 mEq/L (ref 19–32)
Calcium: 9.7 mg/dL (ref 8.4–10.5)
Chloride: 100 mEq/L (ref 96–112)
Creatinine, Ser: 0.56 mg/dL (ref 0.40–1.20)
GFR: 122.99 mL/min (ref >60.00)
GLUCOSE: 98 mg/dL (ref 70–99)
Potassium: 4.3 mEq/L (ref 3.5–5.1)
Sodium: 136 mEq/L (ref 135–145)
TOTAL PROTEIN: 7 g/dL (ref 6.0–8.3)

## 2016-08-15 LAB — CBC
HCT: 43.6 % (ref 36.0–46.0)
HEMOGLOBIN: 14.9 g/dL (ref 12.0–15.0)
MCHC: 34.3 g/dL (ref 30.0–36.0)
MCV: 86.8 fl (ref 78.0–100.0)
PLATELETS: 348 10*3/uL (ref 150.0–400.0)
RBC: 5.02 Mil/uL (ref 3.87–5.11)
RDW: 13.9 % (ref 11.5–15.5)
WBC: 6.6 10*3/uL (ref 4.0–10.5)

## 2016-08-15 LAB — TSH: TSH: 1.05 u[IU]/mL (ref 0.35–4.50)

## 2016-08-15 LAB — MAGNESIUM: Magnesium: 2 mg/dL (ref 1.5–2.5)

## 2016-08-15 LAB — VITAMIN B12: Vitamin B-12: 397 pg/mL (ref 211–911)

## 2016-08-15 LAB — LIPASE: Lipase: 17 U/L (ref 11.0–59.0)

## 2016-08-15 LAB — VITAMIN D 25 HYDROXY (VIT D DEFICIENCY, FRACTURES): VITD: 21.13 ng/mL — AB (ref 30.00–100.00)

## 2016-08-15 NOTE — Progress Notes (Signed)
Subjective:  I acted as a Education administrator for Dr. Charlett Blake. [name], [place]  Patient ID: [name], female    DOB: 02-06-1971, 46 y.o.   MRN: [id number]  Chief Complaint  Patient presents with  . Annual Exam    HPI  Patient is in today for an annual exam. Patient states at night she has hot flashes and palpitations.No recent febrile illness or acute hospitalizations. Denies CP/palp/SOB/HA/congestion/fevers/GI or GU c/o. Taking meds as prescribed. She endorses leg cramps at times but acknowledges some dehydration when this happens. She feels the palpitatiosn when her insurance made her change her estrogen patch back to the brand name.    Patient Care Team: Mosie Lukes, MD as PCP - General (Family Medicine) Leo Grosser Seymour Bars, MD as Consulting Physician (Obstetrics and Gynecology) Tiajuana Amass, MD as Referring Physician (Allergy and Immunology) Frederik Pear, MD as Consulting Physician (Orthopedic Surgery)   Past Medical History:  Diagnosis Date  . Acute mastitis of left breast 10/04/2007  . AMA (advanced maternal age) multigravida 2+   . Cancer ([place]) 2002   Left leg  . Celiac disease   . Chronic back pain    H/O  . Complication of anesthesia    Difficult time waking up  . Constipation in pregnancy 03/2006  . Endometriosis    h/o  . Environmental allergies   . Fibroid    During first pregnancy  . Fibromyalgia   . First trimester bleeding 2008  . GBS carrier   . GERD (gastroesophageal reflux disease)   . H/O amenorrhea 10/2006  . H/O varicella    As an infant.  . Headache(784.0)   . Hearing loss 08/15/2016  . Heart murmur   . IBS (irritable bowel syndrome) 08/15/2016  . Irregular uterine bleeding   . Leg pain, left 02/09/2016  . Monilial vaginitis 07/27/04  . Rh negative status during pregnancy   . SAB (spontaneous abortion)    h/o x 2  . Seasonal allergies   . Sleep apnea 08/15/2016  . TMJ syndrome   . Vegetarianism 08/15/2016  . Vitamin D deficiency 08/16/2015    Past  Surgical History:  Procedure Laterality Date  . ABDOMINAL HYSTERECTOMY  2013  . APPENDECTOMY  2002  . DILATION AND EVACUATION    . endocervical polyp removal  2001  . ENDOMETRIAL ABLATION  2010  . HERNIA REPAIR  05/16/12   incisional hernia repair  . HYSTEROSCOPY W/ ENDOMETRIAL ABLATION  01/28/2009  . INSERTION OF MESH N/A 05/16/2012   Procedure: INSERTION OF MESH;  Surgeon: Joyice Faster. Cornett, MD;  Location: [facility];  Service: General;  Laterality: N/A;  . KNEE ARTHROSCOPY Left 12/05/2012   Procedure: LEFT KNEE ARTHROSCOPY;  Surgeon: Kerin Salen, MD;  Location: [facility];  Service: Orthopedics;  Laterality: Left;  [name] MELANOMA EXCISION  2002   Left Leg  . melanoma removal   2003  . SALPINGOOPHORECTOMY  12/08/2011   Procedure: SALPINGO OOPHERECTOMY;  Surgeon: Eldred Manges, MD;  Location: [facility];  Service: Gynecology;  Laterality: Bilateral;  . TENDON RELEASE  2004  . uterine curettage  01/2009  . VENTRAL HERNIA REPAIR N/A 05/16/2012   Procedure: LAPAROSCOPIC VENTRAL HERNIA;  Surgeon: Joyice Faster. Cornett, MD;  Location: [facility];  Service: General;  Laterality: N/A;  . WISDOM TOOTH EXTRACTION  1989    Family History  Problem Relation Age of Onset  . Hypertension Mother   . Cancer Mother        breast  .  Heart disease Father        atrial fib  . Hypertension Father   . Cancer Father        Lyphoma,Bladder  . Atrial fibrillation Father   . Cancer Maternal Grandmother        breast  . Heart disease Maternal Grandfather        MI  . Heart disease Paternal Grandfather        MI  . Cancer Maternal Aunt        ovarian  . Diverticulitis Brother     Social History   Social History  . Marital status: Married    Spouse name: N/A  . Number of children: N/A  . Years of education: N/A   Occupational History  . Not on file.   Social History Main Topics  . Smoking status: Former Smoker    Types: Cigarettes    Quit date: 02/29/1988  . Smokeless tobacco: Never Used    . Alcohol use No  . Drug use: No  . Sexual activity: Yes    Birth control/ protection: Surgical     Comment: BTL   Other Topics Concern  . 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 estradiol (VIVELLE-DOT) 0.05 MG/24HR Place 1 patch onto the skin 2 (two) times a week. Monday and Thursday    . hydrochlorothiazide (HYDRODIURIL) 25 MG tablet TAKE 1 TABLET BY MOUTH DAILY 30 tablet 5  . lisinopril (PRINIVIL,ZESTRIL) 10 MG tablet Take 1 tablet (10 mg total) by mouth 2 (two) times daily. 60 tablet 4  . Multiple Vitamins-Minerals (MULTIVITAMIN ADULT PO) Take by mouth.    [name] albuterol (PROVENTIL HFA;VENTOLIN HFA) 108 (90 Base) MCG/ACT inhaler Inhale 2 puffs into the lungs every 6 (six) hours as needed for wheezing or shortness of breath. (Patient not taking: Reported on 02/09/2016) 1 Inhaler 0  . Beclomethasone Dipropionate (QNASL) 80 MCG/ACT AERS Place 2 Squirts into the nose daily as needed (allergies). Each nostril    . Calcium Carbonate-Vit D-Min (CALCIUM 1200 PO) Take by mouth.    . Vitamin D, Ergocalciferol, (DRISDOL) 50000 UNITS CAPS capsule Take 1 capsule (50,000 Units total) by mouth every 7 (seven) days. (Patient not taking: Reported on 02/09/2016) 12 capsule 0  . cetirizine (ZYRTEC) 10 MG tablet Take 10 mg by mouth daily.    . montelukast (SINGULAIR) 10 MG tablet Take 1 tablet (10 mg total) by mouth at bedtime. 30 tablet 6  . RESTASIS 0.05 % ophthalmic emulsion   1   Facility-Administered Medications Prior to Visit  Medication Dose Route Frequency Provider Last Rate Last Dose  . ipratropium-albuterol (DUONEB) 0.5-2.5 (3) MG/3ML nebulizer solution 3 mL  3 mL Nebulization Q6H Midge Minium, MD        Allergies  Allergen Reactions  . Betadine [Povidone Iodine] Other (See Comments)    Dx with allergy  testing  . Cymbalta [Duloxetine Hcl] Other (See Comments)    Makes her feel crazy, out of body  . Iodine   . Shellfish Allergy Other (See Comments)    Dx with allergy testing    Review of Systems  Constitutional: Positive for malaise/fatigue. Negative for fever.  HENT: Negative for congestion.   Eyes: Negative for blurred vision.  Respiratory: Negative for cough and shortness of breath.  Cardiovascular: Positive for palpitations. Negative for chest pain and leg swelling.  Gastrointestinal: Negative for vomiting.  Musculoskeletal: Positive for myalgias. Negative for back pain.  Skin: Negative for rash.  Neurological: Negative for loss of consciousness and headaches.       Objective:    Physical Exam  Constitutional: She is oriented to person, place, and time. She appears well-developed and well-nourished. No distress.  HENT:  Head: Normocephalic and atraumatic.  Eyes: Conjunctivae are normal.  Neck: Normal range of motion. No thyromegaly present.  Cardiovascular: Normal rate and regular rhythm.   Pulmonary/Chest: Effort normal and breath sounds normal. She has no wheezes.  Abdominal: Soft. Bowel sounds are normal. There is no tenderness.  Musculoskeletal: Normal range of motion. She exhibits no edema or deformity.  Neurological: She is alert and oriented to person, place, and time.  Skin: Skin is warm and dry. She is not diaphoretic.  Psychiatric: She has a normal mood and affect.    BP 98/70 (BP Location: Left Arm, Patient Position: Sitting, Cuff Size: Normal)   Pulse 62   Temp 98 F (36.7 C) (Oral)   Resp 18   Ht 5\' 4"  (1.626 m)   Wt 215 lb (97.5 kg)   LMP 02/22/2011   SpO2 99%   BMI 36.90 kg/m  Wt Readings from Last 3 Encounters:  08/15/16 215 lb (97.5 kg)  02/18/16 220 lb (99.8 kg)  02/09/16 227 lb 9.6 oz (103.2 kg)   BP Readings from Last 3 Encounters:  08/15/16 98/70  02/18/16 130/86  02/09/16 100/70     Immunization History  Administered Date(s)  Administered  . Tdap 10/29/2003, 10/28/2013    Health Maintenance  Topic Date Due  . HIV Screening  12/26/1983  . INFLUENZA VACCINE  09/28/2016  . TETANUS/TDAP  10/29/2023    Lab Results  Component Value Date   WBC 6.6 08/15/2016   HGB 14.9 08/15/2016   HCT 43.6 08/15/2016   PLT 348.0 08/15/2016   GLUCOSE 98 08/15/2016   CHOL 156 09/11/2015   TRIG 89.0 09/11/2015   HDL 51.00 09/11/2015   LDLCALC 88 09/11/2015   ALT 30 08/15/2016   AST 18 08/15/2016   NA 136 08/15/2016   K 4.3 08/15/2016   CL 100 08/15/2016   CREATININE 0.56 08/15/2016   BUN 13 08/15/2016   CO2 30 08/15/2016   TSH 1.05 08/15/2016    Lab Results  Component Value Date   TSH 1.05 08/15/2016   Lab Results  Component Value Date   WBC 6.6 08/15/2016   HGB 14.9 08/15/2016   HCT 43.6 08/15/2016   MCV 86.8 08/15/2016   PLT 348.0 08/15/2016   Lab Results  Component Value Date   NA 136 08/15/2016   K 4.3 08/15/2016   CO2 30 08/15/2016   GLUCOSE 98 08/15/2016   BUN 13 08/15/2016   CREATININE 0.56 08/15/2016   BILITOT 0.6 08/15/2016   ALKPHOS 56 08/15/2016   AST 18 08/15/2016   ALT 30 08/15/2016   PROT 7.0 08/15/2016   ALBUMIN 4.5 08/15/2016   CALCIUM 9.7 08/15/2016   GFR 122.99 08/15/2016   Lab Results  Component Value Date   CHOL 156 09/11/2015   Lab Results  Component Value Date   HDL 51.00 09/11/2015   Lab Results  Component Value Date   LDLCALC 88 09/11/2015   Lab Results  Component Value Date   TRIG 89.0 09/11/2015   Lab Results  Component Value Date   CHOLHDL 3 09/11/2015   No  results found for: HGBA1C       Assessment & Plan:   Problem List Items Addressed This Visit    Essential hypertension    Well controlled, no changes to meds. Encouraged heart healthy diet such as the DASH diet and exercise as tolerated.       Muscle cramps    Increase hydration, try Hyland's leg cramp meds. Check magnesium level      Relevant Orders   Magnesium (Completed)    Palpitations    EKG in 2015 showed PACs and PVCs, she notes palpitations have worsened since her gynecologist prescribes her estrogen patch but her insurance recently refused to pay for the generic and made her go back to the brand name which she thinks makes this worse. Will proceed with EKG today which is normal. She will notify us if worsens      Relevant Orders   CBC (Completed)   Comprehensive metabolic panel (Completed)   TSH (Completed)   Vitamin B12 (Completed)   EKG 12-Lead (Completed)   Obesity    Encouraged DASH diet, decrease po intake and increase exercise as tolerated. Needs 7-8 hours of sleep nightly. Avoid trans fats, eat small, frequent meals every 4-5 hours with lean proteins, complex carbs and healthy fats. Minimize simple carbs, referred to bariatrics      Preventative health care    Patient encouraged to maintain heart healthy diet, regular exercise, adequate sleep. Consider daily probiotics. Take medications as prescribed      Vitamin D deficiency    Daily supplements      Relevant Orders   Vitamin D (25 hydroxy) (Completed)   IBS (irritable bowel syndrome)    Worse with junk food, encouraged probiotics and pancreatic enzymes prn.      Relevant Orders   CBC (Completed)   Lipase (Completed)   Vegetarianism - Primary   Relevant Orders   Vitamin B12 (Completed)   Hearing loss    Per her family, [facility] testing      Sleep apnea    Her dentist Augustina Mood ran a home sleep study which confirmed sleep apnea, they are recommended a mouth guard to manage. She got a gap letter to help get her medical insurance. Will proceed with letter, she should proceed. She snores and struggles with chronic fatigue.       Relevant Orders   Ambulatory referral to Pulmonology      I have discontinued Ms. Greening's cetirizine, RESTASIS, and montelukast. I am also having her maintain her Beclomethasone Dipropionate, estradiol, Calcium Carbonate-Vit D-Min  (CALCIUM 1200 PO), Multiple Vitamins-Minerals (MULTIVITAMIN ADULT PO), Vitamin D (Ergocalciferol), albuterol, lisinopril, and hydrochlorothiazide. We will continue to administer ipratropium-albuterol.  No orders of the defined types were placed in this encounter.   CMA served as Education administrator during this visit. History, Physical and Plan performed by medical provider. Documentation and orders reviewed and attested to.  Natalie Homans, MD

## 2016-08-15 NOTE — Assessment & Plan Note (Addendum)
EKG in 2015 showed PACs and PVCs, she notes palpitations have worsened since her gynecologist prescribes her estrogen patch but her insurance recently refused to pay for the generic and made her go back to the brand name which she thinks makes this worse. Will proceed with EKG today which is normal. She will notify us if worsens

## 2016-08-15 NOTE — Assessment & Plan Note (Signed)
Well controlled, no changes to meds. Encouraged heart healthy diet such as the DASH diet and exercise as tolerated.  °

## 2016-08-15 NOTE — Patient Instructions (Addendum)
Hyland's leg cramp med fir keg cramp med, 64 oz of clear fluids  [facility] hearing test  Preventive Care 40-64 Years, Female Preventive care refers to lifestyle choices and visits with your health care provider that can promote health and wellness. What does preventive care include?  A yearly physical exam. This is also called an annual well check.  Dental exams once or twice a year.  Routine eye exams. Ask your health care provider how often you should have your eyes checked.  Personal lifestyle choices, including: ? Daily care of your teeth and gums. ? Regular physical activity. ? Eating a healthy diet. ? Avoiding tobacco and drug use. ? Limiting alcohol use. ? Practicing safe sex. ? Taking low-dose aspirin daily starting at age 75. ? Taking vitamin and mineral supplements as recommended by your health care provider. What happens during an annual well check? The services and screenings done by your health care provider during your annual well check will depend on your age, overall health, lifestyle risk factors, and family history of disease. Counseling Your health care provider may ask you questions about your:  Alcohol use.  Tobacco use.  Drug use.  Emotional well-being.  Home and relationship well-being.  Sexual activity.  Eating habits.  Work and work Statistician.  Method of birth control.  Menstrual cycle.  Pregnancy history.  Screening You may have the following tests or measurements:  Height, weight, and BMI.  Blood pressure.  Lipid and cholesterol levels. These may be checked every 5 years, or more frequently if you are over 53 years old.  Skin check.  Lung cancer screening. You may have this screening every year starting at age 82 if you have a 30-pack-year history of smoking and currently smoke or have quit within the past 15 years.  Fecal occult blood test (FOBT) of the stool. You may have this test every year starting at age 37.  Flexible  sigmoidoscopy or colonoscopy. You may have a sigmoidoscopy every 5 years or a colonoscopy every 10 years starting at age 25.  Hepatitis C blood test.  Hepatitis B blood test.  Sexually transmitted disease (STD) testing.  Diabetes screening. This is done by checking your blood sugar (glucose) after you have not eaten for a while (fasting). You may have this done every 1-3 years.  Mammogram. This may be done every 1-2 years. Talk to your health care provider about when you should start having regular mammograms. This may depend on whether you have a family history of breast cancer.  BRCA-related cancer screening. This may be done if you have a family history of breast, ovarian, tubal, or peritoneal cancers.  Pelvic exam and Pap test. This may be done every 3 years starting at age 57. Starting at age 74, this may be done every 5 years if you have a Pap test in combination with an HPV test.  Bone density scan. This is done to screen for osteoporosis. You may have this scan if you are at high risk for osteoporosis.  Discuss your test results, treatment options, and if necessary, the need for more tests with your health care provider. Vaccines Your health care provider may recommend certain vaccines, such as:  Influenza vaccine. This is recommended every year.  Tetanus, diphtheria, and acellular pertussis (Tdap, Td) vaccine. You may need a Td booster every 10 years.  Varicella vaccine. You may need this if you have not been vaccinated.  Zoster vaccine. You may need this after age 26.  Measles, mumps,  and rubella (MMR) vaccine. You may need at least one dose of MMR if you were born in 1957 or later. You may also need a second dose.  Pneumococcal 13-valent conjugate (PCV13) vaccine. You may need this if you have certain conditions and were not previously vaccinated.  Pneumococcal polysaccharide (PPSV23) vaccine. You may need one or two doses if you smoke cigarettes or if you have certain  conditions.  Meningococcal vaccine. You may need this if you have certain conditions.  Hepatitis A vaccine. You may need this if you have certain conditions or if you travel or work in places where you may be exposed to hepatitis A.  Hepatitis B vaccine. You may need this if you have certain conditions or if you travel or work in places where you may be exposed to hepatitis B.  Haemophilus influenzae type b (Hib) vaccine. You may need this if you have certain conditions.  Talk to your health care provider about which screenings and vaccines you need and how often you need them. This information is not intended to replace advice given to you by your health care provider. Make sure you discuss any questions you have with your health care provider. Document Released: 03/13/2015 Document Revised: 11/04/2015 Document Reviewed: 12/16/2014 [name]  2017 [employer].

## 2016-08-15 NOTE — Assessment & Plan Note (Signed)
Increase hydration, try Hyland's leg cramp meds. Check magnesium level

## 2016-08-15 NOTE — Assessment & Plan Note (Signed)
Worse with junk food, encouraged probiotics and pancreatic enzymes prn.

## 2016-08-15 NOTE — Assessment & Plan Note (Signed)
Her dentist [name] ran a home sleep study which confirmed sleep apnea, they are recommended a mouth guard to manage. She got a gap letter to help get her medical insurance. Will proceed with letter, she should proceed. She snores and struggles with chronic fatigue.

## 2016-08-15 NOTE — Assessment & Plan Note (Signed)
Daily supplements 

## 2016-08-15 NOTE — Assessment & Plan Note (Signed)
Patient encouraged to maintain heart healthy diet, regular exercise, adequate sleep. Consider daily probiotics. Take medications as prescribed 

## 2016-08-15 NOTE — Assessment & Plan Note (Signed)
Per her family, [facility] testing

## 2016-08-15 NOTE — Assessment & Plan Note (Signed)
Encouraged DASH diet, decrease po intake and increase exercise as tolerated. Needs 7-8 hours of sleep nightly. Avoid trans fats, eat small, frequent meals every 4-5 hours with lean proteins, complex carbs and healthy fats. Minimize simple carbs, referred to bariatrics 

## 2016-08-16 MED ORDER — VITAMIN D (ERGOCALCIFEROL) 1.25 MG (50000 UNIT) PO CAPS: 50000.0000 [IU] | ORAL_CAPSULE | ORAL | 0 refills | Status: DC

## 2016-08-16 NOTE — Addendum Note (Signed)
Addended byDamita Dunnings D on: 08/16/2016 09:44 AM   Modules accepted: Orders

## 2016-09-05 ENCOUNTER — Encounter: Payer: Self-pay | Admitting: Family Medicine

## 2016-09-06 ENCOUNTER — Institutional Professional Consult (permissible substitution): Payer: 59 | Admitting: Pulmonary Disease

## 2016-09-13 DIAGNOSIS — D2230 Melanocytic nevi of unspecified part of face: Secondary | ICD-10-CM | POA: Diagnosis not present

## 2016-09-13 DIAGNOSIS — L700 Acne vulgaris: Secondary | ICD-10-CM | POA: Diagnosis not present

## 2016-09-13 DIAGNOSIS — D2361 Other benign neoplasm of skin of right upper limb, including shoulder: Secondary | ICD-10-CM | POA: Diagnosis not present

## 2016-09-13 DIAGNOSIS — L920 Granuloma annulare: Secondary | ICD-10-CM | POA: Diagnosis not present

## 2016-09-30 ENCOUNTER — Encounter: Payer: Self-pay | Admitting: Family Medicine

## 2016-09-30 ENCOUNTER — Telehealth: Payer: Self-pay | Admitting: Family Medicine

## 2016-09-30 NOTE — Telephone Encounter (Signed)
Patient has been informed and agreed to do.

## 2016-09-30 NOTE — Telephone Encounter (Signed)
-----   Message from Mosie Lukes, MD sent at 09/30/2016  3:22 PM EDT -----  Let patient know we had sent the referral over to [facility] already but have not heard back from them either. She should contact us again in 1-2 weeks if she still has not heard about the referral for sleep study ----- Message ----- From: Synthia Innocent Sent: 09/30/2016   3:15 PM To: Mosie Lukes, MD  Referral has been sent to [facility], awaiting appt ----- Message ----- From: Mosie Lukes, MD Sent: 09/30/2016   3:06 PM To: Artelia Laroche  Can we move her sleep study elsewhere? Let me know who has space and if it is a different subspecialty and I will change referral

## 2016-10-10 ENCOUNTER — Other Ambulatory Visit: Payer: Self-pay | Admitting: Family Medicine

## 2016-12-02 ENCOUNTER — Institutional Professional Consult (permissible substitution): Payer: 59 | Admitting: Pulmonary Disease

## 2016-12-07 ENCOUNTER — Other Ambulatory Visit: Payer: Self-pay | Admitting: Family Medicine

## 2016-12-21 ENCOUNTER — Other Ambulatory Visit: Payer: Self-pay | Admitting: Family Medicine

## 2016-12-27 ENCOUNTER — Encounter: Payer: Self-pay | Admitting: Pulmonary Disease

## 2016-12-27 ENCOUNTER — Ambulatory Visit (INDEPENDENT_AMBULATORY_CARE_PROVIDER_SITE_OTHER): Payer: 59 | Admitting: Pulmonary Disease

## 2016-12-27 DIAGNOSIS — G4733 Obstructive sleep apnea (adult) (pediatric): Secondary | ICD-10-CM

## 2016-12-27 DIAGNOSIS — Z23 Encounter for immunization: Secondary | ICD-10-CM | POA: Diagnosis not present

## 2016-12-27 NOTE — Progress Notes (Signed)
Subjective:    Patient ID: [name], female    DOB: [birth year], 46 y.o.   MRN: [id number]  HPI  Chief Complaint  Patient presents with  . Sleep Consult    Referred by Dr. Quay Burow for possible sleep apnea. Sleeps with a bite guard at night due to TMJ. Wants a flu shot today.     46 year old woman presents for evaluation of sleep disordered breathing. 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  She has TMJ issues and has used a mouthguard for many years.  She was evaluated by her dentist when she needed a change and underwent a home sleep study in 12/2015 for 2 nights.  This showed an AHI of 24/hour and 17/hour with RDI of 41/hour and 25/hour with lowest desaturation of 93%.  Her weight then was 220 pounds with BMI of 37.  Epworth sleepiness score is 8. She reports bedtime around 11 PM to midnight, sleep latency is minimal, she sleeps on her stomach with one pillow and often rolls over on her back during her sleep, reports 1-2 nocturnal awakenings, denies nocturia and is out of bed at 6:30 AM feeling groggy but denies dryness of mouth or headaches There is no history suggestive of cataplexy, sleep paralysis or parasomnias     Past Medical History:  Diagnosis Date  . Acute mastitis of left breast 10/04/2007  . AMA (advanced maternal age) multigravida 66+   . Cancer ([place]) 2002   Left leg  . Celiac disease   . Chronic back pain    H/O  . Complication of anesthesia    Difficult time waking up  . Constipation in pregnancy 03/2006  . Endometriosis    h/o  . Environmental allergies   . Fibroid    During first pregnancy  . Fibromyalgia   . First trimester bleeding 2008  . GBS carrier   . GERD (gastroesophageal reflux disease)   . H/O amenorrhea 10/2006  . H/O varicella    As an infant.  . Headache(784.0)   . Hearing loss 08/15/2016  . Heart murmur   . IBS (irritable bowel  syndrome) 08/15/2016  . Irregular uterine bleeding   . Leg pain, left 02/09/2016  . Monilial vaginitis 07/27/04  . Rh negative status during pregnancy   . SAB (spontaneous abortion)    h/o x 2  . Seasonal allergies   . Sleep apnea 08/15/2016  . TMJ syndrome   . Vegetarianism 08/15/2016  . Vitamin D deficiency 08/16/2015     Past Surgical History:  Procedure Laterality Date  . ABDOMINAL HYSTERECTOMY  2013  . APPENDECTOMY  2002  . DILATION AND EVACUATION    . endocervical polyp removal  2001  . ENDOMETRIAL ABLATION  2010  . HERNIA REPAIR  05/16/12   incisional hernia repair  . HYSTEROSCOPY W/ ENDOMETRIAL ABLATION  01/28/2009  . INSERTION OF MESH N/A 05/16/2012   Procedure: INSERTION OF MESH;  Surgeon: Joyice Faster. Cornett, MD;  Location: [facility];  Service: General;  Laterality: N/A;  . KNEE ARTHROSCOPY Left 12/05/2012   Procedure: LEFT KNEE ARTHROSCOPY;  Surgeon: Kerin Salen, MD;  Location: [facility];  Service: Orthopedics;  Laterality: Left;  [name] MELANOMA EXCISION  2002   Left Leg  . melanoma removal   2003  . SALPINGOOPHORECTOMY  12/08/2011   Procedure: SALPINGO OOPHERECTOMY;  Surgeon: Eldred Manges, MD;  Location: [facility];  Service: Gynecology;  Laterality: Bilateral;  . TENDON RELEASE  2004  . uterine curettage  01/2009  . VENTRAL HERNIA REPAIR N/A 05/16/2012   Procedure: LAPAROSCOPIC VENTRAL HERNIA;  Surgeon: Joyice Faster. Cornett, MD;  Location: [facility];  Service: General;  Laterality: N/A;  . WISDOM TOOTH EXTRACTION  1989    Allergies  Allergen Reactions  . Betadine [Povidone Iodine] Other (See Comments)    Dx with allergy testing  . Cymbalta [Duloxetine Hcl] Other (See Comments)    Makes her feel crazy, out of body  . Iodine   . Shellfish Allergy Other (See Comments)    Dx with allergy testing       Social History   Social History  . Marital status: Married    Spouse name: N/A  . Number of children: N/A  . Years of education: N/A   Occupational  History  . Not on file.   Social History Main Topics  . Smoking status: Former Smoker    Types: Cigarettes    Quit date: 02/29/1988  . Smokeless tobacco: Never Used  . Alcohol use No  . Drug use: No  . Sexual activity: Yes    Birth control/ protection: Surgical     Comment: BTL   Other Topics Concern  . Not on file   Social History Narrative   Lives with husband and 3 children   Owns a Animal nutritionist hospital   Vegetarian   Wears seat belt      Family History  Problem Relation Age of Onset  . Hypertension Mother   . Cancer Mother        breast  . Heart disease Father        atrial fib  . Hypertension Father   . Cancer Father        Lyphoma,Bladder  . Atrial fibrillation Father   . Cancer Maternal Grandmother        breast  . Heart disease Maternal Grandfather        MI  . Heart disease Paternal Grandfather        MI  . Cancer Maternal Aunt        ovarian  . Diverticulitis Brother      Review of Systems  Positive for irregular heartbeat on occasion, acid heartburn  Constitutional: negative for anorexia, fevers and sweats  Eyes: negative for irritation, redness and visual disturbance  Ears, nose, mouth, throat, and face: negative for earaches, epistaxis, nasal congestion and sore throat  Respiratory: negative for cough, dyspnea on exertion, sputum and wheezing  Cardiovascular: negative for chest pain, dyspnea, lower extremity edema, orthopnea, palpitations and syncope  Gastrointestinal: negative for abdominal pain, constipation, diarrhea, melena, nausea and vomiting  Genitourinary:negative for dysuria, frequency and hematuria  Hematologic/lymphatic: negative for bleeding, easy bruising and lymphadenopathy  Musculoskeletal:negative for arthralgias, muscle weakness and stiff joints  Neurological: negative for coordination problems, gait problems, headaches and weakness  Endocrine: negative for diabetic symptoms including polydipsia, polyuria and weight loss      Objective:   Physical Exam  Gen. Pleasant, obese, in no distress, normal affect ENT - large tonsils, no post nasal drip, class 2-3 airway Neck: No JVD, no thyromegaly, no carotid bruits Lungs: no use of accessory muscles, no dullness to percussion, decreased without rales or rhonchi  Cardiovascular: Rhythm regular, heart sounds  normal, no murmurs or gallops, no peripheral edema Abdomen: soft and non-tender, no hepatosplenomegaly, BS normal. Musculoskeletal: No deformities, no cyanosis or clubbing Neuro:  alert, non focal,  no tremors       Assessment & Plan:

## 2016-12-27 NOTE — Assessment & Plan Note (Signed)
She does seem to have moderate to severe OSA based on home sleep study for 2 nights.  Oxygen desaturations are mild. It would be reasonable to proceed with oral appliance.  Key here would be to repeat home sleep study after dental appliance has been adjusted/advanced to ensure that events have been corrected. Her weight and enlarged tonsils or other risk factors, she would certainly have a better response to oral appliance if she is able to lose even 10-15 pounds   The pathophysiology of obstructive sleep apnea , it's cardiovascular consequences & modes of treatment including CPAP were discused with the patient in detail & they evidenced understanding.

## 2016-12-27 NOTE — Assessment & Plan Note (Signed)
Weight loss encouraged 

## 2016-12-27 NOTE — Patient Instructions (Addendum)
Flu shot We will send letter to Dr Toy Cookey Repeat home study after dental device adjusted

## 2017-01-10 ENCOUNTER — Other Ambulatory Visit: Payer: Self-pay | Admitting: Family Medicine

## 2017-02-16 ENCOUNTER — Ambulatory Visit: Payer: 59 | Admitting: Family Medicine

## 2017-02-22 ENCOUNTER — Other Ambulatory Visit: Payer: Self-pay | Admitting: Family Medicine

## 2017-03-02 ENCOUNTER — Encounter: Payer: Self-pay | Admitting: Family Medicine

## 2017-03-02 ENCOUNTER — Ambulatory Visit: Payer: 59 | Admitting: Family Medicine

## 2017-03-02 DIAGNOSIS — E785 Hyperlipidemia, unspecified: Secondary | ICD-10-CM | POA: Insufficient documentation

## 2017-03-02 DIAGNOSIS — I10 Essential (primary) hypertension: Secondary | ICD-10-CM

## 2017-03-02 DIAGNOSIS — M546 Pain in thoracic spine: Secondary | ICD-10-CM

## 2017-03-02 DIAGNOSIS — E559 Vitamin D deficiency, unspecified: Secondary | ICD-10-CM | POA: Diagnosis not present

## 2017-03-02 DIAGNOSIS — G8929 Other chronic pain: Secondary | ICD-10-CM | POA: Diagnosis not present

## 2017-03-02 DIAGNOSIS — R002 Palpitations: Secondary | ICD-10-CM | POA: Diagnosis not present

## 2017-03-02 DIAGNOSIS — K219 Gastro-esophageal reflux disease without esophagitis: Secondary | ICD-10-CM | POA: Diagnosis not present

## 2017-03-02 HISTORY — DX: Hyperlipidemia, unspecified: E78.5

## 2017-03-02 LAB — COMPREHENSIVE METABOLIC PANEL
ALT: 35 U/L (ref 0–35)
AST: 24 U/L (ref 0–37)
Albumin: 4.5 g/dL (ref 3.5–5.2)
Alkaline Phosphatase: 60 U/L (ref 39–117)
BILIRUBIN TOTAL: 0.6 mg/dL (ref 0.2–1.2)
BUN: 14 mg/dL (ref 6–23)
CHLORIDE: 102 meq/L (ref 96–112)
CO2: 31 meq/L (ref 19–32)
CREATININE: 0.6 mg/dL (ref 0.40–1.20)
Calcium: 10.1 mg/dL (ref 8.4–10.5)
GFR: 113.32 mL/min (ref >60.00)
Glucose, Bld: 105 mg/dL — ABNORMAL HIGH (ref 70–99)
Potassium: 5.1 mEq/L (ref 3.5–5.1)
SODIUM: 141 meq/L (ref 135–145)
Total Protein: 7.2 g/dL (ref 6.0–8.3)

## 2017-03-02 LAB — CBC
HCT: 45.3 % (ref 36.0–46.0)
HEMOGLOBIN: 15.1 g/dL — AB (ref 12.0–15.0)
MCHC: 33.4 g/dL (ref 30.0–36.0)
MCV: 88.7 fl (ref 78.0–100.0)
PLATELETS: 364 10*3/uL (ref 150.0–400.0)
RBC: 5.1 Mil/uL (ref 3.87–5.11)
RDW: 13.9 % (ref 11.5–15.5)
WBC: 7 10*3/uL (ref 4.0–10.5)

## 2017-03-02 LAB — LIPID PANEL
CHOLESTEROL: 177 mg/dL (ref 0–200)
HDL: 50.3 mg/dL (ref >39.00)
LDL CALC: 105 mg/dL — AB (ref 0–99)
NonHDL: 126.32
TRIGLYCERIDES: 106 mg/dL (ref 0.0–149.0)
Total CHOL/HDL Ratio: 4
VLDL: 21.2 mg/dL (ref 0.0–40.0)

## 2017-03-02 LAB — TSH: TSH: 1.79 u[IU]/mL (ref 0.35–4.50)

## 2017-03-02 LAB — VITAMIN D 25 HYDROXY (VIT D DEFICIENCY, FRACTURES): VITD: 40.56 ng/mL (ref 30.00–100.00)

## 2017-03-02 MED ORDER — LISINOPRIL 10 MG PO TABS: 10.0000 mg | ORAL_TABLET | Freq: Two times a day (BID) | ORAL | 1 refills | Status: DC

## 2017-03-02 MED ORDER — HYDROCHLOROTHIAZIDE 25 MG PO TABS: 25.0000 mg | ORAL_TABLET | Freq: Every day | ORAL | 3 refills | Status: DC

## 2017-03-02 NOTE — Assessment & Plan Note (Signed)
Very infrequent since she has gotten used to the new estrogen weekly patch her insurance forced her to switch. She had one episode this am without associated symptoms.

## 2017-03-02 NOTE — Assessment & Plan Note (Signed)
Encouraged DASH diet, decrease po intake and increase exercise as tolerated. Needs 7-8 hours of sleep nightly. Avoid trans fats, eat small, frequent meals every 4-5 hours with lean proteins, complex carbs and healthy fats. Minimize simple carbs, consider bariatric referral 

## 2017-03-02 NOTE — Progress Notes (Signed)
Subjective:  I acted as a Education administrator for Dr. Charlett Blake. [place], [place]  Patient ID: [name], female    DOB: August 23, 1970, 47 y.o.   MRN: [id number]  No chief complaint on file.   HPI  Patient is in today for a 6 mont follow up and overall she is doing well. No recent febrile illness or acute hospitalizations. Is taking her vitamin D daily and takes a weekly dose most weeks. Is trying to make a great deal of dietary changes. Continues to struggle with upset stomach and diarrhea in the am. When she eats better it is better. Denies CP/SOB/HA/congestion/fevers or GU c/o. Taking meds as prescribed. Takes a probiotic most days. Had one episode of palpitations this am without association. Is trying to pursue an oral appliance for her sleep apnea. But her insurance is refusing to let her dentist make her appliance they are trying to force her to go see someone who is not certified. She is fighting with them. Continues to struggle with back and shoulder pain daily  Patient Care Team: Mosie Lukes, MD as PCP - General (Family Medicine) Leo Grosser, Seymour Bars, MD as Consulting Physician (Obstetrics and Gynecology) Tiajuana Amass, MD as Referring Physician (Allergy and Immunology) Frederik Pear, MD as Consulting Physician (Orthopedic Surgery)   Past Medical History:  Diagnosis Date  . Acute mastitis of left breast 10/04/2007  . AMA (advanced maternal age) multigravida 82+   . Cancer ([place]) 2002   Left leg  . Celiac disease   . Chronic back pain    H/O  . Complication of anesthesia    Difficult time waking up  . Constipation in pregnancy 03/2006  . Endometriosis    h/o  . Environmental allergies   . Fibroid    During first pregnancy  . Fibromyalgia   . First trimester bleeding 2008  . GBS carrier   . GERD (gastroesophageal reflux disease)   . H/O amenorrhea 10/2006  . H/O varicella    As an infant.  . Headache(784.0)   . Hearing loss 08/15/2016  . Heart murmur   . Hyperlipidemia 03/02/2017  . IBS  (irritable bowel syndrome) 08/15/2016  . Irregular uterine bleeding   . Leg pain, left 02/09/2016  . Monilial vaginitis 07/27/04  . Rh negative status during pregnancy   . SAB (spontaneous abortion)    h/o x 2  . Seasonal allergies   . Sleep apnea 08/15/2016  . TMJ syndrome   . Vegetarianism 08/15/2016  . Vitamin D deficiency 08/16/2015    Past Surgical History:  Procedure Laterality Date  . ABDOMINAL HYSTERECTOMY  2013  . APPENDECTOMY  2002  . DILATION AND EVACUATION    . endocervical polyp removal  2001  . ENDOMETRIAL ABLATION  2010  . HERNIA REPAIR  05/16/12   incisional hernia repair  . HYSTEROSCOPY W/ ENDOMETRIAL ABLATION  01/28/2009  . INSERTION OF MESH N/A 05/16/2012   Procedure: INSERTION OF MESH;  Surgeon: Joyice Faster. Cornett, MD;  Location: [facility];  Service: General;  Laterality: N/A;  . KNEE ARTHROSCOPY Left 12/05/2012   Procedure: LEFT KNEE ARTHROSCOPY;  Surgeon: Kerin Salen, MD;  Location: [facility];  Service: Orthopedics;  Laterality: Left;  [name] MELANOMA EXCISION  2002   Left Leg  . melanoma removal   2003  . SALPINGOOPHORECTOMY  12/08/2011   Procedure: SALPINGO OOPHERECTOMY;  Surgeon: Eldred Manges, MD;  Location: [facility];  Service: Gynecology;  Laterality: Bilateral;  . TENDON RELEASE  2004  .  uterine curettage  01/2009  . VENTRAL HERNIA REPAIR N/A 05/16/2012   Procedure: LAPAROSCOPIC VENTRAL HERNIA;  Surgeon: Joyice Faster. Cornett, MD;  Location: [facility];  Service: General;  Laterality: N/A;  . WISDOM TOOTH EXTRACTION  1989    Family History  Problem Relation Age of Onset  . Hypertension Mother   . Cancer Mother        breast  . Heart disease Father        atrial fib  . Hypertension Father   . Cancer Father        Lyphoma,Bladder  . Atrial fibrillation Father   . Cancer Maternal Grandmother        breast  . Heart disease Maternal Grandfather        MI  . Heart disease Paternal Grandfather        MI  . Cancer Maternal Aunt        ovarian  .  Diverticulitis Brother     Social History   Socioeconomic History  . Marital status: Married    Spouse name: Not on file  . Number of children: Not on file  . Years of education: Not on file  . Highest education level: Not on file  Social Needs  . Financial resource strain: Not on file  . Food insecurity - worry: Not on file  . Food insecurity - inability: Not on file  . Transportation needs - medical: Not on file  . Transportation needs - non-medical: Not on file  Occupational History  . Not on file  Tobacco Use  . Smoking status: Former Smoker    Types: Cigarettes    Last attempt to quit: 02/29/1988    Years since quitting: 29.0  . Smokeless tobacco: Never Used  Substance and Sexual Activity  . Alcohol use: No  . Drug use: No  . Sexual activity: Yes    Birth control/protection: Surgical    Comment: BTL  Other Topics Concern  . Not on file  Social History Narrative   Lives with husband and 3 children   Owns a Animal nutritionist hospital   Vegetarian   Wears seat belt    Outpatient Medications Prior to Visit  Medication Sig Dispense Refill  . Calcium Carbonate-Vit D-Min (CALCIUM 1200 PO) Take by mouth.    . estradiol (VIVELLE-DOT) 0.05 MG/24HR Place 1 patch onto the skin 2 (two) times a week. Monday and Thursday    . Multiple Vitamins-Minerals (MULTIVITAMIN ADULT PO) Take by mouth.    . Vitamin D, Ergocalciferol, (DRISDOL) 50000 units CAPS capsule TAKE 1 CAPSULE BY MOUTH EVERY 7 DAYS 12 capsule 1  . hydrochlorothiazide (HYDRODIURIL) 25 MG tablet Take 1 tablet (25 mg total) by mouth daily. 30 tablet 0  . lisinopril (PRINIVIL,ZESTRIL) 10 MG tablet TAKE 1 TABLET BY MOUTH TWICE DAILY 60 tablet 1  . ipratropium-albuterol (DUONEB) 0.5-2.5 (3) MG/3ML nebulizer solution 3 mL      No facility-administered medications prior to visit.     Allergies  Allergen Reactions  . Betadine [Povidone Iodine] Other (See Comments)    Dx with allergy testing  . Cymbalta [Duloxetine Hcl] Other  (See Comments)    Makes her feel crazy, out of body  . Iodine   . Shellfish Allergy Other (See Comments)    Dx with allergy testing    Review of Systems  Constitutional: Negative for fever and malaise/fatigue.  HENT: Negative for congestion.   Eyes: Negative for blurred vision.  Respiratory: Negative for shortness of breath.  Cardiovascular: Negative for chest pain, palpitations and leg swelling.  Gastrointestinal: Negative for abdominal pain, blood in stool and nausea.  Genitourinary: Negative for dysuria and frequency.  Musculoskeletal: Negative for falls.  Skin: Negative for rash.  Neurological: Negative for dizziness, loss of consciousness and headaches.  Endo/Heme/Allergies: Negative for environmental allergies.  Psychiatric/Behavioral: Negative for depression. The patient is not nervous/anxious.        Objective:    Physical Exam  Constitutional: She is oriented to person, place, and time. She appears well-developed and well-nourished. No distress.  HENT:  Head: Normocephalic and atraumatic.  Nose: Nose normal.  Eyes: Right eye exhibits no discharge. Left eye exhibits no discharge.  Neck: Normal range of motion. Neck supple.  Cardiovascular: Normal rate and regular rhythm.  No murmur heard. Pulmonary/Chest: Effort normal and breath sounds normal.  Abdominal: Soft. Bowel sounds are normal. There is no tenderness.  Musculoskeletal: She exhibits no edema.  Neurological: She is alert and oriented to person, place, and time.  Skin: Skin is warm and dry.  Psychiatric: She has a normal mood and affect.  Nursing note and vitals reviewed.   BP 126/84 (BP Location: Left Arm, Patient Position: Sitting, Cuff Size: Normal)   Pulse 71   Temp 98.1 F (36.7 C) (Oral)   Resp 18   Wt 217 lb 9.6 oz (98.7 kg)   LMP 02/22/2011   SpO2 98%   BMI 37.35 kg/m  Wt Readings from Last 3 Encounters:  03/02/17 217 lb 9.6 oz (98.7 kg)  12/27/16 215 lb (97.5 kg)  08/15/16 215 lb (97.5  kg)   BP Readings from Last 3 Encounters:  03/02/17 126/84  12/27/16 118/82  08/15/16 98/70     Immunization History  Administered Date(s) Administered  . Influenza,inj,Quad PF,6+ Mos 12/27/2016  . Tdap 10/29/2003, 10/28/2013    Health Maintenance  Topic Date Due  . HIV Screening  12/26/1983  . TETANUS/TDAP  10/29/2023  . INFLUENZA VACCINE  Completed    Lab Results  Component Value Date   WBC 6.6 08/15/2016   HGB 14.9 08/15/2016   HCT 43.6 08/15/2016   PLT 348.0 08/15/2016   GLUCOSE 98 08/15/2016   CHOL 156 09/11/2015   TRIG 89.0 09/11/2015   HDL 51.00 09/11/2015   LDLCALC 88 09/11/2015   ALT 30 08/15/2016   AST 18 08/15/2016   NA 136 08/15/2016   K 4.3 08/15/2016   CL 100 08/15/2016   CREATININE 0.56 08/15/2016   BUN 13 08/15/2016   CO2 30 08/15/2016   TSH 1.05 08/15/2016    Lab Results  Component Value Date   TSH 1.05 08/15/2016   Lab Results  Component Value Date   WBC 6.6 08/15/2016   HGB 14.9 08/15/2016   HCT 43.6 08/15/2016   MCV 86.8 08/15/2016   PLT 348.0 08/15/2016   Lab Results  Component Value Date   NA 136 08/15/2016   K 4.3 08/15/2016   CO2 30 08/15/2016   GLUCOSE 98 08/15/2016   BUN 13 08/15/2016   CREATININE 0.56 08/15/2016   BILITOT 0.6 08/15/2016   ALKPHOS 56 08/15/2016   AST 18 08/15/2016   ALT 30 08/15/2016   PROT 7.0 08/15/2016   ALBUMIN 4.5 08/15/2016   CALCIUM 9.7 08/15/2016   GFR 122.99 08/15/2016   Lab Results  Component Value Date   CHOL 156 09/11/2015   Lab Results  Component Value Date   HDL 51.00 09/11/2015   Lab Results  Component Value Date   LDLCALC 88 09/11/2015  Lab Results  Component Value Date   TRIG 89.0 09/11/2015   Lab Results  Component Value Date   CHOLHDL 3 09/11/2015   No results found for: HGBA1C       Assessment & Plan:   Problem List Items Addressed This Visit    Essential hypertension    Well controlled, no changes to meds. Encouraged heart healthy diet such as the DASH  diet and exercise as tolerated.       Relevant Medications   lisinopril (PRINIVIL,ZESTRIL) 10 MG tablet   hydrochlorothiazide (HYDRODIURIL) 25 MG tablet   Other Relevant Orders   Comprehensive metabolic panel   TSH   CBC   Back pain    Constant upper back and shoulder/neck pain secondary to pendulous breasts. Also notes low back pain Encouraged moist heat and gentle stretching as tolerated. May try NSAIDs and prescription meds as directed and report if symptoms worsen or seek immediate care      GERD (gastroesophageal reflux disease)    Avoid offending foods, start probiotics. Do not eat large meals in late evening and consider raising head of bed.       Palpitations    Very infrequent since she has gotten used to the new estrogen weekly patch her insurance forced her to switch. She had one episode this am without associated symptoms.      Obesity    Encouraged DASH diet, decrease po intake and increase exercise as tolerated. Needs 7-8 hours of sleep nightly. Avoid trans fats, eat small, frequent meals every 4-5 hours with lean proteins, complex carbs and healthy fats. Minimize simple carbs, consider bariatric referral      Vitamin D deficiency    Check level. Continue supplements.       Relevant Orders   VITAMIN D 25 Hydroxy (Vit-D Deficiency, Fractures)   Hyperlipidemia    Very mild historically, will recheck a level today.      Relevant Medications   lisinopril (PRINIVIL,ZESTRIL) 10 MG tablet   hydrochlorothiazide (HYDRODIURIL) 25 MG tablet   Other Relevant Orders   Lipid panel      I have changed [name]'s lisinopril. I am also having her maintain her estradiol, Calcium Carbonate-Vit D-Min (CALCIUM 1200 PO), Multiple Vitamins-Minerals (MULTIVITAMIN ADULT PO), Vitamin D (Ergocalciferol), and hydrochlorothiazide. We will stop administering ipratropium-albuterol.  Meds ordered this encounter  Medications  . lisinopril (PRINIVIL,ZESTRIL) 10 MG tablet    Sig:  Take 1 tablet (10 mg total) by mouth 2 (two) times daily.    Dispense:  60 tablet    Refill:  1  . hydrochlorothiazide (HYDRODIURIL) 25 MG tablet    Sig: Take 1 tablet (25 mg total) by mouth daily.    Dispense:  30 tablet    Refill:  3    CMA served as scribe during this visit. History, Physical and Plan performed by medical provider. Documentation and orders reviewed and attested to.  Penni Homans, MD

## 2017-03-02 NOTE — Assessment & Plan Note (Signed)
Constant upper back and shoulder/neck pain secondary to pendulous breasts. Also notes low back pain Encouraged moist heat and gentle stretching as tolerated. May try NSAIDs and prescription meds as directed and report if symptoms worsen or seek immediate care

## 2017-03-02 NOTE — Assessment & Plan Note (Signed)
Avoid offending foods, start probiotics. Do not eat large meals in late evening and consider raising head of bed.  

## 2017-03-02 NOTE — Assessment & Plan Note (Signed)
Check level. Continue supplements.

## 2017-03-02 NOTE — Assessment & Plan Note (Signed)
Very mild historically, will recheck a level today.

## 2017-03-02 NOTE — Assessment & Plan Note (Signed)
Well controlled, no changes to meds. Encouraged heart healthy diet such as the DASH diet and exercise as tolerated.  °

## 2017-03-02 NOTE — Patient Instructions (Signed)
DASH Eating Plan DASH stands for "Dietary Approaches to Stop Hypertension." The DASH eating plan is a healthy eating plan that has been shown to reduce high blood pressure (hypertension). It may also reduce your risk for type 2 diabetes, heart disease, and stroke. The DASH eating plan may also help with weight loss. What are tips for following this plan? General guidelines  Avoid eating more than 2,300 mg (milligrams) of salt (sodium) a day. If you have hypertension, you may need to reduce your sodium intake to 1,500 mg a day.  Limit alcohol intake to no more than 1 drink a day for nonpregnant women and 2 drinks a day for men. One drink equals 12 oz of beer, 5 oz of wine, or 1 oz of hard liquor.  Work with your health care provider to maintain a healthy body weight or to lose weight. Ask what an ideal weight is for you.  Get at least 30 minutes of exercise that causes your heart to beat faster (aerobic exercise) most days of the week. Activities may include walking, swimming, or biking.  Work with your health care provider or diet and nutrition specialist (dietitian) to adjust your eating plan to your individual calorie needs. Reading food labels  Check food labels for the amount of sodium per serving. Choose foods with less than 5 percent of the Daily Value of sodium. Generally, foods with less than 300 mg of sodium per serving fit into this eating plan.  To find whole grains, look for the word "whole" as the first word in the ingredient list. Shopping  Buy products labeled as "low-sodium" or "no salt added."  Buy fresh foods. Avoid canned foods and premade or frozen meals. Cooking  Avoid adding salt when cooking. Use salt-free seasonings or herbs instead of table salt or sea salt. Check with your health care provider or pharmacist before using salt substitutes.  Do not fry foods. Cook foods using healthy methods such as baking, boiling, grilling, and broiling instead.  Cook with  heart-healthy oils, such as olive, canola, soybean, or sunflower oil. Meal planning   Eat a balanced diet that includes: ? 5 or more servings of fruits and vegetables each day. At each meal, try to fill half of your plate with fruits and vegetables. ? Up to 6-8 servings of whole grains each day. ? Less than 6 oz of lean meat, poultry, or fish each day. A 3-oz serving of meat is about the same size as a deck of cards. One egg equals 1 oz. ? 2 servings of low-fat dairy each day. ? A serving of nuts, seeds, or beans 5 times each week. ? Heart-healthy fats. Healthy fats called Omega-3 fatty acids are found in foods such as flaxseeds and coldwater fish, like sardines, salmon, and mackerel.  Limit how much you eat of the following: ? Canned or prepackaged foods. ? Food that is high in trans fat, such as fried foods. ? Food that is high in saturated fat, such as fatty meat. ? Sweets, desserts, sugary drinks, and other foods with added sugar. ? Full-fat dairy products.  Do not salt foods before eating.  Try to eat at least 2 vegetarian meals each week.  Eat more home-cooked food and less restaurant, buffet, and fast food.  When eating at a restaurant, ask that your food be prepared with less salt or no salt, if possible. What foods are recommended? The items listed may not be a complete list. Talk with your dietitian about what   dietary choices are best for you. Grains Whole-grain or whole-wheat bread. Whole-grain or whole-wheat pasta. Brown rice. Oatmeal. Quinoa. Bulgur. Whole-grain and low-sodium cereals. Pita bread. Low-fat, low-sodium crackers. Whole-wheat flour tortillas. Vegetables Fresh or frozen vegetables (raw, steamed, roasted, or grilled). Low-sodium or reduced-sodium tomato and vegetable juice. Low-sodium or reduced-sodium tomato sauce and tomato paste. Low-sodium or reduced-sodium canned vegetables. Fruits All fresh, dried, or frozen fruit. Canned fruit in natural juice (without  added sugar). Meat and other protein foods Skinless chicken or turkey. Ground chicken or turkey. Pork with fat trimmed off. Fish and seafood. Egg whites. Dried beans, peas, or lentils. Unsalted nuts, nut butters, and seeds. Unsalted canned beans. Lean cuts of beef with fat trimmed off. Low-sodium, lean deli meat. Dairy Low-fat (1%) or fat-free (skim) milk. Fat-free, low-fat, or reduced-fat cheeses. Nonfat, low-sodium ricotta or cottage cheese. Low-fat or nonfat yogurt. Low-fat, low-sodium cheese. Fats and oils Soft margarine without trans fats. Vegetable oil. Low-fat, reduced-fat, or light mayonnaise and salad dressings (reduced-sodium). Canola, safflower, olive, soybean, and sunflower oils. Avocado. Seasoning and other foods Herbs. Spices. Seasoning mixes without salt. Unsalted popcorn and pretzels. Fat-free sweets. What foods are not recommended? The items listed may not be a complete list. Talk with your dietitian about what dietary choices are best for you. Grains Baked goods made with fat, such as croissants, muffins, or some breads. Dry pasta or rice meal packs. Vegetables Creamed or fried vegetables. Vegetables in a cheese sauce. Regular canned vegetables (not low-sodium or reduced-sodium). Regular canned tomato sauce and paste (not low-sodium or reduced-sodium). Regular tomato and vegetable juice (not low-sodium or reduced-sodium). Pickles. Olives. Fruits Canned fruit in a light or heavy syrup. Fried fruit. Fruit in cream or butter sauce. Meat and other protein foods Fatty cuts of meat. Ribs. Fried meat. Bacon. Sausage. Bologna and other processed lunch meats. Salami. Fatback. Hotdogs. Bratwurst. Salted nuts and seeds. Canned beans with added salt. Canned or smoked fish. Whole eggs or egg yolks. Chicken or turkey with skin. Dairy Whole or 2% milk, cream, and half-and-half. Whole or full-fat cream cheese. Whole-fat or sweetened yogurt. Full-fat cheese. Nondairy creamers. Whipped toppings.  Processed cheese and cheese spreads. Fats and oils Butter. Stick margarine. Lard. Shortening. Ghee. Bacon fat. Tropical oils, such as coconut, palm kernel, or palm oil. Seasoning and other foods Salted popcorn and pretzels. Onion salt, garlic salt, seasoned salt, table salt, and sea salt. Worcestershire sauce. Tartar sauce. Barbecue sauce. Teriyaki sauce. Soy sauce, including reduced-sodium. Steak sauce. Canned and packaged gravies. Fish sauce. Oyster sauce. Cocktail sauce. Horseradish that you find on the shelf. Ketchup. Mustard. Meat flavorings and tenderizers. Bouillon cubes. Hot sauce and Tabasco sauce. Premade or packaged marinades. Premade or packaged taco seasonings. Relishes. Regular salad dressings. Where to find more information:  National Heart, [employer], and [employer] Institute: www.nhlbi.nih.gov  American Heart Association: www.heart.org Summary  The DASH eating plan is a healthy eating plan that has been shown to reduce high blood pressure (hypertension). It may also reduce your risk for type 2 diabetes, heart disease, and stroke.  With the DASH eating plan, you should limit salt (sodium) intake to 2,300 mg a day. If you have hypertension, you may need to reduce your sodium intake to 1,500 mg a day.  When on the DASH eating plan, aim to eat more fresh fruits and vegetables, whole grains, lean proteins, low-fat dairy, and heart-healthy fats.  Work with your health care provider or diet and nutrition specialist (dietitian) to adjust your eating plan to your individual   calorie needs. This information is not intended to replace advice given to you by your health care provider. Make sure you discuss any questions you have with your health care provider. Document Released: 02/03/2011 Document Revised: 02/08/2016 Document Reviewed: 02/08/2016 [name]  2018 [employer]  

## 2017-03-28 ENCOUNTER — Encounter: Payer: Self-pay | Admitting: Family Medicine

## 2017-03-28 NOTE — Progress Notes (Signed)
See scanned paperwork  

## 2017-04-12 DIAGNOSIS — D2371 Other benign neoplasm of skin of right lower limb, including hip: Secondary | ICD-10-CM | POA: Diagnosis not present

## 2017-04-12 DIAGNOSIS — L918 Other hypertrophic disorders of the skin: Secondary | ICD-10-CM | POA: Diagnosis not present

## 2017-04-12 DIAGNOSIS — D485 Neoplasm of uncertain behavior of skin: Secondary | ICD-10-CM | POA: Diagnosis not present

## 2017-05-03 ENCOUNTER — Other Ambulatory Visit: Payer: Self-pay | Admitting: *Deleted

## 2017-05-03 MED ORDER — LISINOPRIL 10 MG PO TABS: 10.0000 mg | ORAL_TABLET | Freq: Two times a day (BID) | ORAL | 1 refills | Status: DC

## 2017-05-04 ENCOUNTER — Other Ambulatory Visit: Payer: Self-pay | Admitting: *Deleted

## 2017-05-04 MED ORDER — HYDROCHLOROTHIAZIDE 25 MG PO TABS: 25.0000 mg | ORAL_TABLET | Freq: Every day | ORAL | 1 refills | Status: DC

## 2017-06-15 ENCOUNTER — Encounter (INDEPENDENT_AMBULATORY_CARE_PROVIDER_SITE_OTHER): Payer: 59

## 2017-06-20 ENCOUNTER — Encounter (INDEPENDENT_AMBULATORY_CARE_PROVIDER_SITE_OTHER): Payer: Self-pay | Admitting: Family Medicine

## 2017-06-20 ENCOUNTER — Ambulatory Visit (INDEPENDENT_AMBULATORY_CARE_PROVIDER_SITE_OTHER): Payer: 59 | Admitting: Family Medicine

## 2017-06-20 VITALS — BP 114/77 | HR 71 | Temp 98.5°F | Ht 64.0 in | Wt 204.0 lb

## 2017-06-20 DIAGNOSIS — Z1331 Encounter for screening for depression: Secondary | ICD-10-CM

## 2017-06-20 DIAGNOSIS — R5383 Other fatigue: Secondary | ICD-10-CM | POA: Diagnosis not present

## 2017-06-20 DIAGNOSIS — Z6835 Body mass index (BMI) 35.0-35.9, adult: Secondary | ICD-10-CM | POA: Diagnosis not present

## 2017-06-20 DIAGNOSIS — R739 Hyperglycemia, unspecified: Secondary | ICD-10-CM | POA: Diagnosis not present

## 2017-06-20 DIAGNOSIS — E559 Vitamin D deficiency, unspecified: Secondary | ICD-10-CM | POA: Diagnosis not present

## 2017-06-20 DIAGNOSIS — Z9189 Other specified personal risk factors, not elsewhere classified: Secondary | ICD-10-CM | POA: Diagnosis not present

## 2017-06-20 DIAGNOSIS — R0609 Other forms of dyspnea: Secondary | ICD-10-CM

## 2017-06-20 DIAGNOSIS — I10 Essential (primary) hypertension: Secondary | ICD-10-CM | POA: Diagnosis not present

## 2017-06-20 DIAGNOSIS — Z0289 Encounter for other administrative examinations: Secondary | ICD-10-CM

## 2017-06-21 LAB — CBC WITH DIFFERENTIAL
Basophils Absolute: 0 10*3/uL (ref 0.0–0.2)
Basos: 0 %
EOS (ABSOLUTE): 0.4 10*3/uL (ref 0.0–0.4)
Eos: 5 %
Hematocrit: 41.8 % (ref 34.0–46.6)
Hemoglobin: 14.2 g/dL (ref 11.1–15.9)
IMMATURE GRANULOCYTES: 0 %
Immature Grans (Abs): 0 10*3/uL (ref 0.0–0.1)
Lymphocytes Absolute: 2.5 10*3/uL (ref 0.7–3.1)
Lymphs: 29 %
MCH: 29.8 pg (ref 26.6–33.0)
MCHC: 34 g/dL (ref 31.5–35.7)
MCV: 88 fL (ref 79–97)
MONOS ABS: 0.6 10*3/uL (ref 0.1–0.9)
Monocytes: 7 %
NEUTROS PCT: 59 %
Neutrophils Absolute: 5.2 10*3/uL (ref 1.4–7.0)
RBC: 4.77 x10E6/uL (ref 3.77–5.28)
RDW: 13.7 % (ref 12.3–15.4)
WBC: 8.7 10*3/uL (ref 3.4–10.8)

## 2017-06-21 LAB — COMPREHENSIVE METABOLIC PANEL
ALBUMIN: 4.7 g/dL (ref 3.5–5.5)
ALK PHOS: 73 IU/L (ref 39–117)
ALT: 18 IU/L (ref 0–32)
AST: 15 IU/L (ref 0–40)
Albumin/Globulin Ratio: 2 (ref 1.2–2.2)
BILIRUBIN TOTAL: 0.5 mg/dL (ref 0.0–1.2)
BUN / CREAT RATIO: 16 (ref 9–23)
BUN: 9 mg/dL (ref 6–24)
CHLORIDE: 99 mmol/L (ref 96–106)
CO2: 26 mmol/L (ref 20–29)
Calcium: 10 mg/dL (ref 8.7–10.2)
Creatinine, Ser: 0.56 mg/dL — ABNORMAL LOW (ref 0.57–1.00)
GFR calc Af Amer: 128 mL/min/{1.73_m2} (ref >59)
GFR calc non Af Amer: 111 mL/min/{1.73_m2} (ref >59)
GLOBULIN, TOTAL: 2.3 g/dL (ref 1.5–4.5)
Glucose: 91 mg/dL (ref 65–99)
Potassium: 4.5 mmol/L (ref 3.5–5.2)
SODIUM: 141 mmol/L (ref 134–144)
Total Protein: 7 g/dL (ref 6.0–8.5)

## 2017-06-21 LAB — FOLATE

## 2017-06-21 LAB — VITAMIN D 25 HYDROXY (VIT D DEFICIENCY, FRACTURES): Vit D, 25-Hydroxy: 28.2 ng/mL — ABNORMAL LOW (ref 30.0–100.0)

## 2017-06-21 LAB — HEMOGLOBIN A1C
ESTIMATED AVERAGE GLUCOSE: 103 mg/dL
HEMOGLOBIN A1C: 5.2 % (ref 4.8–5.6)

## 2017-06-21 LAB — LIPID PANEL WITH LDL/HDL RATIO
CHOLESTEROL TOTAL: 159 mg/dL (ref 100–199)
HDL: 47 mg/dL (ref >39)
LDL Calculated: 92 mg/dL (ref 0–99)
LDl/HDL Ratio: 2 ratio (ref 0.0–3.2)
TRIGLYCERIDES: 100 mg/dL (ref 0–149)
VLDL Cholesterol Cal: 20 mg/dL (ref 5–40)

## 2017-06-21 LAB — TSH: TSH: 1.05 u[IU]/mL (ref 0.450–4.500)

## 2017-06-21 LAB — T3: T3 TOTAL: 179 ng/dL (ref 71–180)

## 2017-06-21 LAB — INSULIN, RANDOM: INSULIN: 10.7 u[IU]/mL (ref 2.6–24.9)

## 2017-06-21 LAB — VITAMIN B12: Vitamin B-12: 426 pg/mL (ref 232–1245)

## 2017-06-21 LAB — T4, FREE: Free T4: 1.17 ng/dL (ref 0.82–1.77)

## 2017-06-22 ENCOUNTER — Encounter (INDEPENDENT_AMBULATORY_CARE_PROVIDER_SITE_OTHER): Payer: Self-pay | Admitting: Family Medicine

## 2017-06-22 NOTE — Progress Notes (Signed)
Office: [phone]  /  Fax: [phone]   Dear Dr. Charlett Blake,   Thank you for referring [name] to our clinic. The following note includes my evaluation and treatment recommendations.  HPI:   Chief Complaint: OBESITY    [name] has been referred by Erline Levine A. Charlett Blake, MD for consultation regarding her obesity and obesity related comorbidities.    [name] (MR# [id number]) is a 47 y.o. female who presents on 06/22/2017 for obesity evaluation and treatment. Current BMI is Body mass index is 35.02 kg/m.[name] [name] has been struggling with her weight for many years and has been unsuccessful in either losing weight, maintaining weight loss, or reaching her healthy weight goal.     [name] attended our information session and states she is currently in the action stage of change and ready to dedicate time achieving and maintaining a healthier weight. [name] is interested in becoming our patient and working on intensive lifestyle modifications including (but not limited to) diet, exercise and weight loss. Her husband will be a patient in our program as well.    [name] states her family eats meals together she thinks her family will eat healthier with  her she struggles with family and or coworkers weight loss sabotage her desired weight loss is 76 lbs she has been heavy most of  her life she started gaining weight in [employer] her heaviest weight ever was 216 lbs. she has significant food cravings issues  she is trying to eat vegetarian she is frequently drinking liquids with calories she frequently makes poor food choices she frequently eats larger portions than normal  she has binge eating behaviors she struggles with emotional eating    Fatigue Taisia feels her energy is lower than it should be. This has worsened with weight gain and has not worsened recently. Harriet admits to daytime somnolence and denies waking up still tired. Patient is at risk for obstructive sleep apnea.  Patent has a history of symptoms of daytime fatigue, morning headache and hypertension. Patient generally gets 7 hours of sleep per night, and states they generally have restful sleep. Snoring is present. Apneic episodes are present. Epworth Sleepiness Score is 10  EKG was ordered today and shows Sinus rhythm with a heart rate of 62 beats per minute with slightly prolonged QT interval.  Dyspnea on exertion [name] notes increasing shortness of breath with exercising and seems to be worsening over time with weight gain. She notes getting out of breath sooner with activity than she used to. This has not gotten worse recently. EKG was ordered today and shows Sinus rhythm with a heart rate of 62 beats per minute with slightly prolonged QT interval. [name] denies orthopnea.  Vitamin D deficiency [name] has a diagnosis of vitamin D deficiency. She is currently taking multi vitamin only and denies nausea, vomiting or muscle weakness.  Hyperglycemia [name] has a diagnosis of hyperglycemia. Her last glucose was elevated at 105. She denies polyphagia.;  At risk for diabetes [name] is at higher than average risk for developing diabetes due to her obesity and hyperglycemia. She currently denies polyuria or polydipsia.  Hypertension BRYNDLE CORREDOR is a 47 y.o. female with hypertension.  [name] denies chest pain, chest pressure or headache. She is working weight loss to help control her blood pressure with the goal of decreasing her risk of heart attack and stroke. [name] blood pressure is controlled today.  Depression Screen [name]'s Food and Mood (modified PHQ-9) score was  Depression screen PHQ 2/9 06/20/2017  Decreased Interest 1  Down, Depressed, Hopeless 3  PHQ - 2 Score 4  Altered sleeping 2  Tired, decreased energy 2  Change in appetite 3  Feeling bad or failure about yourself  2  Trouble concentrating 3  Moving slowly or fidgety/restless 0  Suicidal thoughts 0  PHQ-9 Score 16  Difficult  doing work/chores Not difficult at all    ALLERGIES: Allergies  Allergen Reactions  . Betadine [Povidone Iodine] Other (See Comments)    Dx with allergy testing  . Cymbalta [Duloxetine Hcl] Other (See Comments)    Makes her feel crazy, out of body  . Iodine   . Shellfish Allergy Other (See Comments)    Dx with allergy testing    MEDICATIONS: Current Outpatient Medications on File Prior to Visit  Medication Sig Dispense Refill  . estradiol (VIVELLE-DOT) 0.05 MG/24HR Place 1 patch onto the skin 2 (two) times a week. Monday and Thursday    . guaiFENesin (MUCINEX) 600 MG 12 hr tablet Take 600 mg by mouth 2 (two) times daily as needed.    . hydrochlorothiazide (HYDRODIURIL) 25 MG tablet Take 1 tablet (25 mg total) by mouth daily. 90 tablet 1  . ibuprofen (ADVIL,MOTRIN) 200 MG tablet Take 200 mg by mouth every 6 (six) hours as needed.    [name] lisinopril (PRINIVIL,ZESTRIL) 10 MG tablet Take 1 tablet (10 mg total) by mouth 2 (two) times daily. 180 tablet 1  . Multiple Vitamins-Minerals (MULTIVITAMIN ADULT PO) Take by mouth.     No current facility-administered medications on file prior to visit.     PAST MEDICAL HISTORY: Past Medical History:  Diagnosis Date  . Acute mastitis of left breast 10/04/2007  . AMA (advanced maternal age) multigravida 41+   . Cancer ([place]) 2002   Left leg  . Celiac disease   . Chronic back pain    H/O  . Complication of anesthesia    Difficult time waking up  . Constipation in pregnancy 03/2006  . Endometriosis    h/o  . Environmental allergies   . Fibroid    During first pregnancy  . Fibromyalgia   . First trimester bleeding 2008  . GBS carrier   . GERD (gastroesophageal reflux disease)   . Granuloma annulare   . H/O amenorrhea 10/2006  . H/O blood clots    7 yr ago in arm  . H/O varicella    As an infant.  . Headache(784.0)   . Hearing loss 08/15/2016  . Heart murmur   . HTN (hypertension)   . Hyperlipidemia 03/02/2017  . IBS (irritable bowel  syndrome) 08/15/2016  . Irregular uterine bleeding   . Joint pain   . Lactose intolerance   . Leg edema   . Leg pain, left 02/09/2016  . Monilial vaginitis 07/27/04  . Palpitations   . Rh negative status during pregnancy   . SAB (spontaneous abortion)    h/o x 2  . Seasonal allergies   . Sleep apnea 08/15/2016  . TMJ syndrome   . Vegetarianism 08/15/2016  . Vitamin D deficiency 08/16/2015    PAST SURGICAL HISTORY: Past Surgical History:  Procedure Laterality Date  . ABDOMINAL HYSTERECTOMY  2013  . APPENDECTOMY  2002  . DILATION AND EVACUATION    . endocervical polyp removal  2001  . ENDOMETRIAL ABLATION  2010  . HERNIA REPAIR  05/16/12   incisional hernia repair  . HYSTEROSCOPY W/ ENDOMETRIAL ABLATION  01/28/2009  . INSERTION OF MESH  N/A 05/16/2012   Procedure: INSERTION OF MESH;  Surgeon: Joyice Faster. Cornett, MD;  Location: [facility];  Service: General;  Laterality: N/A;  . KNEE ARTHROSCOPY Left 12/05/2012   Procedure: LEFT KNEE ARTHROSCOPY;  Surgeon: Kerin Salen, MD;  Location: [facility];  Service: Orthopedics;  Laterality: Left;  [name] MELANOMA EXCISION  2002   Left Leg  . melanoma removal   2003  . SALPINGOOPHORECTOMY  12/08/2011   Procedure: SALPINGO OOPHERECTOMY;  Surgeon: Eldred Manges, MD;  Location: [facility];  Service: Gynecology;  Laterality: Bilateral;  . TENDON RELEASE  2004  . uterine curettage  01/2009  . VENTRAL HERNIA REPAIR N/A 05/16/2012   Procedure: LAPAROSCOPIC VENTRAL HERNIA;  Surgeon: Joyice Faster. Cornett, MD;  Location: [facility];  Service: General;  Laterality: N/A;  . WISDOM TOOTH EXTRACTION  1989    SOCIAL HISTORY: Social History   Tobacco Use  . Smoking status: Former Smoker    Types: Cigarettes    Last attempt to quit: 02/29/1988    Years since quitting: 29.3  . Smokeless tobacco: Never Used  Substance Use Topics  . Alcohol use: No  . Drug use: No    FAMILY HISTORY: Family History  Problem Relation Age of Onset  . Hypertension Mother     . Cancer Mother        breast  . Heart disease Mother   . Anxiety disorder Mother   . Heart disease Father        atrial fib  . Hypertension Father   . Cancer Father        Lyphoma,Bladder  . Atrial fibrillation Father   . Obesity Father   . Cancer Maternal Grandmother        breast  . Heart disease Maternal Grandfather        MI  . Heart disease Paternal Grandfather        MI  . Cancer Maternal Aunt        ovarian  . Diverticulitis Brother     ROS: Review of Systems  Constitutional: Positive for malaise/fatigue.  Eyes:       Vision Changes  Respiratory: Positive for cough and shortness of breath (on exertion).   Cardiovascular: Negative for chest pain and orthopnea.       Negative for chest pressure  Gastrointestinal: Negative for nausea and vomiting.  Genitourinary: Negative for frequency.  Musculoskeletal: Positive for back pain.       Neck Stiffness Negative for muscle weakness  Skin: Positive for rash.  Neurological: Positive for headaches.  Endo/Heme/Allergies: Negative for polydipsia.       Negative for polyphagia    PHYSICAL EXAM: Blood pressure 114/77, pulse 71, temperature 98.5 F (36.9 C), temperature source Oral, height 5\' 4"  (1.626 m), weight 204 lb (92.5 kg), last menstrual period 02/22/2011, SpO2 98 %. Body mass index is 35.02 kg/m. Physical Exam  Constitutional: She is oriented to person, place, and time. She appears well-developed and well-nourished.  HENT:  Head: Normocephalic and atraumatic.  Nose: Nose normal.  Eyes: EOM are normal. No scleral icterus.  Neck: Normal range of motion. Neck supple. No thyromegaly present.  Cardiovascular: Normal rate.  Pulmonary/Chest: Effort normal. No respiratory distress.  Abdominal: Soft. There is no tenderness.  + obesity  Musculoskeletal: Normal range of motion.  Range of Motion normal in all 4 extremities  Neurological: She is alert and oriented to person, place, and time. Coordination normal.   Skin: Skin is warm and dry.  Psychiatric: She has a normal mood and affect. Her behavior is normal.  Vitals reviewed.   RECENT LABS AND TESTS: BMET    Component Value Date/Time   NA 141 06/20/2017 1155   K 4.5 06/20/2017 1155   CL 99 06/20/2017 1155   CO2 26 06/20/2017 1155   GLUCOSE 91 06/20/2017 1155   GLUCOSE 105 (H) 03/02/2017 0844   BUN 9 06/20/2017 1155   CREATININE 0.56 (L) 06/20/2017 1155   CALCIUM 10.0 06/20/2017 1155   GFRNONAA 111 06/20/2017 1155   GFRAA 128 06/20/2017 1155   Lab Results  Component Value Date   HGBA1C 5.2 06/20/2017   Lab Results  Component Value Date   INSULIN 10.7 06/20/2017   CBC    Component Value Date/Time   WBC 8.7 06/20/2017 1155   WBC 7.0 03/02/2017 0844   RBC 4.77 06/20/2017 1155   RBC 5.10 03/02/2017 0844   HGB 14.2 06/20/2017 1155   HCT 41.8 06/20/2017 1155   PLT 364.0 03/02/2017 0844   MCV 88 06/20/2017 1155   MCH 29.8 06/20/2017 1155   MCH 29.0 05/17/2012 0640   MCHC 34.0 06/20/2017 1155   MCHC 33.4 03/02/2017 0844   RDW 13.7 06/20/2017 1155   LYMPHSABS 2.5 06/20/2017 1155   MONOABS 0.5 02/02/2016 0926   EOSABS 0.4 06/20/2017 1155   BASOSABS 0.0 06/20/2017 1155   Iron/TIBC/Ferritin/ %Sat No results found for: IRON, TIBC, FERRITIN, IRONPCTSAT Lipid Panel     Component Value Date/Time   CHOL 159 06/20/2017 1155   TRIG 100 06/20/2017 1155   HDL 47 06/20/2017 1155   CHOLHDL 4 03/02/2017 0844   VLDL 21.2 03/02/2017 0844   LDLCALC 92 06/20/2017 1155   Hepatic Function Panel     Component Value Date/Time   PROT 7.0 06/20/2017 1155   ALBUMIN 4.7 06/20/2017 1155   AST 15 06/20/2017 1155   ALT 18 06/20/2017 1155   ALKPHOS 73 06/20/2017 1155   BILITOT 0.5 06/20/2017 1155   BILIDIR 0.1 01/28/2015 0946      Component Value Date/Time   TSH 1.050 06/20/2017 1155   TSH 1.79 03/02/2017 0844   TSH 1.05 08/15/2016 1036   Results for NESSA, RAMAKER (MRN [id number]) as of 06/22/2017 07:48  Ref. Range 02/25/2010  20:26  Vitamin D, 25-Hydroxy Latest Ref Range: 30 - 89 ng/mL 31   ECG  shows NSR with a rate of 74 BPM INDIRECT CALORIMETER done today shows a VO2 of 218 and a REE of 1518.  Her calculated basal metabolic rate is 0623 thus her basal metabolic rate is worse than expected.    ASSESSMENT AND PLAN: Other fatigue - Plan: EKG 12-Lead, Lipid Panel With LDL/HDL Ratio, Vitamin B12, Folate, TSH, T4, free, T3  Dyspnea on exertion  Vitamin D deficiency - Plan: VITAMIN D 25 Hydroxy (Vit-D Deficiency, Fractures)  Hyperglycemia - Plan: Hemoglobin A1c, Insulin, random  Essential hypertension - Plan: Comprehensive metabolic panel, CBC With Differential  Depression screening  At risk for diabetes mellitus  Class 2 severe obesity with serious comorbidity and body mass index (BMI) of 35.0 to 35.9 in adult, unspecified obesity type (HCC)  PLAN: Fatigue Jorja was informed that her fatigue may be related to obesity, depression or many other causes. Labs will be ordered, and in the meanwhile Jala has agreed to work on diet, exercise and weight loss to help with fatigue. Proper sleep hygiene was discussed including the need for 7-8 hours of quality sleep each night. A sleep study was not ordered  based on symptoms and Epworth score. We will order indirect calorimetry.  Dyspnea on exertion Marquite's shortness of breath appears to be obesity related and exercise induced. She has agreed to work on weight loss and gradually increase exercise to treat her exercise induced shortness of breath. If [name] follows our instructions and loses weight without improvement of her shortness of breath, we will plan to refer to pulmonology. We will order labs and indirect calorimetry and will monitor this condition regularly. Nayomi agrees to this plan.  Vitamin D Deficiency Maidie was informed that low vitamin D levels contributes to fatigue and are associated with obesity, breast, and colon cancer. She agrees to continue to  take multi vitamin and we will check vitamin D level today. Shirleymae will follow up for routine testing of vitamin D, at least 2-3 times per year.   Hyperglycemia Fasting labs will be obtained and results with be discussed with [name] in 2 weeks at her follow up visit. In the meanwhile Analy was started on a lower simple carbohydrate diet and will work on weight loss efforts.  Diabetes risk counseling Nicolemarie was given extended (15 minutes) diabetes prevention counseling today. She is 47 y.o. female and has risk factors for diabetes including obesity and hyperglycemia. We discussed intensive lifestyle modifications today with an emphasis on weight loss as well as increasing exercise and decreasing simple carbohydrates in her diet.  Hypertension We discussed sodium restriction, working on healthy weight loss, and a regular exercise program as the means to achieve improved blood pressure control. [name] agreed with this plan and agreed to follow up as directed. We will follow up blood pressure at the next visit and will  continue to monitor her blood pressure as well as her progress with the above lifestyle modifications. We will check CMP and she will continue her medications as prescribed and will watch for signs of hypotension as she continues her lifestyle modifications.  Depression Screen Tarryn had a strongly positive depression screening. Depression is commonly associated with obesity and often results in emotional eating behaviors. We will monitor this closely and work on CBT to help improve the non-hunger eating patterns. Referral to Psychology may be required if no improvement is seen as she continues in our clinic.  Obesity Anjelika is currently in the action stage of change and her goal is to continue with weight loss efforts. I recommend Rossy begin the structured treatment plan as follows:  She has agreed to follow our protein rich vegetarian plan Deyci has been instructed to eventually work up to  a goal of 150 minutes of combined cardio and strengthening exercise per week for weight loss and overall health benefits. We discussed the following Behavioral Modification Strategies today: planning for success, better snacking choices, increasing lean protein intake, increasing vegetables and work on meal planning and easy cooking plans   She was informed of the importance of frequent follow up visits to maximize her success with intensive lifestyle modifications for her multiple health conditions. She was informed we would discuss her lab results at her next visit unless there is a critical issue that needs to be addressed sooner. Britta agreed to keep her next visit at the agreed upon time to discuss these results.    OBESITY BEHAVIORAL INTERVENTION VISIT  Today's visit was # 1 out of 22.  Starting weight: 204 lbs Starting date: 06/20/17 Today's weight : 204 lbs Today's date: 06/20/2017 Total lbs lost to date: 0 (Patients must lose 7 lbs in the first  6 months to continue with counseling)   ASK: We discussed the diagnosis of obesity with [name] today and Aryaa agreed to give [place] permission to discuss obesity behavioral modification therapy today.  ASSESS: Mishayla has the diagnosis of obesity and her BMI today is 75 Sheketa is in the action stage of change   ADVISE: Iline was educated on the multiple health risks of obesity as well as the benefit of weight loss to improve her health. She was advised of the need for long term treatment and the importance of lifestyle modifications.  AGREE: Multiple dietary modification options and treatment options were discussed and  Sparrow agreed to the above obesity treatment plan.   I, Doreene Nest, am acting as transcriptionist for  Eber Jones, MD  I have reviewed the above documentation for accuracy and completeness, and I agree with the above. - Ilene Qua, MD

## 2017-06-28 DIAGNOSIS — N951 Menopausal and female climacteric states: Secondary | ICD-10-CM | POA: Diagnosis not present

## 2017-06-28 DIAGNOSIS — Z01419 Encounter for gynecological examination (general) (routine) without abnormal findings: Secondary | ICD-10-CM | POA: Diagnosis not present

## 2017-06-28 DIAGNOSIS — Z1231 Encounter for screening mammogram for malignant neoplasm of breast: Secondary | ICD-10-CM | POA: Diagnosis not present

## 2017-07-04 ENCOUNTER — Ambulatory Visit (INDEPENDENT_AMBULATORY_CARE_PROVIDER_SITE_OTHER): Payer: 59 | Admitting: Family Medicine

## 2017-07-04 ENCOUNTER — Encounter: Payer: Self-pay | Admitting: Family Medicine

## 2017-07-04 ENCOUNTER — Other Ambulatory Visit: Payer: Self-pay | Admitting: Family Medicine

## 2017-07-04 VITALS — BP 109/74 | HR 74 | Temp 98.1°F | Ht 64.0 in | Wt 199.0 lb

## 2017-07-04 DIAGNOSIS — R197 Diarrhea, unspecified: Secondary | ICD-10-CM

## 2017-07-04 DIAGNOSIS — Z9189 Other specified personal risk factors, not elsewhere classified: Secondary | ICD-10-CM

## 2017-07-04 DIAGNOSIS — E559 Vitamin D deficiency, unspecified: Secondary | ICD-10-CM

## 2017-07-04 DIAGNOSIS — Z6834 Body mass index (BMI) 34.0-34.9, adult: Secondary | ICD-10-CM

## 2017-07-04 DIAGNOSIS — E88819 Insulin resistance, unspecified: Secondary | ICD-10-CM

## 2017-07-04 DIAGNOSIS — E8881 Metabolic syndrome: Secondary | ICD-10-CM

## 2017-07-04 DIAGNOSIS — K219 Gastro-esophageal reflux disease without esophagitis: Secondary | ICD-10-CM

## 2017-07-04 DIAGNOSIS — E669 Obesity, unspecified: Secondary | ICD-10-CM | POA: Diagnosis not present

## 2017-07-04 DIAGNOSIS — R109 Unspecified abdominal pain: Secondary | ICD-10-CM

## 2017-07-04 MED ORDER — VITAMIN D (ERGOCALCIFEROL) 1.25 MG (50000 UNIT) PO CAPS: 50000.0000 [IU] | ORAL_CAPSULE | ORAL | 0 refills | Status: DC

## 2017-07-06 NOTE — Progress Notes (Signed)
Office: [phone]  /  Fax: [phone]   HPI:   Chief Complaint: OBESITY [name] is here to discuss her progress with her obesity treatment plan. She is on the protein rich vegetarian plan and is following her eating plan approximately 90 % of the time. She states she is doing weights and cardio with a personal trainer for 30 minutes 1 times per week. [name] found she was hungry at the time for a meal. She liked everything on the meal plan. She is only craving salt occasionally. Her weight is 199 lb (90.3 kg) today and has had a weight loss of 5 pounds over a period of 2 weeks since her last visit. She has lost 5 lbs since starting treatment with [place].  Vitamin D deficiency [name] has a diagnosis of vitamin D deficiency. She has a vitamin D level of 28.2 and is currently taking vit D. [name] denies nausea, vomiting or muscle weakness.  At risk for osteopenia and osteoporosis [name] is at higher risk of osteopenia and osteoporosis due to vitamin D deficiency.   Insulin Resistance [name] has a diagnosis of insulin resistance based on her elevated fasting insulin level of 10.7. Although [name] are still under good control, insulin resistance puts her at greater risk of metabolic syndrome and diabetes. She is not taking metformin currently and continues to work on diet and exercise to decrease risk of diabetes.  ALLERGIES: Allergies  Allergen Reactions  . Betadine [Povidone Iodine] Other (See Comments)    Dx with allergy testing  . Cymbalta [Duloxetine Hcl] Other (See Comments)    Makes her feel crazy, out of body  . Iodine   . Shellfish Allergy Other (See Comments)    Dx with allergy testing    MEDICATIONS: Current Outpatient Medications on File Prior to Visit  Medication Sig Dispense Refill  . estradiol (VIVELLE-DOT) 0.05 MG/24HR Place 1 patch onto the skin 2 (two) times a week. Monday and Thursday    . guaiFENesin (MUCINEX) 600 MG 12 hr tablet Take 600 mg by  mouth 2 (two) times daily as needed.    . hydrochlorothiazide (HYDRODIURIL) 25 MG tablet Take 1 tablet (25 mg total) by mouth daily. 90 tablet 1  . ibuprofen (ADVIL,MOTRIN) 200 MG tablet Take 200 mg by mouth every 6 (six) hours as needed.    [name] lisinopril (PRINIVIL,ZESTRIL) 10 MG tablet Take 1 tablet (10 mg total) by mouth 2 (two) times daily. 180 tablet 1  . Multiple Vitamins-Minerals (MULTIVITAMIN ADULT PO) Take by mouth.     No current facility-administered medications on file prior to visit.     PAST MEDICAL HISTORY: Past Medical History:  Diagnosis Date  . Acute mastitis of left breast 10/04/2007  . AMA (advanced maternal age) multigravida 25+   . Cancer ([place]) 2002   Left leg  . Celiac disease   . Chronic back pain    H/O  . Complication of anesthesia    Difficult time waking up  . Constipation in pregnancy 03/2006  . Endometriosis    h/o  . Environmental allergies   . Fibroid    During first pregnancy  . Fibromyalgia   . First trimester bleeding 2008  . GBS carrier   . GERD (gastroesophageal reflux disease)   . Granuloma annulare   . H/O amenorrhea 10/2006  . H/O blood clots    7 yr ago in arm  . H/O varicella    As an infant.  . Headache(784.0)   . Hearing  loss 08/15/2016  . Heart murmur   . HTN (hypertension)   . Hyperlipidemia 03/02/2017  . IBS (irritable bowel syndrome) 08/15/2016  . Irregular uterine bleeding   . Joint pain   . Lactose intolerance   . Leg edema   . Leg pain, left 02/09/2016  . Monilial vaginitis 07/27/04  . Palpitations   . Rh negative status during pregnancy   . SAB (spontaneous abortion)    h/o x 2  . Seasonal allergies   . Sleep apnea 08/15/2016  . TMJ syndrome   . Vegetarianism 08/15/2016  . Vitamin D deficiency 08/16/2015    PAST SURGICAL HISTORY: Past Surgical History:  Procedure Laterality Date  . ABDOMINAL HYSTERECTOMY  2013  . APPENDECTOMY  2002  . DILATION AND EVACUATION    . endocervical polyp removal  2001  . ENDOMETRIAL  ABLATION  2010  . HERNIA REPAIR  05/16/12   incisional hernia repair  . HYSTEROSCOPY W/ ENDOMETRIAL ABLATION  01/28/2009  . INSERTION OF MESH N/A 05/16/2012   Procedure: INSERTION OF MESH;  Surgeon: Joyice Faster. Cornett, MD;  Location: [facility];  Service: General;  Laterality: N/A;  . KNEE ARTHROSCOPY Left 12/05/2012   Procedure: LEFT KNEE ARTHROSCOPY;  Surgeon: Kerin Salen, MD;  Location: [facility];  Service: Orthopedics;  Laterality: Left;  [name] MELANOMA EXCISION  2002   Left Leg  . melanoma removal   2003  . SALPINGOOPHORECTOMY  12/08/2011   Procedure: SALPINGO OOPHERECTOMY;  Surgeon: Eldred Manges, MD;  Location: [facility];  Service: Gynecology;  Laterality: Bilateral;  . TENDON RELEASE  2004  . uterine curettage  01/2009  . VENTRAL HERNIA REPAIR N/A 05/16/2012   Procedure: LAPAROSCOPIC VENTRAL HERNIA;  Surgeon: Joyice Faster. Cornett, MD;  Location: [facility];  Service: General;  Laterality: N/A;  . WISDOM TOOTH EXTRACTION  1989    SOCIAL HISTORY: Social History   Tobacco Use  . Smoking status: Former Smoker    Types: Cigarettes    Last attempt to quit: 02/29/1988    Years since quitting: 29.3  . Smokeless tobacco: Never Used  Substance Use Topics  . Alcohol use: No  . Drug use: No    FAMILY HISTORY: Family History  Problem Relation Age of Onset  . Hypertension Mother   . Cancer Mother        breast  . Heart disease Mother   . Anxiety disorder Mother   . Heart disease Father        atrial fib  . Hypertension Father   . Cancer Father        Lyphoma,Bladder  . Atrial fibrillation Father   . Obesity Father   . Cancer Maternal Grandmother        breast  . Heart disease Maternal Grandfather        MI  . Heart disease Paternal Grandfather        MI  . Cancer Maternal Aunt        ovarian  . Diverticulitis Brother     ROS: Review of Systems  Constitutional: Positive for weight loss.  Gastrointestinal: Negative for nausea and vomiting.  Musculoskeletal:        Negative for muscle weakness    PHYSICAL EXAM: Blood pressure 109/74, pulse 74, temperature 98.1 F (36.7 C), temperature source Oral, height 5\' 4"  (1.626 m), weight 199 lb (90.3 kg), last menstrual period 02/22/2011, SpO2 98 %. Body mass index is 34.16 kg/m. Physical Exam  Constitutional: She is oriented to person,  place, and time. She appears well-developed and well-nourished.  Cardiovascular: Normal rate.  Pulmonary/Chest: Effort normal.  Musculoskeletal: Normal range of motion.  Neurological: She is oriented to person, place, and time.  Skin: Skin is warm and dry.  Psychiatric: She has a normal mood and affect. Her behavior is normal.  Vitals reviewed.   RECENT LABS AND TESTS: BMET    Component Value Date/Time   NA 141 06/20/2017 1155   K 4.5 06/20/2017 1155   CL 99 06/20/2017 1155   CO2 26 06/20/2017 1155   GLUCOSE 91 06/20/2017 1155   GLUCOSE 105 (H) 03/02/2017 0844   BUN 9 06/20/2017 1155   CREATININE 0.56 (L) 06/20/2017 1155   CALCIUM 10.0 06/20/2017 1155   GFRNONAA 111 06/20/2017 1155   GFRAA 128 06/20/2017 1155   Lab Results  Component Value Date   HGBA1C 5.2 06/20/2017   Lab Results  Component Value Date   INSULIN 10.7 06/20/2017   CBC    Component Value Date/Time   WBC 8.7 06/20/2017 1155   WBC 7.0 03/02/2017 0844   RBC 4.77 06/20/2017 1155   RBC 5.10 03/02/2017 0844   HGB 14.2 06/20/2017 1155   HCT 41.8 06/20/2017 1155   PLT 364.0 03/02/2017 0844   MCV 88 06/20/2017 1155   MCH 29.8 06/20/2017 1155   MCH 29.0 05/17/2012 0640   MCHC 34.0 06/20/2017 1155   MCHC 33.4 03/02/2017 0844   RDW 13.7 06/20/2017 1155   LYMPHSABS 2.5 06/20/2017 1155   MONOABS 0.5 02/02/2016 0926   EOSABS 0.4 06/20/2017 1155   BASOSABS 0.0 06/20/2017 1155   Iron/TIBC/Ferritin/ %Sat No results found for: IRON, TIBC, FERRITIN, IRONPCTSAT Lipid Panel     Component Value Date/Time   CHOL 159 06/20/2017 1155   TRIG 100 06/20/2017 1155   HDL 47 06/20/2017 1155    CHOLHDL 4 03/02/2017 0844   VLDL 21.2 03/02/2017 0844   LDLCALC 92 06/20/2017 1155   Hepatic Function Panel     Component Value Date/Time   PROT 7.0 06/20/2017 1155   ALBUMIN 4.7 06/20/2017 1155   AST 15 06/20/2017 1155   ALT 18 06/20/2017 1155   ALKPHOS 73 06/20/2017 1155   BILITOT 0.5 06/20/2017 1155   BILIDIR 0.1 01/28/2015 0946      Component Value Date/Time   TSH 1.050 06/20/2017 1155   TSH 1.79 03/02/2017 0844   TSH 1.05 08/15/2016 1036   Results for EDNAMAE, SCHIANO (MRN [id number]) as of 07/06/2017 10:11  Ref. Range 06/20/2017 11:55  Vitamin D, 25-Hydroxy Latest Ref Range: 30.0 - 100.0 ng/mL 28.2 (L)   ASSESSMENT AND PLAN: Vitamin D deficiency - Plan: Vitamin D, Ergocalciferol, (DRISDOL) 50000 units CAPS capsule  Insulin resistance  At risk for osteoporosis  Class 1 obesity with serious comorbidity and body mass index (BMI) of 34.0 to 34.9 in adult, unspecified obesity type  PLAN:  Vitamin D Deficiency [name] was informed that low vitamin D levels contributes to fatigue and are associated with obesity, breast, and colon cancer. She agrees to continue to take prescription Vit D @50 ,000 IU every week and will follow up for routine testing of vitamin D, at least 2-3 times per year. She was informed of the risk of over-replacement of vitamin D and agrees to not increase her dose unless she discusses this with [place] first.  At risk for osteopenia and osteoporosis [name] is at risk for osteopenia and osteoporosis due to her vitamin D deficiency. She was encouraged to take her vitamin D and follow  her higher calcium diet and increase strengthening exercise to help strengthen her bones and decrease her risk of osteopenia and osteoporosis.  Insulin Resistance [name] will continue to work on weight loss, exercise, and decreasing simple carbohydrates in her diet to help decrease the risk of diabetes. She was informed that eating too many simple carbohydrates or too many calories at one  sitting increases the likelihood of GI side effects. [name] agreed to continue with the vegetarian plan for the next 2 weeks and follow up with [place] as directed to monitor her progress.  Obesity [name] is currently in the action stage of change. As such, her goal is to continue with weight loss efforts She has agreed to keep a food journal with 1100 to 1300 calories and 75 grams of protein daily [name] has been instructed to work up to a goal of 150 minutes of combined cardio and strengthening exercise per week for weight loss and overall health benefits. We discussed the following Behavioral Modification Strategies today: planning for success, keep a strict food journal, increasing lean protein intake, increasing vegetables and work on meal planning and easy cooking plans  [name] has agreed to follow up with our clinic in 2 weeks. She was informed of the importance of frequent follow up visits to maximize her success with intensive lifestyle modifications for her multiple health conditions.   OBESITY BEHAVIORAL INTERVENTION VISIT  Today's visit was # 2 out of 22.  Starting weight: 204 lbs Starting date: 06/20/17 Today's weight : 199 lbs Today's date: 07/04/2017 Total lbs lost to date: 199 lbs (Patients must lose 7 lbs in the first 6 months to continue with counseling)   ASK: We discussed the diagnosis of obesity with [name] today and [name] agreed to give [place] permission to discuss obesity behavioral modification therapy today.  ASSESS: [name] has the diagnosis of obesity and her BMI today is 34.14 [name] is in the action stage of change   ADVISE: [name] was educated on the multiple health risks of obesity as well as the benefit of weight loss to improve her health. She was advised of the need for long term treatment and the importance of lifestyle modifications.  AGREE: Multiple dietary modification options and treatment options were discussed and  [name] agreed to the above obesity  treatment plan.  I, [name], am acting as transcriptionist for Eber Jones, MD  I have reviewed the above documentation for accuracy and completeness, and I agree with the above. - Ilene Qua, MD

## 2017-07-19 ENCOUNTER — Ambulatory Visit (INDEPENDENT_AMBULATORY_CARE_PROVIDER_SITE_OTHER): Payer: 59 | Admitting: Family Medicine

## 2017-07-19 VITALS — BP 103/70 | HR 73 | Temp 97.9°F | Ht 64.0 in | Wt 197.0 lb

## 2017-07-19 DIAGNOSIS — E559 Vitamin D deficiency, unspecified: Secondary | ICD-10-CM

## 2017-07-19 DIAGNOSIS — Z6833 Body mass index (BMI) 33.0-33.9, adult: Secondary | ICD-10-CM

## 2017-07-19 DIAGNOSIS — I10 Essential (primary) hypertension: Secondary | ICD-10-CM

## 2017-07-19 DIAGNOSIS — Z9189 Other specified personal risk factors, not elsewhere classified: Secondary | ICD-10-CM

## 2017-07-19 DIAGNOSIS — E669 Obesity, unspecified: Secondary | ICD-10-CM

## 2017-07-19 MED ORDER — VITAMIN D (ERGOCALCIFEROL) 1.25 MG (50000 UNIT) PO CAPS: 50000.0000 [IU] | ORAL_CAPSULE | ORAL | 0 refills | Status: DC

## 2017-07-19 NOTE — Progress Notes (Signed)
Office: [phone]  /  Fax: [phone]   HPI:   Chief Complaint: OBESITY [name] is here to discuss her progress with her obesity treatment plan. She is on the keep a food journal with 1100-1300 calories and 75 grams of protein daily and is following her eating plan approximately 90 % of the time. She states she has a Physiological scientist for 30 minutes 2 times per week. [name] is still enjoying journaling. Hitting protein most days. Finds when she eats out may have increase fat or salt than she would like.  Her weight is 197 lb (89.4 kg) today and has had a weight loss of 2 pounds over a period of 2 weeks since her last visit. She has lost 7 lbs since starting treatment with [place].  Hypertension [name] is a 47 y.o. female with hypertension. [name] blood pressure is low. She denies dizziness, lightheadedness, chest pain, chest pressure, or headache. She is working weight loss to help control her blood pressure with the goal of decreasing her risk of heart attack and stroke. [name]'s blood pressure is not currently controlled.  Vitamin D Deficiency [name] has a diagnosis of vitamin D deficiency. She is currently taking prescription Vit D. She notes fatigue and denies nausea, vomiting or muscle weakness.  At risk for osteopenia and osteoporosis [name] is at higher risk of osteopenia and osteoporosis due to vitamin D deficiency.   ALLERGIES: Allergies  Allergen Reactions  . Betadine [Povidone Iodine] Other (See Comments)    Dx with allergy testing  . Cymbalta [Duloxetine Hcl] Other (See Comments)    Makes her feel crazy, out of body  . Iodine   . Shellfish Allergy Other (See Comments)    Dx with allergy testing    MEDICATIONS: Current Outpatient Medications on File Prior to Visit  Medication Sig Dispense Refill  . estradiol (VIVELLE-DOT) 0.05 MG/24HR Place 1 patch onto the skin 2 (two) times a week. Monday and Thursday    . guaiFENesin (MUCINEX) 600 MG 12 hr tablet Take 600 mg by  mouth 2 (two) times daily as needed.    [name] ibuprofen (ADVIL,MOTRIN) 200 MG tablet Take 200 mg by mouth every 6 (six) hours as needed.    [name] lisinopril (PRINIVIL,ZESTRIL) 10 MG tablet Take 1 tablet (10 mg total) by mouth 2 (two) times daily. 180 tablet 1  . Multiple Vitamins-Minerals (MULTIVITAMIN ADULT PO) Take by mouth.     No current facility-administered medications on file prior to visit.     PAST MEDICAL HISTORY: Past Medical History:  Diagnosis Date  . Acute mastitis of left breast 10/04/2007  . AMA (advanced maternal age) multigravida 82+   . Cancer ([facility]) 2002   Left leg  . Celiac disease   . Chronic back pain    H/O  . Complication of anesthesia    Difficult time waking up  . Constipation in pregnancy 03/2006  . Endometriosis    h/o  . Environmental allergies   . Fibroid    During first pregnancy  . Fibromyalgia   . First trimester bleeding 2008  . GBS carrier   . GERD (gastroesophageal reflux disease)   . Granuloma annulare   . H/O amenorrhea 10/2006  . H/O blood clots    7 yr ago in arm  . H/O varicella    As an infant.  . Headache(784.0)   . Hearing loss 08/15/2016  . Heart murmur   . HTN (hypertension)   . Hyperlipidemia 03/02/2017  . IBS (irritable bowel  syndrome) 08/15/2016  . Irregular uterine bleeding   . Joint pain   . Lactose intolerance   . Leg edema   . Leg pain, left 02/09/2016  . Monilial vaginitis 07/27/04  . Palpitations   . Rh negative status during pregnancy   . SAB (spontaneous abortion)    h/o x 2  . Seasonal allergies   . Sleep apnea 08/15/2016  . TMJ syndrome   . Vegetarianism 08/15/2016  . Vitamin D deficiency 08/16/2015    PAST SURGICAL HISTORY: Past Surgical History:  Procedure Laterality Date  . ABDOMINAL HYSTERECTOMY  2013  . APPENDECTOMY  2002  . DILATION AND EVACUATION    . endocervical polyp removal  2001  . ENDOMETRIAL ABLATION  2010  . HERNIA REPAIR  05/16/12   incisional hernia repair  . HYSTEROSCOPY W/ ENDOMETRIAL  ABLATION  01/28/2009  . INSERTION OF MESH N/A 05/16/2012   Procedure: INSERTION OF MESH;  Surgeon: Joyice Faster. Cornett, MD;  Location: [facility];  Service: General;  Laterality: N/A;  . KNEE ARTHROSCOPY Left 12/05/2012   Procedure: LEFT KNEE ARTHROSCOPY;  Surgeon: Kerin Salen, MD;  Location: [facility];  Service: Orthopedics;  Laterality: Left;  [name] MELANOMA EXCISION  2002   Left Leg  . melanoma removal   2003  . SALPINGOOPHORECTOMY  12/08/2011   Procedure: SALPINGO OOPHERECTOMY;  Surgeon: Eldred Manges, MD;  Location: [facility];  Service: Gynecology;  Laterality: Bilateral;  . TENDON RELEASE  2004  . uterine curettage  01/2009  . VENTRAL HERNIA REPAIR N/A 05/16/2012   Procedure: LAPAROSCOPIC VENTRAL HERNIA;  Surgeon: Joyice Faster. Cornett, MD;  Location: [facility];  Service: General;  Laterality: N/A;  . WISDOM TOOTH EXTRACTION  1989    SOCIAL HISTORY: Social History   Tobacco Use  . Smoking status: Former Smoker    Types: Cigarettes    Last attempt to quit: 02/29/1988    Years since quitting: 29.4  . Smokeless tobacco: Never Used  Substance Use Topics  . Alcohol use: No  . Drug use: No    FAMILY HISTORY: Family History  Problem Relation Age of Onset  . Hypertension Mother   . Cancer Mother        breast  . Heart disease Mother   . Anxiety disorder Mother   . Heart disease Father        atrial fib  . Hypertension Father   . Cancer Father        Lyphoma,Bladder  . Atrial fibrillation Father   . Obesity Father   . Cancer Maternal Grandmother        breast  . Heart disease Maternal Grandfather        MI  . Heart disease Paternal Grandfather        MI  . Cancer Maternal Aunt        ovarian  . Diverticulitis Brother     ROS: Review of Systems  Constitutional: Positive for malaise/fatigue and weight loss.  Cardiovascular: Negative for chest pain.       Negative chest pressure  Gastrointestinal: Negative for abdominal pain and vomiting.  Musculoskeletal:        Negative muscle weakness  Neurological: Negative for dizziness and headaches.       Negative lightheadedness    PHYSICAL EXAM: Blood pressure 103/70, pulse 73, temperature 97.9 F (36.6 C), temperature source Oral, height 5\' 4"  (1.626 m), weight 197 lb (89.4 kg), last menstrual period 02/22/2011, SpO2 97 %. Body mass index is  33.81 kg/m. Physical Exam  Constitutional: She is oriented to person, place, and time. She appears well-developed and well-nourished.  Cardiovascular: Normal rate.  Pulmonary/Chest: Effort normal.  Musculoskeletal: Normal range of motion.  Neurological: She is oriented to person, place, and time.  Skin: Skin is warm and dry.  Psychiatric: She has a normal mood and affect. Her behavior is normal.  Vitals reviewed.   RECENT LABS AND TESTS: BMET    Component Value Date/Time   NA 141 06/20/2017 1155   K 4.5 06/20/2017 1155   CL 99 06/20/2017 1155   CO2 26 06/20/2017 1155   GLUCOSE 91 06/20/2017 1155   GLUCOSE 105 (H) 03/02/2017 0844   BUN 9 06/20/2017 1155   CREATININE 0.56 (L) 06/20/2017 1155   CALCIUM 10.0 06/20/2017 1155   GFRNONAA 111 06/20/2017 1155   GFRAA 128 06/20/2017 1155   Lab Results  Component Value Date   HGBA1C 5.2 06/20/2017   Lab Results  Component Value Date   INSULIN 10.7 06/20/2017   CBC    Component Value Date/Time   WBC 8.7 06/20/2017 1155   WBC 7.0 03/02/2017 0844   RBC 4.77 06/20/2017 1155   RBC 5.10 03/02/2017 0844   HGB 14.2 06/20/2017 1155   HCT 41.8 06/20/2017 1155   PLT 364.0 03/02/2017 0844   MCV 88 06/20/2017 1155   MCH 29.8 06/20/2017 1155   MCH 29.0 05/17/2012 0640   MCHC 34.0 06/20/2017 1155   MCHC 33.4 03/02/2017 0844   RDW 13.7 06/20/2017 1155   LYMPHSABS 2.5 06/20/2017 1155   MONOABS 0.5 02/02/2016 0926   EOSABS 0.4 06/20/2017 1155   BASOSABS 0.0 06/20/2017 1155   Iron/TIBC/Ferritin/ %Sat No results found for: IRON, TIBC, FERRITIN, IRONPCTSAT Lipid Panel     Component Value Date/Time   CHOL  159 06/20/2017 1155   TRIG 100 06/20/2017 1155   HDL 47 06/20/2017 1155   CHOLHDL 4 03/02/2017 0844   VLDL 21.2 03/02/2017 0844   LDLCALC 92 06/20/2017 1155   Hepatic Function Panel     Component Value Date/Time   PROT 7.0 06/20/2017 1155   ALBUMIN 4.7 06/20/2017 1155   AST 15 06/20/2017 1155   ALT 18 06/20/2017 1155   ALKPHOS 73 06/20/2017 1155   BILITOT 0.5 06/20/2017 1155   BILIDIR 0.1 01/28/2015 0946      Component Value Date/Time   TSH 1.050 06/20/2017 1155   TSH 1.79 03/02/2017 0844   TSH 1.05 08/15/2016 1036  Results for VINNIE, BOBST (MRN [id number]) as of 07/19/2017 11:20  Ref. Range 06/20/2017 11:55  Vitamin D, 25-Hydroxy Latest Ref Range: 30.0 - 100.0 ng/mL 28.2 (L)    ASSESSMENT AND PLAN: Essential hypertension  Vitamin D deficiency - Plan: Vitamin D, Ergocalciferol, (DRISDOL) 50000 units CAPS capsule  At risk for osteoporosis  Class 1 obesity with serious comorbidity and body mass index (BMI) of 33.0 to 33.9 in adult, unspecified obesity type  PLAN:  Hypertension We discussed sodium restriction, working on healthy weight loss, and a regular exercise program as the means to achieve improved blood pressure control. [name] agreed with this plan and agreed to follow up as directed. We will continue to monitor her blood pressure as well as her progress with the above lifestyle modifications. Cyd agrees to stop hydrochlorothiazide and will watch for signs of hypotension as she continues her lifestyle modifications. Caley agrees to follow up with our clinic in 2 weeks.  Vitamin D Deficiency Jamari was informed that low vitamin D levels contributes to  fatigue and are associated with obesity, breast, and colon cancer. Taraya agrees to continue taking prescription Vit D @50 ,000 IU every week #4 and we will refill for 1 month. She will follow up for routine testing of vitamin D, at least 2-3 times per year. She was informed of the risk of over-replacement of vitamin D and  agrees to not increase her dose unless she discusses this with [place] first. Evelena agrees to follow up with our clinic in 2 weeks.  At risk for osteopenia and osteoporosis Maegen is at risk for osteopenia and osteoporsis due to her vitamin D deficiency. She was encouraged to take her vitamin D and follow her higher calcium diet and increase strengthening exercise to help strengthen her bones and decrease her risk of osteopenia and osteoporosis.  Obesity Hikari is currently in the action stage of change. As such, her goal is to continue with weight loss efforts She has agreed to keep a food journal with 1100-1300 calories and 75 grams of protein daily Joletta has been instructed to work up to a goal of 150 minutes of combined cardio and strengthening exercise per week for weight loss and overall health benefits. We discussed the following Behavioral Modification Strategies today: increasing lean protein intake, increasing vegetables, work on meal planning and easy cooking plans, planning for success, and keep a strict food journal   Retal has agreed to follow up with our clinic in 2 weeks. She was informed of the importance of frequent follow up visits to maximize her success with intensive lifestyle modifications for her multiple health conditions.   OBESITY BEHAVIORAL INTERVENTION VISIT  Today's visit was # 3 out of 22.  Starting weight: 204 lbs Starting date: 06/20/17 Today's weight : 197 lbs Today's date: 07/19/2017 Total lbs lost to date: 7 (Patients must lose 7 lbs in the first 6 months to continue with counseling)   ASK: We discussed the diagnosis of obesity with [name] today and [name] agreed to give [place] permission to discuss obesity behavioral modification therapy today.  ASSESS: Surina has the diagnosis of obesity and her BMI today is 33.8 Caycee is in the action stage of change   ADVISE: Renelda was educated on the multiple health risks of obesity as well as the benefit of  weight loss to improve her health. She was advised of the need for long term treatment and the importance of lifestyle modifications.  AGREE: Multiple dietary modification options and treatment options were discussed and  Josalyn agreed to the above obesity treatment plan.  I, Trixie Dredge, am acting as transcriptionist for Ilene Qua, MD  I have reviewed the above documentation for accuracy and completeness, and I agree with the above. - Ilene Qua, MD

## 2017-08-10 ENCOUNTER — Ambulatory Visit (INDEPENDENT_AMBULATORY_CARE_PROVIDER_SITE_OTHER): Payer: 59 | Admitting: Family Medicine

## 2017-08-10 VITALS — BP 113/77 | HR 63 | Temp 98.0°F | Ht 64.0 in | Wt 196.0 lb

## 2017-08-10 DIAGNOSIS — E559 Vitamin D deficiency, unspecified: Secondary | ICD-10-CM

## 2017-08-10 DIAGNOSIS — R51 Headache: Secondary | ICD-10-CM | POA: Diagnosis not present

## 2017-08-10 DIAGNOSIS — E669 Obesity, unspecified: Secondary | ICD-10-CM | POA: Diagnosis not present

## 2017-08-10 DIAGNOSIS — I10 Essential (primary) hypertension: Secondary | ICD-10-CM

## 2017-08-10 DIAGNOSIS — Z6833 Body mass index (BMI) 33.0-33.9, adult: Secondary | ICD-10-CM

## 2017-08-10 DIAGNOSIS — G8929 Other chronic pain: Secondary | ICD-10-CM

## 2017-08-10 NOTE — Progress Notes (Signed)
Office: [phone]  /  Fax: [phone]   HPI:   Chief Complaint: OBESITY [name] is here to discuss her progress with her obesity treatment plan. She is on the  keep a food journal with 1100 to 1300 calories and 75 grams of protein daily and is following her eating plan approximately 50 % of the time. She states she is exercising with a personal trainer for 30 minutes 2 times per week. Natalie did some celebratory eating with kids birthdays. She has been eating out quite a bit for birthdays. [name] struggled with over indulging in calories, and she is not getting in protein when eating out. Her weight is 196 lb (88.9 kg) today and has had a weight loss of 1 pound over a period of 3 weeks since her last visit. She has lost 8 lbs since starting treatment with [place].  Vitamin D deficiency [name] has a diagnosis of vitamin D deficiency. She is currently taking vit D. Fatigue is improving and she denies nausea, vomiting or muscle weakness.  Hypertension JAMYLAH [name] is a 47 y.o. female with hypertension. Emmanuella stopped HCTZ last night. [name] denies chest pain or chest pressure. She is working weight loss to help control her blood pressure with the goal of decreasing her risk of heart attack and stroke. [name] blood pressure is controlled today.  Headaches [name] has noticed more consistent headaches in the past 3 weeks. [name] blood pressure is controlled.  ALLERGIES: Allergies  Allergen Reactions  . Betadine [Povidone Iodine] Other (See Comments)    Dx with allergy testing  . Cymbalta [Duloxetine Hcl] Other (See Comments)    Makes her feel crazy, out of body  . Iodine   . Shellfish Allergy Other (See Comments)    Dx with allergy testing    MEDICATIONS: Current Outpatient Medications on File Prior to Visit  Medication Sig Dispense Refill  . estradiol (VIVELLE-DOT) 0.05 MG/24HR Place 1 patch onto the skin 2 (two) times a week. Monday and Thursday    . guaiFENesin (MUCINEX) 600  MG 12 hr tablet Take 600 mg by mouth 2 (two) times daily as needed.    [name] ibuprofen (ADVIL,MOTRIN) 200 MG tablet Take 200 mg by mouth every 6 (six) hours as needed.    [name] lisinopril (PRINIVIL,ZESTRIL) 10 MG tablet Take 1 tablet (10 mg total) by mouth 2 (two) times daily. 180 tablet 1  . Multiple Vitamins-Minerals (MULTIVITAMIN ADULT PO) Take by mouth.    . Vitamin D, Ergocalciferol, (DRISDOL) 50000 units CAPS capsule Take 1 capsule (50,000 Units total) by mouth every 7 (seven) days. 4 capsule 0   No current facility-administered medications on file prior to visit.     PAST MEDICAL HISTORY: Past Medical History:  Diagnosis Date  . Acute mastitis of left breast 10/04/2007  . AMA (advanced maternal age) multigravida 43+   . Cancer ([place]) 2002   Left leg  . Celiac disease   . Chronic back pain    H/O  . Complication of anesthesia    Difficult time waking up  . Constipation in pregnancy 03/2006  . Endometriosis    h/o  . Environmental allergies   . Fibroid    During first pregnancy  . Fibromyalgia   . First trimester bleeding 2008  . GBS carrier   . GERD (gastroesophageal reflux disease)   . Granuloma annulare   . H/O amenorrhea 10/2006  . H/O blood clots    7 yr ago in arm  . H/O varicella  As an infant.  . Headache(784.0)   . Hearing loss 08/15/2016  . Heart murmur   . HTN (hypertension)   . Hyperlipidemia 03/02/2017  . IBS (irritable bowel syndrome) 08/15/2016  . Irregular uterine bleeding   . Joint pain   . Lactose intolerance   . Leg edema   . Leg pain, left 02/09/2016  . Monilial vaginitis 07/27/04  . Palpitations   . Rh negative status during pregnancy   . SAB (spontaneous abortion)    h/o x 2  . Seasonal allergies   . Sleep apnea 08/15/2016  . TMJ syndrome   . Vegetarianism 08/15/2016  . Vitamin D deficiency 08/16/2015    PAST SURGICAL HISTORY: Past Surgical History:  Procedure Laterality Date  . ABDOMINAL HYSTERECTOMY  2013  . APPENDECTOMY  2002  . DILATION AND  EVACUATION    . endocervical polyp removal  2001  . ENDOMETRIAL ABLATION  2010  . HERNIA REPAIR  05/16/12   incisional hernia repair  . HYSTEROSCOPY W/ ENDOMETRIAL ABLATION  01/28/2009  . INSERTION OF MESH N/A 05/16/2012   Procedure: INSERTION OF MESH;  Surgeon: Joyice Faster. Cornett, MD;  Location: [place] [facility];  Service: General;  Laterality: N/A;  . KNEE ARTHROSCOPY Left 12/05/2012   Procedure: LEFT KNEE ARTHROSCOPY;  Surgeon: Kerin Salen, MD;  Location: [place];  Service: Orthopedics;  Laterality: Left;  [name] MELANOMA EXCISION  2002   Left Leg  . melanoma removal   2003  . SALPINGOOPHORECTOMY  12/08/2011   Procedure: SALPINGO OOPHERECTOMY;  Surgeon: Eldred Manges, MD;  Location: [facility];  Service: Gynecology;  Laterality: Bilateral;  . TENDON RELEASE  2004  . uterine curettage  01/2009  . VENTRAL HERNIA REPAIR N/A 05/16/2012   Procedure: LAPAROSCOPIC VENTRAL HERNIA;  Surgeon: Joyice Faster. Cornett, MD;  Location: [place];  Service: General;  Laterality: N/A;  . WISDOM TOOTH EXTRACTION  1989    SOCIAL HISTORY: Social History   Tobacco Use  . Smoking status: Former Smoker    Types: Cigarettes    Last attempt to quit: 02/29/1988    Years since quitting: 29.4  . Smokeless tobacco: Never Used  Substance Use Topics  . Alcohol use: No  . Drug use: No    FAMILY HISTORY: Family History  Problem Relation Age of Onset  . Hypertension Mother   . Cancer Mother        breast  . Heart disease Mother   . Anxiety disorder Mother   . Heart disease Father        atrial fib  . Hypertension Father   . Cancer Father        Lyphoma,Bladder  . Atrial fibrillation Father   . Obesity Father   . Cancer Maternal Grandmother        breast  . Heart disease Maternal Grandfather        MI  . Heart disease Paternal Grandfather        MI  . Cancer Maternal Aunt        ovarian  . Diverticulitis Brother     ROS: Review of Systems  Constitutional: Positive for malaise/fatigue and weight  loss.  Cardiovascular: Negative for chest pain.       Negative for chest pressure  Gastrointestinal: Negative for nausea and vomiting.  Musculoskeletal:       Negative for muscle weakness  Neurological: Positive for headaches.    PHYSICAL EXAM: Blood pressure 113/77, pulse 63, temperature 98 F (36.7 C), temperature source  Oral, height 5\' 4"  (1.626 m), weight 196 lb (88.9 kg), last menstrual period 02/22/2011, SpO2 98 %. Body mass index is 33.64 kg/m. Physical Exam  Constitutional: She is oriented to person, place, and time. She appears well-developed and well-nourished.  Cardiovascular: Normal rate.  Pulmonary/Chest: Effort normal.  Musculoskeletal: Normal range of motion.  Neurological: She is oriented to person, place, and time.  Skin: Skin is warm and dry.  Psychiatric: She has a normal mood and affect. Her behavior is normal.  Vitals reviewed.   RECENT LABS AND TESTS: BMET    Component Value Date/Time   NA 141 06/20/2017 1155   K 4.5 06/20/2017 1155   CL 99 06/20/2017 1155   CO2 26 06/20/2017 1155   GLUCOSE 91 06/20/2017 1155   GLUCOSE 105 (H) 03/02/2017 0844   BUN 9 06/20/2017 1155   CREATININE 0.56 (L) 06/20/2017 1155   CALCIUM 10.0 06/20/2017 1155   GFRNONAA 111 06/20/2017 1155   GFRAA 128 06/20/2017 1155   Lab Results  Component Value Date   HGBA1C 5.2 06/20/2017   Lab Results  Component Value Date   INSULIN 10.7 06/20/2017   CBC    Component Value Date/Time   WBC 8.7 06/20/2017 1155   WBC 7.0 03/02/2017 0844   RBC 4.77 06/20/2017 1155   RBC 5.10 03/02/2017 0844   HGB 14.2 06/20/2017 1155   HCT 41.8 06/20/2017 1155   PLT 364.0 03/02/2017 0844   MCV 88 06/20/2017 1155   MCH 29.8 06/20/2017 1155   MCH 29.0 05/17/2012 0640   MCHC 34.0 06/20/2017 1155   MCHC 33.4 03/02/2017 0844   RDW 13.7 06/20/2017 1155   LYMPHSABS 2.5 06/20/2017 1155   MONOABS 0.5 02/02/2016 0926   EOSABS 0.4 06/20/2017 1155   BASOSABS 0.0 06/20/2017 1155    Iron/TIBC/Ferritin/ %Sat No results found for: IRON, TIBC, FERRITIN, IRONPCTSAT Lipid Panel     Component Value Date/Time   CHOL 159 06/20/2017 1155   TRIG 100 06/20/2017 1155   HDL 47 06/20/2017 1155   CHOLHDL 4 03/02/2017 0844   VLDL 21.2 03/02/2017 0844   LDLCALC 92 06/20/2017 1155   Hepatic Function Panel     Component Value Date/Time   PROT 7.0 06/20/2017 1155   ALBUMIN 4.7 06/20/2017 1155   AST 15 06/20/2017 1155   ALT 18 06/20/2017 1155   ALKPHOS 73 06/20/2017 1155   BILITOT 0.5 06/20/2017 1155   BILIDIR 0.1 01/28/2015 0946      Component Value Date/Time   TSH 1.050 06/20/2017 1155   TSH 1.79 03/02/2017 0844   TSH 1.05 08/15/2016 1036   Results for KENNETTA, PAVLOVIC (MRN [id number]) as of 08/10/2017 10:48  Ref. Range 06/20/2017 11:55  Vitamin D, 25-Hydroxy Latest Ref Range: 30.0 - 100.0 ng/mL 28.2 (L)   ASSESSMENT AND PLAN: Chronic nonintractable headache, unspecified headache type  Essential hypertension  Vitamin D deficiency  Class 1 obesity with serious comorbidity and body mass index (BMI) of 33.0 to 33.9 in adult, unspecified obesity type  PLAN:  Vitamin D Deficiency Eliany was informed that low vitamin D levels contributes to fatigue and are associated with obesity, breast, and colon cancer. She agrees to continue to take prescription Vit D @50 ,000 IU every week (no refill needed) and will follow up for routine testing of vitamin D, at least 2-3 times per year. She was informed of the risk of over-replacement of vitamin D and agrees to not increase her dose unless she discusses this with [place] first.  Hypertension We  discussed sodium restriction, working on healthy weight loss, and a regular exercise program as the means to achieve improved blood pressure control. [name] agreed with this plan and agreed to follow up as directed. We will continue to monitor her blood pressure as well as her progress with the above lifestyle modifications. Ezella is to check blood  pressure 2 to 3 times for 2 weeks and bring in her readings. She is to stay on the current lisinopril doses. Alexis will watch for signs of hypotension as she continues her lifestyle modifications.  Headaches Christianna is to keep track of headache; timing, blood pressure and severity. Simmie agrees to follow up with our clinic in 2 weeks.   We spent > than 50% of the 15 minute visit on the counseling as documented in the note.  Obesity Mikena is currently in the action stage of change. As such, her goal is to continue with weight loss efforts She has agreed to keep a food journal with 1200 to 1300 calories and 75+ grams of protein daily [place] has been instructed to work up to a goal of 150 minutes of combined cardio and strengthening exercise per week for weight loss and overall health benefits. We discussed the following Behavioral Modification Strategies today: planning for success, keep a strict food journal, increasing lean protein intake, increasing vegetables and work on meal planning and easy cooking plans  Adaline has agreed to follow up with our clinic in 2 weeks. She was informed of the importance of frequent follow up visits to maximize her success with intensive lifestyle modifications for her multiple health conditions.   OBESITY BEHAVIORAL INTERVENTION VISIT  Today's visit was # 4 out of 22.  Starting weight: 204 lbs Starting date: 06/20/17 Today's weight : 196 lbs  Today's date: 08/10/2017 Total lbs lost to date: 8 (Patients must lose 7 lbs in the first 6 months to continue with counseling)   ASK: We discussed the diagnosis of obesity with [name] today and [name] agreed to give [place] permission to discuss obesity behavioral modification therapy today.  ASSESS: [place] has the diagnosis of obesity and her BMI today is 33.63 Kaidan is in the action stage of change   ADVISE: Aspyn was educated on the multiple health risks of obesity as well as the benefit of weight loss to  improve her health. She was advised of the need for long term treatment and the importance of lifestyle modifications.  AGREE: Multiple dietary modification options and treatment options were discussed and  Jennetta agreed to the above obesity treatment plan.  I, Doreene Nest, am acting as transcriptionist for Eber Jones, MD  I have reviewed the above documentation for accuracy and completeness, and I agree with the above. - Ilene Qua, MD

## 2017-08-21 ENCOUNTER — Encounter: Payer: Self-pay | Admitting: Family Medicine

## 2017-08-24 ENCOUNTER — Ambulatory Visit (INDEPENDENT_AMBULATORY_CARE_PROVIDER_SITE_OTHER): Payer: 59 | Admitting: Family Medicine

## 2017-08-24 VITALS — BP 114/80 | HR 68 | Temp 97.8°F | Ht 64.0 in | Wt 198.0 lb

## 2017-08-24 DIAGNOSIS — E669 Obesity, unspecified: Secondary | ICD-10-CM

## 2017-08-24 DIAGNOSIS — Z6834 Body mass index (BMI) 34.0-34.9, adult: Secondary | ICD-10-CM

## 2017-08-24 DIAGNOSIS — G4489 Other headache syndrome: Secondary | ICD-10-CM

## 2017-08-24 DIAGNOSIS — I10 Essential (primary) hypertension: Secondary | ICD-10-CM

## 2017-08-24 NOTE — Progress Notes (Signed)
Office: [phone]  /  Fax: [phone]   HPI:   Chief Complaint: OBESITY [name] is here to discuss her progress with her obesity treatment plan. She is on the  keep a food journal with 1200 to 1300 calories and 75+ grams of protein daily and is following her eating plan approximately 50 % of the time. She states she is exercising with a personal trainer for 30 minutes 2 times per week. [name] didn't overeat but she found it hard to get protein in and stay within her calories. Her weight is 198 lb (89.8 kg) today and has had a weight gain of 2 pounds over a period of 2 weeks since her last visit. She has lost 6 lbs since starting treatment with [place].  Headache [name] had a headache on and off for the past month, throughout the day, relieved by Ibuprofen.  Hypertension [name] is a 47 y.o. female with hypertension. [name] brought her blood pressure cuff in  And all her readings are controlled at 110-130/70-80. [name] denies chest pain or shortness of breath on exertion. She is working weight loss to help control her blood pressure with the goal of decreasing her risk of heart attack and stroke. [name] blood pressure is controlled today.  ALLERGIES: Allergies  Allergen Reactions  . Betadine [Povidone Iodine] Other (See Comments)    Dx with allergy testing  . Cymbalta [Duloxetine Hcl] Other (See Comments)    Makes her feel crazy, out of body  . Iodine   . Shellfish Allergy Other (See Comments)    Dx with allergy testing    MEDICATIONS: Current Outpatient Medications on File Prior to Visit  Medication Sig Dispense Refill  . estradiol (VIVELLE-DOT) 0.05 MG/24HR Place 1 patch onto the skin 2 (two) times a week. Monday and Thursday    . guaiFENesin (MUCINEX) 600 MG 12 hr tablet Take 600 mg by mouth 2 (two) times daily as needed.    [name] ibuprofen (ADVIL,MOTRIN) 200 MG tablet Take 200 mg by mouth every 6 (six) hours as needed.    [name] lisinopril (PRINIVIL,ZESTRIL) 10 MG tablet  Take 1 tablet (10 mg total) by mouth 2 (two) times daily. 180 tablet 1  . Multiple Vitamins-Minerals (MULTIVITAMIN ADULT PO) Take by mouth.    . Vitamin D, Ergocalciferol, (DRISDOL) 50000 units CAPS capsule Take 1 capsule (50,000 Units total) by mouth every 7 (seven) days. 4 capsule 0   No current facility-administered medications on file prior to visit.     PAST MEDICAL HISTORY: Past Medical History:  Diagnosis Date  . Acute mastitis of left breast 10/04/2007  . AMA (advanced maternal age) multigravida 80+   . Cancer ([place]) 2002   Left leg  . Celiac disease   . Chronic back pain    H/O  . Complication of anesthesia    Difficult time waking up  . Constipation in pregnancy 03/2006  . Endometriosis    h/o  . Environmental allergies   . Fibroid    During first pregnancy  . Fibromyalgia   . First trimester bleeding 2008  . GBS carrier   . GERD (gastroesophageal reflux disease)   . Granuloma annulare   . H/O amenorrhea 10/2006  . H/O blood clots    7 yr ago in arm  . H/O varicella    As an infant.  . Headache(784.0)   . Hearing loss 08/15/2016  . Heart murmur   . HTN (hypertension)   . Hyperlipidemia 03/02/2017  . IBS (  irritable bowel syndrome) 08/15/2016  . Irregular uterine bleeding   . Joint pain   . Lactose intolerance   . Leg edema   . Leg pain, left 02/09/2016  . Monilial vaginitis 07/27/04  . Palpitations   . Rh negative status during pregnancy   . SAB (spontaneous abortion)    h/o x 2  . Seasonal allergies   . Sleep apnea 08/15/2016  . TMJ syndrome   . Vegetarianism 08/15/2016  . Vitamin D deficiency 08/16/2015    PAST SURGICAL HISTORY: Past Surgical History:  Procedure Laterality Date  . ABDOMINAL HYSTERECTOMY  2013  . APPENDECTOMY  2002  . DILATION AND EVACUATION    . endocervical polyp removal  2001  . ENDOMETRIAL ABLATION  2010  . HERNIA REPAIR  05/16/12   incisional hernia repair  . HYSTEROSCOPY W/ ENDOMETRIAL ABLATION  01/28/2009  . INSERTION OF MESH  N/A 05/16/2012   Procedure: INSERTION OF MESH;  Surgeon: Joyice Faster. Cornett, MD;  Location: [facility];  Service: General;  Laterality: N/A;  . KNEE ARTHROSCOPY Left 12/05/2012   Procedure: LEFT KNEE ARTHROSCOPY;  Surgeon: Kerin Salen, MD;  Location: [facility];  Service: Orthopedics;  Laterality: Left;  [name] MELANOMA EXCISION  2002   Left Leg  . melanoma removal   2003  . SALPINGOOPHORECTOMY  12/08/2011   Procedure: SALPINGO OOPHERECTOMY;  Surgeon: Eldred Manges, MD;  Location: [facility];  Service: Gynecology;  Laterality: Bilateral;  . TENDON RELEASE  2004  . uterine curettage  01/2009  . VENTRAL HERNIA REPAIR N/A 05/16/2012   Procedure: LAPAROSCOPIC VENTRAL HERNIA;  Surgeon: Joyice Faster. Cornett, MD;  Location: [facility];  Service: General;  Laterality: N/A;  . WISDOM TOOTH EXTRACTION  1989    SOCIAL HISTORY: Social History   Tobacco Use  . Smoking status: Former Smoker    Types: Cigarettes    Last attempt to quit: 02/29/1988    Years since quitting: 29.5  . Smokeless tobacco: Never Used  Substance Use Topics  . Alcohol use: No  . Drug use: No    FAMILY HISTORY: Family History  Problem Relation Age of Onset  . Hypertension Mother   . Cancer Mother        breast  . Heart disease Mother   . Anxiety disorder Mother   . Heart disease Father        atrial fib  . Hypertension Father   . Cancer Father        Lyphoma,Bladder  . Atrial fibrillation Father   . Obesity Father   . Cancer Maternal Grandmother        breast  . Heart disease Maternal Grandfather        MI  . Heart disease Paternal Grandfather        MI  . Cancer Maternal Aunt        ovarian  . Diverticulitis Brother     ROS: Review of Systems  Constitutional: Negative for weight loss.  Respiratory: Negative for shortness of breath (on exertion).   Cardiovascular: Negative for chest pain.  Neurological: Positive for headaches.    PHYSICAL EXAM: Blood pressure 114/80, pulse 68, temperature 97.8 F  (36.6 C), temperature source Oral, height 5\' 4"  (1.626 m), weight 198 lb (89.8 kg), last menstrual period 02/22/2011, SpO2 98 %. Body mass index is 33.99 kg/m. Physical Exam  Constitutional: She is oriented to person, place, and time. She appears well-developed and well-nourished.  Cardiovascular: Normal rate.  Pulmonary/Chest: Effort normal.  Musculoskeletal: Normal range of motion.  Neurological: She is oriented to person, place, and time.  Skin: Skin is warm and dry.  Psychiatric: She has a normal mood and affect. Her behavior is normal.  Vitals reviewed.   RECENT LABS AND TESTS: BMET    Component Value Date/Time   NA 141 06/20/2017 1155   K 4.5 06/20/2017 1155   CL 99 06/20/2017 1155   CO2 26 06/20/2017 1155   GLUCOSE 91 06/20/2017 1155   GLUCOSE 105 (H) 03/02/2017 0844   BUN 9 06/20/2017 1155   CREATININE 0.56 (L) 06/20/2017 1155   CALCIUM 10.0 06/20/2017 1155   GFRNONAA 111 06/20/2017 1155   GFRAA 128 06/20/2017 1155   Lab Results  Component Value Date   HGBA1C 5.2 06/20/2017   Lab Results  Component Value Date   INSULIN 10.7 06/20/2017   CBC    Component Value Date/Time   WBC 8.7 06/20/2017 1155   WBC 7.0 03/02/2017 0844   RBC 4.77 06/20/2017 1155   RBC 5.10 03/02/2017 0844   HGB 14.2 06/20/2017 1155   HCT 41.8 06/20/2017 1155   PLT 364.0 03/02/2017 0844   MCV 88 06/20/2017 1155   MCH 29.8 06/20/2017 1155   MCH 29.0 05/17/2012 0640   MCHC 34.0 06/20/2017 1155   MCHC 33.4 03/02/2017 0844   RDW 13.7 06/20/2017 1155   LYMPHSABS 2.5 06/20/2017 1155   MONOABS 0.5 02/02/2016 0926   EOSABS 0.4 06/20/2017 1155   BASOSABS 0.0 06/20/2017 1155   Iron/TIBC/Ferritin/ %Sat No results found for: IRON, TIBC, FERRITIN, IRONPCTSAT Lipid Panel     Component Value Date/Time   CHOL 159 06/20/2017 1155   TRIG 100 06/20/2017 1155   HDL 47 06/20/2017 1155   CHOLHDL 4 03/02/2017 0844   VLDL 21.2 03/02/2017 0844   LDLCALC 92 06/20/2017 1155   Hepatic Function  Panel     Component Value Date/Time   PROT 7.0 06/20/2017 1155   ALBUMIN 4.7 06/20/2017 1155   AST 15 06/20/2017 1155   ALT 18 06/20/2017 1155   ALKPHOS 73 06/20/2017 1155   BILITOT 0.5 06/20/2017 1155   BILIDIR 0.1 01/28/2015 0946      Component Value Date/Time   TSH 1.050 06/20/2017 1155   TSH 1.79 03/02/2017 0844   TSH 1.05 08/15/2016 1036   Results for SHANICA, CASTELLANOS (MRN [id number]) as of 08/24/2017 11:37  Ref. Range 06/20/2017 11:55  Vitamin D, 25-Hydroxy Latest Ref Range: 30.0 - 100.0 ng/mL 28.2 (L)   ASSESSMENT AND PLAN: Other headache syndrome  Essential hypertension  Class 1 obesity with serious comorbidity and body mass index (BMI) of 34.0 to 34.9 in adult, unspecified obesity type  PLAN:  Headache Reagan is to follow up with her PCP. She is to reach out to [facility] for osteopathic manipulative medicine. Neomi agrees to follow up with our clinic in 2 weeks.  Hypertension We discussed sodium restriction, working on healthy weight loss, and a regular exercise program as the means to achieve improved blood pressure control. [name] agreed with this plan and agreed to follow up as directed. We will continue to monitor her blood pressure as well as her progress with the above lifestyle modifications. She will continue Lisinopril and will watch for signs of hypotension as she continues her lifestyle modifications.  We spent > than 50% of the 15 minute visit on the counseling as documented in the note.  Obesity Telma is currently in the action stage of change. As such, her goal is  to continue with weight loss efforts She has agreed to keep a food journal with 1200 to 1300 calories and 75+ grams of protein daily [place] has been instructed to work up to a goal of 150 minutes of combined cardio and strengthening exercise per week for weight loss and overall health benefits. We discussed the following Behavioral Modification Strategies today: better snacking  choices, planning for success, increasing lean protein intake, increasing vegetables and work on meal planning and easy cooking plans  Raeanne has agreed to follow up with our clinic in 2 weeks. She was informed of the importance of frequent follow up visits to maximize her success with intensive lifestyle modifications for her multiple health conditions.   OBESITY BEHAVIORAL INTERVENTION VISIT  Today's visit was # 5 out of 22.  Starting weight: 204 lbs Starting date: 06/20/17 Today's weight : 198 lbs  Today's date: 08/24/2017 Total lbs lost to date: 6 (Patients must lose 7 lbs in the first 6 months to continue with counseling)   ASK: We discussed the diagnosis of obesity with [name] today and [name] agreed to give [place] permission to discuss obesity behavioral modification therapy today.  ASSESS: Christmas has the diagnosis of obesity and her BMI today is 33.97 Remonia is in the action stage of change   ADVISE: Roxine was educated on the multiple health risks of obesity as well as the benefit of weight loss to improve her health. She was advised of the need for long term treatment and the importance of lifestyle modifications.  AGREE: Multiple dietary modification options and treatment options were discussed and  Nicholas agreed to the above obesity treatment plan.  I, Doreene Nest, am acting as transcriptionist for Eber Jones, MD  I have reviewed the above documentation for accuracy and completeness, and I agree with the above. - Ilene Qua, MD

## 2017-09-01 ENCOUNTER — Encounter: Payer: Self-pay | Admitting: Family Medicine

## 2017-09-01 ENCOUNTER — Ambulatory Visit (INDEPENDENT_AMBULATORY_CARE_PROVIDER_SITE_OTHER): Payer: 59 | Admitting: Family Medicine

## 2017-09-01 VITALS — BP 118/63 | HR 63 | Temp 98.1°F | Ht 64.5 in | Wt 206.6 lb

## 2017-09-01 DIAGNOSIS — K589 Irritable bowel syndrome without diarrhea: Secondary | ICD-10-CM | POA: Diagnosis not present

## 2017-09-01 DIAGNOSIS — E559 Vitamin D deficiency, unspecified: Secondary | ICD-10-CM | POA: Diagnosis not present

## 2017-09-01 DIAGNOSIS — Z0000 Encounter for general adult medical examination without abnormal findings: Secondary | ICD-10-CM | POA: Diagnosis not present

## 2017-09-01 DIAGNOSIS — E785 Hyperlipidemia, unspecified: Secondary | ICD-10-CM

## 2017-09-01 DIAGNOSIS — Z889 Allergy status to unspecified drugs, medicaments and biological substances status: Secondary | ICD-10-CM

## 2017-09-01 DIAGNOSIS — R51 Headache: Secondary | ICD-10-CM | POA: Diagnosis not present

## 2017-09-01 DIAGNOSIS — M2550 Pain in unspecified joint: Secondary | ICD-10-CM

## 2017-09-01 DIAGNOSIS — R109 Unspecified abdominal pain: Secondary | ICD-10-CM

## 2017-09-01 DIAGNOSIS — R519 Headache, unspecified: Secondary | ICD-10-CM

## 2017-09-01 DIAGNOSIS — M546 Pain in thoracic spine: Secondary | ICD-10-CM

## 2017-09-01 DIAGNOSIS — G8929 Other chronic pain: Secondary | ICD-10-CM

## 2017-09-01 MED ORDER — HYOSCYAMINE SULFATE 0.125 MG SL SUBL: 0.1250 mg | SUBLINGUAL_TABLET | SUBLINGUAL | 1 refills | Status: DC | PRN

## 2017-09-01 NOTE — Assessment & Plan Note (Signed)
Abdominal cramps and diarrhea worse with dairy. She is encouraged to see gastroenterology and an allergist.

## 2017-09-01 NOTE — Patient Instructions (Signed)
Consider supplying [place] with a copy of your advanced directives Preventive Care 40-64 Years, Female Preventive care refers to lifestyle choices and visits with your health care provider that can promote health and wellness. What does preventive care include?  A yearly physical exam. This is also called an annual well check.  Dental exams once or twice a year.  Routine eye exams. Ask your health care provider how often you should have your eyes checked.  Personal lifestyle choices, including: ? Daily care of your teeth and gums. ? Regular physical activity. ? Eating a healthy diet. ? Avoiding tobacco and drug use. ? Limiting alcohol use. ? Practicing safe sex. ? Taking low-dose aspirin daily starting at age 30. ? Taking vitamin and mineral supplements as recommended by your health care provider. What happens during an annual well check? The services and screenings done by your health care provider during your annual well check will depend on your age, overall health, lifestyle risk factors, and family history of disease. Counseling Your health care provider may ask you questions about your:  Alcohol use.  Tobacco use.  Drug use.  Emotional well-being.  Home and relationship well-being.  Sexual activity.  Eating habits.  Work and work Statistician.  Method of birth control.  Menstrual cycle.  Pregnancy history.  Screening You may have the following tests or measurements:  Height, weight, and BMI.  Blood pressure.  Lipid and cholesterol levels. These may be checked every 5 years, or more frequently if you are over 66 years old.  Skin check.  Lung cancer screening. You may have this screening every year starting at age 84 if you have a 30-pack-year history of smoking and currently smoke or have quit within the past 15 years.  Fecal occult blood test (FOBT) of the stool. You may have this test every year starting at age 21.  Flexible sigmoidoscopy or colonoscopy.  You may have a sigmoidoscopy every 5 years or a colonoscopy every 10 years starting at age 44.  Hepatitis C blood test.  Hepatitis B blood test.  Sexually transmitted disease (STD) testing.  Diabetes screening. This is done by checking your blood sugar (glucose) after you have not eaten for a while (fasting). You may have this done every 1-3 years.  Mammogram. This may be done every 1-2 years. Talk to your health care provider about when you should start having regular mammograms. This may depend on whether you have a family history of breast cancer.  BRCA-related cancer screening. This may be done if you have a family history of breast, ovarian, tubal, or peritoneal cancers.  Pelvic exam and Pap test. This may be done every 3 years starting at age 101. Starting at age 48, this may be done every 5 years if you have a Pap test in combination with an HPV test.  Bone density scan. This is done to screen for osteoporosis. You may have this scan if you are at high risk for osteoporosis.  Discuss your test results, treatment options, and if necessary, the need for more tests with your health care provider. Vaccines Your health care provider may recommend certain vaccines, such as:  Influenza vaccine. This is recommended every year.  Tetanus, diphtheria, and acellular pertussis (Tdap, Td) vaccine. You may need a Td booster every 10 years.  Varicella vaccine. You may need this if you have not been vaccinated.  Zoster vaccine. You may need this after age 50.  Measles, mumps, and rubella (MMR) vaccine. You may need at  least one dose of MMR if you were born in 1957 or later. You may also need a second dose.  Pneumococcal 13-valent conjugate (PCV13) vaccine. You may need this if you have certain conditions and were not previously vaccinated.  Pneumococcal polysaccharide (PPSV23) vaccine. You may need one or two doses if you smoke cigarettes or if you have certain conditions.  Meningococcal  vaccine. You may need this if you have certain conditions.  Hepatitis A vaccine. You may need this if you have certain conditions or if you travel or work in places where you may be exposed to hepatitis A.  Hepatitis B vaccine. You may need this if you have certain conditions or if you travel or work in places where you may be exposed to hepatitis B.  Haemophilus influenzae type b (Hib) vaccine. You may need this if you have certain conditions.  Talk to your health care provider about which screenings and vaccines you need and how often you need them. This information is not intended to replace advice given to you by your health care provider. Make sure you discuss any questions you have with your health care provider. Document Released: 03/13/2015 Document Revised: 11/04/2015 Document Reviewed: 12/16/2014 [name]  [name].

## 2017-09-01 NOTE — Assessment & Plan Note (Signed)
Following with healthy weight and wellness but she is struggling with the diet since she is a vegetarian and they are encouraging some massive amounts of dairy and that is causing abdominal pain, spasms regularly.

## 2017-09-01 NOTE — Assessment & Plan Note (Signed)
Encouraged increased hydration, 64 ounces of clear fluids daily. Minimize alcohol and caffeine. Eat small frequent meals with lean proteins and complex carbs. Avoid high and low blood sugars. Get adequate sleep, 7-8 hours a night. Needs exercise daily preferably in the morning.had several weeks of headaches after stopping her HCTZ but it stopped a couple days ago. Check a Lyme titer.

## 2017-09-01 NOTE — Assessment & Plan Note (Signed)
Encouraged heart healthy diet, increase exercise, avoid trans fats, consider a krill oil cap daily 

## 2017-09-01 NOTE — Progress Notes (Signed)
Subjective:    Patient ID: [name], female    DOB: 04-Nov-1970, 47 y.o.   MRN: [id number]  Chief Complaint  Patient presents with  . Annual Exam    Pt here for CPE.     HPI Patient is in today for annual preventative exam and follow up on chronic medical concerns including hyperlipidemia, obesity, and vitamin D deficiency. She is noting chronic pain in back shoulders, knees and hand pain at times. Not too bad presently. Her brother was just diagnosed with chronic lyme disease up in [place] and she is worried about this. Denies CP/palp/SOB/congestion/fevers/GI or GU c/o. Taking meds as prescribed. Does have chronic headaches but it is better today. Also notes chronic back, nec, shoulder, knee pain.   Past Medical History:  Diagnosis Date  . Acute mastitis of left breast 10/04/2007  . AMA (advanced maternal age) multigravida 41+   . Cancer ([place]) 2002   Left leg  . Celiac disease   . Chronic back pain    H/O  . Complication of anesthesia    Difficult time waking up  . Constipation in pregnancy 03/2006  . Endometriosis    h/o  . Environmental allergies   . Fibroid    During first pregnancy  . Fibromyalgia   . First trimester bleeding 2008  . GBS carrier   . GERD (gastroesophageal reflux disease)   . Granuloma annulare   . H/O amenorrhea 10/2006  . H/O blood clots    7 yr ago in arm  . H/O varicella    As an infant.  . Headache(784.0)   . Hearing loss 08/15/2016  . Heart murmur   . HTN (hypertension)   . Hyperlipidemia 03/02/2017  . IBS (irritable bowel syndrome) 08/15/2016  . Irregular uterine bleeding   . Joint pain   . Lactose intolerance   . Leg edema   . Leg pain, left 02/09/2016  . Monilial vaginitis 07/27/04  . Palpitations   . Rh negative status during pregnancy   . SAB (spontaneous abortion)    h/o x 2  . Seasonal allergies   . Sleep apnea 08/15/2016  . TMJ syndrome   . Vegetarianism 08/15/2016  . Vitamin D deficiency 08/16/2015    Past Surgical  History:  Procedure Laterality Date  . ABDOMINAL HYSTERECTOMY  2013  . APPENDECTOMY  2002  . DILATION AND EVACUATION    . endocervical polyp removal  2001  . ENDOMETRIAL ABLATION  2010  . HERNIA REPAIR  05/16/12   incisional hernia repair  . HYSTEROSCOPY W/ ENDOMETRIAL ABLATION  01/28/2009  . INSERTION OF MESH N/A 05/16/2012   Procedure: INSERTION OF MESH;  Surgeon: Joyice Faster. Cornett, MD;  Location: [facility];  Service: General;  Laterality: N/A;  . KNEE ARTHROSCOPY Left 12/05/2012   Procedure: LEFT KNEE ARTHROSCOPY;  Surgeon: Kerin Salen, MD;  Location: [facility];  Service: Orthopedics;  Laterality: Left;  [name] MELANOMA EXCISION  2002   Left Leg  . melanoma removal   2003  . SALPINGOOPHORECTOMY  12/08/2011   Procedure: SALPINGO OOPHERECTOMY;  Surgeon: Eldred Manges, MD;  Location: [facility];  Service: Gynecology;  Laterality: Bilateral;  . TENDON RELEASE  2004  . uterine curettage  01/2009  . VENTRAL HERNIA REPAIR N/A 05/16/2012   Procedure: LAPAROSCOPIC VENTRAL HERNIA;  Surgeon: Joyice Faster. Cornett, MD;  Location: [facility];  Service: General;  Laterality: N/A;  . WISDOM TOOTH EXTRACTION  1989    Family History  Problem Relation  Age of Onset  . Hypertension Mother   . Cancer Mother        breast  . Heart disease Mother   . Anxiety disorder Mother   . Heart disease Father        atrial fib  . Hypertension Father   . Cancer Father        Lyphoma,Bladder  . Atrial fibrillation Father   . Obesity Father   . Cancer Maternal Grandmother        breast  . Heart disease Maternal Grandfather        MI  . Heart disease Paternal Grandfather        MI  . Cancer Maternal Aunt        ovarian  . Diverticulitis Brother     Social History   Socioeconomic History  . Marital status: Married    Spouse name: Psychiatric nurse  . Number of children: 3  . Years of education: Not on file  . Highest education level: Not on file  Occupational History  . Occupation: Armed forces operational officer    Social Needs  . Financial resource strain: Not on file  . Food insecurity:    Worry: Not on file    Inability: Not on file  . Transportation needs:    Medical: Not on file    Non-medical: Not on file  Tobacco Use  . Smoking status: Former Smoker    Types: Cigarettes    Last attempt to quit: 02/29/1988    Years since quitting: 29.5  . Smokeless tobacco: Never Used  Substance and Sexual Activity  . Alcohol use: No  . Drug use: No  . Sexual activity: Yes    Birth control/protection: Surgical    Comment: BTL  Lifestyle  . Physical activity:    Days per week: Not on file    Minutes per session: Not on file  . Stress: Not on file  Relationships  . Social connections:    Talks on phone: Not on file    Gets together: Not on file    Attends religious service: Not on file    Active member of club or organization: Not on file    Attends meetings of clubs or organizations: Not on file    Relationship status: Not on file  . Intimate partner violence:    Fear of current or ex partner: Not on file    Emotionally abused: Not on file    Physically abused: Not on file    Forced sexual activity: Not on file  Other Topics Concern  . Not on file  Social History Narrative   Lives with husband and 3 children   Owns a Animal nutritionist hospital   Vegetarian   Wears seat belt    Outpatient Medications Prior to Visit  Medication Sig Dispense Refill  . estradiol (VIVELLE-DOT) 0.05 MG/24HR Place 1 patch onto the skin 2 (two) times a week. Monday and Thursday    . ibuprofen (ADVIL,MOTRIN) 200 MG tablet Take 200 mg by mouth every 6 (six) hours as needed.    [name] lisinopril (PRINIVIL,ZESTRIL) 10 MG tablet Take 1 tablet (10 mg total) by mouth 2 (two) times daily. 180 tablet 1  . Multiple Vitamins-Minerals (MULTIVITAMIN ADULT PO) Take by mouth.    . Vitamin D, Ergocalciferol, (DRISDOL) 50000 units CAPS capsule Take 1 capsule (50,000 Units total) by mouth every 7 (seven) days. 4 capsule 0  . guaiFENesin  (MUCINEX) 600 MG 12 hr tablet Take 600 mg by mouth 2 (  two) times daily as needed.     No facility-administered medications prior to visit.     Allergies  Allergen Reactions  . Betadine [Povidone Iodine] Other (See Comments)    Dx with allergy testing  . Cymbalta [Duloxetine Hcl] Other (See Comments)    Makes her feel crazy, out of body  . Iodine   . Shellfish Allergy Other (See Comments)    Dx with allergy testing    Review of Systems  Constitutional: Negative for chills, fever and malaise/fatigue.  HENT: Negative for congestion and hearing loss.   Eyes: Negative for discharge.  Respiratory: Negative for cough, sputum production and shortness of breath.   Cardiovascular: Negative for chest pain, palpitations and leg swelling.  Gastrointestinal: Positive for abdominal pain. Negative for blood in stool, constipation, diarrhea, heartburn, nausea and vomiting.  Genitourinary: Negative for dysuria, frequency, hematuria and urgency.  Musculoskeletal: Negative for back pain, falls and myalgias.  Skin: Negative for rash.  Neurological: Negative for dizziness, sensory change, loss of consciousness, weakness and headaches.  Endo/Heme/Allergies: Negative for environmental allergies. Does not bruise/bleed easily.  Psychiatric/Behavioral: Negative for depression and suicidal ideas. The patient is not nervous/anxious and does not have insomnia.        Objective:    Physical Exam  Constitutional: She is oriented to person, place, and time. No distress.  HENT:  Head: Normocephalic and atraumatic.  Right Ear: External ear normal.  Left Ear: External ear normal.  Nose: Nose normal.  Mouth/Throat: Oropharynx is clear and moist. No oropharyngeal exudate.  Eyes: Pupils are equal, round, and reactive to light. Conjunctivae are normal. Right eye exhibits no discharge. Left eye exhibits no discharge. No scleral icterus.  Neck: Normal range of motion. Neck supple. No thyromegaly present.    Cardiovascular: Normal rate, regular rhythm, normal heart sounds and intact distal pulses.  No murmur heard. Pulmonary/Chest: Effort normal and breath sounds normal. No respiratory distress. She has no wheezes. She has no rales.  Abdominal: Soft. Bowel sounds are normal. She exhibits no distension and no mass. There is no tenderness.  Musculoskeletal: Normal range of motion. She exhibits no edema or tenderness.  Lymphadenopathy:    She has no cervical adenopathy.  Neurological: She is alert and oriented to person, place, and time. She has normal reflexes. She displays normal reflexes. No cranial nerve deficit. Coordination normal.  Skin: Skin is warm and dry. No rash noted. She is not diaphoretic.    BP 118/63 (BP Location: Left Arm, Patient Position: Sitting, Cuff Size: Large)   Pulse 63   Temp 98.1 F (36.7 C) (Oral)   Ht 5' 4.5" (1.638 m)   Wt 206 lb 9.6 oz (93.7 kg)   LMP 02/22/2011   SpO2 97%   BMI 34.92 kg/m  Wt Readings from Last 3 Encounters:  09/01/17 206 lb 9.6 oz (93.7 kg)  08/24/17 198 lb (89.8 kg)  08/10/17 196 lb (88.9 kg)     Lab Results  Component Value Date   WBC 8.7 06/20/2017   HGB 14.2 06/20/2017   HCT 41.8 06/20/2017   PLT 364.0 03/02/2017   GLUCOSE 91 06/20/2017   CHOL 159 06/20/2017   TRIG 100 06/20/2017   HDL 47 06/20/2017   LDLCALC 92 06/20/2017   ALT 18 06/20/2017   AST 15 06/20/2017   NA 141 06/20/2017   K 4.5 06/20/2017   CL 99 06/20/2017   CREATININE 0.56 (L) 06/20/2017   BUN 9 06/20/2017   CO2 26 06/20/2017   TSH 1.050  06/20/2017   HGBA1C 5.2 06/20/2017    Lab Results  Component Value Date   TSH 1.050 06/20/2017   Lab Results  Component Value Date   WBC 8.7 06/20/2017   HGB 14.2 06/20/2017   HCT 41.8 06/20/2017   MCV 88 06/20/2017   PLT 364.0 03/02/2017   Lab Results  Component Value Date   NA 141 06/20/2017   K 4.5 06/20/2017   CO2 26 06/20/2017   GLUCOSE 91 06/20/2017   BUN 9 06/20/2017   CREATININE 0.56 (L)  06/20/2017   BILITOT 0.5 06/20/2017   ALKPHOS 73 06/20/2017   AST 15 06/20/2017   ALT 18 06/20/2017   PROT 7.0 06/20/2017   ALBUMIN 4.7 06/20/2017   CALCIUM 10.0 06/20/2017   GFR 113.32 03/02/2017   Lab Results  Component Value Date   CHOL 159 06/20/2017   Lab Results  Component Value Date   HDL 47 06/20/2017   Lab Results  Component Value Date   LDLCALC 92 06/20/2017   Lab Results  Component Value Date   TRIG 100 06/20/2017   Lab Results  Component Value Date   CHOLHDL 4 03/02/2017   Lab Results  Component Value Date   HGBA1C 5.2 06/20/2017       Assessment & Plan:   Problem List Items Addressed This Visit    Obesity    Following with healthy weight and wellness but she is struggling with the diet since she is a vegetarian and they are encouraging some massive amounts of dairy and that is causing abdominal pain, spasms regularly.       Preventative health care    Patient encouraged to maintain heart healthy diet, regular exercise, adequate sleep. Consider daily probiotics. Take medications as prescribed      Vitamin D deficiency    Supplement and monitor      IBS (irritable bowel syndrome)    Abdominal cramps and diarrhea worse with dairy. She is encouraged to see gastroenterology and an allergist.       Relevant Medications   hyoscyamine (LEVSIN SL) 0.125 MG SL tablet   Other Relevant Orders   Ambulatory referral to Allergy   Hyperlipidemia    Encouraged heart healthy diet, increase exercise, avoid trans fats, consider a krill oil cap daily       Other Visit Diagnoses    Abdominal pain, unspecified abdominal location    -  Primary   Relevant Orders   Ambulatory referral to Allergy   Multiple allergies       Relevant Orders   Ambulatory referral to Allergy      I have discontinued [name]'s guaiFENesin. I am also having her start on hyoscyamine. Additionally, I am having her maintain her estradiol, Multiple Vitamins-Minerals  (MULTIVITAMIN ADULT PO), lisinopril, ibuprofen, and Vitamin D (Ergocalciferol).  Meds ordered this encounter  Medications  . hyoscyamine (LEVSIN SL) 0.125 MG SL tablet    Sig: Place 1 tablet (0.125 mg total) under the tongue every 4 (four) hours as needed.    Dispense:  30 tablet    Refill:  1     Penni Homans, MD

## 2017-09-01 NOTE — Assessment & Plan Note (Signed)
Supplement and monitor 

## 2017-09-01 NOTE — Assessment & Plan Note (Signed)
Still considers breast reduction due to chronic pain in neck, shoulders, thoracic back pain but unsure if her insurance will cover a breast reduction. She will look into it.

## 2017-09-01 NOTE — Assessment & Plan Note (Signed)
Patient encouraged to maintain heart healthy diet, regular exercise, adequate sleep. Consider daily probiotics. Take medications as prescribed 

## 2017-09-01 NOTE — Assessment & Plan Note (Signed)
Has trouble with joint pain for years off and on. Check lyme titer. Shoulders, neck back, hands, knees at times. None today

## 2017-09-04 LAB — B. BURGDORFI ANTIBODIES: B burgdorferi Ab IgG+IgM: <0.9 index

## 2017-09-08 ENCOUNTER — Telehealth: Payer: Self-pay | Admitting: *Deleted

## 2017-09-08 NOTE — Telephone Encounter (Signed)
Received Medical records from [facility]; forwarded to provider/SLS 07/12

## 2017-09-14 ENCOUNTER — Ambulatory Visit (INDEPENDENT_AMBULATORY_CARE_PROVIDER_SITE_OTHER): Payer: 59 | Admitting: Family Medicine

## 2017-10-02 ENCOUNTER — Ambulatory Visit (INDEPENDENT_AMBULATORY_CARE_PROVIDER_SITE_OTHER): Payer: 59 | Admitting: Family Medicine

## 2017-10-02 VITALS — BP 123/83 | HR 74 | Temp 97.9°F | Ht 64.0 in | Wt 200.0 lb

## 2017-10-02 DIAGNOSIS — E559 Vitamin D deficiency, unspecified: Secondary | ICD-10-CM | POA: Diagnosis not present

## 2017-10-02 DIAGNOSIS — I10 Essential (primary) hypertension: Secondary | ICD-10-CM | POA: Diagnosis not present

## 2017-10-02 DIAGNOSIS — E669 Obesity, unspecified: Secondary | ICD-10-CM

## 2017-10-02 DIAGNOSIS — Z6834 Body mass index (BMI) 34.0-34.9, adult: Secondary | ICD-10-CM

## 2017-10-02 NOTE — Progress Notes (Signed)
Office: [phone]  /  Fax: [phone]   HPI:   Chief Complaint: OBESITY [name] is here to discuss her progress with her obesity treatment plan. She is on the keep a food journal with 1200-1300 calories and 75+ grams of protein daily and is following her eating plan approximately 50 % of the time. She states she has a person trainer for 30 minutes 2 times per week. [name] has been traveling quite a bit and struggled to journal. Finds eating at home to give her more control trying to eat vegan at home.  Her weight is 200 lb (90.7 kg) today and has gained 2 pounds since her last visit. She has lost 4 lbs since starting treatment with [place].  Vitamin D Deficiency [name] has a diagnosis of vitamin D deficiency. She is currently taking prescription Vit D. She notes fatigue and denies nausea, vomiting or muscle weakness.  Hypertension [name] is a 47 y.o. female with hypertension. [name]'s blood pressure is controlled today. She denies chest pain, chest pressure, or headache. She is working weight loss to help control her blood pressure with the goal of decreasing her risk of heart attack and stroke.  ALLERGIES: Allergies  Allergen Reactions  . Betadine [Povidone Iodine] Other (See Comments)    Dx with allergy testing  . Cymbalta [Duloxetine Hcl] Other (See Comments)    Makes her feel crazy, out of body  . Iodine   . Shellfish Allergy Other (See Comments)    Dx with allergy testing    MEDICATIONS: Current Outpatient Medications on File Prior to Visit  Medication Sig Dispense Refill  . estradiol (VIVELLE-DOT) 0.05 MG/24HR Place 1 patch onto the skin 2 (two) times a week. Monday and Thursday    . hyoscyamine (LEVSIN SL) 0.125 MG SL tablet Place 1 tablet (0.125 mg total) under the tongue every 4 (four) hours as needed. 30 tablet 1  . ibuprofen (ADVIL,MOTRIN) 200 MG tablet Take 200 mg by mouth every 6 (six) hours as needed.    [name] lisinopril (PRINIVIL,ZESTRIL) 10 MG tablet Take 1  tablet (10 mg total) by mouth 2 (two) times daily. 180 tablet 1  . Multiple Vitamins-Minerals (MULTIVITAMIN ADULT PO) Take by mouth.    . Vitamin D, Ergocalciferol, (DRISDOL) 50000 units CAPS capsule Take 1 capsule (50,000 Units total) by mouth every 7 (seven) days. 4 capsule 0   No current facility-administered medications on file prior to visit.     PAST MEDICAL HISTORY: Past Medical History:  Diagnosis Date  . Acute mastitis of left breast 10/04/2007  . AMA (advanced maternal age) multigravida 57+   . Cancer ([place]) 2002   Left leg  . Celiac disease   . Chronic back pain    H/O  . Complication of anesthesia    Difficult time waking up  . Constipation in pregnancy 03/2006  . Endometriosis    h/o  . Environmental allergies   . Fibroid    During first pregnancy  . Fibromyalgia   . First trimester bleeding 2008  . GBS carrier   . GERD (gastroesophageal reflux disease)   . Granuloma annulare   . H/O amenorrhea 10/2006  . H/O blood clots    7 yr ago in arm  . H/O varicella    As an infant.  . Headache(784.0)   . Hearing loss 08/15/2016  . Heart murmur   . HTN (hypertension)   . Hyperlipidemia 03/02/2017  . IBS (irritable bowel syndrome) 08/15/2016  . Irregular uterine bleeding   .  Joint pain   . Lactose intolerance   . Leg edema   . Leg pain, left 02/09/2016  . Monilial vaginitis 07/27/04  . Palpitations   . Rh negative status during pregnancy   . SAB (spontaneous abortion)    h/o x 2  . Seasonal allergies   . Sleep apnea 08/15/2016  . TMJ syndrome   . Vegetarianism 08/15/2016  . Vitamin D deficiency 08/16/2015    PAST SURGICAL HISTORY: Past Surgical History:  Procedure Laterality Date  . ABDOMINAL HYSTERECTOMY  2013  . APPENDECTOMY  2002  . DILATION AND EVACUATION    . endocervical polyp removal  2001  . ENDOMETRIAL ABLATION  2010  . HERNIA REPAIR  05/16/12   incisional hernia repair  . HYSTEROSCOPY W/ ENDOMETRIAL ABLATION  01/28/2009  . INSERTION OF MESH N/A  05/16/2012   Procedure: INSERTION OF MESH;  Surgeon: Joyice Faster. Cornett, MD;  Location: [facility];  Service: General;  Laterality: N/A;  . KNEE ARTHROSCOPY Left 12/05/2012   Procedure: LEFT KNEE ARTHROSCOPY;  Surgeon: Kerin Salen, MD;  Location: [facility];  Service: Orthopedics;  Laterality: Left;  [name] MELANOMA EXCISION  2002   Left Leg  . melanoma removal   2003  . SALPINGOOPHORECTOMY  12/08/2011   Procedure: SALPINGO OOPHERECTOMY;  Surgeon: Eldred Manges, MD;  Location: [facility];  Service: Gynecology;  Laterality: Bilateral;  . TENDON RELEASE  2004  . uterine curettage  01/2009  . VENTRAL HERNIA REPAIR N/A 05/16/2012   Procedure: LAPAROSCOPIC VENTRAL HERNIA;  Surgeon: Joyice Faster. Cornett, MD;  Location: [facility];  Service: General;  Laterality: N/A;  . WISDOM TOOTH EXTRACTION  1989    SOCIAL HISTORY: Social History   Tobacco Use  . Smoking status: Former Smoker    Types: Cigarettes    Last attempt to quit: 02/29/1988    Years since quitting: 29.6  . Smokeless tobacco: Never Used  Substance Use Topics  . Alcohol use: No  . Drug use: No    FAMILY HISTORY: Family History  Problem Relation Age of Onset  . Hypertension Mother   . Cancer Mother        breast  . Heart disease Mother   . Anxiety disorder Mother   . Heart disease Father        atrial fib  . Hypertension Father   . Cancer Father        Lyphoma,Bladder  . Atrial fibrillation Father   . Obesity Father   . Cancer Maternal Grandmother        breast  . Heart disease Maternal Grandfather        MI  . Heart disease Paternal Grandfather        MI  . Cancer Maternal Aunt        ovarian  . Diverticulitis Brother     ROS: Review of Systems  Constitutional: Positive for malaise/fatigue. Negative for weight loss.  Cardiovascular: Negative for chest pain.       Negative chest pressure  Gastrointestinal: Negative for nausea and vomiting.  Musculoskeletal:       Negative muscle weakness  Neurological:  Negative for headaches.    PHYSICAL EXAM: Blood pressure 123/83, pulse 74, temperature 97.9 F (36.6 C), temperature source Oral, height 5\' 4"  (1.626 m), weight 200 lb (90.7 kg), last menstrual period 02/22/2011, SpO2 98 %. Body mass index is 34.33 kg/m. Physical Exam  Constitutional: She is oriented to person, place, and time. She appears well-developed and well-nourished.  Cardiovascular: Normal rate.  Pulmonary/Chest: Effort normal.  Musculoskeletal: Normal range of motion.  Neurological: She is oriented to person, place, and time.  Skin: Skin is warm and dry.  Psychiatric: She has a normal mood and affect. Her behavior is normal.  Vitals reviewed.   RECENT LABS AND TESTS: BMET    Component Value Date/Time   NA 141 06/20/2017 1155   K 4.5 06/20/2017 1155   CL 99 06/20/2017 1155   CO2 26 06/20/2017 1155   GLUCOSE 91 06/20/2017 1155   GLUCOSE 105 (H) 03/02/2017 0844   BUN 9 06/20/2017 1155   CREATININE 0.56 (L) 06/20/2017 1155   CALCIUM 10.0 06/20/2017 1155   GFRNONAA 111 06/20/2017 1155   GFRAA 128 06/20/2017 1155   Lab Results  Component Value Date   HGBA1C 5.2 06/20/2017   Lab Results  Component Value Date   INSULIN 10.7 06/20/2017   CBC    Component Value Date/Time   WBC 8.7 06/20/2017 1155   WBC 7.0 03/02/2017 0844   RBC 4.77 06/20/2017 1155   RBC 5.10 03/02/2017 0844   HGB 14.2 06/20/2017 1155   HCT 41.8 06/20/2017 1155   PLT 364.0 03/02/2017 0844   MCV 88 06/20/2017 1155   MCH 29.8 06/20/2017 1155   MCH 29.0 05/17/2012 0640   MCHC 34.0 06/20/2017 1155   MCHC 33.4 03/02/2017 0844   RDW 13.7 06/20/2017 1155   LYMPHSABS 2.5 06/20/2017 1155   MONOABS 0.5 02/02/2016 0926   EOSABS 0.4 06/20/2017 1155   BASOSABS 0.0 06/20/2017 1155   Iron/TIBC/Ferritin/ %Sat No results found for: IRON, TIBC, FERRITIN, IRONPCTSAT Lipid Panel     Component Value Date/Time   CHOL 159 06/20/2017 1155   TRIG 100 06/20/2017 1155   HDL 47 06/20/2017 1155   CHOLHDL 4  03/02/2017 0844   VLDL 21.2 03/02/2017 0844   LDLCALC 92 06/20/2017 1155   Hepatic Function Panel     Component Value Date/Time   PROT 7.0 06/20/2017 1155   ALBUMIN 4.7 06/20/2017 1155   AST 15 06/20/2017 1155   ALT 18 06/20/2017 1155   ALKPHOS 73 06/20/2017 1155   BILITOT 0.5 06/20/2017 1155   BILIDIR 0.1 01/28/2015 0946      Component Value Date/Time   TSH 1.050 06/20/2017 1155   TSH 1.79 03/02/2017 0844   TSH 1.05 08/15/2016 1036  Results for SOPHY, MESLER (MRN [id number]) as of 10/02/2017 14:23  Ref. Range 06/20/2017 11:55  Vitamin D, 25-Hydroxy Latest Ref Range: 30.0 - 100.0 ng/mL 28.2 (L)    ASSESSMENT AND PLAN: Vitamin D deficiency  Essential hypertension  Class 1 obesity with serious comorbidity and body mass index (BMI) of 34.0 to 34.9 in adult, unspecified obesity type  PLAN:  Vitamin D Deficiency Jametta was informed that low vitamin D levels contributes to fatigue and are associated with obesity, breast, and colon cancer. Skyeler agrees to continue taking prescription Vit D @50 ,000 IU every week, no refill needed. She will follow up for routine testing of vitamin D, at least 2-3 times per year. She was informed of the risk of over-replacement of vitamin D and agrees to not increase her dose unless she discusses this with [place] first. Cynara agrees to follow up with our clinic in 2 weeks.  Hypertension We discussed sodium restriction, working on healthy weight loss, and a regular exercise program as the means to achieve improved blood pressure control. [name] agreed with this plan and agreed to follow up as directed. We will continue to monitor  her blood pressure as well as her progress with the above lifestyle modifications. Cristen agrees to continue taking lisinopril and we will follow up on blood pressure at next appointment. She will watch for signs of hypotension as she continues her lifestyle modifications. Birdell agrees to follow up with our clinic in 2 weeks.  We spent  > than 50% of the 15 minute visit on the counseling as documented in the note.  Obesity Renita is currently in the action stage of change. As such, her goal is to continue with weight loss efforts She has agreed to keep a food journal with 1300 calories and 75+ grams of protein daily Ami has been instructed to work up to a goal of 150 minutes of combined cardio and strengthening exercise per week for weight loss and overall health benefits. We discussed the following Behavioral Modification Strategies today: increasing lean protein intake, increasing vegetables, work on meal planning and easy cooking plans, and planning for success   Teddi has agreed to follow up with our clinic in 2 weeks. She was informed of the importance of frequent follow up visits to maximize her success with intensive lifestyle modifications for her multiple health conditions.   OBESITY BEHAVIORAL INTERVENTION VISIT  Today's visit was # 6 out of 22.  Starting weight: 204 lbs Starting date: 06/20/17 Today's weight : 200 lbs Today's date: 10/02/2017 Total lbs lost to date: 4    ASK: We discussed the diagnosis of obesity with [name] today and [name] agreed to give [place] permission to discuss obesity behavioral modification therapy today.  ASSESS: Danaiya has the diagnosis of obesity and her BMI today is 34.31 Ambriella is in the action stage of change   ADVISE: Shalandria was educated on the multiple health risks of obesity as well as the benefit of weight loss to improve her health. She was advised of the need for long term treatment and the importance of lifestyle modifications.  AGREE: Multiple dietary modification options and treatment options were discussed and  Fred agreed to the above obesity treatment plan.  I, Trixie Dredge, am acting as transcriptionist for Ilene Qua, MD  I have reviewed the above documentation for accuracy and completeness, and I agree with the above. - Ilene Qua,  MD

## 2017-10-17 ENCOUNTER — Ambulatory Visit (INDEPENDENT_AMBULATORY_CARE_PROVIDER_SITE_OTHER): Payer: 59 | Admitting: Family Medicine

## 2017-10-17 VITALS — BP 121/83 | HR 67 | Temp 98.2°F | Ht 64.0 in | Wt 203.0 lb

## 2017-10-17 DIAGNOSIS — I10 Essential (primary) hypertension: Secondary | ICD-10-CM | POA: Diagnosis not present

## 2017-10-17 DIAGNOSIS — E559 Vitamin D deficiency, unspecified: Secondary | ICD-10-CM

## 2017-10-17 DIAGNOSIS — D224 Melanocytic nevi of scalp and neck: Secondary | ICD-10-CM | POA: Diagnosis not present

## 2017-10-17 DIAGNOSIS — D2230 Melanocytic nevi of unspecified part of face: Secondary | ICD-10-CM | POA: Diagnosis not present

## 2017-10-17 DIAGNOSIS — Z87898 Personal history of other specified conditions: Secondary | ICD-10-CM | POA: Diagnosis not present

## 2017-10-17 DIAGNOSIS — Z6835 Body mass index (BMI) 35.0-35.9, adult: Secondary | ICD-10-CM

## 2017-10-17 NOTE — Progress Notes (Signed)
Office: [phone]  /  Fax: [phone]   HPI:   Chief Complaint: OBESITY [name] is here to discuss her progress with her obesity treatment plan. She is on the keep a food journal with 1300 calories and 75+ grams of protein daily and is following her eating plan approximately 50 % of the time. She states she is at the gym doing cardio and weights for 30 minutes 2 times per week. [name] has struggled with lack of routine in the past few weeks and has mindlessly ate. She hasn't been journaling secondary to lack of routine and family visiting.  Her weight is 203 lb (92.1 kg) today and has gained 3 pounds since her last visit. She has lost 1 lb since starting treatment with [place].  Vitamin D Deficiency [name] has a diagnosis of vitamin D deficiency. She is currently taking prescription Vit D. She notes fatigue and denies nausea, vomiting or muscle weakness.  Hypertension [name] is a 47 y.o. female with hypertension. [name]'s blood pressure is controlled today and she denies chest pain, chest pressure, or headache. She is working weight loss to help control her blood pressure with the goal of decreasing her risk of heart attack and stroke.   ALLERGIES: Allergies  Allergen Reactions  . Betadine [Povidone Iodine] Other (See Comments)    Dx with allergy testing  . Cymbalta [Duloxetine Hcl] Other (See Comments)    Makes her feel crazy, out of body  . Iodine   . Shellfish Allergy Other (See Comments)    Dx with allergy testing    MEDICATIONS: Current Outpatient Medications on File Prior to Visit  Medication Sig Dispense Refill  . estradiol (VIVELLE-DOT) 0.05 MG/24HR Place 1 patch onto the skin 2 (two) times a week. Monday and Thursday    . hyoscyamine (LEVSIN SL) 0.125 MG SL tablet Place 1 tablet (0.125 mg total) under the tongue every 4 (four) hours as needed. 30 tablet 1  . ibuprofen (ADVIL,MOTRIN) 200 MG tablet Take 200 mg by mouth every 6 (six) hours as needed.    [name] lisinopril  (PRINIVIL,ZESTRIL) 10 MG tablet Take 1 tablet (10 mg total) by mouth 2 (two) times daily. 180 tablet 1  . Multiple Vitamins-Minerals (MULTIVITAMIN ADULT PO) Take by mouth.    . Vitamin D, Ergocalciferol, (DRISDOL) 50000 units CAPS capsule Take 1 capsule (50,000 Units total) by mouth every 7 (seven) days. 4 capsule 0   No current facility-administered medications on file prior to visit.     PAST MEDICAL HISTORY: Past Medical History:  Diagnosis Date  . Acute mastitis of left breast 10/04/2007  . AMA (advanced maternal age) multigravida 36+   . Cancer ([place]) 2002   Left leg  . Celiac disease   . Chronic back pain    H/O  . Complication of anesthesia    Difficult time waking up  . Constipation in pregnancy 03/2006  . Endometriosis    h/o  . Environmental allergies   . Fibroid    During first pregnancy  . Fibromyalgia   . First trimester bleeding 2008  . GBS carrier   . GERD (gastroesophageal reflux disease)   . Granuloma annulare   . H/O amenorrhea 10/2006  . H/O blood clots    7 yr ago in arm  . H/O varicella    As an infant.  . Headache(784.0)   . Hearing loss 08/15/2016  . Heart murmur   . HTN (hypertension)   . Hyperlipidemia 03/02/2017  . IBS (irritable bowel  syndrome) 08/15/2016  . Irregular uterine bleeding   . Joint pain   . Lactose intolerance   . Leg edema   . Leg pain, left 02/09/2016  . Monilial vaginitis 07/27/04  . Palpitations   . Rh negative status during pregnancy   . SAB (spontaneous abortion)    h/o x 2  . Seasonal allergies   . Sleep apnea 08/15/2016  . TMJ syndrome   . Vegetarianism 08/15/2016  . Vitamin D deficiency 08/16/2015    PAST SURGICAL HISTORY: Past Surgical History:  Procedure Laterality Date  . ABDOMINAL HYSTERECTOMY  2013  . APPENDECTOMY  2002  . DILATION AND EVACUATION    . endocervical polyp removal  2001  . ENDOMETRIAL ABLATION  2010  . HERNIA REPAIR  05/16/12   incisional hernia repair  . HYSTEROSCOPY W/ ENDOMETRIAL ABLATION   01/28/2009  . INSERTION OF MESH N/A 05/16/2012   Procedure: INSERTION OF MESH;  Surgeon: Joyice Faster. Cornett, MD;  Location: [facility];  Service: General;  Laterality: N/A;  . KNEE ARTHROSCOPY Left 12/05/2012   Procedure: LEFT KNEE ARTHROSCOPY;  Surgeon: Kerin Salen, MD;  Location: [facility];  Service: Orthopedics;  Laterality: Left;  [name] MELANOMA EXCISION  2002   Left Leg  . melanoma removal   2003  . SALPINGOOPHORECTOMY  12/08/2011   Procedure: SALPINGO OOPHERECTOMY;  Surgeon: Eldred Manges, MD;  Location: [facility];  Service: Gynecology;  Laterality: Bilateral;  . TENDON RELEASE  2004  . uterine curettage  01/2009  . VENTRAL HERNIA REPAIR N/A 05/16/2012   Procedure: LAPAROSCOPIC VENTRAL HERNIA;  Surgeon: Joyice Faster. Cornett, MD;  Location: [facility];  Service: General;  Laterality: N/A;  . WISDOM TOOTH EXTRACTION  1989    SOCIAL HISTORY: Social History   Tobacco Use  . Smoking status: Former Smoker    Types: Cigarettes    Last attempt to quit: 02/29/1988    Years since quitting: 29.6  . Smokeless tobacco: Never Used  Substance Use Topics  . Alcohol use: No  . Drug use: No    FAMILY HISTORY: Family History  Problem Relation Age of Onset  . Hypertension Mother   . Cancer Mother        breast  . Heart disease Mother   . Anxiety disorder Mother   . Heart disease Father        atrial fib  . Hypertension Father   . Cancer Father        Lyphoma,Bladder  . Atrial fibrillation Father   . Obesity Father   . Cancer Maternal Grandmother        breast  . Heart disease Maternal Grandfather        MI  . Heart disease Paternal Grandfather        MI  . Cancer Maternal Aunt        ovarian  . Diverticulitis Brother     ROS: Review of Systems  Constitutional: Positive for malaise/fatigue. Negative for weight loss.  Cardiovascular: Negative for chest pain.       Negative chest pressure  Gastrointestinal: Negative for nausea and vomiting.  Musculoskeletal:       Negative  muscle weakness  Neurological: Negative for headaches.    PHYSICAL EXAM: Blood pressure 121/83, pulse 67, temperature 98.2 F (36.8 C), temperature source Oral, height 5\' 4"  (1.626 m), weight 203 lb (92.1 kg), last menstrual period 02/22/2011, SpO2 99 %. Body mass index is 34.84 kg/m. Physical Exam  Constitutional: She is oriented to  person, place, and time. She appears well-developed and well-nourished.  Cardiovascular: Normal rate.  Pulmonary/Chest: Effort normal.  Musculoskeletal: Normal range of motion.  Neurological: She is oriented to person, place, and time.  Skin: Skin is warm and dry.  Psychiatric: She has a normal mood and affect. Her behavior is normal.  Vitals reviewed.   RECENT LABS AND TESTS: BMET    Component Value Date/Time   NA 141 06/20/2017 1155   K 4.5 06/20/2017 1155   CL 99 06/20/2017 1155   CO2 26 06/20/2017 1155   GLUCOSE 91 06/20/2017 1155   GLUCOSE 105 (H) 03/02/2017 0844   BUN 9 06/20/2017 1155   CREATININE 0.56 (L) 06/20/2017 1155   CALCIUM 10.0 06/20/2017 1155   GFRNONAA 111 06/20/2017 1155   GFRAA 128 06/20/2017 1155   Lab Results  Component Value Date   HGBA1C 5.2 06/20/2017   Lab Results  Component Value Date   INSULIN 10.7 06/20/2017   CBC    Component Value Date/Time   WBC 8.7 06/20/2017 1155   WBC 7.0 03/02/2017 0844   RBC 4.77 06/20/2017 1155   RBC 5.10 03/02/2017 0844   HGB 14.2 06/20/2017 1155   HCT 41.8 06/20/2017 1155   PLT 364.0 03/02/2017 0844   MCV 88 06/20/2017 1155   MCH 29.8 06/20/2017 1155   MCH 29.0 05/17/2012 0640   MCHC 34.0 06/20/2017 1155   MCHC 33.4 03/02/2017 0844   RDW 13.7 06/20/2017 1155   LYMPHSABS 2.5 06/20/2017 1155   MONOABS 0.5 02/02/2016 0926   EOSABS 0.4 06/20/2017 1155   BASOSABS 0.0 06/20/2017 1155   Iron/TIBC/Ferritin/ %Sat No results found for: IRON, TIBC, FERRITIN, IRONPCTSAT Lipid Panel     Component Value Date/Time   CHOL 159 06/20/2017 1155   TRIG 100 06/20/2017 1155   HDL  47 06/20/2017 1155   CHOLHDL 4 03/02/2017 0844   VLDL 21.2 03/02/2017 0844   LDLCALC 92 06/20/2017 1155   Hepatic Function Panel     Component Value Date/Time   PROT 7.0 06/20/2017 1155   ALBUMIN 4.7 06/20/2017 1155   AST 15 06/20/2017 1155   ALT 18 06/20/2017 1155   ALKPHOS 73 06/20/2017 1155   BILITOT 0.5 06/20/2017 1155   BILIDIR 0.1 01/28/2015 0946      Component Value Date/Time   TSH 1.050 06/20/2017 1155   TSH 1.79 03/02/2017 0844   TSH 1.05 08/15/2016 1036  Results for AERIE, DONICA (MRN [id number]) as of 10/17/2017 10:30  Ref. Range 06/20/2017 11:55  Vitamin D, 25-Hydroxy Latest Ref Range: 30.0 - 100.0 ng/mL 28.2 (L)    ASSESSMENT AND PLAN: Vitamin D deficiency  Essential hypertension  Class 2 severe obesity with serious comorbidity and body mass index (BMI) of 35.0 to 35.9 in adult, unspecified obesity type ([place])  PLAN:  Vitamin D Deficiency [name] was informed that low vitamin D levels contributes to fatigue and are associated with obesity, breast, and colon cancer. [name] agrees to continue taking prescription Vit D @50 ,000 IU every week and will follow up for routine testing of vitamin D, at least 2-3 times per year. She was informed of the risk of over-replacement of vitamin D and agrees to not increase her dose unless she discusses this with [place] first. [name] agrees to follow up with our clinic in 2 weeks.  Hypertension We discussed sodium restriction, working on healthy weight loss, and a regular exercise program as the means to achieve improved blood pressure control. [name] agreed with this plan and agreed to  follow up as directed. We will continue to monitor her blood pressure as well as her progress with the above lifestyle modifications. She will continue taking lisinopril and will watch for signs of hypotension as she continues her lifestyle modifications. [name] agrees to follow up with our clinic in 2 weeks.  We spent > than 50% of the 15 minute visit on the  counseling as documented in the note.  Obesity [name] is currently in the action stage of change. As such, her goal is to continue with weight loss efforts She has agreed to keep a food journal with 1300 calories and 75+ grams of protein daily [name] has been instructed to work up to a goal of 150 minutes of combined cardio and strengthening exercise per week for weight loss and overall health benefits. We discussed the following Behavioral Modification Strategies today: increasing vegetables, work on meal planning and easy cooking plans, better snacking choices, and keep a strict food journal   [name] has agreed to follow up with our clinic in 2 weeks. She was informed of the importance of frequent follow up visits to maximize her success with intensive lifestyle modifications for her multiple health conditions.   OBESITY BEHAVIORAL INTERVENTION VISIT  Today's visit was # 7 out of 22.  Starting weight: 204 lbs Starting date: 06/20/17 Today's weight : 203 lbs  Today's date: 10/17/2017 Total lbs lost to date: 1    ASK: We discussed the diagnosis of obesity with [name] today and [name] agreed to give [place] permission to discuss obesity behavioral modification therapy today.  ASSESS: [name] has the diagnosis of obesity and her BMI today is 34.83 [name] is in the action stage of change   ADVISE: [name] was educated on the multiple health risks of obesity as well as the benefit of weight loss to improve her health. She was advised of the need for long term treatment and the importance of lifestyle modifications.  AGREE: Multiple dietary modification options and treatment options were discussed and  [name] agreed to the above obesity treatment plan.  I, Trixie Dredge, am acting as transcriptionist for Ilene Qua, MD  I have reviewed the above documentation for accuracy and completeness, and I agree with the above. - Ilene Qua, MD

## 2017-10-23 DIAGNOSIS — J3089 Other allergic rhinitis: Secondary | ICD-10-CM | POA: Diagnosis not present

## 2017-10-23 DIAGNOSIS — J301 Allergic rhinitis due to pollen: Secondary | ICD-10-CM | POA: Diagnosis not present

## 2017-10-23 DIAGNOSIS — J3081 Allergic rhinitis due to animal (cat) (dog) hair and dander: Secondary | ICD-10-CM | POA: Diagnosis not present

## 2017-10-31 ENCOUNTER — Ambulatory Visit (INDEPENDENT_AMBULATORY_CARE_PROVIDER_SITE_OTHER): Payer: 59 | Admitting: Family Medicine

## 2017-10-31 VITALS — BP 111/74 | HR 59 | Temp 97.9°F | Ht 64.0 in | Wt 198.0 lb

## 2017-10-31 DIAGNOSIS — I10 Essential (primary) hypertension: Secondary | ICD-10-CM

## 2017-10-31 DIAGNOSIS — Z6834 Body mass index (BMI) 34.0-34.9, adult: Secondary | ICD-10-CM

## 2017-10-31 DIAGNOSIS — E559 Vitamin D deficiency, unspecified: Secondary | ICD-10-CM | POA: Diagnosis not present

## 2017-10-31 DIAGNOSIS — E669 Obesity, unspecified: Secondary | ICD-10-CM

## 2017-10-31 NOTE — Progress Notes (Signed)
Office: [phone]  /  Fax: [phone]   HPI:   Chief Complaint: OBESITY [name] is here to discuss her progress with her obesity treatment plan. She is on the keep a food journal with 1300 calories and 75+ grams of protein daily and is following her eating plan approximately 50 % of the time. She states she is exercising with a personal trainer for 30 minutes 2 times per week. [name] hasn't been tracking her protein or calories but has transitioned to a meal plan and meal service where she gets recipes on the weekend; then prepares everything from scratch. She is not sure of protein or calories of meals.  Her weight is 198 lb (89.8 kg) today and has had a weight loss of 5 pounds over a period of 2 weeks since her last visit. She has lost 6 lbs since starting treatment with [place].  Hypertension [name] is a 47 y.o. female with hypertension. [name]'s blood pressure is controlled today. She denies chest pain, chest pressure, or headache. She is working weight loss to help control her blood pressure with the goal of decreasing her risk of heart attack and stroke.   Vitamin D Deficiency [name] has a diagnosis of vitamin D deficiency. She is currently taking prescription Vit D. She notes improving fatigue and denies nausea, vomiting or muscle weakness.  ALLERGIES: Allergies  Allergen Reactions  . Betadine [Povidone Iodine] Other (See Comments)    Dx with allergy testing  . Cymbalta [Duloxetine Hcl] Other (See Comments)    Makes her feel crazy, out of body  . Iodine   . Shellfish Allergy Other (See Comments)    Dx with allergy testing    MEDICATIONS: Current Outpatient Medications on File Prior to Visit  Medication Sig Dispense Refill  . estradiol (VIVELLE-DOT) 0.05 MG/24HR Place 1 patch onto the skin 2 (two) times a week. Monday and Thursday    . hyoscyamine (LEVSIN SL) 0.125 MG SL tablet Place 1 tablet (0.125 mg total) under the tongue every 4 (four) hours as needed. 30 tablet 1    . ibuprofen (ADVIL,MOTRIN) 200 MG tablet Take 200 mg by mouth every 6 (six) hours as needed.    [name] lisinopril (PRINIVIL,ZESTRIL) 10 MG tablet Take 1 tablet (10 mg total) by mouth 2 (two) times daily. (Patient taking differently: Take 10 mg by mouth daily. ) 180 tablet 1  . Multiple Vitamins-Minerals (MULTIVITAMIN ADULT PO) Take by mouth.    . Vitamin D, Ergocalciferol, (DRISDOL) 50000 units CAPS capsule Take 1 capsule (50,000 Units total) by mouth every 7 (seven) days. 4 capsule 0   No current facility-administered medications on file prior to visit.     PAST MEDICAL HISTORY: Past Medical History:  Diagnosis Date  . Acute mastitis of left breast 10/04/2007  . AMA (advanced maternal age) multigravida 29+   . Cancer ([place]) 2002   Left leg  . Celiac disease   . Chronic back pain    H/O  . Complication of anesthesia    Difficult time waking up  . Constipation in pregnancy 03/2006  . Endometriosis    h/o  . Environmental allergies   . Fibroid    During first pregnancy  . Fibromyalgia   . First trimester bleeding 2008  . GBS carrier   . GERD (gastroesophageal reflux disease)   . Granuloma annulare   . H/O amenorrhea 10/2006  . H/O blood clots    7 yr ago in arm  . H/O varicella  As an infant.  . Headache(784.0)   . Hearing loss 08/15/2016  . Heart murmur   . HTN (hypertension)   . Hyperlipidemia 03/02/2017  . IBS (irritable bowel syndrome) 08/15/2016  . Irregular uterine bleeding   . Joint pain   . Lactose intolerance   . Leg edema   . Leg pain, left 02/09/2016  . Monilial vaginitis 07/27/04  . Palpitations   . Rh negative status during pregnancy   . SAB (spontaneous abortion)    h/o x 2  . Seasonal allergies   . Sleep apnea 08/15/2016  . TMJ syndrome   . Vegetarianism 08/15/2016  . Vitamin D deficiency 08/16/2015    PAST SURGICAL HISTORY: Past Surgical History:  Procedure Laterality Date  . ABDOMINAL HYSTERECTOMY  2013  . APPENDECTOMY  2002  . DILATION AND EVACUATION     . endocervical polyp removal  2001  . ENDOMETRIAL ABLATION  2010  . HERNIA REPAIR  05/16/12   incisional hernia repair  . HYSTEROSCOPY W/ ENDOMETRIAL ABLATION  01/28/2009  . INSERTION OF MESH N/A 05/16/2012   Procedure: INSERTION OF MESH;  Surgeon: Joyice Faster. Cornett, MD;  Location: [facility];  Service: General;  Laterality: N/A;  . KNEE ARTHROSCOPY Left 12/05/2012   Procedure: LEFT KNEE ARTHROSCOPY;  Surgeon: Kerin Salen, MD;  Location: [facility];  Service: Orthopedics;  Laterality: Left;  [name] MELANOMA EXCISION  2002   Left Leg  . melanoma removal   2003  . SALPINGOOPHORECTOMY  12/08/2011   Procedure: SALPINGO OOPHERECTOMY;  Surgeon: Eldred Manges, MD;  Location: [facility];  Service: Gynecology;  Laterality: Bilateral;  . TENDON RELEASE  2004  . uterine curettage  01/2009  . VENTRAL HERNIA REPAIR N/A 05/16/2012   Procedure: LAPAROSCOPIC VENTRAL HERNIA;  Surgeon: Joyice Faster. Cornett, MD;  Location: [facility];  Service: General;  Laterality: N/A;  . WISDOM TOOTH EXTRACTION  1989    SOCIAL HISTORY: Social History   Tobacco Use  . Smoking status: Former Smoker    Types: Cigarettes    Last attempt to quit: 02/29/1988    Years since quitting: 29.6  . Smokeless tobacco: Never Used  Substance Use Topics  . Alcohol use: No  . Drug use: No    FAMILY HISTORY: Family History  Problem Relation Age of Onset  . Hypertension Mother   . Cancer Mother        breast  . Heart disease Mother   . Anxiety disorder Mother   . Heart disease Father        atrial fib  . Hypertension Father   . Cancer Father        Lyphoma,Bladder  . Atrial fibrillation Father   . Obesity Father   . Cancer Maternal Grandmother        breast  . Heart disease Maternal Grandfather        MI  . Heart disease Paternal Grandfather        MI  . Cancer Maternal Aunt        ovarian  . Diverticulitis Brother     ROS: Review of Systems  Constitutional: Positive for malaise/fatigue and weight loss.   Cardiovascular: Negative for chest pain.       Negative chest pressure  Gastrointestinal: Negative for nausea and vomiting.  Musculoskeletal:       Negative muscle weakness  Neurological: Negative for headaches.    PHYSICAL EXAM: Blood pressure 111/74, pulse (!) 59, temperature 97.9 F (36.6 C), temperature source Oral,  height 5\' 4"  (1.626 m), weight 198 lb (89.8 kg), last menstrual period 02/22/2011, SpO2 99 %. Body mass index is 33.99 kg/m. Physical Exam  Constitutional: She is oriented to person, place, and time. She appears well-developed and well-nourished.  Cardiovascular: Normal rate.  Pulmonary/Chest: Effort normal.  Musculoskeletal: Normal range of motion.  Neurological: She is oriented to person, place, and time.  Skin: Skin is warm and dry.  Psychiatric: She has a normal mood and affect. Her behavior is normal.  Vitals reviewed.   RECENT LABS AND TESTS: BMET    Component Value Date/Time   NA 141 06/20/2017 1155   K 4.5 06/20/2017 1155   CL 99 06/20/2017 1155   CO2 26 06/20/2017 1155   GLUCOSE 91 06/20/2017 1155   GLUCOSE 105 (H) 03/02/2017 0844   BUN 9 06/20/2017 1155   CREATININE 0.56 (L) 06/20/2017 1155   CALCIUM 10.0 06/20/2017 1155   GFRNONAA 111 06/20/2017 1155   GFRAA 128 06/20/2017 1155   Lab Results  Component Value Date   HGBA1C 5.2 06/20/2017   Lab Results  Component Value Date   INSULIN 10.7 06/20/2017   CBC    Component Value Date/Time   WBC 8.7 06/20/2017 1155   WBC 7.0 03/02/2017 0844   RBC 4.77 06/20/2017 1155   RBC 5.10 03/02/2017 0844   HGB 14.2 06/20/2017 1155   HCT 41.8 06/20/2017 1155   PLT 364.0 03/02/2017 0844   MCV 88 06/20/2017 1155   MCH 29.8 06/20/2017 1155   MCH 29.0 05/17/2012 0640   MCHC 34.0 06/20/2017 1155   MCHC 33.4 03/02/2017 0844   RDW 13.7 06/20/2017 1155   LYMPHSABS 2.5 06/20/2017 1155   MONOABS 0.5 02/02/2016 0926   EOSABS 0.4 06/20/2017 1155   BASOSABS 0.0 06/20/2017 1155   Iron/TIBC/Ferritin/  %Sat No results found for: IRON, TIBC, FERRITIN, IRONPCTSAT Lipid Panel     Component Value Date/Time   CHOL 159 06/20/2017 1155   TRIG 100 06/20/2017 1155   HDL 47 06/20/2017 1155   CHOLHDL 4 03/02/2017 0844   VLDL 21.2 03/02/2017 0844   LDLCALC 92 06/20/2017 1155   Hepatic Function Panel     Component Value Date/Time   PROT 7.0 06/20/2017 1155   ALBUMIN 4.7 06/20/2017 1155   AST 15 06/20/2017 1155   ALT 18 06/20/2017 1155   ALKPHOS 73 06/20/2017 1155   BILITOT 0.5 06/20/2017 1155   BILIDIR 0.1 01/28/2015 0946      Component Value Date/Time   TSH 1.050 06/20/2017 1155   TSH 1.79 03/02/2017 0844   TSH 1.05 08/15/2016 1036  Results for ARIYONNA, TWICHELL (MRN [id number]) as of 10/31/2017 13:27  Ref. Range 06/20/2017 11:55  Vitamin D, 25-Hydroxy Latest Ref Range: 30.0 - 100.0 ng/mL 28.2 (L)    ASSESSMENT AND PLAN: Essential hypertension  Vitamin D deficiency  Class 1 obesity with serious comorbidity and body mass index (BMI) of 34.0 to 34.9 in adult, unspecified obesity type  PLAN:  Hypertension We discussed sodium restriction, working on healthy weight loss, and a regular exercise program as the means to achieve improved blood pressure control. [name] agreed with this plan and agreed to follow up as directed. We will continue to monitor her blood pressure as well as her progress with the above lifestyle modifications. [name] agrees to decrease lisinopril to 10 mg PO daily, no refill needed. She will watch for signs of hypotension as she continues her lifestyle modifications. [name] agrees to follow up with our clinic in 2 to  3 weeks.  Vitamin D Deficiency [name] was informed that low vitamin D levels contributes to fatigue and are associated with obesity, breast, and colon cancer. [name] agrees to continue taking prescription Vit D @50 ,000 IU every week, no refill needed. She will follow up for routine testing of vitamin D, at least 2-3 times per year. She was informed of the risk  of over-replacement of vitamin D and agrees to not increase her dose unless she discusses this with [place] first. [name] agrees to follow up with our clinic in 2 to 3 weeks.  I spent > than 50% of the 15 minute visit on counseling as documented in the note.  Obesity [name] is currently in the action stage of change. As such, her goal is to continue with weight loss efforts She has agreed to keep a food journal with 1300 calories and 75+ grams of protein daily [name] has been instructed to work up to a goal of 150 minutes of combined cardio and strengthening exercise per week for weight loss and overall health benefits. We discussed the following Behavioral Modification Strategies today: increasing vegetables, work on meal planning and easy cooking plans, better snacking choices, and planning for success [name] is to [employer] some recipes to figure out protein and calorie of meals.  [name] has agreed to follow up with our clinic in 2 to 3 weeks. She was informed of the importance of frequent follow up visits to maximize her success with intensive lifestyle modifications for her multiple health conditions.   OBESITY BEHAVIORAL INTERVENTION VISIT  Today's visit was # 8   Starting weight: 204 lbs Starting date: 06/20/16 Today's weight : 198 lbs  Today's date: 10/31/2017 Total lbs lost to date: 6 At least 15 minutes were spent on discussing the following behavioral intervention visit.   ASK: We discussed the diagnosis of obesity with [name] today and [name] agreed to give [place] permission to discuss obesity behavioral modification therapy today.  ASSESS: [name] has the diagnosis of obesity and her BMI today is 33.97 Bryanna is in the action stage of change   ADVISE: [name] was educated on the multiple health risks of obesity as well as the benefit of weight loss to improve her health. She was advised of the need for long term treatment and the importance of lifestyle modifications to improve her  current health and to decrease her risk of future health problems.  AGREE: Multiple dietary modification options and treatment options were discussed and  Velna agreed to follow the recommendations documented in the above note.  ARRANGE: Pieper was educated on the importance of frequent visits to treat obesity as outlined per CMS and USPSTF guidelines and agreed to schedule her next follow up appointment today.  I, Trixie Dredge, am acting as transcriptionist for Ilene Qua, MD  I have reviewed the above documentation for accuracy and completeness, and I agree with the above. - Ilene Qua, MD

## 2017-11-01 ENCOUNTER — Other Ambulatory Visit: Payer: Self-pay | Admitting: Family Medicine

## 2017-11-20 ENCOUNTER — Ambulatory Visit (INDEPENDENT_AMBULATORY_CARE_PROVIDER_SITE_OTHER): Payer: 59 | Admitting: Family Medicine

## 2017-11-20 VITALS — BP 116/72 | HR 72 | Temp 98.0°F | Ht 64.0 in | Wt 196.0 lb

## 2017-11-20 DIAGNOSIS — R002 Palpitations: Secondary | ICD-10-CM | POA: Diagnosis not present

## 2017-11-20 DIAGNOSIS — Z9189 Other specified personal risk factors, not elsewhere classified: Secondary | ICD-10-CM

## 2017-11-20 DIAGNOSIS — E669 Obesity, unspecified: Secondary | ICD-10-CM

## 2017-11-20 DIAGNOSIS — Z6833 Body mass index (BMI) 33.0-33.9, adult: Secondary | ICD-10-CM

## 2017-11-20 DIAGNOSIS — I10 Essential (primary) hypertension: Secondary | ICD-10-CM | POA: Diagnosis not present

## 2017-11-20 DIAGNOSIS — E559 Vitamin D deficiency, unspecified: Secondary | ICD-10-CM

## 2017-11-20 MED ORDER — VITAMIN D (ERGOCALCIFEROL) 1.25 MG (50000 UNIT) PO CAPS: 50000.0000 [IU] | ORAL_CAPSULE | ORAL | 0 refills | Status: DC

## 2017-11-20 NOTE — Progress Notes (Signed)
Office: [phone]  /  Fax: [phone]   HPI:   Chief Complaint: OBESITY [name] is here to discuss her progress with her obesity treatment plan. She is keeping a food journal with 1300 calories and 75+ grams of protein and is following her eating plan approximately 50 % of the time. She states that she is doing cardio 30 minutes 2 times per week. Jimi feels that she does really well following the vegan plant based plan that she found online. She then made a recipe and had an overindulgence of 4 pumpkin chocolate chip muffins.  Her weight is 196 lb (88.9 kg) today and has had a weight loss of 2 pounds over a period of 3 weeks since her last visit. She has lost 8 lbs since starting treatment with [place].  Vitamin D deficiency [name] has a diagnosis of vitamin D deficiency. She is currently taking vit D. She admits fatigue and denies nausea, vomiting or muscle weakness.  At risk for osteopenia and osteoporosis [name] is at higher risk of osteopenia and osteoporosis due to vitamin D deficiency.   Hypertension [name] is a 47 y.o. female with hypertension. [name] denies dizziness and lightheadedness. She is working on weight loss to help control her blood pressure with the goal of decreasing her risk of heart attack and stroke. [name]'s blood pressure is currently controlled.  Palpitations [name] is occasionally experiencing palpitation symptoms right now. She has not had symptoms in the past few days and does have a history of occasional symptoms for years.  ALLERGIES: Allergies  Allergen Reactions  . Betadine [Povidone Iodine] Other (See Comments)    Dx with allergy testing  . Cymbalta [Duloxetine Hcl] Other (See Comments)    Makes her feel crazy, out of body  . Iodine   . Shellfish Allergy Other (See Comments)    Dx with allergy testing    MEDICATIONS: Current Outpatient Medications on File Prior to Visit  Medication Sig Dispense Refill  . estradiol (VIVELLE-DOT)  0.05 MG/24HR Place 1 patch onto the skin 2 (two) times a week. Monday and Thursday    . hyoscyamine (LEVSIN SL) 0.125 MG SL tablet Place 1 tablet (0.125 mg total) under the tongue every 4 (four) hours as needed. 30 tablet 1  . ibuprofen (ADVIL,MOTRIN) 200 MG tablet Take 200 mg by mouth every 6 (six) hours as needed.    [name] [name] (PRINIVIL,ZESTRIL) 10 MG tablet Take 1 tablet (10 mg total) by mouth daily. 90 tablet 1  . Multiple Vitamins-Minerals (MULTIVITAMIN ADULT PO) Take by mouth.     No current facility-administered medications on file prior to visit.     PAST MEDICAL HISTORY: Past Medical History:  Diagnosis Date  . Acute mastitis of left breast 10/04/2007  . AMA (advanced maternal age) multigravida 85+   . Cancer ([place]) 2002   Left leg  . Celiac disease   . Chronic back pain    H/O  . Complication of anesthesia    Difficult time waking up  . Constipation in pregnancy 03/2006  . Endometriosis    h/o  . Environmental allergies   . Fibroid    During first pregnancy  . Fibromyalgia   . First trimester bleeding 2008  . GBS carrier   . GERD (gastroesophageal reflux disease)   . Granuloma annulare   . H/O amenorrhea 10/2006  . H/O blood clots    7 yr ago in arm  . H/O varicella    As an infant.  [name]  Headache(784.0)   . Hearing loss 08/15/2016  . Heart murmur   . HTN (hypertension)   . Hyperlipidemia 03/02/2017  . IBS (irritable bowel syndrome) 08/15/2016  . Irregular uterine bleeding   . Joint pain   . Lactose intolerance   . Leg edema   . Leg pain, left 02/09/2016  . Monilial vaginitis 07/27/04  . Palpitations   . Rh negative status during pregnancy   . SAB (spontaneous abortion)    h/o x 2  . Seasonal allergies   . Sleep apnea 08/15/2016  . TMJ syndrome   . Vegetarianism 08/15/2016  . Vitamin D deficiency 08/16/2015    PAST SURGICAL HISTORY: Past Surgical History:  Procedure Laterality Date  . ABDOMINAL HYSTERECTOMY  2013  . APPENDECTOMY  2002  . DILATION AND  EVACUATION    . endocervical polyp removal  2001  . ENDOMETRIAL ABLATION  2010  . HERNIA REPAIR  05/16/12   incisional hernia repair  . HYSTEROSCOPY W/ ENDOMETRIAL ABLATION  01/28/2009  . INSERTION OF MESH N/A 05/16/2012   Procedure: INSERTION OF MESH;  Surgeon: Joyice Faster. Cornett, MD;  Location: [name];  Service: General;  Laterality: N/A;  . KNEE ARTHROSCOPY Left 12/05/2012   Procedure: LEFT KNEE ARTHROSCOPY;  Surgeon: Kerin Salen, MD;  Location: [facility];  Service: Orthopedics;  Laterality: Left;  [name] MELANOMA EXCISION  2002   Left Leg  . melanoma removal   2003  . SALPINGOOPHORECTOMY  12/08/2011   Procedure: SALPINGO OOPHERECTOMY;  Surgeon: Eldred Manges, MD;  Location: [facility];  Service: Gynecology;  Laterality: Bilateral;  . TENDON RELEASE  2004  . uterine curettage  01/2009  . VENTRAL HERNIA REPAIR N/A 05/16/2012   Procedure: LAPAROSCOPIC VENTRAL HERNIA;  Surgeon: Joyice Faster. Cornett, MD;  Location: [facility];  Service: General;  Laterality: N/A;  . WISDOM TOOTH EXTRACTION  1989    SOCIAL HISTORY: Social History   Tobacco Use  . Smoking status: Former Smoker    Types: Cigarettes    Last attempt to quit: 02/29/1988    Years since quitting: 29.7  . Smokeless tobacco: Never Used  Substance Use Topics  . Alcohol use: No  . Drug use: No    FAMILY HISTORY: Family History  Problem Relation Age of Onset  . Hypertension Mother   . Cancer Mother        breast  . Heart disease Mother   . Anxiety disorder Mother   . Heart disease Father        atrial fib  . Hypertension Father   . Cancer Father        Lyphoma,Bladder  . Atrial fibrillation Father   . Obesity Father   . Cancer Maternal Grandmother        breast  . Heart disease Maternal Grandfather        MI  . Heart disease Paternal Grandfather        MI  . Cancer Maternal Aunt        ovarian  . Diverticulitis Brother     ROS: Review of Systems  Constitutional: Positive for malaise/fatigue and weight  loss.  Cardiovascular: Positive for palpitations.  Gastrointestinal: Negative for nausea and vomiting.  Musculoskeletal:       Negative for muscle weakness.  Neurological: Negative for dizziness.       Negative for lightheadedness.    PHYSICAL EXAM: Blood pressure 116/72, pulse 72, temperature 98 F (36.7 C), temperature source Oral, height 5\' 4"  (1.626 m), weight  196 lb (88.9 kg), last menstrual period 02/22/2011, SpO2 96 %. Body mass index is 33.64 kg/m. Physical Exam  Constitutional: She is oriented to person, place, and time. She appears well-developed and well-nourished.  Cardiovascular: Normal rate.  Pulmonary/Chest: Effort normal.  Musculoskeletal: Normal range of motion.  Neurological: She is oriented to person, place, and time.  Skin: Skin is warm and dry.  Psychiatric: She has a normal mood and affect. Her behavior is normal.  Vitals reviewed.   RECENT LABS AND TESTS: BMET    Component Value Date/Time   NA 141 06/20/2017 1155   K 4.5 06/20/2017 1155   CL 99 06/20/2017 1155   CO2 26 06/20/2017 1155   GLUCOSE 91 06/20/2017 1155   GLUCOSE 105 (H) 03/02/2017 0844   BUN 9 06/20/2017 1155   CREATININE 0.56 (L) 06/20/2017 1155   CALCIUM 10.0 06/20/2017 1155   GFRNONAA 111 06/20/2017 1155   GFRAA 128 06/20/2017 1155   Lab Results  Component Value Date   HGBA1C 5.2 06/20/2017   Lab Results  Component Value Date   INSULIN 10.7 06/20/2017   CBC    Component Value Date/Time   WBC 8.7 06/20/2017 1155   WBC 7.0 03/02/2017 0844   RBC 4.77 06/20/2017 1155   RBC 5.10 03/02/2017 0844   HGB 14.2 06/20/2017 1155   HCT 41.8 06/20/2017 1155   PLT 364.0 03/02/2017 0844   MCV 88 06/20/2017 1155   MCH 29.8 06/20/2017 1155   MCH 29.0 05/17/2012 0640   MCHC 34.0 06/20/2017 1155   MCHC 33.4 03/02/2017 0844   RDW 13.7 06/20/2017 1155   LYMPHSABS 2.5 06/20/2017 1155   MONOABS 0.5 02/02/2016 0926   EOSABS 0.4 06/20/2017 1155   BASOSABS 0.0 06/20/2017 1155    Iron/TIBC/Ferritin/ %Sat No results found for: IRON, TIBC, FERRITIN, IRONPCTSAT Lipid Panel     Component Value Date/Time   CHOL 159 06/20/2017 1155   TRIG 100 06/20/2017 1155   HDL 47 06/20/2017 1155   CHOLHDL 4 03/02/2017 0844   VLDL 21.2 03/02/2017 0844   LDLCALC 92 06/20/2017 1155   Hepatic Function Panel     Component Value Date/Time   PROT 7.0 06/20/2017 1155   ALBUMIN 4.7 06/20/2017 1155   AST 15 06/20/2017 1155   ALT 18 06/20/2017 1155   ALKPHOS 73 06/20/2017 1155   BILITOT 0.5 06/20/2017 1155   BILIDIR 0.1 01/28/2015 0946      Component Value Date/Time   TSH 1.050 06/20/2017 1155   TSH 1.79 03/02/2017 0844   TSH 1.05 08/15/2016 1036   Results for JAYDALYN, DEMATTIA (MRN [id number]) as of 11/20/2017 17:32  Ref. Range 06/20/2017 11:55  Vitamin D, 25-Hydroxy Latest Ref Range: 30.0 - 100.0 ng/mL 28.2 (L)   ASSESSMENT AND PLAN: Vitamin D deficiency - Plan: VITAMIN D 25 Hydroxy (Vit-D Deficiency, Fractures), Vitamin D, Ergocalciferol, (DRISDOL) 50000 units CAPS capsule  Essential hypertension - Plan: Comprehensive metabolic panel, Lipid Panel With LDL/HDL Ratio  Palpitations - Plan: Comprehensive metabolic panel, T3, T4, free, TSH  At risk for osteoporosis  Class 1 obesity with serious comorbidity and body mass index (BMI) of 33.0 to 33.9 in adult, unspecified obesity type  PLAN:  Vitamin D Deficiency [name] was informed that low vitamin D levels contributes to fatigue and are associated with obesity, breast, and colon cancer. She agrees to continue to take prescription Vit D @50 ,000 IU every week #4 with no refills and will follow up for routine testing of vitamin D, at least 2-3 times  per year. She was informed of the risk of over-replacement of vitamin D and agrees to not increase her dose unless she discusses this with [place] first. [name] agrees to follow up in 2 weeks.  At risk for osteopenia and osteoporosis [name] was given extended (15 minutes) osteoporosis  prevention counseling today. [name] is at risk for osteopenia and osteoporosis due to her vitamin D deficiency. She was encouraged to take her vitamin D and follow her higher calcium diet and increase strengthening exercise to help strengthen her bones and decrease her risk of osteopenia and osteoporosis.  Hypertension We discussed sodium restriction, working on healthy weight loss, and a regular exercise program as the means to achieve improved blood pressure control. [name] agreed with this plan and agreed to follow up as directed. We will continue to monitor her blood pressure as well as her progress with the above lifestyle modifications. She agrees to continue to take [name], but will decrease to 5mg  PO daily and will watch for signs of hypotension as she continues her lifestyle modifications. We will draw a FLP and CMP today. Tamaiya agrees to follow up in 2 weeks.  Palpitations [name] will discuss her symptoms with [name]. We will draw a TSH and CMP today. She will follow up as directed.  Obesity [name] is currently in the action stage of change. As such, her goal is to continue with weight loss efforts. She has agreed to [employer] based plan. Damyia has been instructed to work up to a goal of 150 minutes of combined cardio and strengthening exercise per week for weight loss and overall health benefits. We discussed the following Behavioral Modification Strategies today: increasing lean protein intake, increasing vegetables, work on meal planning and easy cooking plans, and planning for success.  [name] has agreed to follow up with our clinic in 2 weeks. She was informed of the importance of frequent follow up visits to maximize her success with intensive lifestyle modifications for her multiple health conditions.   OBESITY BEHAVIORAL INTERVENTION VISIT  Today's visit was # 9   Starting weight: 204 lbs Starting date: 06/20/17 Today's weight : Weight: 196 lb (88.9 kg)  Today's date:  11/20/2017 Total lbs lost to date: 8  ASK: We discussed the diagnosis of obesity with [name] today and [name] agreed to give [place] permission to discuss obesity behavioral modification therapy today.  ASSESS: [name] has the diagnosis of obesity and her BMI today is 33.63. Andelyn is in the action stage of change.   ADVISE: Tami was educated on the multiple health risks of obesity as well as the benefit of weight loss to improve her health. She was advised of the need for long term treatment and the importance of lifestyle modifications to improve her current health and to decrease her risk of future health problems.  AGREE: Multiple dietary modification options and treatment options were discussed and [name] agreed to follow the recommendations documented in the above note.  ARRANGE: Arrayah was educated on the importance of frequent visits to treat obesity as outlined per CMS and USPSTF guidelines and agreed to schedule her next follow up appointment today.  I, Marcille Blanco, am acting as Location manager for Eber Jones, MD  I have reviewed the above documentation for accuracy and completeness, and I agree with the above. - Ilene Qua, MD

## 2017-11-21 LAB — COMPREHENSIVE METABOLIC PANEL
A/G RATIO: 2.3 — AB (ref 1.2–2.2)
ALBUMIN: 4.6 g/dL (ref 3.5–5.5)
ALK PHOS: 67 IU/L (ref 39–117)
ALT: 19 IU/L (ref 0–32)
AST: 17 IU/L (ref 0–40)
BILIRUBIN TOTAL: 0.4 mg/dL (ref 0.0–1.2)
BUN / CREAT RATIO: 20 (ref 9–23)
BUN: 11 mg/dL (ref 6–24)
CHLORIDE: 99 mmol/L (ref 96–106)
CO2: 26 mmol/L (ref 20–29)
Calcium: 10 mg/dL (ref 8.7–10.2)
Creatinine, Ser: 0.56 mg/dL — ABNORMAL LOW (ref 0.57–1.00)
GFR calc Af Amer: 128 mL/min/{1.73_m2} (ref >59)
GFR calc non Af Amer: 111 mL/min/{1.73_m2} (ref >59)
GLUCOSE: 100 mg/dL — AB (ref 65–99)
Globulin, Total: 2 g/dL (ref 1.5–4.5)
Potassium: 3.5 mmol/L (ref 3.5–5.2)
Sodium: 142 mmol/L (ref 134–144)
Total Protein: 6.6 g/dL (ref 6.0–8.5)

## 2017-11-21 LAB — T3: T3 TOTAL: 155 ng/dL (ref 71–180)

## 2017-11-21 LAB — LIPID PANEL WITH LDL/HDL RATIO
CHOLESTEROL TOTAL: 143 mg/dL (ref 100–199)
HDL: 42 mg/dL (ref >39)
LDL Calculated: 83 mg/dL (ref 0–99)
LDl/HDL Ratio: 2 ratio (ref 0.0–3.2)
Triglycerides: 92 mg/dL (ref 0–149)
VLDL CHOLESTEROL CAL: 18 mg/dL (ref 5–40)

## 2017-11-21 LAB — VITAMIN D 25 HYDROXY (VIT D DEFICIENCY, FRACTURES): VIT D 25 HYDROXY: 37.1 ng/mL (ref 30.0–100.0)

## 2017-11-21 LAB — T4, FREE: Free T4: 1.23 ng/dL (ref 0.82–1.77)

## 2017-11-21 LAB — TSH: TSH: 1.44 u[IU]/mL (ref 0.450–4.500)

## 2017-12-11 ENCOUNTER — Ambulatory Visit (INDEPENDENT_AMBULATORY_CARE_PROVIDER_SITE_OTHER): Payer: 59 | Admitting: Family Medicine

## 2017-12-25 ENCOUNTER — Ambulatory Visit (INDEPENDENT_AMBULATORY_CARE_PROVIDER_SITE_OTHER): Payer: 59 | Admitting: Family Medicine

## 2017-12-28 ENCOUNTER — Ambulatory Visit (INDEPENDENT_AMBULATORY_CARE_PROVIDER_SITE_OTHER): Payer: 59 | Admitting: Family Medicine

## 2017-12-28 VITALS — BP 122/82 | HR 72 | Temp 98.2°F | Ht 64.0 in | Wt 202.0 lb

## 2017-12-28 DIAGNOSIS — E669 Obesity, unspecified: Secondary | ICD-10-CM | POA: Diagnosis not present

## 2017-12-28 DIAGNOSIS — I10 Essential (primary) hypertension: Secondary | ICD-10-CM

## 2017-12-28 DIAGNOSIS — E559 Vitamin D deficiency, unspecified: Secondary | ICD-10-CM | POA: Diagnosis not present

## 2017-12-28 DIAGNOSIS — Z9189 Other specified personal risk factors, not elsewhere classified: Secondary | ICD-10-CM | POA: Diagnosis not present

## 2017-12-28 DIAGNOSIS — Z6834 Body mass index (BMI) 34.0-34.9, adult: Secondary | ICD-10-CM

## 2017-12-28 MED ORDER — VITAMIN D (ERGOCALCIFEROL) 1.25 MG (50000 UNIT) PO CAPS: 50000.0000 [IU] | ORAL_CAPSULE | ORAL | 0 refills | Status: DC

## 2017-12-28 NOTE — Progress Notes (Signed)
Office: [phone]  /  Fax: [phone]   HPI:   Chief Complaint: OBESITY [name] is here to discuss her progress with her obesity treatment plan. She is on the Vegan plant based plan and is following her eating plan approximately 80 % of the time. She states she is exercising 0 minutes 0 times per week. [name] went to [place] for a wedding and had her birthday last week, and had coconut creme pie in the fridge. She finds that not being home to be obstacles. She has quite a bit of travels planned in the near future.  Her weight is 202 lb (91.6 kg) today and has gained 6 pounds since her last visit. She has lost 2 lbs since starting treatment with [place].  Vitamin D Deficiency [name] has a diagnosis of vitamin D deficiency. She is currently taking prescription Vit D and denies nausea, vomiting or muscle weakness. Labs show an increase in Vit D level, but still below goal of 50.  At risk for osteopenia and osteoporosis [name] is at higher risk of osteopenia and osteoporosis due to vitamin D deficiency.   Hypertension [name] is a 47 y.o. female with hypertension. [name]'s blood pressure is controlled today. She denies chest pain, chest pressure, or headaches. She is working weight loss to help control her blood pressure with the goal of decreasing her risk of heart attack and stroke.   ALLERGIES: Allergies  Allergen Reactions  . Betadine [Povidone Iodine] Other (See Comments)    Dx with allergy testing  . Cymbalta [Duloxetine Hcl] Other (See Comments)    Makes her feel crazy, out of body  . Iodine   . Shellfish Allergy Other (See Comments)    Dx with allergy testing    MEDICATIONS: Current Outpatient Medications on File Prior to Visit  Medication Sig Dispense Refill  . estradiol (VIVELLE-DOT) 0.05 MG/24HR Place 1 patch onto the skin 2 (two) times a week. Monday and Thursday    . hyoscyamine (LEVSIN SL) 0.125 MG SL tablet Place 1 tablet (0.125 mg total) under the tongue every 4  (four) hours as needed. 30 tablet 1  . ibuprofen (ADVIL,MOTRIN) 200 MG tablet Take 200 mg by mouth every 6 (six) hours as needed.    [name] lisinopril (PRINIVIL,ZESTRIL) 10 MG tablet Take 1 tablet (10 mg total) by mouth daily. 90 tablet 1  . Multiple Vitamins-Minerals (MULTIVITAMIN ADULT PO) Take by mouth.    . Vitamin D, Ergocalciferol, (DRISDOL) 50000 units CAPS capsule Take 1 capsule (50,000 Units total) by mouth every 7 (seven) days. 4 capsule 0   No current facility-administered medications on file prior to visit.     PAST MEDICAL HISTORY: Past Medical History:  Diagnosis Date  . Acute mastitis of left breast 10/04/2007  . AMA (advanced maternal age) multigravida 18+   . Cancer ([place]) 2002   Left leg  . Celiac disease   . Chronic back pain    H/O  . Complication of anesthesia    Difficult time waking up  . Constipation in pregnancy 03/2006  . Endometriosis    h/o  . Environmental allergies   . Fibroid    During first pregnancy  . Fibromyalgia   . First trimester bleeding 2008  . GBS carrier   . GERD (gastroesophageal reflux disease)   . Granuloma annulare   . H/O amenorrhea 10/2006  . H/O blood clots    7 yr ago in arm  . H/O varicella    As an infant.  [name]  Headache(784.0)   . Hearing loss 08/15/2016  . Heart murmur   . HTN (hypertension)   . Hyperlipidemia 03/02/2017  . IBS (irritable bowel syndrome) 08/15/2016  . Irregular uterine bleeding   . Joint pain   . Lactose intolerance   . Leg edema   . Leg pain, left 02/09/2016  . Monilial vaginitis 07/27/04  . Palpitations   . Rh negative status during pregnancy   . SAB (spontaneous abortion)    h/o x 2  . Seasonal allergies   . Sleep apnea 08/15/2016  . TMJ syndrome   . Vegetarianism 08/15/2016  . Vitamin D deficiency 08/16/2015    PAST SURGICAL HISTORY: Past Surgical History:  Procedure Laterality Date  . ABDOMINAL HYSTERECTOMY  2013  . APPENDECTOMY  2002  . DILATION AND EVACUATION    . endocervical polyp removal   2001  . ENDOMETRIAL ABLATION  2010  . HERNIA REPAIR  05/16/12   incisional hernia repair  . HYSTEROSCOPY W/ ENDOMETRIAL ABLATION  01/28/2009  . INSERTION OF MESH N/A 05/16/2012   Procedure: INSERTION OF MESH;  Surgeon: Joyice Faster. Cornett, MD;  Location: [facility];  Service: General;  Laterality: N/A;  . KNEE ARTHROSCOPY Left 12/05/2012   Procedure: LEFT KNEE ARTHROSCOPY;  Surgeon: Kerin Salen, MD;  Location: [facility];  Service: Orthopedics;  Laterality: Left;  [name] MELANOMA EXCISION  2002   Left Leg  . melanoma removal   2003  . SALPINGOOPHORECTOMY  12/08/2011   Procedure: SALPINGO OOPHERECTOMY;  Surgeon: Eldred Manges, MD;  Location: [facility];  Service: Gynecology;  Laterality: Bilateral;  . TENDON RELEASE  2004  . uterine curettage  01/2009  . VENTRAL HERNIA REPAIR N/A 05/16/2012   Procedure: LAPAROSCOPIC VENTRAL HERNIA;  Surgeon: Joyice Faster. Cornett, MD;  Location: [facility];  Service: General;  Laterality: N/A;  . WISDOM TOOTH EXTRACTION  1989    SOCIAL HISTORY: Social History   Tobacco Use  . Smoking status: Former Smoker    Types: Cigarettes    Last attempt to quit: 02/29/1988    Years since quitting: 29.8  . Smokeless tobacco: Never Used  Substance Use Topics  . Alcohol use: No  . Drug use: No    FAMILY HISTORY: Family History  Problem Relation Age of Onset  . Hypertension Mother   . Cancer Mother        breast  . Heart disease Mother   . Anxiety disorder Mother   . Heart disease Father        atrial fib  . Hypertension Father   . Cancer Father        Lyphoma,Bladder  . Atrial fibrillation Father   . Obesity Father   . Cancer Maternal Grandmother        breast  . Heart disease Maternal Grandfather        MI  . Heart disease Paternal Grandfather        MI  . Cancer Maternal Aunt        ovarian  . Diverticulitis Brother     ROS: Review of Systems  Constitutional: Negative for weight loss.  Cardiovascular: Negative for chest pain.       Negative  chest pressure  Gastrointestinal: Negative for nausea and vomiting.  Musculoskeletal:       Negative muscle weakness  Neurological: Negative for headaches.    PHYSICAL EXAM: Blood pressure 122/82, pulse 72, temperature 98.2 F (36.8 C), temperature source Oral, height 5\' 4"  (1.626 m), weight 202 lb (  91.6 kg), last menstrual period 02/22/2011, SpO2 97 %. Body mass index is 34.67 kg/m. Physical Exam  Constitutional: She is oriented to person, place, and time. She appears well-developed and well-nourished.  Cardiovascular: Normal rate.  Pulmonary/Chest: Effort normal.  Musculoskeletal: Normal range of motion.  Neurological: She is oriented to person, place, and time.  Skin: Skin is warm and dry.  Psychiatric: She has a normal mood and affect. Her behavior is normal.  Vitals reviewed.   RECENT LABS AND TESTS: BMET    Component Value Date/Time   NA 142 11/20/2017 0944   K 3.5 11/20/2017 0944   CL 99 11/20/2017 0944   CO2 26 11/20/2017 0944   GLUCOSE 100 (H) 11/20/2017 0944   GLUCOSE 105 (H) 03/02/2017 0844   BUN 11 11/20/2017 0944   CREATININE 0.56 (L) 11/20/2017 0944   CALCIUM 10.0 11/20/2017 0944   GFRNONAA 111 11/20/2017 0944   GFRAA 128 11/20/2017 0944   Lab Results  Component Value Date   HGBA1C 5.2 06/20/2017   Lab Results  Component Value Date   INSULIN 10.7 06/20/2017   CBC    Component Value Date/Time   WBC 8.7 06/20/2017 1155   WBC 7.0 03/02/2017 0844   RBC 4.77 06/20/2017 1155   RBC 5.10 03/02/2017 0844   HGB 14.2 06/20/2017 1155   HCT 41.8 06/20/2017 1155   PLT 364.0 03/02/2017 0844   MCV 88 06/20/2017 1155   MCH 29.8 06/20/2017 1155   MCH 29.0 05/17/2012 0640   MCHC 34.0 06/20/2017 1155   MCHC 33.4 03/02/2017 0844   RDW 13.7 06/20/2017 1155   LYMPHSABS 2.5 06/20/2017 1155   MONOABS 0.5 02/02/2016 0926   EOSABS 0.4 06/20/2017 1155   BASOSABS 0.0 06/20/2017 1155   Iron/TIBC/Ferritin/ %Sat No results found for: IRON, TIBC, FERRITIN,  IRONPCTSAT Lipid Panel     Component Value Date/Time   CHOL 143 11/20/2017 0944   TRIG 92 11/20/2017 0944   HDL 42 11/20/2017 0944   CHOLHDL 4 03/02/2017 0844   VLDL 21.2 03/02/2017 0844   LDLCALC 83 11/20/2017 0944   Hepatic Function Panel     Component Value Date/Time   PROT 6.6 11/20/2017 0944   ALBUMIN 4.6 11/20/2017 0944   AST 17 11/20/2017 0944   ALT 19 11/20/2017 0944   ALKPHOS 67 11/20/2017 0944   BILITOT 0.4 11/20/2017 0944   BILIDIR 0.1 01/28/2015 0946      Component Value Date/Time   TSH 1.440 11/20/2017 0944   TSH 1.050 06/20/2017 1155   TSH 1.79 03/02/2017 0844    ASSESSMENT AND PLAN: Vitamin D deficiency - Plan: Vitamin D, Ergocalciferol, (DRISDOL) 50000 units CAPS capsule  Essential hypertension  At risk for osteoporosis  Class 1 obesity with serious comorbidity and body mass index (BMI) of 34.0 to 34.9 in adult, unspecified obesity type  PLAN:  Vitamin D Deficiency Verdia was informed that low vitamin D levels contributes to fatigue and are associated with obesity, breast, and colon cancer. Josanna agrees to continue taking prescription Vit D @50 ,000 IU every week #4 and we will refill for 1 month. She will follow up for routine testing of vitamin D, at least 2-3 times per year. She was informed of the risk of over-replacement of vitamin D and agrees to not increase her dose unless she discusses this with [place] first. Nykia agrees to follow up with our clinic in 2 weeks.  At risk for osteopenia and osteoporosis Shaneen was given extended (15 minutes) osteoporosis prevention counseling today. [name]  is at risk for osteopenia and osteoporsis due to her vitamin D deficiency. She was encouraged to take her vitamin D and follow her higher calcium diet and increase strengthening exercise to help strengthen her bones and decrease her risk of osteopenia and osteoporosis.  Hypertension We discussed sodium restriction, working on healthy weight loss, and a regular  exercise program as the means to achieve improved blood pressure control. [name] agreed with this plan and agreed to follow up as directed. We will continue to monitor her blood pressure as well as her progress with the above lifestyle modifications. Breelyn agrees to continue taking lisinopril and will watch for signs of hypotension as she continues her lifestyle modifications. Glennice agrees to follow up with our clinic in 2 weeks.  Obesity Kacelyn is currently in the action stage of change. As such, her goal is to continue with weight loss efforts She has agreed to keep a food journal with 1300-1400 calories and 75+ grams of protein daily Harlie has been instructed to work up to a goal of 150 minutes of combined cardio and strengthening exercise per week or physical activity for 15 minutes 2 times per week for weight loss and overall health benefits. We discussed the following Behavioral Modification Strategies today: increasing lean protein intake, increasing vegetables, work on meal planning and easy cooking plans, planning for success, and keep a strict food journal   Kinzi has agreed to follow up with our clinic in 2 weeks. She was informed of the importance of frequent follow up visits to maximize her success with intensive lifestyle modifications for her multiple health conditions.   OBESITY BEHAVIORAL INTERVENTION VISIT  Today's visit was # 10   Starting weight: 204 lbs Starting date: 06/20/17 Today's weight : 202 lbs  Today's date: 12/28/2017 Total lbs lost to date: 2    ASK: We discussed the diagnosis of obesity with [name] today and [name] agreed to give [place] permission to discuss obesity behavioral modification therapy today.  ASSESS: Genoveva has the diagnosis of obesity and her BMI today is 34.66 Donnika is in the action stage of change   ADVISE: Mailee was educated on the multiple health risks of obesity as well as the benefit of weight loss to improve her health. She was  advised of the need for long term treatment and the importance of lifestyle modifications to improve her current health and to decrease her risk of future health problems.  AGREE: Multiple dietary modification options and treatment options were discussed and  Marrietta agreed to follow the recommendations documented in the above note.  ARRANGE: Sanai was educated on the importance of frequent visits to treat obesity as outlined per CMS and USPSTF guidelines and agreed to schedule her next follow up appointment today.  I, Trixie Dredge, am acting as transcriptionist for Ilene Qua, MD  I have reviewed the above documentation for accuracy and completeness, and I agree with the above. - Ilene Qua, MD

## 2018-01-11 ENCOUNTER — Ambulatory Visit (INDEPENDENT_AMBULATORY_CARE_PROVIDER_SITE_OTHER): Payer: 59 | Admitting: Family Medicine

## 2018-01-11 VITALS — BP 119/77 | HR 74 | Temp 97.7°F | Ht 64.0 in | Wt 200.0 lb

## 2018-01-11 DIAGNOSIS — Z6834 Body mass index (BMI) 34.0-34.9, adult: Secondary | ICD-10-CM

## 2018-01-11 DIAGNOSIS — E669 Obesity, unspecified: Secondary | ICD-10-CM

## 2018-01-11 DIAGNOSIS — E559 Vitamin D deficiency, unspecified: Secondary | ICD-10-CM

## 2018-01-11 DIAGNOSIS — I10 Essential (primary) hypertension: Secondary | ICD-10-CM

## 2018-01-17 NOTE — Progress Notes (Signed)
Office: [phone]  /  Fax: [phone]   HPI:   Chief Complaint: OBESITY [name] is here to discuss her progress with her obesity treatment plan. She is on the keep a food journal with 1300-1400 calories and 75+ grams of protein daily and is following her eating plan approximately 95 % of the time. She states she is exercising 0 minutes 0 times per week. [name] has journaled everything in the past few weeks. She is not always hitting protein goal. She hasn't exercised secondary to her son's health issues.  Her weight is 200 lb (90.7 kg) today and has had a weight loss of 2 pounds over a period of 2 weeks since her last visit. She has lost 4 lbs since starting treatment with [place].  [name] is a 47 y.o. female with Natalie. [name]'s blood pressure is controlled today. She denies chest pain, chest pressure, or headaches. She is working weight loss to help control her blood pressure with the goal of decreasing her risk of heart attack and stroke.   Vitamin D Deficiency [name] has a diagnosis of vitamin D deficiency. She is currently taking prescription Vit D. She notes improving fatigue and denies nausea, vomiting or muscle weakness.  ALLERGIES: Allergies  Allergen Reactions  . Betadine [Povidone Iodine] Other (See Comments)    Dx with allergy testing  . Cymbalta [Duloxetine Hcl] Other (See Comments)    Makes her feel crazy, out of body  . Iodine   . Shellfish Allergy Other (See Comments)    Dx with allergy testing    MEDICATIONS: Current Outpatient Medications on File Prior to Visit  Medication Sig Dispense Refill  . estradiol (VIVELLE-DOT) 0.05 MG/24HR Place 1 patch onto the skin 2 (two) times a week. Monday and Thursday    . hyoscyamine (LEVSIN SL) 0.125 MG SL tablet Place 1 tablet (0.125 mg total) under the tongue every 4 (four) hours as needed. 30 tablet 1  . ibuprofen (ADVIL,MOTRIN) 200 MG tablet Take 200 mg by mouth every 6 (six) hours as needed.    [name]  lisinopril (PRINIVIL,ZESTRIL) 10 MG tablet Take 1 tablet (10 mg total) by mouth daily. 90 tablet 1  . Multiple Vitamins-Minerals (MULTIVITAMIN ADULT PO) Take by mouth.    . Vitamin D, Ergocalciferol, (DRISDOL) 50000 units CAPS capsule Take 1 capsule (50,000 Units total) by mouth every 7 (seven) days. 4 capsule 0   No current facility-administered medications on file prior to visit.     PAST MEDICAL HISTORY: Past Medical History:  Diagnosis Date  . Acute mastitis of left breast 10/04/2007  . AMA (advanced maternal age) multigravida 43+   . Cancer ([place]) 2002   Left leg  . Celiac disease   . Chronic back pain    H/O  . Complication of anesthesia    Difficult time waking up  . Constipation in pregnancy 03/2006  . Endometriosis    h/o  . Environmental allergies   . Fibroid    During first pregnancy  . Fibromyalgia   . First trimester bleeding 2008  . GBS carrier   . GERD (gastroesophageal reflux disease)   . Granuloma annulare   . H/O amenorrhea 10/2006  . H/O blood clots    7 yr ago in arm  . H/O varicella    As an infant.  . Headache(784.0)   . Hearing loss 08/15/2016  . Heart murmur   . HTN (Natalie)   . Hyperlipidemia 03/02/2017  . IBS (irritable bowel syndrome) 08/15/2016  .  Irregular uterine bleeding   . Joint pain   . Lactose intolerance   . Leg edema   . Leg pain, left 02/09/2016  . Monilial vaginitis 07/27/04  . Palpitations   . Rh negative status during pregnancy   . SAB (spontaneous abortion)    h/o x 2  . Seasonal allergies   . Sleep apnea 08/15/2016  . TMJ syndrome   . Vegetarianism 08/15/2016  . Vitamin D deficiency 08/16/2015    PAST SURGICAL HISTORY: Past Surgical History:  Procedure Laterality Date  . ABDOMINAL HYSTERECTOMY  2013  . APPENDECTOMY  2002  . DILATION AND EVACUATION    . endocervical polyp removal  2001  . ENDOMETRIAL ABLATION  2010  . HERNIA REPAIR  05/16/12   incisional hernia repair  . HYSTEROSCOPY W/ ENDOMETRIAL ABLATION   01/28/2009  . INSERTION OF MESH N/A 05/16/2012   Procedure: INSERTION OF MESH;  Surgeon: Joyice Faster. Cornett, MD;  Location: [facility];  Service: General;  Laterality: N/A;  . KNEE ARTHROSCOPY Left 12/05/2012   Procedure: LEFT KNEE ARTHROSCOPY;  Surgeon: Kerin Salen, MD;  Location: [facility];  Service: Orthopedics;  Laterality: Left;  [name] MELANOMA EXCISION  2002   Left Leg  . melanoma removal   2003  . SALPINGOOPHORECTOMY  12/08/2011   Procedure: SALPINGO OOPHERECTOMY;  Surgeon: Eldred Manges, MD;  Location: [facility];  Service: Gynecology;  Laterality: Bilateral;  . TENDON RELEASE  2004  . uterine curettage  01/2009  . VENTRAL HERNIA REPAIR N/A 05/16/2012   Procedure: LAPAROSCOPIC VENTRAL HERNIA;  Surgeon: Joyice Faster. Cornett, MD;  Location: [facility];  Service: General;  Laterality: N/A;  . WISDOM TOOTH EXTRACTION  1989    SOCIAL HISTORY: Social History   Tobacco Use  . Smoking status: Former Smoker    Types: Cigarettes    Last attempt to quit: 02/29/1988    Years since quitting: 29.9  . Smokeless tobacco: Never Used  Substance Use Topics  . Alcohol use: No  . Drug use: No    FAMILY HISTORY: Family History  Problem Relation Age of Onset  . Natalie Mother   . Cancer Mother        breast  . Heart disease Mother   . Anxiety disorder Mother   . Heart disease Father        atrial fib  . Natalie Father   . Cancer Father        Lyphoma,Bladder  . Atrial fibrillation Father   . Obesity Father   . Cancer Maternal Grandmother        breast  . Heart disease Maternal Grandfather        MI  . Heart disease Paternal Grandfather        MI  . Cancer Maternal Aunt        ovarian  . Diverticulitis Brother     ROS: Review of Systems  Constitutional: Positive for malaise/fatigue and weight loss.  Cardiovascular: Negative for chest pain.       Negative chest pressure  Gastrointestinal: Negative for nausea and vomiting.  Musculoskeletal:       Negative muscle  weakness  Neurological: Negative for headaches.    PHYSICAL EXAM: Blood pressure 119/77, pulse 74, temperature 97.7 F (36.5 C), temperature source Oral, height 5\' 4"  (1.626 m), weight 200 lb (90.7 kg), last menstrual period 02/22/2011, SpO2 99 %. Body mass index is 34.33 kg/m. Physical Exam  Constitutional: She is oriented to person, place, and time. She  appears well-developed and well-nourished.  Cardiovascular: Normal rate.  Pulmonary/Chest: Effort normal.  Musculoskeletal: Normal range of motion.  Neurological: She is oriented to person, place, and time.  Skin: Skin is warm and dry.  Psychiatric: She has a normal mood and affect. Her behavior is normal.  Vitals reviewed.   RECENT LABS AND TESTS: BMET    Component Value Date/Time   NA 142 11/20/2017 0944   K 3.5 11/20/2017 0944   CL 99 11/20/2017 0944   CO2 26 11/20/2017 0944   GLUCOSE 100 (H) 11/20/2017 0944   GLUCOSE 105 (H) 03/02/2017 0844   BUN 11 11/20/2017 0944   CREATININE 0.56 (L) 11/20/2017 0944   CALCIUM 10.0 11/20/2017 0944   GFRNONAA 111 11/20/2017 0944   GFRAA 128 11/20/2017 0944   Lab Results  Component Value Date   HGBA1C 5.2 06/20/2017   Lab Results  Component Value Date   INSULIN 10.7 06/20/2017   CBC    Component Value Date/Time   WBC 8.7 06/20/2017 1155   WBC 7.0 03/02/2017 0844   RBC 4.77 06/20/2017 1155   RBC 5.10 03/02/2017 0844   HGB 14.2 06/20/2017 1155   HCT 41.8 06/20/2017 1155   PLT 364.0 03/02/2017 0844   MCV 88 06/20/2017 1155   MCH 29.8 06/20/2017 1155   MCH 29.0 05/17/2012 0640   MCHC 34.0 06/20/2017 1155   MCHC 33.4 03/02/2017 0844   RDW 13.7 06/20/2017 1155   LYMPHSABS 2.5 06/20/2017 1155   MONOABS 0.5 02/02/2016 0926   EOSABS 0.4 06/20/2017 1155   BASOSABS 0.0 06/20/2017 1155   Iron/TIBC/Ferritin/ %Sat No results found for: IRON, TIBC, FERRITIN, IRONPCTSAT Lipid Panel     Component Value Date/Time   CHOL 143 11/20/2017 0944   TRIG 92 11/20/2017 0944   HDL 42  11/20/2017 0944   CHOLHDL 4 03/02/2017 0844   VLDL 21.2 03/02/2017 0844   LDLCALC 83 11/20/2017 0944   Hepatic Function Panel     Component Value Date/Time   PROT 6.6 11/20/2017 0944   ALBUMIN 4.6 11/20/2017 0944   AST 17 11/20/2017 0944   ALT 19 11/20/2017 0944   ALKPHOS 67 11/20/2017 0944   BILITOT 0.4 11/20/2017 0944   BILIDIR 0.1 01/28/2015 0946      Component Value Date/Time   TSH 1.440 11/20/2017 0944   TSH 1.050 06/20/2017 1155   TSH 1.79 03/02/2017 0844  Results for RHONA, FUSILIER (MRN [id number]) as of 01/17/2018 07:42  Ref. Range 11/20/2017 09:44  Vitamin D, 25-Hydroxy Latest Ref Range: 30.0 - 100.0 ng/mL 37.1    ASSESSMENT AND PLAN: Essential Natalie  Vitamin D deficiency  Class 1 obesity with serious comorbidity and body mass index (BMI) of 34.0 to 34.9 in adult, unspecified obesity type  PLAN:  Natalie We discussed sodium restriction, working on healthy weight loss, and a regular exercise program as the means to achieve improved blood pressure control. [name] agreed with this plan and agreed to follow up as directed. We will continue to monitor her blood pressure as well as her progress with the above lifestyle modifications. Monita agrees to continue taking lisinopril and will watch for signs of hypotension as she continues her lifestyle modifications. Abbegail agrees to follow up with our clinic in 3 weeks.  Vitamin D Deficiency Yuridia was informed that low vitamin D levels contributes to fatigue and are associated with obesity, breast, and colon cancer. She agrees to continue to take prescription Vit D @50 ,000 IU every week and will follow up for routine  testing of vitamin D, at least 2-3 times per year. She was informed of the risk of over-replacement of vitamin D and agrees to not increase her dose unless she discusses this with [place] first.  I spent > than 50% of the 15 minute visit on counseling as documented in the note.  Obesity Atia is currently in  the action stage of change. As such, her goal is to continue with weight loss efforts She has agreed to keep a food journal with 1300-1400 calories and 75+ grams of protein daily Olevia has been instructed to work up to a goal of 150 minutes of combined cardio and strengthening exercise per week for weight loss and overall health benefits. We discussed the following Behavioral Modification Strategies today: increasing lean protein intake, increasing vegetables, work on meal planning and easy cooking plans, and planning for success   Agueda has agreed to follow up with our clinic in 3 weeks. She was informed of the importance of frequent follow up visits to maximize her success with intensive lifestyle modifications for her multiple health conditions.   OBESITY BEHAVIORAL INTERVENTION VISIT  Today's visit was # 11   Starting weight: 204 lbs Starting date: 06/20/17 Today's weight : 200 lbs  Today's date: 01/11/2018 Total lbs lost to date: 4    ASK: We discussed the diagnosis of obesity with [name] today and [name] agreed to give [place] permission to discuss obesity behavioral modification therapy today.  ASSESS: Cattleya has the diagnosis of obesity and her BMI today is 34.31 Khiya is in the action stage of change   ADVISE: Jazman was educated on the multiple health risks of obesity as well as the benefit of weight loss to improve her health. She was advised of the need for long term treatment and the importance of lifestyle modifications to improve her current health and to decrease her risk of future health problems.  AGREE: Multiple dietary modification options and treatment options were discussed and  Rafia agreed to follow the recommendations documented in the above note.  ARRANGE: Nikoletta was educated on the importance of frequent visits to treat obesity as outlined per CMS and USPSTF guidelines and agreed to schedule her next follow up appointment today.  I, Trixie Dredge, am  acting as transcriptionist for Ilene Qua, MD  I have reviewed the above documentation for accuracy and completeness, and I agree with the above. - Ilene Qua, MD

## 2018-01-26 ENCOUNTER — Encounter (INDEPENDENT_AMBULATORY_CARE_PROVIDER_SITE_OTHER): Payer: Self-pay | Admitting: Family Medicine

## 2018-01-29 NOTE — Telephone Encounter (Signed)
Please address. Thanks!

## 2018-01-30 ENCOUNTER — Ambulatory Visit (INDEPENDENT_AMBULATORY_CARE_PROVIDER_SITE_OTHER): Payer: 59 | Admitting: Family Medicine

## 2018-02-09 ENCOUNTER — Encounter (INDEPENDENT_AMBULATORY_CARE_PROVIDER_SITE_OTHER): Payer: Self-pay | Admitting: Family Medicine

## 2018-02-12 NOTE — Telephone Encounter (Signed)
Please reschedule

## 2018-02-13 ENCOUNTER — Encounter (INDEPENDENT_AMBULATORY_CARE_PROVIDER_SITE_OTHER): Payer: Self-pay

## 2018-02-13 ENCOUNTER — Ambulatory Visit (INDEPENDENT_AMBULATORY_CARE_PROVIDER_SITE_OTHER): Payer: 59 | Admitting: Family Medicine

## 2018-03-06 DIAGNOSIS — L309 Dermatitis, unspecified: Secondary | ICD-10-CM | POA: Diagnosis not present

## 2018-03-09 ENCOUNTER — Ambulatory Visit: Payer: 59 | Admitting: Family Medicine

## 2018-03-09 DIAGNOSIS — G8929 Other chronic pain: Secondary | ICD-10-CM

## 2018-03-09 DIAGNOSIS — M546 Pain in thoracic spine: Secondary | ICD-10-CM | POA: Diagnosis not present

## 2018-03-09 DIAGNOSIS — E559 Vitamin D deficiency, unspecified: Secondary | ICD-10-CM | POA: Diagnosis not present

## 2018-03-09 DIAGNOSIS — I10 Essential (primary) hypertension: Secondary | ICD-10-CM

## 2018-03-09 DIAGNOSIS — K219 Gastro-esophageal reflux disease without esophagitis: Secondary | ICD-10-CM | POA: Diagnosis not present

## 2018-03-09 NOTE — Assessment & Plan Note (Signed)
Well controlled, no changes to meds. Encouraged heart healthy diet such as the DASH diet and exercise as tolerated.  °

## 2018-03-09 NOTE — Patient Instructions (Signed)
Take Vitamin D 1000 to 2000 IU daily Hypertension Hypertension, commonly called high blood pressure, is when the force of blood pumping through the arteries is too strong. The arteries are the blood vessels that carry blood from the heart throughout the body. Hypertension forces the heart to work harder to pump blood and may cause arteries to become narrow or stiff. Having untreated or uncontrolled hypertension can cause heart attacks, strokes, kidney disease, and other problems. A blood pressure reading consists of a higher number over a lower number. Ideally, your blood pressure should be below 120/80. The first ("top") number is called the systolic pressure. It is a measure of the pressure in your arteries as your heart beats. The second ("bottom") number is called the diastolic pressure. It is a measure of the pressure in your arteries as the heart relaxes. What are the causes? The cause of this condition is not known. What increases the risk? Some risk factors for high blood pressure are under your control. Others are not. Factors you can change  Smoking.  Having type 2 diabetes mellitus, high cholesterol, or both.  Not getting enough exercise or physical activity.  Being overweight.  Having too much fat, sugar, calories, or salt (sodium) in your diet.  Drinking too much alcohol. Factors that are difficult or impossible to change  Having chronic kidney disease.  Having a family history of high blood pressure.  Age. Risk increases with age.  Race. You may be at higher risk if you are African-American.  Gender. Men are at higher risk than women before age 35. After age 75, women are at higher risk than men.  Having obstructive sleep apnea.  Stress. What are the signs or symptoms? Extremely high blood pressure (hypertensive crisis) may cause:  Headache.  Anxiety.  Shortness of breath.  Nosebleed.  Nausea and vomiting.  Severe chest pain.  Jerky movements you  cannot control (seizures). How is this diagnosed? This condition is diagnosed by measuring your blood pressure while you are seated, with your arm resting on a surface. The cuff of the blood pressure monitor will be placed directly against the skin of your upper arm at the level of your heart. It should be measured at least twice using the same arm. Certain conditions can cause a difference in blood pressure between your right and left arms. Certain factors can cause blood pressure readings to be lower or higher than normal (elevated) for a short period of time:  When your blood pressure is higher when you are in a health care provider's office than when you are at home, this is called white coat hypertension. Most people with this condition do not need medicines.  When your blood pressure is higher at home than when you are in a health care provider's office, this is called masked hypertension. Most people with this condition may need medicines to control blood pressure. If you have a high blood pressure reading during one visit or you have normal blood pressure with other risk factors:  You may be asked to return on a different day to have your blood pressure checked again.  You may be asked to monitor your blood pressure at home for 1 week or longer. If you are diagnosed with hypertension, you may have other blood or imaging tests to help your health care provider understand your overall risk for other conditions. How is this treated? This condition is treated by making healthy lifestyle changes, such as eating healthy foods, exercising more, and  reducing your alcohol intake. Your health care provider may prescribe medicine if lifestyle changes are not enough to get your blood pressure under control, and if:  Your systolic blood pressure is above 130.  Your diastolic blood pressure is above 80. Your personal target blood pressure may vary depending on your medical conditions, your age, and  other factors. Follow these instructions at home: Eating and drinking   Eat a diet that is high in fiber and potassium, and low in sodium, added sugar, and fat. An example eating plan is called the DASH (Dietary Approaches to Stop Hypertension) diet. To eat this way: ? Eat plenty of fresh fruits and vegetables. Try to fill half of your plate at each meal with fruits and vegetables. ? Eat whole grains, such as whole wheat pasta, brown rice, or whole grain bread. Fill about one quarter of your plate with whole grains. ? Eat or drink low-fat dairy products, such as skim milk or low-fat yogurt. ? Avoid fatty cuts of meat, processed or cured meats, and poultry with skin. Fill about one quarter of your plate with lean proteins, such as fish, chicken without skin, beans, eggs, and tofu. ? Avoid premade and processed foods. These tend to be higher in sodium, added sugar, and fat.  Reduce your daily sodium intake. Most people with hypertension should eat less than 1,500 mg of sodium a day.  Limit alcohol intake to no more than 1 drink a day for nonpregnant women and 2 drinks a day for men. One drink equals 12 oz of beer, 5 oz of wine, or 1 oz of hard liquor. Lifestyle   Work with your health care provider to maintain a healthy body weight or to lose weight. Ask what an ideal weight is for you.  Get at least 30 minutes of exercise that causes your heart to beat faster (aerobic exercise) most days of the week. Activities may include walking, swimming, or biking.  Include exercise to strengthen your muscles (resistance exercise), such as pilates or lifting weights, as part of your weekly exercise routine. Try to do these types of exercises for 30 minutes at least 3 days a week.  Do not use any products that contain nicotine or tobacco, such as cigarettes and e-cigarettes. If you need help quitting, ask your health care provider.  Monitor your blood pressure at home as told by your health care  provider.  Keep all follow-up visits as told by your health care provider. This is important. Medicines  Take over-the-counter and prescription medicines only as told by your health care provider. Follow directions carefully. Blood pressure medicines must be taken as prescribed.  Do not skip doses of blood pressure medicine. Doing this puts you at risk for problems and can make the medicine less effective.  Ask your health care provider about side effects or reactions to medicines that you should watch for. Contact a health care provider if:  You think you are having a reaction to a medicine you are taking.  You have headaches that keep coming back (recurring).  You feel dizzy.  You have swelling in your ankles.  You have trouble with your vision. Get help right away if:  You develop a severe headache or confusion.  You have unusual weakness or numbness.  You feel faint.  You have severe pain in your chest or abdomen.  You vomit repeatedly.  You have trouble breathing. Summary  Hypertension is when the force of blood pumping through your arteries is too  strong. If this condition is not controlled, it may put you at risk for serious complications.  Your personal target blood pressure may vary depending on your medical conditions, your age, and other factors. For most people, a normal blood pressure is less than 120/80.  Hypertension is treated with lifestyle changes, medicines, or a combination of both. Lifestyle changes include weight loss, eating a healthy, low-sodium diet, exercising more, and limiting alcohol. This information is not intended to replace advice given to you by your health care provider. Make sure you discuss any questions you have with your health care provider. Document Released: 02/14/2005 Document Revised: 01/13/2016 Document Reviewed: 01/13/2016 [name]  2019 [employer].

## 2018-03-09 NOTE — Assessment & Plan Note (Signed)
Upper back pain with shoulder pain and indentations in shoulders for years and is considering a breast reduction in the future. Has tried once before but insurance denied. Continue to attempt weight loss and reassess at next visit.

## 2018-03-09 NOTE — Assessment & Plan Note (Signed)
Doing well on vegan diet. Diminished processed oils, dairy has helped her gut.

## 2018-03-11 NOTE — Assessment & Plan Note (Signed)
Supplement and monitor 

## 2018-03-11 NOTE — Progress Notes (Signed)
Subjective:    Patient ID: [name], female    DOB: 1970/06/18, 48 y.o.   MRN: [id number]  No chief complaint on file.   HPI Patient is in today for follow up. Overall she is doing well. Continues to be a vegetarian and has lost some weight this year working with healthy weight and wellness. No polydipsia or polydipsia. No acute concerns. Denies CP/palp/SOB/HA/congestion/fevers/GI or GU c/o. Taking meds as prescribed  Past Medical History:  Diagnosis Date  . Acute mastitis of left breast 10/04/2007  . AMA (advanced maternal age) multigravida 34+   . Cancer ([place]) 2002   Left leg  . Celiac disease   . Chronic back pain    H/O  . Complication of anesthesia    Difficult time waking up  . Constipation in pregnancy 03/2006  . Endometriosis    h/o  . Environmental allergies   . Fibroid    During first pregnancy  . Fibromyalgia   . First trimester bleeding 2008  . GBS carrier   . GERD (gastroesophageal reflux disease)   . Granuloma annulare   . H/O amenorrhea 10/2006  . H/O blood clots    7 yr ago in arm  . H/O varicella    As an infant.  . Headache(784.0)   . Hearing loss 08/15/2016  . Heart murmur   . HTN (hypertension)   . Hyperlipidemia 03/02/2017  . IBS (irritable bowel syndrome) 08/15/2016  . Irregular uterine bleeding   . Joint pain   . Lactose intolerance   . Leg edema   . Leg pain, left 02/09/2016  . Monilial vaginitis 07/27/04  . Palpitations   . Rh negative status during pregnancy   . SAB (spontaneous abortion)    h/o x 2  . Seasonal allergies   . Sleep apnea 08/15/2016  . TMJ syndrome   . Vegetarianism 08/15/2016  . Vitamin D deficiency 08/16/2015    Past Surgical History:  Procedure Laterality Date  . ABDOMINAL HYSTERECTOMY  2013  . APPENDECTOMY  2002  . DILATION AND EVACUATION    . endocervical polyp removal  2001  . ENDOMETRIAL ABLATION  2010  . HERNIA REPAIR  05/16/12   incisional hernia repair  . HYSTEROSCOPY W/ ENDOMETRIAL ABLATION   01/28/2009  . INSERTION OF MESH N/A 05/16/2012   Procedure: INSERTION OF MESH;  Surgeon: Joyice Faster. Cornett, MD;  Location: [facility];  Service: General;  Laterality: N/A;  . KNEE ARTHROSCOPY Left 12/05/2012   Procedure: LEFT KNEE ARTHROSCOPY;  Surgeon: Kerin Salen, MD;  Location: [facility];  Service: Orthopedics;  Laterality: Left;  [name] MELANOMA EXCISION  2002   Left Leg  . melanoma removal   2003  . SALPINGOOPHORECTOMY  12/08/2011   Procedure: SALPINGO OOPHERECTOMY;  Surgeon: Eldred Manges, MD;  Location: [facility];  Service: Gynecology;  Laterality: Bilateral;  . TENDON RELEASE  2004  . uterine curettage  01/2009  . VENTRAL HERNIA REPAIR N/A 05/16/2012   Procedure: LAPAROSCOPIC VENTRAL HERNIA;  Surgeon: Joyice Faster. Cornett, MD;  Location: [facility];  Service: General;  Laterality: N/A;  . WISDOM TOOTH EXTRACTION  1989    Family History  Problem Relation Age of Onset  . Hypertension Mother   . Cancer Mother        breast  . Heart disease Mother   . Anxiety disorder Mother   . Heart disease Father        atrial fib  . Hypertension Father   .  Cancer Father        Lyphoma,Bladder  . Atrial fibrillation Father   . Obesity Father   . Cancer Maternal Grandmother        breast  . Heart disease Maternal Grandfather        MI  . Heart disease Paternal Grandfather        MI  . Cancer Maternal Aunt        ovarian  . Diverticulitis Brother     Social History   Socioeconomic History  . Marital status: Married    Spouse name: Psychiatric nurse  . Number of children: 3  . Years of education: Not on file  . Highest education level: Not on file  Occupational History  . Occupation: Armed forces operational officer  Social Needs  . Financial resource strain: Not on file  . Food insecurity:    Worry: Not on file    Inability: Not on file  . Transportation needs:    Medical: Not on file    Non-medical: Not on file  Tobacco Use  . Smoking status: Former Smoker    Types: Cigarettes    Last  attempt to quit: 02/29/1988    Years since quitting: 30.0  . Smokeless tobacco: Never Used  Substance and Sexual Activity  . Alcohol use: No  . Drug use: No  . Sexual activity: Yes    Birth control/protection: Surgical    Comment: BTL  Lifestyle  . Physical activity:    Days per week: Not on file    Minutes per session: Not on file  . Stress: Not on file  Relationships  . Social connections:    Talks on phone: Not on file    Gets together: Not on file    Attends religious service: Not on file    Active member of club or organization: Not on file    Attends meetings of clubs or organizations: Not on file    Relationship status: Not on file  . Intimate partner violence:    Fear of current or ex partner: Not on file    Emotionally abused: Not on file    Physically abused: Not on file    Forced sexual activity: Not on file  Other Topics Concern  . Not on file  Social History Narrative   Lives with husband and 3 children   Owns a [employer]   Vegetarian   Wears seat belt    Outpatient Medications Prior to Visit  Medication Sig Dispense Refill  . estradiol (VIVELLE-DOT) 0.05 MG/24HR Place 1 patch onto the skin 2 (two) times a week. Monday and Thursday    . lisinopril (PRINIVIL,ZESTRIL) 10 MG tablet Take 1 tablet (10 mg total) by mouth daily. 90 tablet 1  . Multiple Vitamins-Minerals (MULTIVITAMIN ADULT PO) Take by mouth.    . Vitamin D, Ergocalciferol, (DRISDOL) 50000 units CAPS capsule Take 1 capsule (50,000 Units total) by mouth every 7 (seven) days. 4 capsule 0  . hyoscyamine (LEVSIN SL) 0.125 MG SL tablet Place 1 tablet (0.125 mg total) under the tongue every 4 (four) hours as needed. 30 tablet 1  . ibuprofen (ADVIL,MOTRIN) 200 MG tablet Take 200 mg by mouth every 6 (six) hours as needed.     No facility-administered medications prior to visit.     Allergies  Allergen Reactions  . Betadine [Povidone Iodine] Other (See Comments)    Dx with allergy testing  .  Cymbalta [Duloxetine Hcl] Other (See Comments)    Makes her feel  crazy, out of body  . Iodine   . Shellfish Allergy Other (See Comments)    Dx with allergy testing    Review of Systems  Constitutional: Negative for fever and malaise/fatigue.  HENT: Negative for congestion.   Eyes: Negative for blurred vision.  Respiratory: Negative for shortness of breath.   Cardiovascular: Negative for chest pain, palpitations and leg swelling.  Gastrointestinal: Negative for abdominal pain, blood in stool and nausea.  Genitourinary: Negative for dysuria and frequency.  Musculoskeletal: Negative for falls.  Skin: Negative for rash.  Neurological: Negative for dizziness, loss of consciousness and headaches.  Endo/Heme/Allergies: Negative for environmental allergies.  Psychiatric/Behavioral: Negative for depression. The patient is not nervous/anxious.        Objective:    Physical Exam Vitals signs and nursing note reviewed.  Constitutional:      General: She is not in acute distress.    Appearance: She is well-developed.  HENT:     Head: Normocephalic and atraumatic.     Nose: Nose normal.  Eyes:     General:        Right eye: No discharge.        Left eye: No discharge.  Neck:     Musculoskeletal: Normal range of motion and neck supple.  Cardiovascular:     Rate and Rhythm: Normal rate and regular rhythm.     Heart sounds: No murmur.  Pulmonary:     Effort: Pulmonary effort is normal.     Breath sounds: Normal breath sounds.  Abdominal:     General: Bowel sounds are normal.     Palpations: Abdomen is soft.     Tenderness: There is no abdominal tenderness.  Skin:    General: Skin is warm and dry.  Neurological:     Mental Status: She is alert and oriented to person, place, and time.     BP 122/80 (BP Location: Left Arm, Patient Position: Sitting, Cuff Size: Normal)   Pulse 67   Temp 97.7 F (36.5 C) (Oral)   Resp 18   Ht 5\' 4"  (1.626 m)   Wt 213 lb 12.8 oz (97 kg)   LMP  02/22/2011   SpO2 98%   BMI 36.70 kg/m  Wt Readings from Last 3 Encounters:  03/09/18 213 lb 12.8 oz (97 kg)  01/11/18 200 lb (90.7 kg)  12/28/17 202 lb (91.6 kg)     Lab Results  Component Value Date   WBC 8.7 06/20/2017   HGB 14.2 06/20/2017   HCT 41.8 06/20/2017   PLT 364.0 03/02/2017   GLUCOSE 100 (H) 11/20/2017   CHOL 143 11/20/2017   TRIG 92 11/20/2017   HDL 42 11/20/2017   LDLCALC 83 11/20/2017   ALT 19 11/20/2017   AST 17 11/20/2017   NA 142 11/20/2017   K 3.5 11/20/2017   CL 99 11/20/2017   CREATININE 0.56 (L) 11/20/2017   BUN 11 11/20/2017   CO2 26 11/20/2017   TSH 1.440 11/20/2017   HGBA1C 5.2 06/20/2017    Lab Results  Component Value Date   TSH 1.440 11/20/2017   Lab Results  Component Value Date   WBC 8.7 06/20/2017   HGB 14.2 06/20/2017   HCT 41.8 06/20/2017   MCV 88 06/20/2017   PLT 364.0 03/02/2017   Lab Results  Component Value Date   NA 142 11/20/2017   K 3.5 11/20/2017   CO2 26 11/20/2017   GLUCOSE 100 (H) 11/20/2017   BUN 11 11/20/2017   CREATININE 0.56 (  L) 11/20/2017   BILITOT 0.4 11/20/2017   ALKPHOS 67 11/20/2017   AST 17 11/20/2017   ALT 19 11/20/2017   PROT 6.6 11/20/2017   ALBUMIN 4.6 11/20/2017   CALCIUM 10.0 11/20/2017   GFR 113.32 03/02/2017   Lab Results  Component Value Date   CHOL 143 11/20/2017   Lab Results  Component Value Date   HDL 42 11/20/2017   Lab Results  Component Value Date   LDLCALC 83 11/20/2017   Lab Results  Component Value Date   TRIG 92 11/20/2017   Lab Results  Component Value Date   CHOLHDL 4 03/02/2017   Lab Results  Component Value Date   HGBA1C 5.2 06/20/2017       Assessment & Plan:   Problem List Items Addressed This Visit    Essential hypertension    Well controlled, no changes to meds. Encouraged heart healthy diet such as the DASH diet and exercise as tolerated.       Back pain    Upper back pain with shoulder pain and indentations in shoulders for years and is  considering a breast reduction in the future. Has tried once before but insurance denied. Continue to attempt weight loss and reassess at next visit.      GERD (gastroesophageal reflux disease)    Doing well on vegan diet. Diminished processed oils, dairy has helped her gut.       Obesity    Encouraged DASH diet, decrease po intake and increase exercise as tolerated. Needs 7-8 hours of sleep nightly. Avoid trans fats, eat small, frequent meals every 4-5 hours with lean proteins, complex carbs and healthy fats. Minimize simple carbs, she has cleaned up her diet extensively with goo results.       Vitamin D deficiency    Supplement and monitor         I have discontinued [name] A. Bagheri's ibuprofen and hyoscyamine. I am also having her maintain her estradiol, Multiple Vitamins-Minerals (MULTIVITAMIN ADULT PO), lisinopril, and Vitamin D (Ergocalciferol).  No orders of the defined types were placed in this encounter.    Penni Homans, MD

## 2018-03-11 NOTE — Assessment & Plan Note (Signed)
Encouraged DASH diet, decrease po intake and increase exercise as tolerated. Needs 7-8 hours of sleep nightly. Avoid trans fats, eat small, frequent meals every 4-5 hours with lean proteins, complex carbs and healthy fats. Minimize simple carbs, she has cleaned up her diet extensively with goo results.

## 2018-03-12 ENCOUNTER — Other Ambulatory Visit: Payer: Self-pay | Admitting: Family Medicine

## 2018-04-05 ENCOUNTER — Encounter (INDEPENDENT_AMBULATORY_CARE_PROVIDER_SITE_OTHER): Payer: Self-pay

## 2018-04-05 ENCOUNTER — Ambulatory Visit (INDEPENDENT_AMBULATORY_CARE_PROVIDER_SITE_OTHER): Payer: 59 | Admitting: Family Medicine

## 2018-04-24 ENCOUNTER — Encounter (INDEPENDENT_AMBULATORY_CARE_PROVIDER_SITE_OTHER): Payer: Self-pay | Admitting: Family Medicine

## 2018-04-24 ENCOUNTER — Ambulatory Visit (INDEPENDENT_AMBULATORY_CARE_PROVIDER_SITE_OTHER): Payer: 59 | Admitting: Family Medicine

## 2018-04-24 VITALS — BP 132/85 | HR 67 | Temp 97.9°F | Ht 64.0 in | Wt 213.0 lb

## 2018-04-24 DIAGNOSIS — E559 Vitamin D deficiency, unspecified: Secondary | ICD-10-CM | POA: Diagnosis not present

## 2018-04-24 DIAGNOSIS — Z9189 Other specified personal risk factors, not elsewhere classified: Secondary | ICD-10-CM | POA: Diagnosis not present

## 2018-04-24 DIAGNOSIS — I10 Essential (primary) hypertension: Secondary | ICD-10-CM

## 2018-04-24 DIAGNOSIS — Z6836 Body mass index (BMI) 36.0-36.9, adult: Secondary | ICD-10-CM

## 2018-04-24 MED ORDER — VITAMIN D (ERGOCALCIFEROL) 1.25 MG (50000 UNIT) PO CAPS: 50000.0000 [IU] | ORAL_CAPSULE | ORAL | 0 refills | Status: DC

## 2018-04-25 NOTE — Progress Notes (Signed)
Office: [phone]  /  Fax: [phone]   HPI:   Chief Complaint: OBESITY [name] is here to discuss her progress with her obesity treatment plan. She is on the keep a food journal with 1300-1400 calories and 75+ grams of protein daily and is following her eating plan approximately 60 % of the time. She states she is exercising 0 minutes 0 times per week. [name] returned to our clinic after almost 3 months off, she was previously seen prior to the holidays. She is cooking vegan meals on the weekend and eating them, but indulging with food as extra food. She hasn't been tracking as agressively as previous.  Her weight is 213 lb (96.6 kg) today and has gained 13 pounds since her last visit. She has lost 0 lbs since starting treatment with [place].  Vitamin D Deficiency [name] has a diagnosis of vitamin D deficiency. She went and saw Dr. Charlett [name]. She is currently taking prescription Vit D. She notes fatigue and denies nausea, vomiting or muscle weakness.  At risk for osteopenia and osteoporosis [name] is at higher risk of osteopenia and osteoporosis due to vitamin D deficiency.   Hypertension [name] is a 48 y.o. female with hypertension. [name]'s blood pressure is controlled. She denies chest pain, chest pressure, or headaches. She is working on weight loss to help control her blood pressure with the goal of decreasing her risk of heart attack and stroke.  ASSESSMENT AND PLAN:  Vitamin D deficiency - Plan: Vitamin D, Ergocalciferol, (DRISDOL) 1.25 MG (50000 UT) CAPS capsule  Essential hypertension  At risk for osteoporosis  Class 2 severe obesity with serious comorbidity and body mass index (BMI) of 36.0 to 36.9 in adult, unspecified obesity type ([place])  PLAN:  Vitamin D Deficiency [name] was informed that low vitamin D levels contributes to fatigue and are associated with obesity, breast, and colon cancer. [name] agrees to continue taking prescription Vit D @50 ,000 IU every week #4 and  we will refill for 1 month. She will follow up for routine testing of vitamin D, at least 2-3 times per year. She was informed of the risk of over-replacement of vitamin D and agrees to not increase her dose unless she discusses this with [place] first. [name] agrees to follow up with our clinic in 2 weeks.  At risk for osteopenia and osteoporosis [name] was given extended (15 minutes) osteoporosis prevention counseling today. [name] is at risk for osteopenia and osteoporsis due to her vitamin D deficiency. She was encouraged to take her vitamin D and follow her higher calcium diet and increase strengthening exercise to help strengthen her bones and decrease her risk of osteopenia and osteoporosis.  Hypertension We discussed sodium restriction, working on healthy weight loss, and a regular exercise program as the means to achieve improved blood pressure control. [name] agreed with this plan and agreed to follow up as directed. We will continue to monitor her blood pressure as well as her progress with the above lifestyle modifications. [name] agrees to continue taking lisinopril, no refill needed, and will watch for signs of hypotension as she continues her lifestyle modifications. [name] agrees to follow up with our clinic in 2 weeks.  Obesity [name] is currently in the action stage of change. As such, her goal is to continue with weight loss efforts She has agreed to keep a food journal with 1300-1400 calories and 75+ grams of protein daily [name] is to plan to restart journaling 4 out of 7 days, with the expectancy  to journal partial days for the other 3 days and only allow 2 indulgences per week. [name] has been instructed to work up to a goal of 150 minutes of combined cardio and strengthening exercise per week for weight loss and overall health benefits. We discussed the following Behavioral Modification Strategies today: increasing lean protein intake, increasing vegetables, work on meal planning and easy  cooking plans, better snacking choices, and planning for success   [name] has agreed to follow up with our clinic in 2 weeks. She was informed of the importance of frequent follow up visits to maximize her success with intensive lifestyle modifications for her multiple health conditions.  ALLERGIES: Allergies  Allergen Reactions  . Betadine [Povidone Iodine] Other (See Comments)    Dx with allergy testing  . Cymbalta [Duloxetine Hcl] Other (See Comments)    Makes her feel crazy, out of body  . Iodine   . Shellfish Allergy Other (See Comments)    Dx with allergy testing    MEDICATIONS: Current Outpatient Medications on File Prior to Visit  Medication Sig Dispense Refill  . estradiol (VIVELLE-DOT) 0.05 MG/24HR Place 1 patch onto the skin 2 (two) times a week. Monday and Thursday    . lisinopril (PRINIVIL,ZESTRIL) 10 MG tablet TAKE 1 TABLET BY MOUTH  DAILY 90 tablet 1  . Multiple Vitamins-Minerals (MULTIVITAMIN ADULT PO) Take by mouth.     No current facility-administered medications on file prior to visit.     PAST MEDICAL HISTORY: Past Medical History:  Diagnosis Date  . Acute mastitis of left breast 10/04/2007  . AMA (advanced maternal age) multigravida 52+   . Cancer ([place]) 2002   Left leg  . Celiac disease   . Chronic back pain    H/O  . Complication of anesthesia    Difficult time waking up  . Constipation in pregnancy 03/2006  . Endometriosis    h/o  . Environmental allergies   . Fibroid    During first pregnancy  . Fibromyalgia   . First trimester bleeding 2008  . GBS carrier   . GERD (gastroesophageal reflux disease)   . Granuloma annulare   . H/O amenorrhea 10/2006  . H/O blood clots    7 yr ago in arm  . H/O varicella    As an infant.  . Headache(784.0)   . Hearing loss 08/15/2016  . Heart murmur   . HTN (hypertension)   . Hyperlipidemia 03/02/2017  . IBS (irritable bowel syndrome) 08/15/2016  . Irregular uterine bleeding   . Joint pain   . Lactose  intolerance   . Leg edema   . Leg pain, left 02/09/2016  . Monilial vaginitis 07/27/04  . Palpitations   . Rh negative status during pregnancy   . SAB (spontaneous abortion)    h/o x 2  . Seasonal allergies   . Sleep apnea 08/15/2016  . TMJ syndrome   . Vegetarianism 08/15/2016  . Vitamin D deficiency 08/16/2015    PAST SURGICAL HISTORY: Past Surgical History:  Procedure Laterality Date  . ABDOMINAL HYSTERECTOMY  2013  . APPENDECTOMY  2002  . DILATION AND EVACUATION    . endocervical polyp removal  2001  . ENDOMETRIAL ABLATION  2010  . HERNIA REPAIR  05/16/12   incisional hernia repair  . HYSTEROSCOPY W/ ENDOMETRIAL ABLATION  01/28/2009  . INSERTION OF MESH N/A 05/16/2012   Procedure: INSERTION OF MESH;  Surgeon: Joyice Faster. Cornett, MD;  Location: [facility];  Service: General;  Laterality: N/A;  .  KNEE ARTHROSCOPY Left 12/05/2012   Procedure: LEFT KNEE ARTHROSCOPY;  Surgeon: Kerin Salen, MD;  Location: [facility];  Service: Orthopedics;  Laterality: Left;  [name] MELANOMA EXCISION  2002   Left Leg  . melanoma removal   2003  . SALPINGOOPHORECTOMY  12/08/2011   Procedure: SALPINGO OOPHERECTOMY;  Surgeon: Eldred Manges, MD;  Location: [facility];  Service: Gynecology;  Laterality: Bilateral;  . TENDON RELEASE  2004  . uterine curettage  01/2009  . VENTRAL HERNIA REPAIR N/A 05/16/2012   Procedure: LAPAROSCOPIC VENTRAL HERNIA;  Surgeon: Joyice Faster. Cornett, MD;  Location: [facility];  Service: General;  Laterality: N/A;  . WISDOM TOOTH EXTRACTION  1989    SOCIAL HISTORY: Social History   Tobacco Use  . Smoking status: Former Smoker    Types: Cigarettes    Last attempt to quit: 02/29/1988    Years since quitting: 30.1  . Smokeless tobacco: Never Used  Substance Use Topics  . Alcohol use: No  . Drug use: No    FAMILY HISTORY: Family History  Problem Relation Age of Onset  . Hypertension Mother   . Cancer Mother        breast  . Heart disease Mother   . Anxiety disorder  Mother   . Heart disease Father        atrial fib  . Hypertension Father   . Cancer Father        Lyphoma,Bladder  . Atrial fibrillation Father   . Obesity Father   . Cancer Maternal Grandmother        breast  . Heart disease Maternal Grandfather        MI  . Heart disease Paternal Grandfather        MI  . Cancer Maternal Aunt        ovarian  . Diverticulitis Brother     ROS: Review of Systems  Constitutional: Positive for malaise/fatigue. Negative for weight loss.  Cardiovascular: Negative for chest pain.       Negative chest pressure  Gastrointestinal: Negative for nausea and vomiting.  Musculoskeletal:       Negative muscle weakness  Neurological: Negative for headaches.    PHYSICAL EXAM: Blood pressure 132/85, pulse 67, temperature 97.9 F (36.6 C), temperature source Oral, height 5\' 4"  (1.626 m), weight 213 lb (96.6 kg), last menstrual period 02/22/2011, SpO2 97 %. Body mass index is 36.56 kg/m. Physical Exam Vitals signs reviewed.  Constitutional:      Appearance: Normal appearance. She is obese.  Cardiovascular:     Rate and Rhythm: Normal rate.     Pulses: Normal pulses.  Pulmonary:     Effort: Pulmonary effort is normal.     Breath sounds: Normal breath sounds.  Musculoskeletal: Normal range of motion.  Skin:    General: Skin is warm and dry.  Neurological:     Mental Status: She is alert and oriented to person, place, and time.  Psychiatric:        Mood and Affect: Mood normal.        Behavior: Behavior normal.     RECENT LABS AND TESTS: BMET    Component Value Date/Time   NA 142 11/20/2017 0944   K 3.5 11/20/2017 0944   CL 99 11/20/2017 0944   CO2 26 11/20/2017 0944   GLUCOSE 100 (H) 11/20/2017 0944   GLUCOSE 105 (H) 03/02/2017 0844   BUN 11 11/20/2017 0944   CREATININE 0.56 (L) 11/20/2017 0944   CALCIUM 10.0  11/20/2017 0944   GFRNONAA 111 11/20/2017 0944   GFRAA 128 11/20/2017 0944   Lab Results  Component Value Date   HGBA1C 5.2  06/20/2017   Lab Results  Component Value Date   INSULIN 10.7 06/20/2017   CBC    Component Value Date/Time   WBC 8.7 06/20/2017 1155   WBC 7.0 03/02/2017 0844   RBC 4.77 06/20/2017 1155   RBC 5.10 03/02/2017 0844   HGB 14.2 06/20/2017 1155   HCT 41.8 06/20/2017 1155   PLT 364.0 03/02/2017 0844   MCV 88 06/20/2017 1155   MCH 29.8 06/20/2017 1155   MCH 29.0 05/17/2012 0640   MCHC 34.0 06/20/2017 1155   MCHC 33.4 03/02/2017 0844   RDW 13.7 06/20/2017 1155   LYMPHSABS 2.5 06/20/2017 1155   MONOABS 0.5 02/02/2016 0926   EOSABS 0.4 06/20/2017 1155   BASOSABS 0.0 06/20/2017 1155   Iron/TIBC/Ferritin/ %Sat No results found for: IRON, TIBC, FERRITIN, IRONPCTSAT Lipid Panel     Component Value Date/Time   CHOL 143 11/20/2017 0944   TRIG 92 11/20/2017 0944   HDL 42 11/20/2017 0944   CHOLHDL 4 03/02/2017 0844   VLDL 21.2 03/02/2017 0844   LDLCALC 83 11/20/2017 0944   Hepatic Function Panel     Component Value Date/Time   PROT 6.6 11/20/2017 0944   ALBUMIN 4.6 11/20/2017 0944   AST 17 11/20/2017 0944   ALT 19 11/20/2017 0944   ALKPHOS 67 11/20/2017 0944   BILITOT 0.4 11/20/2017 0944   BILIDIR 0.1 01/28/2015 0946      Component Value Date/Time   TSH 1.440 11/20/2017 0944   TSH 1.050 06/20/2017 1155   TSH 1.79 03/02/2017 0844      OBESITY BEHAVIORAL INTERVENTION VISIT  Today's visit was # 12   Starting weight: 204 lbs Starting date: 06/20/17 Today's weight : 213 lbs  Today's date: 04/24/2018 Total lbs lost to date: 0    04/24/2018  Height 5\' 4"  (1.626 m)  Weight 213 lb (96.6 kg)  BMI (Calculated) 36.54  BLOOD PRESSURE - SYSTOLIC 353  BLOOD PRESSURE - DIASTOLIC 85   Body Fat % 29.9 %  Total Body Water (lbs) 79.8 lbs     ASK: We discussed the diagnosis of obesity with [name] today and [name] agreed to give [place] permission to discuss obesity behavioral modification therapy today.  ASSESS: Nal[place] has the diagnosis of obesity and her BMI today is  36.54 [name] is in the action stage of change   ADVISE: [name] was educated on the multiple health risks of obesity as well as the benefit of weight loss to improve her health. She was advised of the need for long term treatment and the importance of lifestyle modifications to improve her current health and to decrease her risk of future health problems.  AGREE: Multiple dietary modification options and treatment options were discussed and  [name] agreed to follow the recommendations documented in the above note.  ARRANGE: [name] was educated on the importance of frequent visits to treat obesity as outlined per CMS and USPSTF guidelines and agreed to schedule her next follow up appointment today.  I, Trixie Dredge, am acting as transcriptionist for Ilene Qua, MD  I have reviewed the above documentation for accuracy and completeness, and I agree with the above. - Ilene Qua, MD

## 2018-05-10 ENCOUNTER — Ambulatory Visit (INDEPENDENT_AMBULATORY_CARE_PROVIDER_SITE_OTHER): Payer: 59 | Admitting: Family Medicine

## 2018-05-24 ENCOUNTER — Encounter (INDEPENDENT_AMBULATORY_CARE_PROVIDER_SITE_OTHER): Payer: Self-pay

## 2018-05-29 ENCOUNTER — Ambulatory Visit (INDEPENDENT_AMBULATORY_CARE_PROVIDER_SITE_OTHER): Payer: 59 | Admitting: Family Medicine

## 2018-08-30 ENCOUNTER — Other Ambulatory Visit: Payer: Self-pay | Admitting: Family Medicine

## 2018-09-24 ENCOUNTER — Encounter: Payer: Self-pay | Admitting: Family Medicine

## 2018-10-09 NOTE — Telephone Encounter (Signed)
I have looked and searched in patients chart I do not see where this is. I do remember doing one but do not see it in her chart.

## 2018-10-09 NOTE — Telephone Encounter (Signed)
Relation to pt: self  Call back number: [phone]    Reason for call:  Patient checking on the status of message mentioned below and would like a follow up call, please advise today.

## 2018-10-13 ENCOUNTER — Encounter: Payer: Self-pay | Admitting: Family Medicine

## 2018-10-13 DIAGNOSIS — G4733 Obstructive sleep apnea (adult) (pediatric): Secondary | ICD-10-CM

## 2018-10-15 ENCOUNTER — Other Ambulatory Visit: Payer: Self-pay | Admitting: Family Medicine

## 2018-10-16 MED ORDER — DENTAL GUARD MISC: 1.0000 | 0 refills | Status: AC

## 2018-10-16 NOTE — Telephone Encounter (Signed)
Spoke with patients dentist office Katharine Look) she stated the device is an oral sleep appliance, with a dx: sleep apnea. I have printed the prescription and faxed to [phone].  Patient notified .

## 2018-10-16 NOTE — Addendum Note (Signed)
Addended by: Magdalene Molly A on: 10/16/2018 02:22 PM   Modules accepted: Orders

## 2018-10-16 NOTE — Telephone Encounter (Signed)
The first message was sent on 7/28 with your response and my response. I looked into patients chart and was unable to find a letter or a prescription about what she needs.

## 2018-10-16 NOTE — Telephone Encounter (Signed)
See other mychart message from 09/24/18

## 2019-03-15 ENCOUNTER — Other Ambulatory Visit: Payer: Self-pay | Admitting: Family Medicine

## 2019-05-23 ENCOUNTER — Other Ambulatory Visit: Payer: Self-pay | Admitting: Orthopedic Surgery

## 2019-05-23 DIAGNOSIS — M5442 Lumbago with sciatica, left side: Secondary | ICD-10-CM

## 2019-07-02 ENCOUNTER — Ambulatory Visit
Admission: RE | Admit: 2019-07-02 | Discharge: 2019-07-02 | Disposition: A | Discharge status: Home / Self Care | Payer: 59 | Source: Ambulatory Visit | Attending: Orthopedic Surgery | Admitting: Orthopedic Surgery

## 2019-07-02 DIAGNOSIS — M5442 Lumbago with sciatica, left side: Secondary | ICD-10-CM

## 2019-07-12 ENCOUNTER — Other Ambulatory Visit: Payer: Self-pay

## 2019-07-12 ENCOUNTER — Telehealth (INDEPENDENT_AMBULATORY_CARE_PROVIDER_SITE_OTHER): Payer: 59 | Admitting: Family Medicine

## 2019-07-12 DIAGNOSIS — M79605 Pain in left leg: Secondary | ICD-10-CM

## 2019-07-12 DIAGNOSIS — I10 Essential (primary) hypertension: Secondary | ICD-10-CM | POA: Diagnosis not present

## 2019-07-12 DIAGNOSIS — E559 Vitamin D deficiency, unspecified: Secondary | ICD-10-CM

## 2019-07-12 DIAGNOSIS — M545 Low back pain, unspecified: Secondary | ICD-10-CM

## 2019-07-12 DIAGNOSIS — R739 Hyperglycemia, unspecified: Secondary | ICD-10-CM

## 2019-07-12 DIAGNOSIS — M79662 Pain in left lower leg: Secondary | ICD-10-CM

## 2019-07-12 DIAGNOSIS — M25562 Pain in left knee: Secondary | ICD-10-CM | POA: Diagnosis not present

## 2019-07-12 DIAGNOSIS — M5442 Lumbago with sciatica, left side: Secondary | ICD-10-CM | POA: Diagnosis not present

## 2019-07-12 DIAGNOSIS — E785 Hyperlipidemia, unspecified: Secondary | ICD-10-CM

## 2019-07-12 NOTE — Assessment & Plan Note (Signed)
Monitor and report, no changes to meds. Encouraged heart healthy diet such as the DASH diet and exercise as tolerated.

## 2019-07-12 NOTE — Assessment & Plan Note (Signed)
She had left leg pain back in early March in upper/lateral/posterior calf. She went to [facility] and they decided she needed a arthroscopy of her knee which she did. Now she feels the knee is better but the initial pain in the upper calf is unchanged, she feels it is worse in certain positions. Sitting in certain positions can be painful and getting up and moving around makes it better. Will try Sports Medicine consultation to evaluate her pain and help [place] delineate next steps. She also had a back MRI and they have offered a steroid shot in her low back to address but she is not sure she wants to proceed with that until she rules out other pathology

## 2019-07-14 NOTE — Assessment & Plan Note (Signed)
Supplement and monitor 

## 2019-07-14 NOTE — Assessment & Plan Note (Addendum)
Encouraged moist heat and gentle stretching as tolerated. May try NSAIDs and prescription meds as directed and report if symptoms worsen or seek immediate care. Referred to sports medicine for further evaluation.  She has noted pain down left leg for months. She had surgery on left knee hoping that would help and while the knee is better the pain in her upper outer posterior calf.

## 2019-07-14 NOTE — Progress Notes (Signed)
Virtual Visit via Video Note  I connected with [name] on 07/12/19 at  8:00 AM EDT by a video enabled telemedicine application and verified that I am speaking with the correct person using two identifiers.  Location: Patient: home patient was in the visit Provider: home provider was in visit   I discussed the limitations of evaluation and management by telemedicine and the availability of in person appointments. The patient expressed understanding and agreed to proceed. Kem Boroughs CMA was able to get the patient set up on a visit, video   Subjective:    Patient ID: [name], female    DOB: 11/05/1970, 49 y.o.   MRN: [id number]  No chief complaint on file.   HPI Patient is in today for follow up on chronic medical concerns. No recent febrile illness or hospitalizations. She is struggling with left leg pain. She had surgery on her left knee in March after going to orthopaedics complaining of left upper, lateral calf pain/knee pain. No trauma or deformity no redness or warmth. She gets some relief temporarily with meloxicam but then pain returns. Her knee pain is better since surgery but the other pain persists. She notes currently her pain is positional and is best when up and walking. Worst when sitting in certain positions. It can radiate from thigh thru calf and into ankle but is most prominent in calf. No c/o incontinence. Denies CP/palp/SOB/HA/congestion/fevers/GI or GU c/o. Taking meds as prescribed  Past Medical History:  Diagnosis Date  . Acute mastitis of left breast 10/04/2007  . AMA (advanced maternal age) multigravida 49+   . Cancer ([place]) 2002   Left leg  . Celiac disease   . Chronic back pain    H/O  . Complication of anesthesia    Difficult time waking up  . Constipation in pregnancy 03/2006  . Endometriosis    h/o  . Environmental allergies   . Fibroid    During first pregnancy  . Fibromyalgia   . First trimester bleeding 2008  . GBS carrier     . GERD (gastroesophageal reflux disease)   . Granuloma annulare   . H/O amenorrhea 10/2006  . H/O blood clots    7 yr ago in arm  . H/O varicella    As an infant.  . Headache(784.0)   . Hearing loss 08/15/2016  . Heart murmur   . HTN (hypertension)   . Hyperlipidemia 03/02/2017  . IBS (irritable bowel syndrome) 08/15/2016  . Irregular uterine bleeding   . Joint pain   . Lactose intolerance   . Leg edema   . Leg pain, left 02/09/2016  . Monilial vaginitis 07/27/04  . Palpitations   . Rh negative status during pregnancy   . SAB (spontaneous abortion)    h/o x 2  . Seasonal allergies   . Sleep apnea 08/15/2016  . TMJ syndrome   . Vegetarianism 08/15/2016  . Vitamin D deficiency 08/16/2015    Past Surgical History:  Procedure Laterality Date  . ABDOMINAL HYSTERECTOMY  2013  . APPENDECTOMY  2002  . DILATION AND EVACUATION    . endocervical polyp removal  2001  . ENDOMETRIAL ABLATION  2010  . HERNIA REPAIR  05/16/12   incisional hernia repair  . HYSTEROSCOPY W/ ENDOMETRIAL ABLATION  01/28/2009  . INSERTION OF MESH N/A 05/16/2012   Procedure: INSERTION OF MESH;  Surgeon: Joyice Faster. Cornett, MD;  Location: [place];  Service: General;  Laterality: N/A;  . KNEE ARTHROSCOPY Left 12/05/2012  Procedure: LEFT KNEE ARTHROSCOPY;  Surgeon: Kerin Salen, MD;  Location: [facility];  Service: Orthopedics;  Laterality: Left;  [name] MELANOMA EXCISION  2002   Left Leg  . melanoma removal   2003  . SALPINGOOPHORECTOMY  12/08/2011   Procedure: SALPINGO OOPHERECTOMY;  Surgeon: Eldred Manges, MD;  Location: [facility];  Service: Gynecology;  Laterality: Bilateral;  . TENDON RELEASE  2004  . uterine curettage  01/2009  . VENTRAL HERNIA REPAIR N/A 05/16/2012   Procedure: LAPAROSCOPIC VENTRAL HERNIA;  Surgeon: Joyice Faster. Cornett, MD;  Location: [facility];  Service: General;  Laterality: N/A;  . WISDOM TOOTH EXTRACTION  1989    Family History  Problem Relation Age of Onset  . Hypertension Mother    . Cancer Mother        breast  . Heart disease Mother   . Anxiety disorder Mother   . Heart disease Father        atrial fib  . Hypertension Father   . Cancer Father        Lyphoma,Bladder  . Atrial fibrillation Father   . Obesity Father   . Cancer Maternal Grandmother        breast  . Heart disease Maternal Grandfather        MI  . Heart disease Paternal Grandfather        MI  . Cancer Maternal Aunt        ovarian  . Diverticulitis Brother     Social History   Socioeconomic History  . Marital status: Married    Spouse name: Psychiatric nurse  . Number of children: 3  . Years of education: Not on file  . Highest education level: Not on file  Occupational History  . Occupation: Armed forces operational officer  Tobacco Use  . Smoking status: Former Smoker    Types: Cigarettes    Quit date: 02/29/1988    Years since quitting: 31.3  . Smokeless tobacco: Never Used  Substance and Sexual Activity  . Alcohol use: No  . Drug use: No  . Sexual activity: Yes    Birth control/protection: Surgical    Comment: BTL  Other Topics Concern  . 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 Difficulty of Paying Living Expenses:   Food Insecurity:   . Worried About Charity fundraiser in the Last Year:   . Arboriculturist in the Last Year:   Transportation Needs:   . Film/video editor (Medical):   [name] Lack of Transportation (Non-Medical):   Physical Activity:   . Days of Exercise per Week:   . Minutes of Exercise per Session:   Stress:   . Feeling of Stress :   Social Connections:   . Frequency of Communication with Friends and Family:   . Frequency of Social Gatherings with Friends and Family:   . Attends Religious Services:   . Active Member of Clubs or Organizations:   . Attends Archivist Meetings:   [name] Marital  Status:   Intimate Partner Violence:   . Fear of Current or Ex-Partner:   . Emotionally Abused:   [name] Physically Abused:   . Sexually Abused:     Outpatient Medications Prior to Visit  Medication Sig Dispense Refill  . estradiol (VIVELLE-DOT)  0.05 MG/24HR Place 1 patch onto the skin 2 (two) times a week. Monday and Thursday    . lisinopril (ZESTRIL) 10 MG tablet TAKE 1 TABLET BY MOUTH  DAILY 90 tablet 3  . Misc. Devices (DENTAL GUARD) MISC 1 Device by Does not apply route See admin instructions. 1 each 0  . Multiple Vitamins-Minerals (MULTIVITAMIN ADULT PO) Take by mouth.    . Vitamin D, Ergocalciferol, (DRISDOL) 1.25 MG (50000 UT) CAPS capsule Take 1 capsule (50,000 Units total) by mouth every 7 (seven) days. 4 capsule 0   No facility-administered medications prior to visit.    Allergies  Allergen Reactions  . Betadine [Povidone Iodine] Other (See Comments)    Dx with allergy testing  . Cymbalta [Duloxetine Hcl] Other (See Comments)    Makes her feel crazy, out of body  . Iodine   . Shellfish Allergy Other (See Comments)    Dx with allergy testing    Review of Systems  Constitutional: Negative for fever and malaise/fatigue.  HENT: Negative for congestion.   Eyes: Negative for blurred vision.  Respiratory: Negative for shortness of breath.   Cardiovascular: Negative for chest pain, palpitations and leg swelling.  Gastrointestinal: Negative for abdominal pain, blood in stool and nausea.  Genitourinary: Negative for dysuria and frequency.  Musculoskeletal: Positive for back pain, joint pain and myalgias. Negative for falls.  Skin: Negative for rash.  Neurological: Negative for dizziness, loss of consciousness and headaches.  Endo/Heme/Allergies: Negative for environmental allergies.  Psychiatric/Behavioral: Negative for depression. The patient is not nervous/anxious.        Objective:    Physical Exam Constitutional:      Appearance: Normal appearance. She is not  ill-appearing.  HENT:     Head: Normocephalic and atraumatic.     Right Ear: External ear normal.     Left Ear: External ear normal.     Nose: Nose normal.  Eyes:     General:        Right eye: No discharge.        Left eye: No discharge.  Pulmonary:     Effort: Pulmonary effort is normal.  Neurological:     Mental Status: She is alert and oriented to person, place, and time.  Psychiatric:        Mood and Affect: Mood normal.        Behavior: Behavior normal.     LMP 02/22/2011  Wt Readings from Last 3 Encounters:  04/24/18 213 lb (96.6 kg)  03/09/18 213 lb 12.8 oz (97 kg)  01/11/18 200 lb (90.7 kg)    Diabetic Foot Exam - Simple   No data filed     Lab Results  Component Value Date   WBC 8.7 06/20/2017   HGB 14.2 06/20/2017   HCT 41.8 06/20/2017   PLT 364.0 03/02/2017   GLUCOSE 100 (H) 11/20/2017   CHOL 143 11/20/2017   TRIG 92 11/20/2017   HDL 42 11/20/2017   LDLCALC 83 11/20/2017   ALT 19 11/20/2017   AST 17 11/20/2017   NA 142 11/20/2017   K 3.5 11/20/2017   CL 99 11/20/2017   CREATININE 0.56 (L) 11/20/2017   BUN 11 11/20/2017   CO2 26 11/20/2017   TSH 1.440 11/20/2017   HGBA1C 5.2 06/20/2017    Lab Results  Component Value Date   TSH 1.440 11/20/2017   Lab Results  Component Value Date   WBC 8.7 06/20/2017   HGB 14.2 06/20/2017   HCT 41.8 06/20/2017  MCV 88 06/20/2017   PLT 364.0 03/02/2017   Lab Results  Component Value Date   NA 142 11/20/2017   K 3.5 11/20/2017   CO2 26 11/20/2017   GLUCOSE 100 (H) 11/20/2017   BUN 11 11/20/2017   CREATININE 0.56 (L) 11/20/2017   BILITOT 0.4 11/20/2017   ALKPHOS 67 11/20/2017   AST 17 11/20/2017   ALT 19 11/20/2017   PROT 6.6 11/20/2017   ALBUMIN 4.6 11/20/2017   CALCIUM 10.0 11/20/2017   GFR 113.32 03/02/2017   Lab Results  Component Value Date   CHOL 143 11/20/2017   Lab Results  Component Value Date   HDL 42 11/20/2017   Lab Results  Component Value Date   LDLCALC 83 11/20/2017     Lab Results  Component Value Date   TRIG 92 11/20/2017   Lab Results  Component Value Date   CHOLHDL 4 03/02/2017   Lab Results  Component Value Date   HGBA1C 5.2 06/20/2017       Assessment & Plan:   Problem List Items Addressed This Visit    Essential hypertension    Monitor and report, no changes to meds. Encouraged heart healthy diet such as the DASH diet and exercise as tolerated.       Relevant Orders   Ambulatory referral to Sports Medicine   VITAMIN D 25 Hydroxy (Vit-D Deficiency, Fractures)   Low back pain    Encouraged moist heat and gentle stretching as tolerated. May try NSAIDs and prescription meds as directed and report if symptoms worsen or seek immediate care. Referred to sports medicine for further evaluation.  She has noted pain down left leg for months. She had surgery on left knee hoping that would help and while the knee is better the pain in her upper outer posterior calf.       Relevant Orders   Ambulatory referral to Sports Medicine   CBC   Comprehensive metabolic panel   TSH   Back pain - Primary   Relevant Orders   Ambulatory referral to Sports Medicine   CBC   Comprehensive metabolic panel   TSH   Vitamin D deficiency    Supplement and monitor      Leg pain, left    She had left leg pain back in early March in upper/lateral/posterior calf. She went to [facility] and they decided she needed a arthroscopy of her knee which she did. Now she feels the knee is better but the initial pain in the upper calf is unchanged, she feels it is worse in certain positions. Sitting in certain positions can be painful and getting up and moving around makes it better. Will try Sports Medicine consultation to evaluate her pain and help [place] delineate next steps. She also had a back MRI and they have offered a steroid shot in her low back to address but she is not sure she wants to proceed with that until she rules out other pathology      Hyperlipidemia    Relevant Orders   Lipid panel    Other Visit Diagnoses    Pain of left calf       Relevant Orders   Ambulatory referral to Sports Medicine   Left knee pain, unspecified chronicity       Relevant Orders   Ambulatory referral to Sports Medicine   Hyperglycemia       Relevant Orders   Hemoglobin A1c      I am having [name] A. Drue Second  maintain her estradiol, Multiple Vitamins-Minerals (MULTIVITAMIN ADULT PO), Vitamin D (Ergocalciferol), Dental Guard, and lisinopril.  No orders of the defined types were placed in this encounter.    I discussed the assessment and treatment plan with the patient. The patient was provided an opportunity to ask questions and all were answered. The patient agreed with the plan and demonstrated an understanding of the instructions.   The patient was advised to call back or seek an in-person evaluation if the symptoms worsen or if the condition fails to improve as anticipated.  I provided 20 minutes of non-face-to-face time during this encounter.   Penni Homans, MD

## 2019-07-18 ENCOUNTER — Other Ambulatory Visit (INDEPENDENT_AMBULATORY_CARE_PROVIDER_SITE_OTHER): Payer: 59

## 2019-07-18 ENCOUNTER — Other Ambulatory Visit: Payer: Self-pay

## 2019-07-18 DIAGNOSIS — R739 Hyperglycemia, unspecified: Secondary | ICD-10-CM | POA: Diagnosis not present

## 2019-07-18 DIAGNOSIS — I10 Essential (primary) hypertension: Secondary | ICD-10-CM | POA: Diagnosis not present

## 2019-07-18 DIAGNOSIS — M5442 Lumbago with sciatica, left side: Secondary | ICD-10-CM

## 2019-07-18 DIAGNOSIS — E785 Hyperlipidemia, unspecified: Secondary | ICD-10-CM | POA: Diagnosis not present

## 2019-07-18 DIAGNOSIS — E559 Vitamin D deficiency, unspecified: Secondary | ICD-10-CM

## 2019-07-18 LAB — COMPREHENSIVE METABOLIC PANEL
ALT: 27 U/L (ref 0–35)
AST: 18 U/L (ref 0–37)
Albumin: 4.5 g/dL (ref 3.5–5.2)
Alkaline Phosphatase: 63 U/L (ref 39–117)
BUN: 12 mg/dL (ref 6–23)
CO2: 30 mEq/L (ref 19–32)
Calcium: 10 mg/dL (ref 8.4–10.5)
Chloride: 104 mEq/L (ref 96–112)
Creatinine, Ser: 0.57 mg/dL (ref 0.40–1.20)
GFR: 112.02 mL/min (ref >60.00)
Glucose, Bld: 108 mg/dL — ABNORMAL HIGH (ref 70–99)
Potassium: 4.8 mEq/L (ref 3.5–5.1)
Sodium: 140 mEq/L (ref 135–145)
Total Bilirubin: 0.5 mg/dL (ref 0.2–1.2)
Total Protein: 6.5 g/dL (ref 6.0–8.3)

## 2019-07-18 LAB — CBC
HCT: 41.6 % (ref 36.0–46.0)
Hemoglobin: 14.3 g/dL (ref 12.0–15.0)
MCHC: 34.4 g/dL (ref 30.0–36.0)
MCV: 86.9 fl (ref 78.0–100.0)
Platelets: 323 10*3/uL (ref 150.0–400.0)
RBC: 4.79 Mil/uL (ref 3.87–5.11)
RDW: 13.7 % (ref 11.5–15.5)
WBC: 6.4 10*3/uL (ref 4.0–10.5)

## 2019-07-18 LAB — HEMOGLOBIN A1C: Hgb A1c MFr Bld: 5.3 % (ref 4.6–6.5)

## 2019-07-18 LAB — LIPID PANEL
Cholesterol: 161 mg/dL (ref 0–200)
HDL: 44.9 mg/dL (ref >39.00)
LDL Cholesterol: 89 mg/dL (ref 0–99)
NonHDL: 115.87
Total CHOL/HDL Ratio: 4
Triglycerides: 136 mg/dL (ref 0.0–149.0)
VLDL: 27.2 mg/dL (ref 0.0–40.0)

## 2019-07-18 LAB — VITAMIN D 25 HYDROXY (VIT D DEFICIENCY, FRACTURES): VITD: 23.49 ng/mL — ABNORMAL LOW (ref 30.00–100.00)

## 2019-07-18 LAB — TSH: TSH: 1.97 u[IU]/mL (ref 0.35–4.50)

## 2019-07-23 ENCOUNTER — Ambulatory Visit: Payer: 59 | Admitting: Family Medicine

## 2019-07-23 ENCOUNTER — Other Ambulatory Visit: Payer: Self-pay

## 2019-07-23 ENCOUNTER — Encounter: Payer: Self-pay | Admitting: Family Medicine

## 2019-07-23 VITALS — BP 120/78 | HR 81 | Ht 64.0 in | Wt 230.0 lb

## 2019-07-23 DIAGNOSIS — M5417 Radiculopathy, lumbosacral region: Secondary | ICD-10-CM | POA: Diagnosis not present

## 2019-07-23 DIAGNOSIS — M79605 Pain in left leg: Secondary | ICD-10-CM | POA: Diagnosis not present

## 2019-07-23 MED ORDER — VITAMIN D (ERGOCALCIFEROL) 1.25 MG (50000 UNIT) PO CAPS: 50000.0000 [IU] | ORAL_CAPSULE | ORAL | 0 refills | Status: DC

## 2019-07-23 MED ORDER — GABAPENTIN 300 MG PO CAPS
300.0000 mg | ORAL_CAPSULE | Freq: Three times a day (TID) | ORAL | 3 refills | Status: DC | PRN
Start: 2019-07-23 — End: 2019-11-21

## 2019-07-23 NOTE — Progress Notes (Signed)
Subjective:    I'm seeing this patient as a consultation for:  Dr. Charlett Blake. Note will be routed back to referring provider/PCP.  CC: L calf / lower leg pain  I, Molly Weber, LAT, ATC, am serving as scribe for Dr. Lynne Leader.  HPI: Pt is a 49 y/o female presenting w/ c/o L calf and lower leg pain for years.  She initially had c/o of L knee and calf/lower leg pain and had L knee surgery in March 2021.  She reports that her L knee pain has improved since surgery but con't to have pain in her L calf.  She completed 11 PT sessions for her L knee and did some PT for her low back too.  Radiating pain: Yes into L lower leg to the ankle and occasionally to the L thigh Low back pain: yes L LE numbness/tingling: Yes L LE weakness: Unknown Aggravating factors: at night when trying to go to sleep; w/ legs elevated as in a recliner; riding in the car Treatments tried: PT for her lower back; Meloxicam  Diagnostic testing: L-spine MRI- 07/02/19  Past medical history, Surgical history, Family history, Social history, Allergies, and medications have been entered into the medical record, reviewed.   Review of Systems: No new headache, visual changes, nausea, vomiting, diarrhea, constipation, dizziness, abdominal pain, skin rash, fevers, chills, night sweats, weight loss, swollen lymph nodes, body aches, joint swelling, muscle aches, chest pain, shortness of breath, mood changes, visual or auditory hallucinations.   Objective:    Vitals:   07/23/19 0840  BP: 120/78  Pulse: 81  SpO2: 96%   General: Well Developed, well nourished, and in no acute distress.  Neuro/Psych: Alert and oriented x3, extra-ocular muscles intact, able to move all 4 extremities, sensation grossly intact. Skin: Warm and dry, no rashes noted.  Respiratory: Not using accessory muscles, speaking in full sentences, trachea midline.  Cardiovascular: Pulses palpable, no extremity edema. Abdomen: Does not appear distended. MSK: Left  leg largely normal-appearing with no palpable cords or erythema.  Calf diameters appear to be equal. Knee mildly tender palpation at lateral knee at fibular head. Normal knee motion. L-spine normal-appearing nontender decreased lumbar motion. Lower extremity strength reflexes and sensation are intact. Mildly positive left-sided slump test.  Lab and Radiology Results MR LUMBAR SPINE WO CONTRAST  Result Date: 07/02/2019 CLINICAL DATA:  Chronic left leg pain and numbness. No prior surgery. EXAM: MRI LUMBAR SPINE WITHOUT CONTRAST TECHNIQUE: Multiplanar, multisequence MR imaging of the lumbar spine was performed. No intravenous contrast was administered. COMPARISON:  CT abdomen pelvis dated April 09, 2012. FINDINGS: Segmentation:  Standard. Alignment: New trace retrolisthesis at L2-L3. Unchanged trace retrolisthesis at L3-L4. Vertebrae:  No fracture, evidence of discitis, or bone lesion. Conus medullaris and cauda equina: Conus extends to the L1 level. Conus and cauda equina appear normal. Paraspinal and other soft tissues: Negative. Disc levels: T11-T12: Negative. T12-L1:  Negative. L1-L2:  Minimal disc bulging. No stenosis. L2-L3:  Mild disc bulging. No stenosis. L3-L4:  Mild disc bulging. No stenosis. L4-L5:  Negative. L5-S1: Chronic severe disc height loss and mild disc bulging. Unchanged mild bilateral facet arthropathy. Unchanged mild bilateral neuroforaminal stenosis. No spinal canal stenosis. IMPRESSION: 1. Multilevel lumbar spondylosis, greatest at L5-S1. No high-grade stenosis or impingement. Electronically Signed   By: Titus Dubin M.D.   On: 07/02/2019 16:05   I, Lynne Leader, personally (independently) visualized and performed the interpretation of the images attached in this note.  My personal interpretation is concerning for  neuroforaminal stenosis L4-5 worse on left  Impression and Recommendations:    Assessment and Plan: 49 y.o. female with left lower leg and calf pain.  Failing to  improve following knee surgery.  Existing MRI does not have an obvious smoking done because of pain but in my interpretation is concerning for neuroforaminal stenosis at L4-5 L5-S1.  Distribution of pain down her calf is either L5 or S1 related.  Discussed options.  Plan for trial of gabapentin and epidural steroid injection.  Additionally will arrange for vascular ultrasound to rule out DVT which is also a possibility.  Patient will see me an update will proceed for further testing if needed.  Could consider nerve conduction study or diagnostic ultrasound at the fibular head to look for common peroneal nerve entrapment..  Differential diagnosis at this point is quite broad with uncertain prognosis.  Moderate to severe complexity.  Orders Placed This Encounter  Procedures  . DG INJECT DIAG/THERA/INC NEEDLE/CATH/PLC EPI/LUMB/SAC W/IMG    Standing Status:   Future    Standing Expiration Date:   07/22/2020    Order Specific Question:   Reason for Exam (SYMPTOM  OR DIAGNOSIS REQUIRED)    Answer:   left L4-L5 level for suspected radiculopathy. Technique per radiology    Order Specific Question:   Is the patient pregnant?    Answer:   No    Order Specific Question:   Preferred Imaging Location?    Answer:   [id number]    Order Specific Question:   Radiology Contrast Protocol - do NOT remove file path    Answer:   [email or web address]   Meds ordered this encounter  Medications  . gabapentin (NEURONTIN) 300 MG capsule    Sig: Take 1 capsule (300 mg total) by mouth 3 (three) times daily as needed (nerve pain).    Dispense:  90 capsule    Refill:  3    Discussed warning signs or symptoms. Please see discharge instructions. Patient expresses understanding.   The above documentation has been reviewed and is accurate and complete Lynne Leader, M.D.

## 2019-07-23 NOTE — Addendum Note (Signed)
Addended byDamita Dunnings D on: 07/23/2019 10:30 AM   Modules accepted: Orders

## 2019-07-23 NOTE — Patient Instructions (Signed)
Thank you for coming in today.  Call [facility] imaging to schedule the back injection [phone]  Should should hear about DVT ultrasound soon.   Try gabapentin up to 3x daily for nerve pain. Take mostly at bedtime.   If you cannot sleep after the back injection let me know and I will send in Seroquel or sleeping medicine.   Epidural Steroid Injection Patient Information  Description: The epidural space surrounds the nerves as they exit the spinal cord.  In some patients, the nerves can be compressed and inflamed by a bulging disc or a tight spinal canal (spinal stenosis).  By injecting steroids into the epidural space, we can bring irritated nerves into direct contact with a potentially helpful medication.  These steroids act directly on the irritated nerves and can reduce swelling and inflammation which often leads to decreased pain.  Epidural steroids may be injected anywhere along the spine and from the neck to the low back depending upon the location of your pain.   After numbing the skin with local anesthetic (like Novocaine), a small needle is passed into the epidural space slowly.  You may experience a sensation of pressure while this is being done.  The entire block usually last less than 10 minutes.  Conditions which may be treated by epidural steroids:   Low back and leg pain  Neck and arm pain  Spinal stenosis  Post-laminectomy syndrome  Herpes zoster (shingles) pain  Pain from compression fractures  Preparation for the injection:  1. Do not eat any solid food or dairy products within 8 hours of your appointment.  2. You may drink clear liquids up to 3 hours before appointment.  Clear liquids include water, black coffee, juice or soda.  No milk or cream please. 3. You may take your regular medication, including pain medications, with a sip of water before your appointment  Diabetics should hold regular insulin (if taken separately) and take 1/2 normal NPH dos the  morning of the procedure.  Carry some sugar containing items with you to your appointment. 4. A driver must accompany you and be prepared to drive you home after your procedure.  5. Bring all your current medications with your. 6. An IV may be inserted and sedation may be given at the discretion of the physician.   7. A blood pressure cuff, EKG and other monitors will often be applied during the procedure.  Some patients may need to have extra oxygen administered for a short period. 8. You will be asked to provide medical information, including your allergies, prior to the procedure.  We must know immediately if you are taking blood thinners (like Coumadin/Warfarin)  Or if you are allergic to IV iodine contrast (dye). We must know if you could possible be pregnant.  Possible side-effects:  Bleeding from needle site  Infection (rare, may require surgery)  Nerve injury (rare)  Numbness & tingling (temporary)  Difficulty urinating (rare, temporary)  Spinal headache ( a headache worse with upright posture)  Light -headedness (temporary)  Pain at injection site (several days)  Decreased blood pressure (temporary)  Weakness in arm/leg (temporary)  Pressure sensation in back/neck (temporary)  Call if you experience:  Fever/chills associated with headache or increased back/neck pain.  Headache worsened by an upright position.  New onset weakness or numbness of an extremity below the injection site  Hives or difficulty breathing (go to the emergency room)  Inflammation or drainage at the infection site  Severe back/neck pain  Any new  symptoms which are concerning to you  Please note:  Although the local anesthetic injected can often make your back or neck feel good for several hours after the injection, the pain will likely return.  It takes 3-7 days for steroids to work in the epidural space.  You may not notice any pain relief for at least that one week.  If effective, we  will often do a series of three injections spaced 3-6 weeks apart to maximally decrease your pain.  After the initial series, we generally will wait several months before considering a repeat injection of the same type.

## 2019-07-24 ENCOUNTER — Telehealth: Payer: Self-pay | Admitting: Family Medicine

## 2019-07-24 NOTE — Telephone Encounter (Signed)
The patient called back and did inform of lab results from 07/18/19. She verbalized understanding She had no other questions or concerns.  . Labs look good except vitamin D is down. Labs reveal deficiency. Start on Vitamin D 50000 IU caps, 1 cap po weekly x 12 weeks. Disp #4 with 4 rf. Also take daily Vitamin D over the counter. If already taking a daily supplement increase by 1000 IU daily and if not start Vitamin D 2000 IU daily. Recheck labs in 3-4 monthsd no further questions or concern.

## 2019-07-25 ENCOUNTER — Encounter (HOSPITAL_COMMUNITY): Payer: 59

## 2019-07-31 ENCOUNTER — Ambulatory Visit (HOSPITAL_COMMUNITY)
Admission: RE | Admit: 2019-07-31 | Discharge: 2019-07-31 | Disposition: A | Discharge status: Home / Self Care | Payer: 59 | Source: Ambulatory Visit | Attending: Cardiology | Admitting: Cardiology

## 2019-07-31 ENCOUNTER — Other Ambulatory Visit: Payer: Self-pay

## 2019-07-31 DIAGNOSIS — M79605 Pain in left leg: Secondary | ICD-10-CM | POA: Diagnosis present

## 2019-08-01 NOTE — Progress Notes (Signed)
No evidence of DVT.

## 2019-08-02 ENCOUNTER — Encounter: Payer: Self-pay | Admitting: Family Medicine

## 2019-08-02 NOTE — Progress Notes (Signed)
No DVT seen.  Evidence of meniscus injury present on ultrasound but no DVT seen.

## 2019-08-14 ENCOUNTER — Other Ambulatory Visit: Payer: Self-pay

## 2019-08-14 ENCOUNTER — Ambulatory Visit
Admission: RE | Admit: 2019-08-14 | Discharge: 2019-08-14 | Disposition: A | Discharge status: Home / Self Care | Payer: 59 | Source: Ambulatory Visit | Attending: Family Medicine | Admitting: Family Medicine

## 2019-08-14 DIAGNOSIS — M5417 Radiculopathy, lumbosacral region: Secondary | ICD-10-CM

## 2019-08-14 DIAGNOSIS — M79605 Pain in left leg: Secondary | ICD-10-CM

## 2019-08-14 MED ORDER — METHYLPREDNISOLONE ACETATE 40 MG/ML INJ SUSP (RADIOLOG
120.0000 mg | Freq: Once | INTRAMUSCULAR | Status: AC
  Administered 2019-08-14: 120 mg via EPIDURAL

## 2019-08-14 MED ORDER — IOPAMIDOL (ISOVUE-M 200) INJECTION 41%
1.0000 mL | Freq: Once | INTRAMUSCULAR | Status: AC
  Administered 2019-08-14: 1 mL via EPIDURAL

## 2019-08-14 NOTE — Discharge Instructions (Signed)

## 2019-08-21 ENCOUNTER — Ambulatory Visit: Payer: 59 | Admitting: Family Medicine

## 2019-08-30 ENCOUNTER — Ambulatory Visit (INDEPENDENT_AMBULATORY_CARE_PROVIDER_SITE_OTHER): Payer: 59 | Admitting: Family Medicine

## 2019-08-30 DIAGNOSIS — Z5329 Procedure and treatment not carried out because of patient's decision for other reasons: Secondary | ICD-10-CM

## 2019-08-30 NOTE — Progress Notes (Signed)
No show

## 2019-09-06 ENCOUNTER — Ambulatory Visit: Payer: Self-pay

## 2019-09-06 ENCOUNTER — Ambulatory Visit (INDEPENDENT_AMBULATORY_CARE_PROVIDER_SITE_OTHER): Payer: 59 | Admitting: Family Medicine

## 2019-09-06 ENCOUNTER — Encounter: Payer: Self-pay | Admitting: Family Medicine

## 2019-09-06 ENCOUNTER — Other Ambulatory Visit: Payer: Self-pay

## 2019-09-06 VITALS — BP 122/76 | HR 87 | Ht 64.0 in | Wt 231.0 lb

## 2019-09-06 DIAGNOSIS — G8929 Other chronic pain: Secondary | ICD-10-CM | POA: Diagnosis not present

## 2019-09-06 DIAGNOSIS — M5417 Radiculopathy, lumbosacral region: Secondary | ICD-10-CM

## 2019-09-06 DIAGNOSIS — M25562 Pain in left knee: Secondary | ICD-10-CM | POA: Diagnosis not present

## 2019-09-06 DIAGNOSIS — M25561 Pain in right knee: Secondary | ICD-10-CM

## 2019-09-06 NOTE — Progress Notes (Signed)
Fontaine No, am serving as a scribe for Dr. Lynne Leader This visit occurred during the SARS-CoV-2 public health emergency.  Safety protocols were in place, including screening questions prior to the visit, additional usage of staff PPE, and extensive cleaning of exam room while observing appropriate contact time as indicated for disinfecting solutions.   07/23/2019 49 y.o. female with left lower leg and calf pain.  Failing to improve following knee surgery.  Existing MRI does not have an obvious smoking done because of pain but in my interpretation is concerning for neuroforaminal stenosis at L4-5 L5-S1.  Distribution of pain down her calf is either L5 or S1 related.  Discussed options.  Plan for trial of gabapentin and epidural steroid injection.  Additionally will arrange for vascular ultrasound to rule out DVT which is also a possibility.  Patient will see me an update will proceed for further testing if needed.  Could consider nerve conduction study or diagnostic ultrasound at the fibular head to look for common peroneal nerve entrapment..  Differential diagnosis at this point is quite broad with uncertain prognosis.  Moderate to severe complexity.   [place] 09/06/2019 [name] is a 49 y.o. female who presents to [facility] today for back pain and left leg pain. Patient states that she did have relief from the epidural injection on 08/14/2019. Pain was relieved for one week. Also had decrease in pain in the knees. Movement increases pain in left leg and knee.   She notes her knee pain has returned recently as well. Pain is located mostly in the left knee but is felt bilaterally as well. She is having chronic pain and crepitation with motion. She has difficulty kneeling and climbing stairs.   Pertinent review of systems: No fevers or chills  Relevant historical information: Hypertension, celiac disease   Exam:  BP 122/76   Pulse 87   Ht 5\' 4"   (1.626 m)   Wt 231 lb (104.8 kg)   LMP 02/22/2011   SpO2 96%   BMI 39.65 kg/m  General: Well Developed, well nourished, and in no acute distress.   MSK: Left knee mild effusion  normal motion with crepitation  Not particularly tender to palpation.  Stable ligamentous exam. Intact strength.    Lab and Radiology Results  Procedure: Real-time Ultrasound Guided Injection of left knee lateral superior patellar space Device: Philips Affiniti 50G Images permanently stored and available for review in the ultrasound unit. Verbal informed consent obtained.  Discussed risks and benefits of procedure. Warned about infection bleeding damage to structures skin hypopigmentation and fat atrophy among others. Patient expresses understanding and agreement Time-out conducted.   Noted no overlying erythema, induration, or other signs of local infection.   Skin prepped in a sterile fashion.   Local anesthesia: Topical Ethyl chloride.   With sterile technique and under real time ultrasound guidance:  40 mg of Kenalog and 2 mL of Marcaine injected easily.   Completed without difficulty   Pain immediately resolved suggesting accurate placement of the medication.   Advised to call if fevers/chills, erythema, induration, drainage, or persistent bleeding.   Images permanently stored and available for review in the ultrasound unit.  Impression: Technically successful ultrasound guided injection.     EXAM: MRI LUMBAR SPINE WITHOUT CONTRAST  TECHNIQUE: Multiplanar, multisequence MR imaging of the lumbar spine was performed. No intravenous contrast was administered.  COMPARISON:  CT abdomen pelvis dated April 09, 2012.  FINDINGS: Segmentation:  Standard.  Alignment: New trace retrolisthesis at L2-L3. Unchanged trace retrolisthesis at L3-L4.  Vertebrae:  No fracture, evidence of discitis, or bone lesion.  Conus medullaris and cauda equina: Conus extends to the L1 level. Conus and  cauda equina appear normal.  Paraspinal and other soft tissues: Negative.  Disc levels:  T11-T12: Negative.  T12-L1:  Negative.  L1-L2:  Minimal disc bulging. No stenosis.  L2-L3:  Mild disc bulging. No stenosis.  L3-L4:  Mild disc bulging. No stenosis.  L4-L5:  Negative.  L5-S1: Chronic severe disc height loss and mild disc bulging. Unchanged mild bilateral facet arthropathy. Unchanged mild bilateral neuroforaminal stenosis. No spinal canal stenosis.  IMPRESSION: 1. Multilevel lumbar spondylosis, greatest at L5-S1. No high-grade stenosis or impingement.   Electronically Signed   By: Titus Dubin M.D.   On: 07/02/2019 16:05 I, Lynne Leader, personally (independently) visualized and performed the interpretation of the images attached in this note.      Assessment and Plan: 49 y.o. female with lumbar radiculopathy. Improved with epidural steroid injection. Pain has continued a bit. If it worsens again would likely proceed with repeat injection. Also would consider second opinion with spine surgeon. Would recommend Dr. Lynann Bologna at [place].  Additionally patient has bilateral knee pain left worse than right. This fundamentally is due to degenerative changes. At this point reasonable to proceed with repeat steroid injection and if not improved would consider hyaluronic acid injection. Ultimately will probably need a knee replacement at some point.    PDMP not reviewed this encounter. Orders Placed This Encounter  Procedures  . [place] LIMITED JOINT SPACE STRUCTURES LOW RIGHT(NO LINKED CHARGES)    Order Specific Question:   Reason for Exam (SYMPTOM  OR DIAGNOSIS REQUIRED)    Answer:   bilateral knee pain    Order Specific Question:   Preferred imaging location?    Answer:   [place]   No orders of the defined types were placed in this encounter.    Discussed warning signs or symptoms. Please see discharge instructions.  Patient expresses understanding.   The above documentation has been reviewed and is accurate and complete Lynne Leader, M.D.

## 2019-09-06 NOTE — Patient Instructions (Signed)
Thank you for coming in today. Call or go to the ER if you develop a large red swollen joint with extreme pain or oozing puss.  Plan for repeat back injection if needed.  Let me know.

## 2019-09-27 ENCOUNTER — Encounter: Payer: Self-pay | Admitting: Family Medicine

## 2019-10-04 ENCOUNTER — Encounter: Payer: Self-pay | Admitting: Family Medicine

## 2019-10-06 ENCOUNTER — Other Ambulatory Visit: Payer: Self-pay | Admitting: Family Medicine

## 2019-10-06 MED ORDER — LISINOPRIL 10 MG PO TABS: 10.0000 mg | ORAL_TABLET | Freq: Two times a day (BID) | ORAL | 3 refills | Status: DC

## 2019-11-21 ENCOUNTER — Ambulatory Visit (INDEPENDENT_AMBULATORY_CARE_PROVIDER_SITE_OTHER): Payer: 59 | Admitting: Family Medicine

## 2019-11-21 ENCOUNTER — Encounter: Payer: Self-pay | Admitting: Family Medicine

## 2019-11-21 ENCOUNTER — Other Ambulatory Visit: Payer: Self-pay

## 2019-11-21 VITALS — BP 120/68 | HR 85 | Temp 98.0°F | Resp 12 | Ht 64.0 in | Wt 226.8 lb

## 2019-11-21 DIAGNOSIS — I10 Essential (primary) hypertension: Secondary | ICD-10-CM

## 2019-11-21 DIAGNOSIS — Z1211 Encounter for screening for malignant neoplasm of colon: Secondary | ICD-10-CM

## 2019-11-21 DIAGNOSIS — Z789 Other specified health status: Secondary | ICD-10-CM | POA: Diagnosis not present

## 2019-11-21 DIAGNOSIS — Z0000 Encounter for general adult medical examination without abnormal findings: Secondary | ICD-10-CM

## 2019-11-21 DIAGNOSIS — R252 Cramp and spasm: Secondary | ICD-10-CM

## 2019-11-21 DIAGNOSIS — E559 Vitamin D deficiency, unspecified: Secondary | ICD-10-CM

## 2019-11-21 DIAGNOSIS — G4733 Obstructive sleep apnea (adult) (pediatric): Secondary | ICD-10-CM

## 2019-11-21 DIAGNOSIS — E785 Hyperlipidemia, unspecified: Secondary | ICD-10-CM

## 2019-11-21 DIAGNOSIS — R739 Hyperglycemia, unspecified: Secondary | ICD-10-CM

## 2019-11-21 NOTE — Patient Instructions (Addendum)
Shingrix is the new shingles shot 2 shots over 2-6 months, check with insurance.Marland KitchenMarland KitchenDo they cover? And if so where do they cover best if they cover office call for nurse appt.   Preventive Care 33-51 Years Old, Female Preventive care refers to visits with your health care provider and lifestyle choices that can promote health and wellness. This includes:  A yearly physical exam. This may also be called an annual well check.  Regular dental visits and eye exams.  Immunizations.  Screening for certain conditions.  Healthy lifestyle choices, such as eating a healthy diet, getting regular exercise, not using drugs or products that contain nicotine and tobacco, and limiting alcohol use. What can I expect for my preventive care visit? Physical exam Your health care provider will check your:  Height and weight. This may be used to calculate body mass index (BMI), which tells if you are at a healthy weight.  Heart rate and blood pressure.  Skin for abnormal spots. Counseling Your health care provider may ask you questions about your:  Alcohol, tobacco, and drug use.  Emotional well-being.  Home and relationship well-being.  Sexual activity.  Eating habits.  Work and work Statistician.  Method of birth control.  Menstrual cycle.  Pregnancy history. What immunizations do I need?  Influenza (flu) vaccine  This is recommended every year. Tetanus, diphtheria, and pertussis (Tdap) vaccine  You may need a Td booster every 10 years. Varicella (chickenpox) vaccine  You may need this if you have not been vaccinated. Zoster (shingles) vaccine  You may need this after age 58. Measles, mumps, and rubella (MMR) vaccine  You may need at least one dose of MMR if you were born in 1957 or later. You may also need a second dose. Pneumococcal conjugate (PCV13) vaccine  You may need this if you have certain conditions and were not previously vaccinated. Pneumococcal polysaccharide  (PPSV23) vaccine  You may need one or two doses if you smoke cigarettes or if you have certain conditions. Meningococcal conjugate (MenACWY) vaccine  You may need this if you have certain conditions. Hepatitis A vaccine  You may need this if you have certain conditions or if you travel or work in places where you may be exposed to hepatitis A. Hepatitis B vaccine  You may need this if you have certain conditions or if you travel or work in places where you may be exposed to hepatitis B. Haemophilus influenzae type b (Hib) vaccine  You may need this if you have certain conditions. Human papillomavirus (HPV) vaccine  If recommended by your health care provider, you may need three doses over 6 months. You may receive vaccines as individual doses or as more than one vaccine together in one shot (combination vaccines). Talk with your health care provider about the risks and benefits of combination vaccines. What tests do I need? Blood tests  Lipid and cholesterol levels. These may be checked every 5 years, or more frequently if you are over 38 years old.  Hepatitis C test.  Hepatitis B test. Screening  Lung cancer screening. You may have this screening every year starting at age 17 if you have a 30-pack-year history of smoking and currently smoke or have quit within the past 15 years.  Colorectal cancer screening. All adults should have this screening starting at age 105 and continuing until age 50. Your health care provider may recommend screening at age 73 if you are at increased risk. You will have tests every 1-10 years, depending  on your results and the type of screening test.  Diabetes screening. This is done by checking your blood sugar (glucose) after you have not eaten for a while (fasting). You may have this done every 1-3 years.  Mammogram. This may be done every 1-2 years. Talk with your health care provider about when you should start having regular mammograms. This may  depend on whether you have a family history of breast cancer.  BRCA-related cancer screening. This may be done if you have a family history of breast, ovarian, tubal, or peritoneal cancers.  Pelvic exam and Pap test. This may be done every 3 years starting at age 21. Starting at age 30, this may be done every 5 years if you have a Pap test in combination with an HPV test. Other tests  Sexually transmitted disease (STD) testing.  Bone density scan. This is done to screen for osteoporosis. You may have this scan if you are at high risk for osteoporosis. Follow these instructions at home: Eating and drinking  Eat a diet that includes fresh fruits and vegetables, whole grains, lean protein, and low-fat dairy.  Take vitamin and mineral supplements as recommended by your health care provider.  Do not drink alcohol if: ? Your health care provider tells you not to drink. ? You are pregnant, may be pregnant, or are planning to become pregnant.  If you drink alcohol: ? Limit how much you have to 0-1 drink a day. ? Be aware of how much alcohol is in your drink. In the [place] one drink equals one 12 oz bottle of beer (355 mL), one 5 oz glass of wine (148 mL), or one 1 oz glass of hard liquor (44 mL). Lifestyle  Take daily care of your teeth and gums.  Stay active. Exercise for at least 30 minutes on 5 or more days each week.  Do not use any products that contain nicotine or tobacco, such as cigarettes, e-cigarettes, and chewing tobacco. If you need help quitting, ask your health care provider.  If you are sexually active, practice safe sex. Use a condom or other form of birth control (contraception) in order to prevent pregnancy and STIs (sexually transmitted infections).  If told by your health care provider, take low-dose aspirin daily starting at age 50. What's next?  Visit your health care provider once a year for a well check visit.  Ask your health care provider how often you should  have your eyes and teeth checked.  Stay up to date on all vaccines. This information is not intended to replace advice given to you by your health care provider. Make sure you discuss any questions you have with your health care provider. Document Revised: 10/26/2017 Document Reviewed: 10/26/2017 Elsevier Patient Education  2020 [employer]  

## 2019-11-21 NOTE — Assessment & Plan Note (Addendum)
Patient encouraged to maintain heart healthy diet, regular exercise, adequate sleep. Consider daily probiotics. Take medications as prescribed. Labs ordered and reviewed. Referred for colonoscopy 

## 2019-11-21 NOTE — Assessment & Plan Note (Signed)
Supplement and monitor 

## 2019-11-21 NOTE — Assessment & Plan Note (Signed)
Maintain balanced diet

## 2019-11-23 DIAGNOSIS — R739 Hyperglycemia, unspecified: Secondary | ICD-10-CM | POA: Insufficient documentation

## 2019-11-23 LAB — LIPID PANEL
Cholesterol: 154 mg/dL (ref <200)
HDL: 50 mg/dL (ref >=50)
LDL Cholesterol (Calc): 86 mg/dL (calc)
Non-HDL Cholesterol (Calc): 104 mg/dL (calc) (ref <130)
Total CHOL/HDL Ratio: 3.1 (calc) (ref <5.0)
Triglycerides: 89 mg/dL (ref <150)

## 2019-11-23 LAB — COMPREHENSIVE METABOLIC PANEL
AG Ratio: 2.1 (calc) (ref 1.0–2.5)
ALT: 32 U/L — ABNORMAL HIGH (ref 6–29)
AST: 18 U/L (ref 10–35)
Albumin: 4.7 g/dL (ref 3.6–5.1)
Alkaline phosphatase (APISO): 63 U/L (ref 37–153)
BUN: 11 mg/dL (ref 7–25)
CO2: 31 mmol/L (ref 20–32)
Calcium: 10.3 mg/dL (ref 8.6–10.4)
Chloride: 105 mmol/L (ref 98–110)
Creat: 0.62 mg/dL (ref 0.50–1.05)
Globulin: 2.2 g/dL (calc) (ref 1.9–3.7)
Glucose, Bld: 96 mg/dL (ref 65–99)
Potassium: 4.9 mmol/L (ref 3.5–5.3)
Sodium: 142 mmol/L (ref 135–146)
Total Bilirubin: 0.5 mg/dL (ref 0.2–1.2)
Total Protein: 6.9 g/dL (ref 6.1–8.1)

## 2019-11-23 LAB — TSH: TSH: 1.47 mIU/L

## 2019-11-23 LAB — VITAMIN D 25 HYDROXY (VIT D DEFICIENCY, FRACTURES): Vit D, 25-Hydroxy: 39 ng/mL (ref 30–100)

## 2019-11-23 LAB — CBC
HCT: 41.2 % (ref 35.0–45.0)
Hemoglobin: 13.9 g/dL (ref 11.7–15.5)
MCH: 30.3 pg (ref 27.0–33.0)
MCHC: 33.7 g/dL (ref 32.0–36.0)
MCV: 89.8 fL (ref 80.0–100.0)
MPV: 9.5 fL (ref 7.5–12.5)
Platelets: 347 10*3/uL (ref 140–400)
RBC: 4.59 10*6/uL (ref 3.80–5.10)
RDW: 13.1 % (ref 11.0–15.0)
WBC: 6 10*3/uL (ref 3.8–10.8)

## 2019-11-23 LAB — HEMOGLOBIN A1C
Hgb A1c MFr Bld: 5 % of total Hgb (ref <5.7)
Mean Plasma Glucose: 97 (calc)
eAG (mmol/L): 5.4 (calc)

## 2019-11-23 LAB — MAGNESIUM: Magnesium: 2.1 mg/dL (ref 1.5–2.5)

## 2019-11-23 NOTE — Assessment & Plan Note (Signed)
Well controlled, no changes to meds. Encouraged heart healthy diet such as the DASH diet and exercise as tolerated.  °

## 2019-11-23 NOTE — Progress Notes (Signed)
Subjective:    Patient ID: [name], female    DOB: 04/11/1970, 49 y.o.   MRN: [id number]  Chief Complaint  Patient presents with  . Annual Exam    HPI Patient is in today for annual preventative exam and follow up on chronic medical concerns. No recent febrile illness or hospitalizations. No c/o polyuria or polydipsia. She notes fatigue and she does not use her oral appliance as she has not found it helpful. She is ready to see pulmonology again. She is trying to stay active and eat a balanced diet. She is a vegetarian. She tolerated her [place]. Denies CP/palp/SOB/HA/congestion/fevers/GI or GU c/o. Taking meds as prescribed  Past Medical History:  Diagnosis Date  . Acute mastitis of left breast 10/04/2007  . AMA (advanced maternal age) multigravida 4+   . Cancer ([place]) 2002   Left leg  . Celiac disease   . Chronic back pain    H/O  . Complication of anesthesia    Difficult time waking up  . Constipation in pregnancy 03/2006  . Endometriosis    h/o  . Environmental allergies   . Fibroid    During first pregnancy  . Fibromyalgia   . First trimester bleeding 2008  . GBS carrier   . GERD (gastroesophageal reflux disease)   . Granuloma annulare   . H/O amenorrhea 10/2006  . H/O blood clots    7 yr ago in arm  . H/O varicella    As an infant.  . Headache(784.0)   . Hearing loss 08/15/2016  . Heart murmur   . HTN (hypertension)   . Hyperlipidemia 03/02/2017  . IBS (irritable bowel syndrome) 08/15/2016  . Irregular uterine bleeding   . Joint pain   . Lactose intolerance   . Leg edema   . Leg pain, left 02/09/2016  . Monilial vaginitis 07/27/04  . Palpitations   . Rh negative status during pregnancy   . SAB (spontaneous abortion)    h/o x 2  . Seasonal allergies   . Sleep apnea 08/15/2016  . TMJ syndrome   . Vegetarianism 08/15/2016  . Vitamin D deficiency 08/16/2015    Past Surgical History:  Procedure Laterality Date  . ABDOMINAL HYSTERECTOMY  2013  .  APPENDECTOMY  2002  . DILATION AND EVACUATION    . endocervical polyp removal  2001  . ENDOMETRIAL ABLATION  2010  . HERNIA REPAIR  05/16/12   incisional hernia repair  . HYSTEROSCOPY W/ ENDOMETRIAL ABLATION  01/28/2009  . INSERTION OF MESH N/A 05/16/2012   Procedure: INSERTION OF MESH;  Surgeon: Joyice Faster. Cornett, MD;  Location: [facility];  Service: General;  Laterality: N/A;  . KNEE ARTHROSCOPY Left 12/05/2012   Procedure: LEFT KNEE ARTHROSCOPY;  Surgeon: Kerin Salen, MD;  Location: [facility];  Service: Orthopedics;  Laterality: Left;  [name] MELANOMA EXCISION  2002   Left Leg  . melanoma removal   2003  . SALPINGOOPHORECTOMY  12/08/2011   Procedure: SALPINGO OOPHERECTOMY;  Surgeon: Eldred Manges, MD;  Location: [facility];  Service: Gynecology;  Laterality: Bilateral;  . TENDON RELEASE  2004  . uterine curettage  01/2009  . VENTRAL HERNIA REPAIR N/A 05/16/2012   Procedure: LAPAROSCOPIC VENTRAL HERNIA;  Surgeon: Joyice Faster. Cornett, MD;  Location: [facility];  Service: General;  Laterality: N/A;  . WISDOM TOOTH EXTRACTION  1989    Family History  Problem Relation Age of Onset  . Hypertension Mother   . Cancer Mother  breast  . Heart disease Mother   . Anxiety disorder Mother   . Heart disease Father        atrial fib  . Hypertension Father   . Cancer Father        Lyphoma,Bladder  . Atrial fibrillation Father   . Obesity Father   . Cancer Maternal Grandmother        breast  . Heart disease Maternal Grandfather        MI  . Heart disease Paternal Grandfather        MI  . Cancer Maternal Aunt        ovarian  . Diverticulitis Brother     Social History   Socioeconomic History  . Marital status: Married    Spouse name: Psychiatric nurse  . Number of children: 3  . Years of education: Not on file  . Highest education level: Not on file  Occupational History  . Occupation: Armed forces operational officer  Tobacco Use  . Smoking status: Former Smoker    Types: Cigarettes     Quit date: 02/29/1988    Years since quitting: 31.7  . Smokeless tobacco: Never Used  Vaping Use  . Vaping Use: Never used  Substance and Sexual Activity  . Alcohol use: No  . Drug use: No  . Sexual activity: Yes    Birth control/protection: Surgical    Comment: BTL  Other Topics Concern  . 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 Difficulty of Paying Living Expenses: Not on file  Food Insecurity:   . Worried About Charity fundraiser in the Last Year: Not on file  . Ran Out of Food in the Last Year: Not on file  Transportation Needs:   . Lack of Transportation (Medical): Not on file  . Lack of Transportation (Non-Medical): Not on file  Physical Activity:   . Days of Exercise per Week: Not on file  . Minutes of Exercise per Session: Not on file  Stress:   . Feeling of Stress : Not on file  Social Connections:   . Frequency of Communication with Friends and Family: Not on file  . Frequency of Social Gatherings with Friends and Family: Not on file  . Attends Religious Services: Not on file  . Active Member of Clubs or Organizations: Not on file  . Attends Archivist Meetings: Not on file  . Marital Status: Not on file  Intimate Partner Violence:   . Fear of Current or Ex-Partner: Not on file  . Emotionally Abused: Not on file  . Physically Abused: Not on file  . Sexually Abused: Not on file    Outpatient Medications Prior to Visit  Medication Sig Dispense Refill  . estradiol (CLIMARA - DOSED IN MG/24 HR) 0.0375 mg/24hr patch Place 0.0375 mg onto the skin once a week.    [name] lisinopril (ZESTRIL) 10 MG tablet Take 1 tablet (10 mg total) by mouth in the morning and at bedtime. 180 tablet 3  . meloxicam (MOBIC) 7.5 MG tablet Take 7.5 mg by mouth daily.    . Misc. Devices (DENTAL GUARD) MISC 1 Device by Does  not apply route See admin instructions. 1 each 0  . Multiple Vitamins-Minerals (MULTIVITAMIN ADULT PO) Take by mouth.    . Vitamin D, Ergocalciferol, (  DRISDOL) 1.25 MG (50000 UNIT) CAPS capsule Take 1 capsule (50,000 Units total) by mouth every 7 (seven) days. 12 capsule 0  . gabapentin (NEURONTIN) 300 MG capsule Take 1 capsule (300 mg total) by mouth 3 (three) times daily as needed (nerve pain). 90 capsule 3  . HYDROcodone-acetaminophen (NORCO/VICODIN) 5-325 MG tablet hydrocodone 5 mg-acetaminophen 325 mg tablet    . metroNIDAZOLE (METROCREAM) 0.75 % cream metronidazole 0.75 % topical cream    . omeprazole (PRILOSEC) 20 MG capsule omeprazole 20 mg capsule,delayed release     No facility-administered medications prior to visit.    Allergies  Allergen Reactions  . Cymbalta [Duloxetine Hcl] Other (See Comments)    Makes her feel crazy, out of body  . Betadine [Povidone Iodine] Other (See Comments)    Dx with allergy testing  . Iodine   . Shellfish Allergy Other (See Comments)    Dx with allergy testing    Review of Systems  Constitutional: Positive for malaise/fatigue. Negative for chills and fever.  HENT: Negative for congestion and hearing loss.   Eyes: Negative for discharge.  Respiratory: Negative for cough, sputum production and shortness of breath.   Cardiovascular: Negative for chest pain, palpitations and leg swelling.  Gastrointestinal: Negative for abdominal pain, blood in stool, constipation, diarrhea, heartburn, nausea and vomiting.  Genitourinary: Negative for dysuria, frequency, hematuria and urgency.  Musculoskeletal: Negative for back pain, falls and myalgias.  Skin: Negative for rash.  Neurological: Positive for headaches. Negative for dizziness, sensory change, loss of consciousness and weakness.  Endo/Heme/Allergies: Negative for environmental allergies. Does not bruise/bleed easily.  Psychiatric/Behavioral: Negative for depression and suicidal ideas. The patient is  not nervous/anxious and does not have insomnia.        Objective:    Physical Exam Constitutional:      General: She is not in acute distress.    Appearance: She is not diaphoretic.  HENT:     Head: Normocephalic and atraumatic.     Right Ear: External ear normal.     Left Ear: External ear normal.     Nose: Nose normal.     Mouth/Throat:     Pharynx: No oropharyngeal exudate.  Eyes:     General: No scleral icterus.       Right eye: No discharge.        Left eye: No discharge.     Conjunctiva/sclera: Conjunctivae normal.     Pupils: Pupils are equal, round, and reactive to light.  Neck:     Thyroid: No thyromegaly.  Cardiovascular:     Rate and Rhythm: Normal rate and regular rhythm.     Heart sounds: Normal heart sounds. No murmur heard.   Pulmonary:     Effort: Pulmonary effort is normal. No respiratory distress.     Breath sounds: Normal breath sounds. No wheezing or rales.  Abdominal:     General: Bowel sounds are normal. There is no distension.     Palpations: Abdomen is soft. There is no mass.     Tenderness: There is no abdominal tenderness.  Musculoskeletal:        General: No tenderness. Normal range of motion.     Cervical back: Normal range of motion and neck supple.  Lymphadenopathy:     Cervical: No cervical adenopathy.  Skin:    General: Skin is warm and dry.     Findings: No rash.  Neurological:     Mental Status: She is alert and oriented to person, place, and time.  Cranial Nerves: No cranial nerve deficit.     Coordination: Coordination normal.     Deep Tendon Reflexes: Reflexes are normal and symmetric. Reflexes normal.     BP 120/68 (BP Location: Right Arm, Patient Position: Sitting, Cuff Size: Large)   Pulse 85   Temp 98 F (36.7 C) (Oral)   Resp 12   Ht 5\' 4"  (1.626 m)   Wt 226 lb 12.8 oz (102.9 kg)   LMP 02/22/2011   SpO2 95%   BMI 38.93 kg/m  Wt Readings from Last 3 Encounters:  11/21/19 226 lb 12.8 oz (102.9 kg)  09/06/19  231 lb (104.8 kg)  07/23/19 230 lb (104.3 kg)    Diabetic Foot Exam - Simple   No data filed     Lab Results  Component Value Date   WBC 6.0 11/21/2019   HGB 13.9 11/21/2019   HCT 41.2 11/21/2019   PLT 347 11/21/2019   GLUCOSE 96 11/21/2019   CHOL 154 11/21/2019   TRIG 89 11/21/2019   HDL 50 11/21/2019   LDLCALC 86 11/21/2019   ALT 32 (H) 11/21/2019   AST 18 11/21/2019   NA 142 11/21/2019   K 4.9 11/21/2019   CL 105 11/21/2019   CREATININE 0.62 11/21/2019   BUN 11 11/21/2019   CO2 31 11/21/2019   TSH 1.47 11/21/2019   HGBA1C 5.0 11/21/2019    Lab Results  Component Value Date   TSH 1.47 11/21/2019   Lab Results  Component Value Date   WBC 6.0 11/21/2019   HGB 13.9 11/21/2019   HCT 41.2 11/21/2019   MCV 89.8 11/21/2019   PLT 347 11/21/2019   Lab Results  Component Value Date   NA 142 11/21/2019   K 4.9 11/21/2019   CO2 31 11/21/2019   GLUCOSE 96 11/21/2019   BUN 11 11/21/2019   CREATININE 0.62 11/21/2019   BILITOT 0.5 11/21/2019   ALKPHOS 63 07/18/2019   AST 18 11/21/2019   ALT 32 (H) 11/21/2019   PROT 6.9 11/21/2019   ALBUMIN 4.5 07/18/2019   CALCIUM 10.3 11/21/2019   GFR 112.02 07/18/2019   Lab Results  Component Value Date   CHOL 154 11/21/2019   Lab Results  Component Value Date   HDL 50 11/21/2019   Lab Results  Component Value Date   LDLCALC 86 11/21/2019   Lab Results  Component Value Date   TRIG 89 11/21/2019   Lab Results  Component Value Date   CHOLHDL 3.1 11/21/2019   Lab Results  Component Value Date   HGBA1C 5.0 11/21/2019       Assessment & Plan:   Problem List Items Addressed This Visit    Essential hypertension - Primary    Well controlled, no changes to meds. Encouraged heart healthy diet such as the DASH diet and exercise as tolerated.       Relevant Orders   CBC (Completed)   Comprehensive metabolic panel (Completed)   Lipid panel (Completed)   TSH (Completed)   Muscle cramps   Relevant Orders    Magnesium (Completed)   Preventative health care    Patient encouraged to maintain heart healthy diet, regular exercise, adequate sleep. Consider daily probiotics. Take medications as prescribed. Labs ordered and reviewed. Referred for colonoscopy      Vitamin D deficiency    Supplement and monitor      Relevant Orders   VITAMIN D 25 Hydroxy (Vit-D Deficiency, Fractures) (Completed)   Vegetarianism    Maintain balanced diet  Sleep apnea    Referred to pulmonology for further evaluation. She tried a oral appliance but it was unsuccessful.       Relevant Orders   Ambulatory referral to Pulmonology   Hyperlipidemia     encouraged heart healthy diet, avoid trans fats, minimize simple carbs and saturated fats. Increase exercise as tolerated      Hyperglycemia    hgba1c acceptable, minimize simple carbs. Increase exercise as tolerated.       Relevant Orders   Hemoglobin A1c (Completed)   Lipid panel (Completed)    Other Visit Diagnoses    Colon cancer screening       Relevant Orders   Ambulatory referral to Gastroenterology      I have discontinued [name] A. Malburg's gabapentin, omeprazole, metroNIDAZOLE, and HYDROcodone-acetaminophen. I am also having her maintain her Multiple Vitamins-Minerals (MULTIVITAMIN ADULT PO), Dental Guard, estradiol, meloxicam, Vitamin D (Ergocalciferol), and lisinopril.  No orders of the defined types were placed in this encounter.    Penni Homans, MD

## 2019-11-23 NOTE — Assessment & Plan Note (Signed)
Referred to pulmonology for further evaluation. She tried a oral appliance but it was unsuccessful.

## 2019-11-23 NOTE — Assessment & Plan Note (Signed)
encouraged heart healthy diet, avoid trans fats, minimize simple carbs and saturated fats. Increase exercise as tolerated 

## 2019-11-23 NOTE — Assessment & Plan Note (Signed)
hgba1c acceptable, minimize simple carbs. Increase exercise as tolerated.  

## 2020-01-09 ENCOUNTER — Encounter: Payer: Self-pay | Admitting: Gastroenterology

## 2020-01-31 ENCOUNTER — Ambulatory Visit: Payer: 59 | Admitting: Pulmonary Disease

## 2020-03-03 ENCOUNTER — Encounter: Payer: Self-pay | Admitting: Family Medicine

## 2020-03-04 ENCOUNTER — Encounter: Payer: Self-pay | Admitting: Family Medicine

## 2020-03-06 ENCOUNTER — Other Ambulatory Visit: Payer: Self-pay

## 2020-03-10 ENCOUNTER — Ambulatory Visit: Payer: 59 | Admitting: Pulmonary Disease

## 2020-03-11 ENCOUNTER — Encounter: Payer: 59 | Admitting: Gastroenterology

## 2020-03-12 ENCOUNTER — Encounter: Payer: 59 | Admitting: Internal Medicine

## 2020-03-25 ENCOUNTER — Other Ambulatory Visit: Payer: Self-pay

## 2020-03-25 ENCOUNTER — Ambulatory Visit (AMBULATORY_SURGERY_CENTER): Payer: Self-pay

## 2020-03-25 VITALS — Ht 64.0 in | Wt 232.0 lb

## 2020-03-25 DIAGNOSIS — Z1211 Encounter for screening for malignant neoplasm of colon: Secondary | ICD-10-CM

## 2020-03-25 MED ORDER — SUTAB 1479-225-188 MG PO TABS
1.0000 | ORAL_TABLET | ORAL | 0 refills | Status: DC
Start: 2020-03-25 — End: 2020-04-08

## 2020-03-25 NOTE — Progress Notes (Signed)
No egg or soy allergy known to patient  No issues with past sedation with any surgeries or procedures No intubation problems in the past  No FH of Malignant Hyperthermia No diet pills per patient No home 02 use per patient  No blood thinners per patient  Pt denies issues with constipation  No A fib or A flutter  EMMI video via MyChart  COVID 19 guidelines implemented in [facility] today with Pt and RN  Pt is fully vaccinated  for Covid x 2;  Coupon given to pt in [facility] today , Code to Pharmacy  Due to the COVID-19 pandemic we are asking patients to follow certain guidelines.  Pt aware of COVID protocols and [facility] guidelines   

## 2020-03-26 ENCOUNTER — Encounter: Payer: Self-pay | Admitting: Internal Medicine

## 2020-04-08 ENCOUNTER — Other Ambulatory Visit: Payer: Self-pay

## 2020-04-08 ENCOUNTER — Ambulatory Visit (AMBULATORY_SURGERY_CENTER): Payer: 59 | Admitting: Internal Medicine

## 2020-04-08 ENCOUNTER — Encounter: Payer: Self-pay | Admitting: Internal Medicine

## 2020-04-08 VITALS — BP 136/84 | HR 74 | Temp 98.6°F | Resp 14 | Ht 64.0 in | Wt 232.0 lb

## 2020-04-08 DIAGNOSIS — Z1211 Encounter for screening for malignant neoplasm of colon: Secondary | ICD-10-CM | POA: Diagnosis not present

## 2020-04-08 MED ORDER — SODIUM CHLORIDE 0.9 % IV SOLN
500.0000 mL | Freq: Once | INTRAVENOUS | Status: DC
Start: 2020-04-08 — End: 2020-04-08

## 2020-04-08 NOTE — Progress Notes (Signed)
Pt Drowsy. VSS. To PACU, report to RN. No anesthetic complications noted.  

## 2020-04-08 NOTE — Progress Notes (Signed)
Pt's states no medical or surgical changes since previsit or office visit. 

## 2020-04-08 NOTE — Patient Instructions (Signed)
Discharge instructions given. Handouts on diverticulosis and hemorrhoids. Resume previous medications. YOU HAD AN ENDOSCOPIC PROCEDURE TODAY AT [facility]:   Refer to the procedure report that was given to you for any specific questions about what was found during the examination.  If the procedure report does not answer your questions, please call your gastroenterologist to clarify.  If you requested that your care partner not be given the details of your procedure findings, then the procedure report has been included in a sealed envelope for you to review at your convenience later.  YOU SHOULD EXPECT: Some feelings of bloating in the abdomen. Passage of more gas than usual.  Walking can help get rid of the air that was put into your GI tract during the procedure and reduce the bloating. If you had a lower endoscopy (such as a colonoscopy or flexible sigmoidoscopy) you may notice spotting of blood in your stool or on the toilet paper. If you underwent a bowel prep for your procedure, you may not have a normal bowel movement for a few days.  Please Note:  You might notice some irritation and congestion in your nose or some drainage.  This is from the oxygen used during your procedure.  There is no need for concern and it should clear up in a day or so.  SYMPTOMS TO REPORT IMMEDIATELY:   Following lower endoscopy (colonoscopy or flexible sigmoidoscopy):  Excessive amounts of blood in the stool  Significant tenderness or worsening of abdominal pains  Swelling of the abdomen that is new, acute  Fever of 100F or higher   For urgent or emergent issues, a gastroenterologist can be reached at any hour by calling [phone]. Do not use MyChart messaging for urgent concerns.    DIET:  We do recommend a small meal at first, but then you may proceed to your regular diet.  Drink plenty of fluids but you should avoid alcoholic beverages for 24 hours.  ACTIVITY:  You should plan to  take it easy for the rest of today and you should NOT DRIVE or use heavy machinery until tomorrow (because of the sedation medicines used during the test).    FOLLOW UP: Our staff will call the number listed on your records 48-72 hours following your procedure to check on you and address any questions or concerns that you may have regarding the information given to you following your procedure. If we do not reach you, we will leave a message.  We will attempt to reach you two times.  During this call, we will ask if you have developed any symptoms of COVID 19. If you develop any symptoms (ie: fever, flu-like symptoms, shortness of breath, cough etc.) before then, please call [phone].  If you test positive for Covid 19 in the 2 weeks post procedure, please call and report this information to [place].    If any biopsies were taken you will be contacted by phone or by letter within the next 1-3 weeks.  Please call us at [phone] if you have not heard about the biopsies in 3 weeks.    SIGNATURES/CONFIDENTIALITY: You and/or your care partner have signed paperwork which will be entered into your electronic medical record.  These signatures attest to the fact that that the information above on your After Visit Summary has been reviewed and is understood.  Full responsibility of the confidentiality of this discharge information lies with you and/or your care-partner.

## 2020-04-08 NOTE — Op Note (Signed)
[name] Patient Name: [name] Procedure Date: 04/08/2020 2:39 PM MRN: [id number] Endoscopist: Jerene Bears , MD Age: 50 Referring MD:  Date of Birth: 29-Sep-1970 Gender: Female Account #: [id number] Procedure:                Colonoscopy Indications:              Screening for colorectal malignant neoplasm, This                            is the patient's first colonoscopy Medicines:                Monitored Anesthesia Care Procedure:                Pre-Anesthesia Assessment:                           - Prior to the procedure, a History and Physical                            was performed, and patient medications and                            allergies were reviewed. The patient's tolerance of                            previous anesthesia was also reviewed. The risks                            and benefits of the procedure and the sedation                            options and risks were discussed with the patient.                            All questions were answered, and informed consent                            was obtained. Prior Anticoagulants: The patient has                            taken no previous anticoagulant or antiplatelet                            agents. ASA Grade Assessment: III - A patient with                            severe systemic disease. After reviewing the risks                            and benefits, the patient was deemed in                            satisfactory condition to undergo the procedure.  After obtaining informed consent, the colonoscope                            was passed under direct vision. Throughout the                            procedure, the patient's blood pressure, pulse, and                            oxygen saturations were monitored continuously. The                            Olympus PCF-H190DL (#[id number]) Colonoscope was                            introduced through the anus  and advanced to the                            cecum, identified by appendiceal orifice and                            ileocecal valve. The colonoscopy was performed                            without difficulty. The patient tolerated the                            procedure well. The quality of the bowel                            preparation was good. The ileocecal valve,                            appendiceal orifice, and rectum were photographed. Scope In: 2:52:48 PM Scope Out: 3:08:21 PM Scope Withdrawal Time: 0 hours 11 minutes 59 seconds  Total Procedure Duration: 0 hours 15 minutes 33 seconds  Findings:                 Minor prolapsed rectal tissue found on perianal                            exam.                           A few small-mouthed diverticula were found in the                            sigmoid colon and descending colon.                           Internal hemorrhoids were found during                            retroflexion. The hemorrhoids were small.  The exam was otherwise without abnormality. Complications:            No immediate complications. Estimated Blood Loss:     Estimated blood loss: none. Impression:               - Mild very distal rectal prolapse.                           - Diverticulosis in the sigmoid colon and in the                            descending colon.                           - Small internal hemorrhoids.                           - The examination was otherwise normal.                           - No specimens collected. Recommendation:           - Patient has a contact number available for                            emergencies. The signs and symptoms of potential                            delayed complications were discussed with the                            patient. Return to normal activities tomorrow.                            Written discharge instructions were provided to the                             patient.                           - Resume previous diet.                           - Continue present medications.                           - Resume daily Metamucil to help aid in regular and                            complete bowel movements.                           - Repeat colonoscopy in 10 years for screening                            purposes. Jerene Bears, MD 04/08/2020 3:12:51 PM This report has been signed electronically.

## 2020-04-08 NOTE — Progress Notes (Signed)
VS taken by C.W. 

## 2020-04-10 ENCOUNTER — Telehealth: Payer: Self-pay | Admitting: *Deleted

## 2020-04-10 NOTE — Telephone Encounter (Signed)
  Follow up Call-  Call back number 04/08/2020  Post procedure Call Back phone  # [phone]  Permission to leave phone message Yes  Some recent data might be hidden     Patient questions:  Do you have a fever, pain , or abdominal swelling? No. Pain Score  0 *  Have you tolerated food without any problems? Yes.    Have you been able to return to your normal activities? Yes.    Do you have any questions about your discharge instructions: Diet   No. Medications  No. Follow up visit  No.  Do you have questions or concerns about your Care? No.  Actions: * If pain score is 4 or above: No action needed, pain <4.  1. Have you developed a fever since your procedure? no  2.   Have you had an respiratory symptoms (SOB or cough) since your procedure? no  3.   Have you tested positive for COVID 19 since your procedure no  4.   Have you had any family members/close contacts diagnosed with the COVID 19 since your procedure?  no   If yes to any of these questions please route to Joylene John, RN and Joella Prince, RN

## 2020-05-04 ENCOUNTER — Other Ambulatory Visit: Payer: Self-pay | Admitting: Orthopedic Surgery

## 2020-05-04 ENCOUNTER — Other Ambulatory Visit: Payer: Self-pay | Admitting: Family Medicine

## 2020-05-04 DIAGNOSIS — E559 Vitamin D deficiency, unspecified: Secondary | ICD-10-CM

## 2020-05-04 DIAGNOSIS — M25561 Pain in right knee: Secondary | ICD-10-CM

## 2020-05-07 ENCOUNTER — Ambulatory Visit
Admission: RE | Admit: 2020-05-07 | Discharge: 2020-05-07 | Disposition: A | Discharge status: Home / Self Care | Payer: 59 | Source: Ambulatory Visit | Attending: Orthopedic Surgery | Admitting: Orthopedic Surgery

## 2020-05-07 DIAGNOSIS — M25561 Pain in right knee: Secondary | ICD-10-CM

## 2020-05-08 ENCOUNTER — Other Ambulatory Visit: Payer: 59

## 2020-05-13 ENCOUNTER — Telehealth: Payer: Self-pay | Admitting: Medical

## 2020-05-13 ENCOUNTER — Other Ambulatory Visit: Payer: Self-pay

## 2020-05-13 ENCOUNTER — Ambulatory Visit: Payer: 59 | Admitting: Medical

## 2020-05-13 ENCOUNTER — Encounter: Payer: Self-pay | Admitting: Medical

## 2020-05-13 VITALS — BP 135/84 | HR 80 | Temp 98.3°F | Resp 18 | Ht 64.0 in | Wt 237.0 lb

## 2020-05-13 DIAGNOSIS — Z01818 Encounter for other preprocedural examination: Secondary | ICD-10-CM

## 2020-05-13 DIAGNOSIS — I10 Essential (primary) hypertension: Secondary | ICD-10-CM

## 2020-05-13 DIAGNOSIS — R739 Hyperglycemia, unspecified: Secondary | ICD-10-CM | POA: Diagnosis not present

## 2020-05-13 LAB — CBC WITH DIFFERENTIAL/PLATELET
Basophils Absolute: 0.1 10*3/uL (ref 0.0–0.1)
Basophils Relative: 1 % (ref 0.0–3.0)
Eosinophils Absolute: 0.4 10*3/uL (ref 0.0–0.7)
Eosinophils Relative: 5 % (ref 0.0–5.0)
HCT: 39.7 % (ref 36.0–46.0)
Hemoglobin: 14.1 g/dL (ref 12.0–15.0)
Lymphocytes Relative: 25 % (ref 12.0–46.0)
Lymphs Abs: 1.9 10*3/uL (ref 0.7–4.0)
MCHC: 35.4 g/dL (ref 30.0–36.0)
MCV: 84.8 fl (ref 78.0–100.0)
Monocytes Absolute: 0.6 10*3/uL (ref 0.1–1.0)
Monocytes Relative: 7.3 % (ref 3.0–12.0)
Neutro Abs: 4.7 10*3/uL (ref 1.4–7.7)
Neutrophils Relative %: 61.7 % (ref 43.0–77.0)
Platelets: 330 10*3/uL (ref 150.0–400.0)
RBC: 4.69 Mil/uL (ref 3.87–5.11)
RDW: 13.9 % (ref 11.5–15.5)
WBC: 7.6 10*3/uL (ref 4.0–10.5)

## 2020-05-13 LAB — COMPREHENSIVE METABOLIC PANEL
ALT: 34 U/L (ref 0–35)
AST: 21 U/L (ref 0–37)
Albumin: 4.2 g/dL (ref 3.5–5.2)
Alkaline Phosphatase: 64 U/L (ref 39–117)
BUN: 16 mg/dL (ref 6–23)
CO2: 26 mEq/L (ref 19–32)
Calcium: 9.3 mg/dL (ref 8.4–10.5)
Chloride: 104 mEq/L (ref 96–112)
Creatinine, Ser: 0.44 mg/dL (ref 0.40–1.20)
GFR: 112.02 mL/min (ref >60.00)
Glucose, Bld: 104 mg/dL — ABNORMAL HIGH (ref 70–99)
Potassium: 4.3 mEq/L (ref 3.5–5.1)
Sodium: 138 mEq/L (ref 135–145)
Total Bilirubin: 0.4 mg/dL (ref 0.2–1.2)
Total Protein: 6.4 g/dL (ref 6.0–8.3)

## 2020-05-13 NOTE — Progress Notes (Signed)
Subjective:    Patient ID: [name], female    DOB: 28-Jul-1970, 50 y.o.   MRN: [id number]  HPI  Pt I for pre-op evaluation. She is going to get surgery on May 29, 2020. Pt states may get rt knee replacement first. But she also has left knee pain. Pt really does not know if surgery will authorize surgery just yet.   Pt had general anesthesia before at least 3 times with no reaction.  No history of asthma or any cardiac condition other than htn. Though she mentions rare expiratory wheeze at times. Not currently.  No bleeding disorder, non smoker and no diabetes.   BP is well controlled.  EKG normal today.    Review of Systems  Constitutional: Negative for appetite change, fatigue and fever.  HENT: Negative for congestion and ear pain.   Respiratory: Negative for cough, chest tightness, shortness of breath and wheezing.   Cardiovascular: Negative for chest pain and palpitations.  Gastrointestinal: Negative for abdominal pain, blood in stool, constipation and nausea.  Genitourinary: Negative for dysuria, flank pain, frequency and urgency.  Musculoskeletal: Negative for back pain.  Skin: Negative for rash.  Neurological: Negative for dizziness, weakness, numbness and headaches.  Hematological: Negative for adenopathy. Does not bruise/bleed easily.  Psychiatric/Behavioral: Negative for behavioral problems, confusion and dysphoric mood. The patient is not hyperactive.     Past Medical History:  Diagnosis Date  . Acute mastitis of left breast 10/04/2007  . Allergy    seasonal allergies  . AMA (advanced maternal age) multigravida 18+   . Cancer ([place]) 2002   Left leg melanoma  . Celiac disease   . Chronic back pain    H/O  . Complication of anesthesia    Difficult time waking up  . Constipation in pregnancy 03/2006  . Endometriosis    h/o  . Environmental allergies   . Fibroid    During first pregnancy  . Fibromyalgia    pt denies dx  . First trimester bleeding  2008  . GBS carrier   . GERD (gastroesophageal reflux disease)    with certain foods-OTC PRN  . Granuloma annulare   . H/O amenorrhea 10/2006  . H/O blood clots    7 yr ago in arm  . H/O varicella    As an infant.  . Headache(784.0)   . Hearing loss 08/15/2016  . Heart murmur    heard occassional at MD office  . HTN (hypertension)    on meds  . Hyperlipidemia 03/02/2017   diet controlled  . IBS (irritable bowel syndrome) 08/15/2016  . Irregular uterine bleeding   . Joint pain   . Lactose intolerance   . Leg edema   . Leg pain, left 02/09/2016  . Monilial vaginitis 07/27/04  . Palpitations   . Rh negative status during pregnancy   . SAB (spontaneous abortion)    h/o x 2  . Seasonal allergies   . Sleep apnea 08/15/2016   no CPAP use- uses nightguard  . TMJ syndrome   . Vegetarianism 08/15/2016  . Vitamin D deficiency 08/16/2015     Social History   Socioeconomic History  . Marital status: Married    Spouse name: Psychiatric nurse  . Number of children: 3  . Years of education: Not on file  . Highest education level: Not on file  Occupational History  . Occupation: Armed forces operational officer  Tobacco Use  . Smoking status: Current Some Day Smoker    Types: Cigarettes  Last attempt to quit: 02/29/1988    Years since quitting: 32.2  . Smokeless tobacco: Never Used  . Tobacco comment: 1-3 cigarrettes per week  Vaping Use  . Vaping Use: Never used  Substance and Sexual Activity  . Alcohol use: No  . Drug use: No  . Sexual activity: Yes    Birth control/protection: Surgical    Comment: BTL  Other Topics Concern  . Not on file  Social History Narrative   ** Merged History Encounter **       Lives with husband and 3 children Owns a Animal nutritionist hospital Vegetarian Wears seat belt   Social Determinants of Health   Financial Resource Strain: Not on file  Food Insecurity: Not on file  Transportation Needs: Not on file  Physical Activity: Not on file  Stress: Not on file   Social Connections: Not on file  Intimate Partner Violence: Not on file    Past Surgical History:  Procedure Laterality Date  . ABDOMINAL HYSTERECTOMY  2013  . APPENDECTOMY  2002  . DILATION AND EVACUATION    . endocervical polyp removal  2001  . ENDOMETRIAL ABLATION  2010  . HERNIA REPAIR  05/16/12   incisional hernia repair  . HYSTEROSCOPY W/ ENDOMETRIAL ABLATION  01/28/2009  . INSERTION OF MESH N/A 05/16/2012   Procedure: INSERTION OF MESH;  Surgeon: Joyice Faster. Cornett, MD;  Location: [facility];  Service: General;  Laterality: N/A;  . KNEE ARTHROSCOPY Left 12/05/2012   Procedure: LEFT KNEE ARTHROSCOPY;  Surgeon: Kerin Salen, MD;  Location: [facility];  Service: Orthopedics;  Laterality: Left;  [name] MELANOMA EXCISION  2002   Left Leg  . melanoma removal   2003  . SALPINGOOPHORECTOMY  12/08/2011   Procedure: SALPINGO OOPHERECTOMY;  Surgeon: Eldred Manges, MD;  Location: [facility];  Service: Gynecology;  Laterality: Bilateral;  . TENDON RELEASE  2004  . uterine curettage  01/2009  . VENTRAL HERNIA REPAIR N/A 05/16/2012   Procedure: LAPAROSCOPIC VENTRAL HERNIA;  Surgeon: Joyice Faster. Cornett, MD;  Location: [facility];  Service: General;  Laterality: N/A;  . WISDOM TOOTH EXTRACTION  1989    Family History  Problem Relation Age of Onset  . Hypertension Mother   . Heart disease Mother   . Anxiety disorder Mother   . Breast cancer Mother 23  . Heart disease Father        atrial fib  . Hypertension Father   . Atrial fibrillation Father   . Obesity Father   . Bladder Cancer Father 39  . Lymphoma Father 51  . Breast cancer Maternal Grandmother 56  . Heart disease Maternal Grandfather        MI  . Heart disease Paternal Grandfather        MI  . Ovarian cancer Maternal Aunt 47  . Diverticulitis Brother   . Colon cancer Neg Hx   . Colon polyps Neg Hx   . Esophageal cancer Neg Hx   . Rectal cancer Neg Hx   . Stomach cancer Neg Hx     Allergies  Allergen Reactions  .  Cymbalta [Duloxetine Hcl] Other (See Comments)    Makes her feel crazy, out of body  . Betadine [Povidone Iodine] Other (See Comments)    Dx with allergy testing  . Iodine   . Shellfish Allergy Other (See Comments)    Dx with allergy testing    Current Outpatient Medications on File Prior to Visit  Medication Sig Dispense Refill  .  Cetirizine HCl (ZYRTEC PO) Take 1 tablet by mouth daily.    [name] estradiol (CLIMARA - DOSED IN MG/24 HR) 0.0375 mg/24hr patch Place 0.0375 mg onto the skin once a week.    [name] lisinopril (ZESTRIL) 10 MG tablet Take 1 tablet (10 mg total) by mouth in the morning and at bedtime. 180 tablet 3  . meloxicam (MOBIC) 15 MG tablet Take 15 mg by mouth daily.    . Misc. Devices (DENTAL GUARD) MISC 1 Device by Does not apply route See admin instructions. 1 each 0  . Multiple Vitamins-Minerals (MULTIVITAMIN ADULT PO) Take by mouth.    . Vitamin D, Ergocalciferol, (DRISDOL) 1.25 MG (50000 UNIT) CAPS capsule Take 1 capsule (50,000 Units total) by mouth every 7 (seven) days. 12 capsule 0  . meloxicam (MOBIC) 7.5 MG tablet Take 7.5 mg by mouth daily. (Patient not taking: Reported on 05/13/2020)     No current facility-administered medications on file prior to visit.    BP 135/84 (BP Location: Left Arm, Patient Position: Sitting, Cuff Size: Large)   Pulse 80   Temp 98.3 F (36.8 C) (Oral)   Resp 18   Wt 237 lb (107.5 kg)   LMP 02/22/2011   SpO2 97%   BMI 40.68 kg/m       Objective:   Physical Exam  General Mental Status- Alert. General Appearance- Not in acute distress.   Skin General: Color- Normal Color. Moisture- Normal Moisture.  Neck Carotid Arteries- Normal color. Moisture- Normal Moisture. No carotid bruits. No JVD.  Chest and Lung Exam Auscultation: Breath Sounds:-Normal.  Cardiovascular Auscultation:Rythm- Regular. Murmurs & Other Heart Sounds:Auscultation of the heart reveals- No Murmurs.  Abdomen Inspection:-Inspeection  Normal. Palpation/Percussion:Note:No mass. Palpation and Percussion of the abdomen reveal- Non Tender, Non Distended + BS, no rebound or guarding.   Neurologic Cranial Nerve exam:- CN III-XII intact(No nystagmus), symmetric smile. Strength:- 5/5 equal and symmetric strength both upper and lower extremities.  Left knee- good rom. Rt knee- very tender to palpation presently.     Assessment & Plan:  On review of history it appears that you need bilateral knee replacements per orthopedics.  Right knee is more tender than left knee recently.  Blood pressure is stable presently.  Continue current BP medication.  Your EKG showed sinus rhythm.  Will get CBC and CMP as preop labs.  Also asked that she bring the form up so I can review to see if any other labs are requested.  When labs are back will decide on signing off on preop/clearance.  Hx of mild elevated sugar. Will add a1c as well.  Follow-up as regular scheduled with PCP or as needed.

## 2020-05-13 NOTE — Patient Instructions (Addendum)
On review of history it appears that you need bilateral knee replacements per orthopedics.  Right knee is more tender than left knee recently.  Blood pressure is stable presently.  Continue current BP medication.  Your EKG showed sinus rhythm.  Will get CBC and CMP as preop labs.  Also asked that she bring the form up so I can review to see if any other labs are requested.  When labs are back will decide on signing off on preop/clearance.  Hx of mild elevated sugar. Will add a1c as well.  Follow-up as regular scheduled with PCP or as needed.

## 2020-05-13 NOTE — Telephone Encounter (Signed)
Pt dropped off document to be filled out by provider (clearance for surgery document 2 pages) Pt would like document to be faxed when ready. Document given directly to provider.

## 2020-05-14 ENCOUNTER — Encounter: Payer: Self-pay | Admitting: Medical

## 2020-05-14 ENCOUNTER — Other Ambulatory Visit: Payer: Self-pay | Admitting: Family Medicine

## 2020-05-14 DIAGNOSIS — E559 Vitamin D deficiency, unspecified: Secondary | ICD-10-CM

## 2020-05-15 ENCOUNTER — Other Ambulatory Visit (INDEPENDENT_AMBULATORY_CARE_PROVIDER_SITE_OTHER): Payer: 59

## 2020-05-15 DIAGNOSIS — R739 Hyperglycemia, unspecified: Secondary | ICD-10-CM | POA: Diagnosis not present

## 2020-05-15 LAB — HEMOGLOBIN A1C: Hgb A1c MFr Bld: 5.6 % (ref 4.6–6.5)

## 2020-05-18 ENCOUNTER — Encounter: Payer: Self-pay | Admitting: Family Medicine

## 2020-05-18 ENCOUNTER — Telehealth: Payer: Self-pay | Admitting: Medical

## 2020-05-18 DIAGNOSIS — Z01818 Encounter for other preprocedural examination: Secondary | ICD-10-CM

## 2020-05-18 NOTE — Telephone Encounter (Signed)
I was filling out pt preop risk assessment. I saw   That surgeon  wanted her to have PT/INR. Did not see that on day I reviewed her form. Also saw that on form it states bmi needs to be less that 40. Her bmi was 40.66 according to calculation based on her height and weight.   I will put in future INR. Please get her scheduled this week. About her weight I would ask her to weigh herself first thing in morning without clothes. Her weight may be 2 lbs or less than it was here. Here if office clothed adds weight. If she update me on weight will change that in epic.   I am not sure how strict ortho surgeon is on weight

## 2020-05-18 NOTE — Telephone Encounter (Signed)
Patient has an appointment with PCP on 3/24

## 2020-05-19 ENCOUNTER — Other Ambulatory Visit: Payer: 59

## 2020-05-19 ENCOUNTER — Telehealth: Payer: Self-pay | Admitting: Medical

## 2020-05-19 NOTE — Telephone Encounter (Signed)
Filled out pt physical exam form/preop form. Placed it on your key board. Will you fax that to her surgeon office.

## 2020-05-19 NOTE — Addendum Note (Signed)
Addended by: Manuela Schwartz on: 05/19/2020 08:07 AM   Modules accepted: Orders

## 2020-05-20 ENCOUNTER — Encounter: Payer: Self-pay | Admitting: Family Medicine

## 2020-05-20 NOTE — Telephone Encounter (Signed)
Form faxed

## 2020-05-21 ENCOUNTER — Encounter: Payer: Self-pay | Admitting: Medical

## 2020-05-21 ENCOUNTER — Other Ambulatory Visit: Payer: Self-pay

## 2020-05-21 ENCOUNTER — Other Ambulatory Visit (INDEPENDENT_AMBULATORY_CARE_PROVIDER_SITE_OTHER): Payer: 59

## 2020-05-21 ENCOUNTER — Ambulatory Visit: Payer: 59 | Admitting: Family Medicine

## 2020-05-21 DIAGNOSIS — Z01818 Encounter for other preprocedural examination: Secondary | ICD-10-CM

## 2020-05-21 LAB — PROTIME-INR
INR: 1 ratio (ref 0.8–1.0)
Prothrombin Time: 11.6 s (ref 9.6–13.1)

## 2020-05-21 NOTE — Addendum Note (Signed)
Addended by: Manuela Schwartz on: 05/21/2020 09:33 AM   Modules accepted: Orders

## 2020-05-21 NOTE — Telephone Encounter (Signed)
9:30 was not available but put her in 9:45 today

## 2020-06-11 ENCOUNTER — Encounter: Payer: Self-pay | Admitting: Family Medicine

## 2020-06-19 ENCOUNTER — Other Ambulatory Visit: Payer: Self-pay

## 2020-06-19 MED ORDER — LISINOPRIL 20 MG PO TABS: 20.0000 mg | ORAL_TABLET | Freq: Two times a day (BID) | ORAL | 3 refills | Status: DC

## 2020-06-19 NOTE — Telephone Encounter (Signed)
Pt is aware of instructions and script was sent

## 2020-06-24 ENCOUNTER — Other Ambulatory Visit: Payer: Self-pay

## 2020-06-24 ENCOUNTER — Encounter: Payer: Self-pay | Admitting: Family Medicine

## 2020-06-24 ENCOUNTER — Ambulatory Visit: Payer: 59 | Admitting: Family Medicine

## 2020-06-24 ENCOUNTER — Other Ambulatory Visit: Payer: Self-pay | Admitting: Family Medicine

## 2020-06-24 VITALS — BP 144/90 | HR 86 | Temp 98.1°F | Ht 64.0 in | Wt 238.0 lb

## 2020-06-24 DIAGNOSIS — I10 Essential (primary) hypertension: Secondary | ICD-10-CM

## 2020-06-24 MED ORDER — AMLODIPINE BESYLATE 2.5 MG PO TABS: 2.5000 mg | ORAL_TABLET | Freq: Every day | ORAL | 3 refills | Status: DC

## 2020-06-24 MED ORDER — HYDROCHLOROTHIAZIDE 25 MG PO TABS
12.5000 mg | ORAL_TABLET | Freq: Every day | ORAL | 3 refills | Status: DC
Start: 2020-06-24 — End: 2020-10-22

## 2020-06-24 NOTE — Patient Instructions (Addendum)
Start hydrochlorothiazide - either half or whole tablet- in the am for your BP Continue lisinopril as you currently are  Our goal for your BP is under 130/85  We may or may not need to continue the hctz long term- your BP may get back to normal once you are out of the acute post-operative phase   Please see me in 4-6 weeks so we can check on your labs.  Please keep [place] posted

## 2020-06-24 NOTE — Progress Notes (Signed)
[facility] at [facility] [address] [place], [place] [address] [phone]  Date:  06/24/2020   Name:  ZARINA PE   DOB:  23-Aug-1970   MRN:  [id number]  PCP:  Mosie Lukes, MD    Chief Complaint: Hypertension (Has been up since surgery 3 weeks ago )   History of Present Illness:  JESSIKAH DICKER is a 50 y.o. very pleasant female patient who presents with the following:  Primary pt of Dr Si Gaul  She had a right knee replacement 3 weeks ago She is overall doing ok and pain is reasonable  Today she was doing her PT- on the bike- and her BP was found to be high. Her therapist asked her to come in for eval   She is checking her BP at home on a regular basis  She has been on lisionpril 10 BID for several years However as of late her BP has been higher- she called and her PCP recommended that she go up to 20 mg BID which she has been doing for about 5 days   She has been tapering off her pain meds since surgery and stopped taking them  They did start her on celebrex after her surgery but she was on mobic before The left knee is still painful as she needs this one replaced as well   She uses typlenol prn   She has been on BP meds for a couple of years She did have Pre-eclampsia with her 3rd baby   Labs on chart from last month- normal renal function   BP Readings from Last 3 Encounters:  06/24/20 (!) 144/90  05/13/20 135/84  04/08/20 136/84     Patient Active Problem List   Diagnosis Date Noted  . Hyperglycemia 11/23/2019  . Hyperlipidemia 03/02/2017  . IBS (irritable bowel syndrome) 08/15/2016  . Vegetarianism 08/15/2016  . Hearing loss 08/15/2016  . Sleep apnea 08/15/2016  . Leg pain, left 02/09/2016  . Vitamin D deficiency 08/16/2015  . Preventative health care 01/28/2015  . Obesity 11/19/2014  . Shoulder pain, bilateral 08/20/2014  . Large breasts 08/20/2014  . Dry eye of right side 10/28/2013  .  Palpitations 09/26/2013  . Sebaceous cyst 06/27/2013  . GERD (gastroesophageal reflux disease) 08/07/2012  . Ventral hernia 04/23/2012  . Hypoactive sexual desire 04/06/2012  . Endometriosis 09/14/2011  . Pelvic pain in female 09/14/2011  . Back pain 08/28/2011  . Family history of ovarian cancer 08/28/2011  . Fibromyalgia 06/29/2011  . Phlebitis and thrombophlebitis of superficial veins of upper extremities 04/07/2011  . Axillary pain 03/02/2011  . DIZZINESS 12/08/2009  . Arthralgia 11/19/2008  . Muscle cramps 11/19/2008  . Low back pain 09/19/2008  . PARESTHESIA 09/19/2008  . ALLERGIC RHINITIS 05/08/2008  . Melanoma of skin ([facility]) 05/07/2008  . Essential hypertension 05/07/2008  . HEMORRHOIDS 05/07/2008  . Headache 05/07/2008  . H/O amenorrhea 10/30/2006    Past Medical History:  Diagnosis Date  . Acute mastitis of left breast 10/04/2007  . Allergy    seasonal allergies  . AMA (advanced maternal age) multigravida 26+   . Cancer ([facility]) 2002   Left leg melanoma  . Celiac disease   . Chronic back pain    H/O  . Complication of anesthesia    Difficult time waking up  . Constipation in pregnancy 03/2006  . Endometriosis    h/o  . Environmental allergies   . Fibroid  During first pregnancy  . Fibromyalgia    pt denies dx  . First trimester bleeding 2008  . GBS carrier   . GERD (gastroesophageal reflux disease)    with certain foods-OTC PRN  . Granuloma annulare   . H/O amenorrhea 10/2006  . H/O blood clots    7 yr ago in arm  . H/O varicella    As an infant.  . Headache(784.0)   . Hearing loss 08/15/2016  . Heart murmur    heard occassional at MD office  . HTN (hypertension)    on meds  . Hyperlipidemia 03/02/2017   diet controlled  . IBS (irritable bowel syndrome) 08/15/2016  . Irregular uterine bleeding   . Joint pain   . Lactose intolerance   . Leg edema   . Leg pain, left 02/09/2016  . Monilial vaginitis 07/27/04  . Palpitations   . Rh negative status  during pregnancy   . SAB (spontaneous abortion)    h/o x 2  . Seasonal allergies   . Sleep apnea 08/15/2016   no CPAP use- uses nightguard  . TMJ syndrome   . Vegetarianism 08/15/2016  . Vitamin D deficiency 08/16/2015    Past Surgical History:  Procedure Laterality Date  . ABDOMINAL HYSTERECTOMY  2013  . APPENDECTOMY  2002  . DILATION AND EVACUATION    . endocervical polyp removal  2001  . ENDOMETRIAL ABLATION  2010  . HERNIA REPAIR  05/16/12   incisional hernia repair  . HYSTEROSCOPY W/ ENDOMETRIAL ABLATION  01/28/2009  . INSERTION OF MESH N/A 05/16/2012   Procedure: INSERTION OF MESH;  Surgeon: Joyice Faster. Cornett, MD;  Location: [facility];  Service: General;  Laterality: N/A;  . KNEE ARTHROSCOPY Left 12/05/2012   Procedure: LEFT KNEE ARTHROSCOPY;  Surgeon: Kerin Salen, MD;  Location: [facility];  Service: Orthopedics;  Laterality: Left;  [name] MELANOMA EXCISION  2002   Left Leg  . melanoma removal   2003  . SALPINGOOPHORECTOMY  12/08/2011   Procedure: SALPINGO OOPHERECTOMY;  Surgeon: Eldred Manges, MD;  Location: [facility];  Service: Gynecology;  Laterality: Bilateral;  . TENDON RELEASE  2004  . uterine curettage  01/2009  . VENTRAL HERNIA REPAIR N/A 05/16/2012   Procedure: LAPAROSCOPIC VENTRAL HERNIA;  Surgeon: Joyice Faster. Cornett, MD;  Location: [facility];  Service: General;  Laterality: N/A;  . WISDOM TOOTH EXTRACTION  1989    Social History   Tobacco Use  . Smoking status: Former Smoker    Types: Cigarettes    Quit date: 04/26/2020    Years since quitting: 0.1  . Smokeless tobacco: Never Used  . Tobacco comment: 1-3 cigarrettes per week  Vaping Use  . Vaping Use: Never used  Substance Use Topics  . Alcohol use: No  . Drug use: No    Family History  Problem Relation Age of Onset  . Hypertension Mother   . Heart disease Mother   . Anxiety disorder Mother   . Breast cancer Mother 51  . Heart disease Father        atrial fib  . Hypertension Father   .  Atrial fibrillation Father   . Obesity Father   . Bladder Cancer Father 58  . Lymphoma Father 30  . Breast cancer Maternal Grandmother 63  . Heart disease Maternal Grandfather        MI  . Heart disease Paternal Grandfather        MI  . Ovarian cancer Maternal Aunt 33  .  Diverticulitis Brother   . Colon cancer Neg Hx   . Colon polyps Neg Hx   . Esophageal cancer Neg Hx   . Rectal cancer Neg Hx   . Stomach cancer Neg Hx     Allergies  Allergen Reactions  . Cymbalta [Duloxetine Hcl] Other (See Comments)    Makes her feel crazy, out of body  . Betadine [Povidone Iodine] Other (See Comments)    Dx with allergy testing  . Iodine   . Shellfish Allergy Other (See Comments)    Dx with allergy testing    Medication list has been reviewed and updated.  Current Outpatient Medications on File Prior to Visit  Medication Sig Dispense Refill  . acetaminophen (TYLENOL) 325 MG tablet Take 650 mg by mouth every 6 (six) hours as needed.    [name] aspirin EC 81 MG tablet Take 1 tablet twice daily for 2 weeks and then once daily for 2 weeks    . Cetirizine HCl (ZYRTEC PO) Take 1 tablet by mouth daily.    [name] estradiol (CLIMARA - DOSED IN MG/24 HR) 0.0375 mg/24hr patch Place 0.0375 mg onto the skin once a week.    [name] lisinopril (ZESTRIL) 20 MG tablet Take 1 tablet (20 mg total) by mouth in the morning and at bedtime. 180 tablet 3  . meloxicam (MOBIC) 15 MG tablet Take 15 mg by mouth daily.    . Multiple Vitamins-Minerals (MULTIVITAMIN ADULT PO) Take by mouth.    . Vitamin D, Ergocalciferol, (DRISDOL) 1.25 MG (50000 UNIT) CAPS capsule Take 1 capsule (50,000 Units total) by mouth every 7 (seven) days. 12 capsule 0  . Misc. Devices (DENTAL GUARD) MISC 1 Device by Does not apply route See admin instructions. 1 each 0   No current facility-administered medications on file prior to visit.    Review of Systems:  As per HPI- otherwise negative.   Physical Examination: Vitals:   06/24/20 1434 06/24/20  1438  BP: (!) 150/98 (!) 144/90  Pulse: 86   Temp: 98.1 F (36.7 C)   SpO2: 97%    Vitals:   06/24/20 1434  Weight: 238 lb (108 kg)  Height: 5\' 4"  (1.626 m)   Body mass index is 40.85 kg/m. Ideal Body Weight: Weight in (lb) to have BMI = 25: 145.3  GEN: no acute distress, obese, looks well  HEENT: Atraumatic, Normocephalic.  Ears and Nose: No external deformity. CV: RRR, No M/G/R. No JVD. No thrill. No extra heart sounds. PULM: CTA B, no wheezes, crackles, rhonchi. No retractions. No resp. distress. No accessory muscle use. ABD: S, NT, ND. No rebound. No HSM. EXTR: No c/c/e PSYCH: Normally interactive. Conversant.  S/p recent right knee replacement   Assessment and Plan: Hypertension, unspecified type - Plan: hydrochlorothiazide (HYDRODIURIL) 25 MG tablet  Here today for BP elevation Continue lisinopril 40 daily Add hctz 12.5- 25 mg daily She will continue to monitor BP, goal less than 130/85 She will come in and see Dr B or myself in about a month for a recheck and labs This visit occurred during the SARS-CoV-2 public health emergency.  Safety protocols were in place, including screening questions prior to the visit, additional usage of staff PPE, and extensive cleaning of exam room while observing appropriate contact time as indicated for disinfecting solutions.     Signed Lamar Blinks, MD

## 2020-06-26 ENCOUNTER — Telehealth: Payer: Self-pay

## 2020-06-26 NOTE — Telephone Encounter (Signed)
See phone encounter from today

## 2020-06-26 NOTE — Telephone Encounter (Signed)
Lvm to call back and set up a lab appointment in 2 weeks. Pt needs a CMP for high blood pressure.

## 2020-07-08 ENCOUNTER — Other Ambulatory Visit (INDEPENDENT_AMBULATORY_CARE_PROVIDER_SITE_OTHER): Payer: 59

## 2020-07-08 ENCOUNTER — Other Ambulatory Visit: Payer: Self-pay

## 2020-07-08 DIAGNOSIS — E559 Vitamin D deficiency, unspecified: Secondary | ICD-10-CM | POA: Diagnosis not present

## 2020-07-08 LAB — VITAMIN D 25 HYDROXY (VIT D DEFICIENCY, FRACTURES): VITD: 17.6 ng/mL — ABNORMAL LOW (ref 30.00–100.00)

## 2020-07-09 ENCOUNTER — Telehealth: Payer: Self-pay | Admitting: Family Medicine

## 2020-07-09 ENCOUNTER — Encounter: Payer: Self-pay | Admitting: Family Medicine

## 2020-07-09 ENCOUNTER — Other Ambulatory Visit (INDEPENDENT_AMBULATORY_CARE_PROVIDER_SITE_OTHER): Payer: 59

## 2020-07-09 ENCOUNTER — Other Ambulatory Visit: Payer: Self-pay

## 2020-07-09 DIAGNOSIS — I10 Essential (primary) hypertension: Secondary | ICD-10-CM

## 2020-07-09 DIAGNOSIS — E559 Vitamin D deficiency, unspecified: Secondary | ICD-10-CM

## 2020-07-09 LAB — COMPREHENSIVE METABOLIC PANEL
ALT: 24 U/L (ref 0–35)
AST: 16 U/L (ref 0–37)
Albumin: 4.5 g/dL (ref 3.5–5.2)
Alkaline Phosphatase: 65 U/L (ref 39–117)
BUN: 16 mg/dL (ref 6–23)
CO2: 29 mEq/L (ref 19–32)
Calcium: 10.1 mg/dL (ref 8.4–10.5)
Chloride: 102 mEq/L (ref 96–112)
Creatinine, Ser: 0.54 mg/dL (ref 0.40–1.20)
GFR: 106.51 mL/min (ref >60.00)
Glucose, Bld: 119 mg/dL — ABNORMAL HIGH (ref 70–99)
Potassium: 5.1 mEq/L (ref 3.5–5.1)
Sodium: 141 mEq/L (ref 135–145)
Total Bilirubin: 0.4 mg/dL (ref 0.2–1.2)
Total Protein: 6.9 g/dL (ref 6.0–8.3)

## 2020-07-09 MED ORDER — VITAMIN D (ERGOCALCIFEROL) 1.25 MG (50000 UNIT) PO CAPS: 50000.0000 [IU] | ORAL_CAPSULE | ORAL | 0 refills | Status: DC

## 2020-07-09 NOTE — Telephone Encounter (Signed)
See labs 

## 2020-07-09 NOTE — Progress Notes (Signed)
Received message- an CMP was added on.  Message sent to pt

## 2020-07-09 NOTE — Telephone Encounter (Signed)
Patient calling back for lab results °

## 2020-07-10 ENCOUNTER — Other Ambulatory Visit: Payer: 59

## 2020-07-29 ENCOUNTER — Ambulatory Visit: Payer: 59 | Admitting: Family Medicine

## 2020-07-30 ENCOUNTER — Encounter: Payer: Self-pay | Admitting: Family Medicine

## 2020-07-30 ENCOUNTER — Other Ambulatory Visit: Payer: Self-pay

## 2020-07-30 ENCOUNTER — Ambulatory Visit: Payer: 59 | Admitting: Family Medicine

## 2020-07-30 VITALS — BP 122/74 | HR 89 | Temp 98.1°F | Resp 16 | Wt 234.6 lb

## 2020-07-30 DIAGNOSIS — E559 Vitamin D deficiency, unspecified: Secondary | ICD-10-CM

## 2020-07-30 DIAGNOSIS — Z01818 Encounter for other preprocedural examination: Secondary | ICD-10-CM

## 2020-07-30 DIAGNOSIS — R739 Hyperglycemia, unspecified: Secondary | ICD-10-CM | POA: Diagnosis not present

## 2020-07-30 DIAGNOSIS — Z1231 Encounter for screening mammogram for malignant neoplasm of breast: Secondary | ICD-10-CM

## 2020-07-30 DIAGNOSIS — Z789 Other specified health status: Secondary | ICD-10-CM

## 2020-07-30 DIAGNOSIS — I10 Essential (primary) hypertension: Secondary | ICD-10-CM

## 2020-07-30 DIAGNOSIS — E785 Hyperlipidemia, unspecified: Secondary | ICD-10-CM

## 2020-07-30 DIAGNOSIS — R000 Tachycardia, unspecified: Secondary | ICD-10-CM

## 2020-07-30 NOTE — Assessment & Plan Note (Signed)
hgba1c acceptable, minimize simple carbs. Increase exercise as tolerated.  

## 2020-07-30 NOTE — Assessment & Plan Note (Signed)
Well controlled, no changes to meds. Encouraged heart healthy diet such as the DASH diet and exercise as tolerated.  °

## 2020-07-30 NOTE — Progress Notes (Signed)
Patient ID: [name], female    DOB: Jan 18, 1971  Age: 50 y.o. MRN: [id number]    Subjective:  Subjective  HPI [name] presents for office visit today for follow up on HTN and GERD. She reports that she is doing well and has no new sicknesses or recent ER visits to report. However, she expresses concern regarding her elevated bpm while resting, especially due to upcoming knee surgery she has planned. She states that her smart watch has alerted her of elevated heart rate while sitting, which added to her concern. She denies any chest pain, SOB, fever, abdominal pain, cough, chills, sore throat, dysuria, urinary incontinence, back pain, HA, or N/VD. She states that she takes her BP readings at least once a day and reports that last Tuesday morning it was 145/100. She reports FMHx of heart disease and HTN from her mother and dad. She states that her dad had an ablation procedure and her mother has had tachycardia at night during sleep that was controlled eventually with medication. She states that both her parents were on BP medications. She endorses taking 1-2 tablets of 2000 units of vitamin D. She expresses interest in waning off pain medications like Meloxicam and NSAIDS.   Review of Systems  Constitutional: Negative for chills, fatigue and fever.  HENT: Negative for congestion, rhinorrhea, sinus pressure, sinus pain and sore throat.   Eyes: Negative for pain.  Respiratory: Negative for cough and shortness of breath.   Cardiovascular: Negative for chest pain, palpitations and leg swelling.  Gastrointestinal: Negative for abdominal pain, blood in stool, diarrhea, nausea and vomiting.  Genitourinary: Negative for decreased urine volume, flank pain, frequency, vaginal bleeding and vaginal discharge.  Musculoskeletal: Negative for back pain.  Neurological: Negative for headaches.    History Past Medical History:  Diagnosis Date  . Acute mastitis of left breast 10/04/2007  . Allergy     seasonal allergies  . AMA (advanced maternal age) multigravida 24+   . Cancer ([place]) 2002   Left leg melanoma  . Celiac disease   . Chronic back pain    H/O  . Complication of anesthesia    Difficult time waking up  . Constipation in pregnancy 03/2006  . Endometriosis    h/o  . Environmental allergies   . Fibroid    During first pregnancy  . Fibromyalgia    pt denies dx  . First trimester bleeding 2008  . GBS carrier   . GERD (gastroesophageal reflux disease)    with certain foods-OTC PRN  . Granuloma annulare   . H/O amenorrhea 10/2006  . H/O blood clots    7 yr ago in arm  . H/O varicella    As an infant.  . Headache(784.0)   . Hearing loss 08/15/2016  . Heart murmur    heard occassional at MD office  . HTN (hypertension)    on meds  . Hyperlipidemia 03/02/2017   diet controlled  . IBS (irritable bowel syndrome) 08/15/2016  . Irregular uterine bleeding   . Joint pain   . Lactose intolerance   . Leg edema   . Leg pain, left 02/09/2016  . Monilial vaginitis 07/27/04  . Palpitations   . Rh negative status during pregnancy   . SAB (spontaneous abortion)    h/o x 2  . Seasonal allergies   . Sleep apnea 08/15/2016   no CPAP use- uses nightguard  . TMJ syndrome   . Vegetarianism 08/15/2016  . Vitamin D deficiency 08/16/2015  She has a past surgical history that includes Wisdom tooth extraction (1989); Appendectomy (2002); endocervical polyp removal (2001); melanoma removal  (2003); uterine curettage (01/2009); Endometrial ablation (2010); Hysteroscopy w/ endometrial ablation (01/28/2009); Tendon release (2004); Salpingoophorectomy (12/08/2011); Dilation and evacuation; Abdominal hysterectomy (2013); Melanoma excision (2002); Hernia repair (05/16/12); Ventral hernia repair (N/A, 05/16/2012); Insertion of mesh (N/A, 05/16/2012); and Knee arthroscopy (Left, 12/05/2012).   Her family history includes Anxiety disorder in her mother; Atrial fibrillation in her father; Bladder  Cancer (age of onset: 81) in her father; Breast cancer (age of onset: 35) in her mother; Breast cancer (age of onset: 37) in her maternal grandmother; Diverticulitis in her brother; Heart disease in her father, maternal grandfather, mother, and paternal grandfather; Hypertension in her father and mother; Lymphoma (age of onset: 79) in her father; Obesity in her father; Ovarian cancer (age of onset: 48) in her maternal aunt.She reports that she quit smoking about 3 months ago. Her smoking use included cigarettes. She has never used smokeless tobacco. She reports that she does not drink alcohol and does not use drugs.  Current Outpatient Medications on File Prior to Visit  Medication Sig Dispense Refill  . Cetirizine HCl (ZYRTEC PO) Take 1 tablet by mouth daily.    [name] estradiol (CLIMARA - DOSED IN MG/24 HR) 0.0375 mg/24hr patch Place 0.0375 mg onto the skin once a week.    . hydrochlorothiazide (HYDRODIURIL) 25 MG tablet Take 0.5-1 tablets (12.5-25 mg total) by mouth daily. 30 tablet 3  . lisinopril (ZESTRIL) 20 MG tablet Take 1 tablet (20 mg total) by mouth in the morning and at bedtime. 180 tablet 3  . meloxicam (MOBIC) 15 MG tablet Take 15 mg by mouth daily.    . Misc. Devices (DENTAL GUARD) MISC 1 Device by Does not apply route See admin instructions. 1 each 0  . Multiple Vitamins-Minerals (MULTIVITAMIN ADULT PO) Take by mouth.    . Vitamin D, Ergocalciferol, (DRISDOL) 1.25 MG (50000 UNIT) CAPS capsule Take 1 capsule (50,000 Units total) by mouth every 7 (seven) days. 12 capsule 0   No current facility-administered medications on file prior to visit.     Objective:  Objective  Physical Exam Constitutional:      General: She is not in acute distress.    Appearance: Normal appearance. She is not ill-appearing or toxic-appearing.  HENT:     Head: Normocephalic and atraumatic.     Right Ear: Tympanic membrane, ear canal and external ear normal.     Left Ear: Tympanic membrane, ear canal and  external ear normal.     Nose: No congestion or rhinorrhea.  Eyes:     Extraocular Movements: Extraocular movements intact.     Right eye: No nystagmus.     Left eye: No nystagmus.     Pupils: Pupils are equal, round, and reactive to light.  Cardiovascular:     Rate and Rhythm: Normal rate and regular rhythm.     Pulses: Normal pulses.     Heart sounds: Normal heart sounds. No murmur heard.   Pulmonary:     Effort: Pulmonary effort is normal. No respiratory distress.     Breath sounds: Normal breath sounds. No wheezing, rhonchi or rales.  Abdominal:     General: Bowel sounds are normal.     Palpations: Abdomen is soft. There is no mass.     Tenderness: There is no abdominal tenderness. There is no guarding.     Hernia: No hernia is present.  Musculoskeletal:  General: Normal range of motion.     Cervical back: Normal range of motion and neck supple.  Skin:    General: Skin is warm and dry.  Neurological:     Mental Status: She is alert and oriented to person, place, and time.  Psychiatric:        Behavior: Behavior normal.    BP 122/74   Pulse 89   Temp 98.1 F (36.7 C)   Resp 16   Wt 234 lb 9.6 oz (106.4 kg)   LMP 02/22/2011   SpO2 97%   BMI 40.27 kg/m  Wt Readings from Last 3 Encounters:  07/30/20 234 lb 9.6 oz (106.4 kg)  06/24/20 238 lb (108 kg)  05/13/20 237 lb (107.5 kg)     Lab Results  Component Value Date   WBC 7.6 05/13/2020   HGB 14.1 05/13/2020   HCT 39.7 05/13/2020   PLT 330.0 05/13/2020   GLUCOSE 119 (H) 07/09/2020   CHOL 154 11/21/2019   TRIG 89 11/21/2019   HDL 50 11/21/2019   LDLCALC 86 11/21/2019   ALT 24 07/09/2020   AST 16 07/09/2020   NA 141 07/09/2020   K 5.1 07/09/2020   CL 102 07/09/2020   CREATININE 0.54 07/09/2020   BUN 16 07/09/2020   CO2 29 07/09/2020   TSH 1.47 11/21/2019   INR 1.0 05/21/2020   HGBA1C 5.6 05/15/2020    MR KNEE RIGHT WO CONTRAST  Result Date: 05/08/2020 CLINICAL DATA:  Left knee pain for 2  months EXAM: MRI OF THE RIGHT KNEE WITHOUT CONTRAST TECHNIQUE: Multiplanar, multisequence MR imaging of the knee was performed. No intravenous contrast was administered. COMPARISON:  X-ray 12/13/2018 FINDINGS: MENISCI Medial meniscus: Complete radial tear of the posterior horn of the medial meniscus just medial to the posterior root attachment site (series 6, image 13). Intrasubstance degeneration of the medial meniscal body, which is medially extruded. Lateral meniscus:  Intact. LIGAMENTS Cruciates:  Intact ACL and PCL. Collaterals: Intact MCL with mild periligamentous edema, which may be reactive secondary to adjacent meniscal pathology. Lateral collateral ligament complex intact. CARTILAGE Patellofemoral: Mild-moderate chondral thinning and surface irregularity most pronounced within the trochlear groove. Medial: Moderate diffuse cartilage thinning and surface irregularity of the weight-bearing medial compartment with areas of full-thickness chondral fissuring and subchondral marrow edema. Lateral:  No chondral defect. Joint: Small-moderate knee joint effusion. Fat pads within normal limits. Popliteal Fossa: Small complex Baker's cyst with inferior leakage/rupture. Intact popliteus tendon. Extensor Mechanism:  Intact quadriceps tendon and patellar tendon. Bones: Medial compartment joint space narrowing with subchondral marrow edema at the peripheral aspects of the medial femoral condyle and medial tibial plateau. No discernible subchondral fracture line. Remaining osseous structures within normal limits. No suspicious bone lesion. Other: Nonspecific circumferential subcutaneous edema. IMPRESSION: 1. Complete radial tear of the posterior horn of the medial meniscus adjacent to the posterior root attachment site. 2. Grade 1 MCL sprain. 3. Moderate medial compartment osteoarthritis. 4. Small-to-moderate knee joint effusion. Small complex Baker's cyst with inferior leakage/rupture. Electronically Signed   By:  Davina Poke D.O.   On: 05/08/2020 09:14     Assessment & Plan:  Plan    No orders of the defined types were placed in this encounter.   Problem List Items Addressed This Visit    Essential hypertension    Well controlled, no changes to meds. Encouraged heart healthy diet such as the DASH diet and exercise as tolerated.       Relevant Orders   CBC  Comprehensive metabolic panel   TSH   Vitamin D deficiency    Supplement and monitor      Relevant Orders   VITAMIN D 25 Hydroxy (Vit-D Deficiency, Fractures)   Vegetarianism    Continue multivitamin and monitor labs      Relevant Orders   Vitamin B12   Hyperlipidemia    Encouraged heart healthy diet, increase exercise, avoid trans fats, consider a krill oil cap daily      Relevant Orders   Lipid panel   Hyperglycemia    hgba1c acceptable, minimize simple carbs. Increase exercise as tolerated.       Tachycardia    She has been noting more episodes of mild tachycardia recently in the low 100s and she is concerned about the fact that over time both of her parents developed tachycardia and ultimately afib. She will report worsening symptoms, increase activity as tolerated. Minimize alcohol and stimulants and continue to monitor for now.        Other Visit Diagnoses    Hypertension, unspecified type    -  Primary   Relevant Orders   Comprehensive metabolic panel   Encounter for screening mammogram for malignant neoplasm of breast       Relevant Orders   MM Digital Screening   Preop testing       Relevant Orders   Protime-INR      Follow-up: Return in about 14 weeks (around 11/05/2020), or lab appt 9/6 then a f/u visit later that week.   I,David Hanna,acting as a scribe for Penni Homans, MD.,have documented all relevant documentation on the behalf of Penni Homans, MD,as directed by  Penni Homans, MD while in the presence of Penni Homans, MD.  I, Mosie Lukes, MD personally performed the services described in  this documentation. All medical record entries made by the scribe were at my direction and in my presence. I have reviewed the chart and agree that the record reflects my personal performance and is accurate and complete

## 2020-07-30 NOTE — Patient Instructions (Signed)

## 2020-07-30 NOTE — Assessment & Plan Note (Signed)
Supplement and monitor 

## 2020-07-30 NOTE — Assessment & Plan Note (Signed)
Encouraged heart healthy diet, increase exercise, avoid trans fats, consider a krill oil cap daily 

## 2020-08-02 DIAGNOSIS — R000 Tachycardia, unspecified: Secondary | ICD-10-CM | POA: Insufficient documentation

## 2020-08-02 NOTE — Assessment & Plan Note (Signed)
She has been noting more episodes of mild tachycardia recently in the low 100s and she is concerned about the fact that over time both of her parents developed tachycardia and ultimately afib. She will report worsening symptoms, increase activity as tolerated. Minimize alcohol and stimulants and continue to monitor for now.

## 2020-08-02 NOTE — Assessment & Plan Note (Signed)
Continue multivitamin and monitor labs

## 2020-08-14 ENCOUNTER — Encounter: Payer: Self-pay | Admitting: Family Medicine

## 2020-10-15 ENCOUNTER — Other Ambulatory Visit: Payer: Self-pay | Admitting: Family Medicine

## 2020-10-15 DIAGNOSIS — E559 Vitamin D deficiency, unspecified: Secondary | ICD-10-CM

## 2020-10-19 ENCOUNTER — Other Ambulatory Visit: Payer: Self-pay | Admitting: Family Medicine

## 2020-10-19 DIAGNOSIS — E559 Vitamin D deficiency, unspecified: Secondary | ICD-10-CM

## 2020-10-20 NOTE — Telephone Encounter (Signed)
Pt called there was no answer. She is to start the vit D otc. I have left message to call back.

## 2020-10-20 NOTE — Telephone Encounter (Signed)
Pt called and left Vm to call back.

## 2020-10-22 ENCOUNTER — Other Ambulatory Visit: Payer: Self-pay | Admitting: Family Medicine

## 2020-10-22 DIAGNOSIS — I10 Essential (primary) hypertension: Secondary | ICD-10-CM

## 2020-11-03 ENCOUNTER — Other Ambulatory Visit: Payer: Self-pay

## 2020-11-03 ENCOUNTER — Encounter: Payer: Self-pay | Admitting: Family Medicine

## 2020-11-03 ENCOUNTER — Telehealth: Payer: Self-pay

## 2020-11-03 ENCOUNTER — Other Ambulatory Visit (INDEPENDENT_AMBULATORY_CARE_PROVIDER_SITE_OTHER): Payer: 59

## 2020-11-03 DIAGNOSIS — E785 Hyperlipidemia, unspecified: Secondary | ICD-10-CM | POA: Diagnosis not present

## 2020-11-03 DIAGNOSIS — Z01818 Encounter for other preprocedural examination: Secondary | ICD-10-CM

## 2020-11-03 DIAGNOSIS — Z789 Other specified health status: Secondary | ICD-10-CM | POA: Diagnosis not present

## 2020-11-03 DIAGNOSIS — E559 Vitamin D deficiency, unspecified: Secondary | ICD-10-CM | POA: Diagnosis not present

## 2020-11-03 DIAGNOSIS — R739 Hyperglycemia, unspecified: Secondary | ICD-10-CM

## 2020-11-03 DIAGNOSIS — I10 Essential (primary) hypertension: Secondary | ICD-10-CM

## 2020-11-03 LAB — CBC
HCT: 42.3 % (ref 36.0–46.0)
Hemoglobin: 14.3 g/dL (ref 12.0–15.0)
MCHC: 33.8 g/dL (ref 30.0–36.0)
MCV: 86.3 fl (ref 78.0–100.0)
Platelets: 379 10*3/uL (ref 150.0–400.0)
RBC: 4.9 Mil/uL (ref 3.87–5.11)
RDW: 14.5 % (ref 11.5–15.5)
WBC: 6.3 10*3/uL (ref 4.0–10.5)

## 2020-11-03 LAB — COMPREHENSIVE METABOLIC PANEL
ALT: 28 U/L (ref 0–35)
AST: 15 U/L (ref 0–37)
Albumin: 4.3 g/dL (ref 3.5–5.2)
Alkaline Phosphatase: 65 U/L (ref 39–117)
BUN: 12 mg/dL (ref 6–23)
CO2: 30 mEq/L (ref 19–32)
Calcium: 10.1 mg/dL (ref 8.4–10.5)
Chloride: 99 mEq/L (ref 96–112)
Creatinine, Ser: 0.54 mg/dL (ref 0.40–1.20)
GFR: 106.27 mL/min (ref >60.00)
Glucose, Bld: 114 mg/dL — ABNORMAL HIGH (ref 70–99)
Potassium: 4.4 mEq/L (ref 3.5–5.1)
Sodium: 136 mEq/L (ref 135–145)
Total Bilirubin: 0.6 mg/dL (ref 0.2–1.2)
Total Protein: 6.9 g/dL (ref 6.0–8.3)

## 2020-11-03 LAB — HEMOGLOBIN A1C: Hgb A1c MFr Bld: 5.7 % (ref 4.6–6.5)

## 2020-11-03 LAB — VITAMIN B12: Vitamin B-12: 317 pg/mL (ref 211–911)

## 2020-11-03 LAB — LIPID PANEL
Cholesterol: 161 mg/dL (ref 0–200)
HDL: 44.6 mg/dL (ref >39.00)
LDL Cholesterol: 91 mg/dL (ref 0–99)
NonHDL: 116.76
Total CHOL/HDL Ratio: 4
Triglycerides: 130 mg/dL (ref 0.0–149.0)
VLDL: 26 mg/dL (ref 0.0–40.0)

## 2020-11-03 LAB — PROTIME-INR
INR: 1 ratio (ref 0.8–1.0)
Prothrombin Time: 11.2 s (ref 9.6–13.1)

## 2020-11-03 LAB — VITAMIN D 25 HYDROXY (VIT D DEFICIENCY, FRACTURES): VITD: 31.62 ng/mL (ref 30.00–100.00)

## 2020-11-03 LAB — TSH: TSH: 1.53 u[IU]/mL (ref 0.35–5.50)

## 2020-11-03 NOTE — Telephone Encounter (Signed)
Patient called stating her physical is on 9/26 with Dr. Charlett Blake, however she is having surgery on 9/30 and they are requesting her surgical clearance be sooner than than. Her daughter also has a nephrology appointment with [facility] on the same day 9/26.   She is wondering if her CPE could be rescheduled to either 9/19 or 9/20. I advised her it may need to be with another provider due to availability. She said last visit when she did that Dr. Charlett Blake stated she needed to see her next time. Please advise and call patient back.   This would be for CPE and surgical clearance same day.

## 2020-11-05 ENCOUNTER — Ambulatory Visit: Payer: 59 | Admitting: Family Medicine

## 2020-11-05 NOTE — Telephone Encounter (Signed)
Pt is scheduled °

## 2020-11-17 ENCOUNTER — Ambulatory Visit: Payer: 59 | Admitting: Family Medicine

## 2020-11-17 ENCOUNTER — Encounter: Payer: Self-pay | Admitting: Family Medicine

## 2020-11-17 ENCOUNTER — Other Ambulatory Visit: Payer: Self-pay

## 2020-11-17 VITALS — BP 110/64 | HR 92 | Temp 98.5°F | Resp 16 | Ht 64.0 in | Wt 237.6 lb

## 2020-11-17 DIAGNOSIS — I10 Essential (primary) hypertension: Secondary | ICD-10-CM

## 2020-11-17 DIAGNOSIS — E559 Vitamin D deficiency, unspecified: Secondary | ICD-10-CM

## 2020-11-17 DIAGNOSIS — R739 Hyperglycemia, unspecified: Secondary | ICD-10-CM

## 2020-11-17 DIAGNOSIS — Z789 Other specified health status: Secondary | ICD-10-CM | POA: Diagnosis not present

## 2020-11-17 DIAGNOSIS — G8929 Other chronic pain: Secondary | ICD-10-CM

## 2020-11-17 DIAGNOSIS — M25562 Pain in left knee: Secondary | ICD-10-CM

## 2020-11-17 DIAGNOSIS — E785 Hyperlipidemia, unspecified: Secondary | ICD-10-CM

## 2020-11-17 DIAGNOSIS — Z0000 Encounter for general adult medical examination without abnormal findings: Secondary | ICD-10-CM

## 2020-11-17 NOTE — Assessment & Plan Note (Signed)
Patient encouraged to maintain heart healthy diet, regular exercise, adequate sleep. Consider daily probiotics. Take medications as prescribed 

## 2020-11-17 NOTE — Assessment & Plan Note (Signed)
Encourage heart healthy diet such as MIND or DASH diet, increase exercise, avoid trans fats, simple carbohydrates and processed foods, consider a krill or fish or flaxseed oil cap daily.  °

## 2020-11-17 NOTE — Progress Notes (Signed)
Patient ID: [name], female    DOB: [birth year]  Age: 50 y.o. MRN: [id number]    Subjective:   Chief Complaint  Patient presents with   Pre-op Exam   Subjective   HPI [name] presents for office visit today for follow up on pre-op exam for left knee replacement and htn. Her right knee replacement was 6 months ago and she is recovering well. She endorses having full ROM and function of right knee at the moment, but still had local pain in the medial aspect of right knee post-op. She is in for pre-op on left knee replacement and states that she previously had cortisol shot for her left knee which she report did not help. She also experienced side effects of anxiety and restlessness after shot. She is not experiencing much left knee pain, however she is still taking her Meloxican 15 mg PO. Denies CP/palp/SOB/HA/congestion/fevers/GI or GU c/o. Taking meds as prescribed.  After Amlodipine 2.5 mg px her tachycardiac episodes have subsided.  Review of Systems  Constitutional:  Negative for chills, fatigue and fever.  HENT:  Negative for congestion, rhinorrhea, sinus pressure, sinus pain and sore throat.   Eyes:  Negative for pain and visual disturbance.  Respiratory:  Negative for cough and shortness of breath.   Cardiovascular:  Negative for chest pain, palpitations and leg swelling.  Gastrointestinal:  Negative for abdominal pain, blood in stool, diarrhea, nausea and vomiting.  Genitourinary:  Negative for decreased urine volume, flank pain, frequency, vaginal bleeding and vaginal discharge.  Musculoskeletal:  Negative for back pain.  Neurological:  Negative for headaches.   History Past Medical History:  Diagnosis Date   Acute mastitis of left breast 10/04/2007   Allergy    seasonal allergies   AMA (advanced maternal age) multigravida 35+    Cancer ([place]) 2002   Left leg melanoma   Celiac disease    Chronic back pain    H/O   Complication of anesthesia    Difficult  time waking up   Constipation in pregnancy 03/2006   Endometriosis    h/o   Environmental allergies    Fibroid    During first pregnancy   Fibromyalgia    pt denies dx   First trimester bleeding 2008   GBS carrier    GERD (gastroesophageal reflux disease)    with certain foods-OTC PRN   Granuloma annulare    H/O amenorrhea 10/2006   H/O blood clots    7 yr ago in arm   H/O varicella    As an infant.   Headache(784.0)    Hearing loss 08/15/2016   Heart murmur    heard occassional at MD office   HTN (hypertension)    on meds   Hyperlipidemia 03/02/2017   diet controlled   IBS (irritable bowel syndrome) 08/15/2016   Irregular uterine bleeding    Joint pain    Lactose intolerance    Leg edema    Leg pain, left 02/09/2016   Monilial vaginitis 07/27/04   Palpitations    Rh negative status during pregnancy    SAB (spontaneous abortion)    h/o x 2   Seasonal allergies    Sleep apnea 08/15/2016   no CPAP use- uses nightguard   TMJ syndrome    Vegetarianism 08/15/2016   Vitamin D deficiency 08/16/2015    She has a past surgical history that includes Wisdom tooth extraction (1989); Appendectomy (2002); endocervical polyp removal (2001); melanoma removal  (2003); uterine curettage (01/2009);  Endometrial ablation (2010); Hysteroscopy w/ endometrial ablation (01/28/2009); Tendon release (2004); Salpingoophorectomy (12/08/2011); Dilation and evacuation; Abdominal hysterectomy (2013); Melanoma excision (2002); Hernia repair (05/16/12); Ventral hernia repair (N/A, 05/16/2012); Insertion of mesh (N/A, 05/16/2012); and Knee arthroscopy (Left, 12/05/2012).   Her family history includes Anxiety disorder in her mother; Atrial fibrillation in her father; Bladder Cancer (age of onset: 69) in her father; Breast cancer (age of onset: 3) in her mother; Breast cancer (age of onset: 29) in her maternal grandmother; Diverticulitis in her brother; Heart disease in her father, maternal grandfather, mother, and  paternal grandfather; Hypertension in her father and mother; Lymphoma (age of onset: 56) in her father; Obesity in her father; Ovarian cancer (age of onset: 50) in her maternal aunt.She reports that she quit smoking about 6 months ago. Her smoking use included cigarettes. She has never used smokeless tobacco. She reports that she does not drink alcohol and does not use drugs.  Current Outpatient Medications on File Prior to Visit  Medication Sig Dispense Refill   amLODipine (NORVASC) 2.5 MG tablet Take 2.5 mg by mouth daily.     Cetirizine HCl (ZYRTEC PO) Take 1 tablet by mouth daily.     cholecalciferol (VITAMIN D3) 25 MCG (1000 UNIT) tablet Take 1,000 Units by mouth 2 (two) times daily.     estradiol (CLIMARA - DOSED IN MG/24 HR) 0.0375 mg/24hr patch Place 0.0375 mg onto the skin once a week.     hydrochlorothiazide (HYDRODIURIL) 25 MG tablet TAKE 1/2 TO 1 TABLET BY MOUTH DAILY 30 tablet 3   lisinopril (ZESTRIL) 20 MG tablet Take 1 tablet (20 mg total) by mouth in the morning and at bedtime. 180 tablet 3   Misc. Devices (DENTAL GUARD) MISC 1 Device by Does not apply route See admin instructions. 1 each 0   Multiple Vitamins-Minerals (MULTIVITAMIN ADULT PO) Take by mouth.     TURMERIC CURCUMIN PO Take by mouth.     meloxicam (MOBIC) 15 MG tablet Take 15 mg by mouth daily. (Patient not taking: Reported on 11/17/2020)     No current facility-administered medications on file prior to visit.     Objective:  Objective  Physical Exam Constitutional:      General: She is not in acute distress.    Appearance: Normal appearance. She is not ill-appearing or toxic-appearing.  HENT:     Head: Normocephalic and atraumatic.     Right Ear: Tympanic membrane, ear canal and external ear normal.     Left Ear: Tympanic membrane, ear canal and external ear normal.     Nose: No congestion or rhinorrhea.  Eyes:     Extraocular Movements: Extraocular movements intact.     Pupils: Pupils are equal, round, and  reactive to light.  Cardiovascular:     Rate and Rhythm: Normal rate and regular rhythm.     Pulses: Normal pulses.     Heart sounds: Normal heart sounds. No murmur heard. Pulmonary:     Effort: Pulmonary effort is normal. No respiratory distress.     Breath sounds: Normal breath sounds. No wheezing, rhonchi or rales.  Abdominal:     General: Bowel sounds are normal.     Palpations: Abdomen is soft. There is no mass.     Tenderness: There is no abdominal tenderness. There is no guarding.     Hernia: No hernia is present.  Musculoskeletal:        General: Normal range of motion.     Cervical back: Normal range of  motion and neck supple.  Skin:    General: Skin is warm and dry.  Neurological:     Mental Status: She is alert and oriented to person, place, and time.  Psychiatric:        Behavior: Behavior normal.   BP 110/64   Pulse 92   Temp 98.5 F (36.9 C)   Resp 16   Ht 5\' 4"  (1.626 m)   Wt 237 lb 9.6 oz (107.8 kg)   LMP 02/22/2011   SpO2 98%   BMI 40.78 kg/m  Wt Readings from Last 3 Encounters:  11/17/20 237 lb 9.6 oz (107.8 kg)  07/30/20 234 lb 9.6 oz (106.4 kg)  06/24/20 238 lb (108 kg)     Lab Results  Component Value Date   WBC 6.3 11/03/2020   HGB 14.3 11/03/2020   HCT 42.3 11/03/2020   PLT 379.0 11/03/2020   GLUCOSE 114 (H) 11/03/2020   CHOL 161 11/03/2020   TRIG 130.0 11/03/2020   HDL 44.60 11/03/2020   LDLCALC 91 11/03/2020   ALT 28 11/03/2020   AST 15 11/03/2020   NA 136 11/03/2020   K 4.4 11/03/2020   CL 99 11/03/2020   CREATININE 0.54 11/03/2020   BUN 12 11/03/2020   CO2 30 11/03/2020   TSH 1.53 11/03/2020   INR 1.0 11/03/2020   HGBA1C 5.7 11/03/2020    MR KNEE RIGHT WO CONTRAST  Result Date: 05/08/2020 CLINICAL DATA:  Left knee pain for 2 months EXAM: MRI OF THE RIGHT KNEE WITHOUT CONTRAST TECHNIQUE: Multiplanar, multisequence MR imaging of the knee was performed. No intravenous contrast was administered. COMPARISON:  X-ray 12/13/2018  FINDINGS: MENISCI Medial meniscus: Complete radial tear of the posterior horn of the medial meniscus just medial to the posterior root attachment site (series 6, image 13). Intrasubstance degeneration of the medial meniscal body, which is medially extruded. Lateral meniscus:  Intact. LIGAMENTS Cruciates:  Intact ACL and PCL. Collaterals: Intact MCL with mild periligamentous edema, which may be reactive secondary to adjacent meniscal pathology. Lateral collateral ligament complex intact. CARTILAGE Patellofemoral: Mild-moderate chondral thinning and surface irregularity most pronounced within the trochlear groove. Medial: Moderate diffuse cartilage thinning and surface irregularity of the weight-bearing medial compartment with areas of full-thickness chondral fissuring and subchondral marrow edema. Lateral:  No chondral defect. Joint: Small-moderate knee joint effusion. Fat pads within normal limits. Popliteal Fossa: Small complex Baker's cyst with inferior leakage/rupture. Intact popliteus tendon. Extensor Mechanism:  Intact quadriceps tendon and patellar tendon. Bones: Medial compartment joint space narrowing with subchondral marrow edema at the peripheral aspects of the medial femoral condyle and medial tibial plateau. No discernible subchondral fracture line. Remaining osseous structures within normal limits. No suspicious bone lesion. Other: Nonspecific circumferential subcutaneous edema. IMPRESSION: 1. Complete radial tear of the posterior horn of the medial meniscus adjacent to the posterior root attachment site. 2. Grade 1 MCL sprain. 3. Moderate medial compartment osteoarthritis. 4. Small-to-moderate knee joint effusion. Small complex Baker's cyst with inferior leakage/rupture. Electronically Signed   By: Davina Poke D.O.   On: 05/08/2020 09:14     Assessment & Plan:  Plan    No orders of the defined types were placed in this encounter.   Problem List Items Addressed This Visit     Essential  hypertension    Well controlled, no changes to meds. Encouraged heart healthy diet such as the DASH diet and exercise as tolerated.       Relevant Medications   amLODipine (NORVASC) 2.5 MG tablet  Other Relevant Orders   CBC   TSH   Preventative health care    Patient encouraged to maintain heart healthy diet, regular exercise, adequate sleep. Consider daily probiotics. Take medications as prescribed.       Vitamin D deficiency    Supplement and monitor      Relevant Orders   VITAMIN D 25 Hydroxy (Vit-D Deficiency, Fractures)   Left knee pain    She had to have her right knee replaced first due to an injury and while that knee continues to cause her some discomfort it is functional and she is willing to proceed with Left TKR with Dr Doneta Public. She is anxious to proceed so she can begin walking again. She is medically cleared to proceed with surgery      Vegetarianism - Primary    Encouraged to increase protein intake as she prepares for surgery      Relevant Orders   B12   Hyperlipidemia    Encourage heart healthy diet such as MIND or DASH diet, increase exercise, avoid trans fats, simple carbohydrates and processed foods, consider a krill or fish or flaxseed oil cap daily.       Relevant Medications   amLODipine (NORVASC) 2.5 MG tablet   Hyperglycemia    hgba1c acceptable, minimize simple carbs. Increase exercise as tolerated.       Relevant Orders   Hemoglobin A1c   Comprehensive metabolic panel   Lipid panel    Follow-up: Return needs lab appt on 1/5 or after but before CPE later in Jan.  I, Suezanne Jacquet, acting as a scribe for Penni Homans, MD, have documented all relevent documentation on behalf of Penni Homans, MD, as directed by Penni Homans, MD while in the presence of Penni Homans, MD. DO:11/18/20.  I, Mosie Lukes, MD personally performed the services described in this documentation. All medical record entries made by the scribe were at my direction and in my  presence. I have reviewed the chart and agree that the record reflects my personal performance and is accurate and complete

## 2020-11-17 NOTE — Assessment & Plan Note (Signed)
Well controlled, no changes to meds. Encouraged heart healthy diet such as the DASH diet and exercise as tolerated.  °

## 2020-11-17 NOTE — Patient Instructions (Signed)
YCXKGY (weekly) or Saxenda (daily) injections for weight loss, check coverage with insurance and availability with pharmacy and let us know if you want a prescription.  Osteoarthritis Osteoarthritis is a type of arthritis. It refers to joint pain or joint disease. Osteoarthritis affects tissue that covers the ends of bones in joints (cartilage). Cartilage acts as a cushion between the bones and helps them move smoothly. Osteoarthritis occurs when cartilage in the joints gets worn down. Osteoarthritis is sometimes called "wear and tear" arthritis. Osteoarthritis is the most common form of arthritis. It often occurs in older people. It is a condition that gets worse over time. The joints most often affected by this condition are in the fingers, toes, hips, knees, and spine, including the neck and lower back. What are the causes? This condition is caused by the wearing down of cartilage that covers the ends of bones. What increases the risk? The following factors may make you more likely to develop this condition: Being age 34 or older. Obesity. Overuse of joints. Past injury of a joint. Past surgery on a joint. Family history of osteoarthritis. What are the signs or symptoms? The main symptoms of this condition are pain, swelling, and stiffness in the joint. Other symptoms may include: An enlarged joint. More pain and further damage caused by small pieces of bone or cartilage that break off and float inside of the joint. Small deposits of bone (osteophytes) that grow on the edges of the joint. A grating or scraping feeling inside the joint when you move it. Popping or creaking sounds when you move. Difficulty walking or exercising. An inability to grip items, twist your hand(s), or control the movements of your hands and fingers. How is this diagnosed? This condition may be diagnosed based on: Your medical history. A physical exam. Your symptoms. X-rays of the affected joint(s). Blood  tests to rule out other types of arthritis. How is this treated? There is no cure for this condition, but treatment can help control pain and improve joint function. Treatment may include a combination of therapies, such as: Pain relief techniques, such as: Applying heat and cold to the joint. Massage. A form of talk therapy called cognitive behavioral therapy (CBT). This therapy helps you set goals and follow up on the changes that you make. Medicines for pain and inflammation. The medicines can be taken by mouth or applied to the skin. They include: NSAIDs, such as ibuprofen. Prescription medicines. Strong anti-inflammatory medicines (corticosteroids). Certain nutritional supplements. A prescribed exercise program. You may work with a physical therapist. Assistive devices, such as a brace, wrap, splint, specialized glove, or cane. A weight control plan. Surgery, such as: An osteotomy. This is done to reposition the bones and relieve pain or to remove loose pieces of bone and cartilage. Joint replacement surgery. You may need this surgery if you have advanced osteoarthritis. Follow these instructions at home: Activity Rest your affected joints as told by your health care provider. Exercise as told by your health care provider. He or she may recommend specific types of exercise, such as: Strengthening exercises. These are done to strengthen the muscles that support joints affected by arthritis. Aerobic activities. These are exercises, such as brisk walking or water aerobics, that increase your heart rate. Range-of-motion activities. These help your joints move more easily. Balance and agility exercises. Managing pain, stiffness, and swelling   If directed, apply heat to the affected area as often as told by your health care provider. Use the heat source that  your health care provider recommends, such as a moist heat pack or a heating pad. If you have a removable assistive device, remove  it as told by your health care provider. Place a towel between your skin and the heat source. If your health care provider tells you to keep the assistive device on while you apply heat, place a towel between the assistive device and the heat source. Leave the heat on for 20-30 minutes. Remove the heat if your skin turns bright red. This is especially important if you are unable to feel pain, heat, or cold. You may have a greater risk of getting burned. If directed, put ice on the affected area. To do this: If you have a removable assistive device, remove it as told by your health care provider. Put ice in a plastic bag. Place a towel between your skin and the bag. If your health care provider tells you to keep the assistive device on during icing, place a towel between the assistive device and the bag. Leave the ice on for 20 minutes, 2-3 times a day. Move your fingers or toes often to reduce stiffness and swelling. Raise (elevate) the injured area above the level of your heart while you are sitting or lying down. General instructions Take over-the-counter and prescription medicines only as told by your health care provider. Maintain a healthy weight. Follow instructions from your health care provider for weight control. Do not use any products that contain nicotine or tobacco, such as cigarettes, e-cigarettes, and chewing tobacco. If you need help quitting, ask your health care provider. Use assistive devices as told by your health care provider. Keep all follow-up visits as told by your health care provider. This is important. Where to find more information [employer] of Arthritis and Musculoskeletal and Skin Diseases: www.niams.SouthEx[employer].es [employer] on Aging: http://kim-miller.com/ American College of Rheumatology: www.rheumatology.org Contact a health care provider if: You have redness, swelling, or a feeling of warmth in a joint that gets worse. You have a fever along with joint or  muscle aches. You develop a rash. You have trouble doing your normal activities. Get help right away if: You have pain that gets worse and is not relieved by pain medicine. Summary Osteoarthritis is a type of arthritis that affects tissue covering the ends of bones in joints (cartilage). This condition is caused by the wearing down of cartilage that covers the ends of bones. The main symptom of this condition is pain, swelling, and stiffness in the joint. There is no cure for this condition, but treatment can help control pain and improve joint function. This information is not intended to replace advice given to you by your health care provider. Make sure you discuss any questions you have with your health care provider. Document Revised: 02/11/2019 Document Reviewed: 02/11/2019 Elsevier Patient Education  [place].

## 2020-11-17 NOTE — Assessment & Plan Note (Signed)
Supplement and monitor 

## 2020-11-17 NOTE — Assessment & Plan Note (Signed)
hgba1c acceptable, minimize simple carbs. Increase exercise as tolerated.  

## 2020-11-18 NOTE — Assessment & Plan Note (Signed)
Encouraged DASH or MIND diet, decrease po intake and increase exercise as tolerated. Needs 7-8 hours of sleep nightly. Avoid trans fats, eat small, frequent meals every 4-5 hours with lean proteins, complex carbs and healthy fats. Minimize simple carbs, high fat foods and processed foods. Consider Wegovy or Saxenda °

## 2020-11-18 NOTE — Assessment & Plan Note (Addendum)
She had to have her right knee replaced first due to an injury and while that knee continues to cause her some discomfort it is functional and she is willing to proceed with Left TKR with Dr Doneta Public. She is anxious to proceed so she can begin walking again. She is medically cleared to proceed with surgery

## 2020-11-18 NOTE — Assessment & Plan Note (Signed)
Encouraged to increase protein intake as she prepares for surgery

## 2020-11-20 ENCOUNTER — Encounter: Payer: Self-pay | Admitting: *Deleted

## 2020-11-23 ENCOUNTER — Encounter: Payer: 59 | Admitting: Family Medicine

## 2021-01-25 ENCOUNTER — Other Ambulatory Visit: Payer: Self-pay | Admitting: Family Medicine

## 2021-03-01 ENCOUNTER — Other Ambulatory Visit: Payer: Self-pay | Admitting: Family Medicine

## 2021-03-01 DIAGNOSIS — I10 Essential (primary) hypertension: Secondary | ICD-10-CM

## 2021-03-04 ENCOUNTER — Other Ambulatory Visit (INDEPENDENT_AMBULATORY_CARE_PROVIDER_SITE_OTHER): Payer: 59

## 2021-03-04 DIAGNOSIS — I10 Essential (primary) hypertension: Secondary | ICD-10-CM | POA: Diagnosis not present

## 2021-03-04 DIAGNOSIS — Z789 Other specified health status: Secondary | ICD-10-CM | POA: Diagnosis not present

## 2021-03-04 DIAGNOSIS — R739 Hyperglycemia, unspecified: Secondary | ICD-10-CM | POA: Diagnosis not present

## 2021-03-04 DIAGNOSIS — E559 Vitamin D deficiency, unspecified: Secondary | ICD-10-CM

## 2021-03-04 LAB — COMPREHENSIVE METABOLIC PANEL
ALT: 27 U/L (ref 0–35)
AST: 15 U/L (ref 0–37)
Albumin: 4 g/dL (ref 3.5–5.2)
Alkaline Phosphatase: 68 U/L (ref 39–117)
BUN: 10 mg/dL (ref 6–23)
CO2: 30 mEq/L (ref 19–32)
Calcium: 9.8 mg/dL (ref 8.4–10.5)
Chloride: 102 mEq/L (ref 96–112)
Creatinine, Ser: 0.56 mg/dL (ref 0.40–1.20)
GFR: 105.1 mL/min (ref >60.00)
Glucose, Bld: 97 mg/dL (ref 70–99)
Potassium: 4.6 mEq/L (ref 3.5–5.1)
Sodium: 138 mEq/L (ref 135–145)
Total Bilirubin: 0.5 mg/dL (ref 0.2–1.2)
Total Protein: 6.3 g/dL (ref 6.0–8.3)

## 2021-03-04 LAB — CBC
HCT: 39.6 % (ref 36.0–46.0)
Hemoglobin: 13.3 g/dL (ref 12.0–15.0)
MCHC: 33.7 g/dL (ref 30.0–36.0)
MCV: 85.5 fl (ref 78.0–100.0)
Platelets: 355 10*3/uL (ref 150.0–400.0)
RBC: 4.64 Mil/uL (ref 3.87–5.11)
RDW: 14.2 % (ref 11.5–15.5)
WBC: 6.7 10*3/uL (ref 4.0–10.5)

## 2021-03-04 LAB — HEMOGLOBIN A1C: Hgb A1c MFr Bld: 5.7 % (ref 4.6–6.5)

## 2021-03-04 LAB — LIPID PANEL
Cholesterol: 170 mg/dL (ref 0–200)
HDL: 42.9 mg/dL (ref >39.00)
LDL Cholesterol: 101 mg/dL — ABNORMAL HIGH (ref 0–99)
NonHDL: 126.61
Total CHOL/HDL Ratio: 4
Triglycerides: 129 mg/dL (ref 0.0–149.0)
VLDL: 25.8 mg/dL (ref 0.0–40.0)

## 2021-03-04 LAB — VITAMIN D 25 HYDROXY (VIT D DEFICIENCY, FRACTURES): VITD: 39.55 ng/mL (ref 30.00–100.00)

## 2021-03-04 LAB — VITAMIN B12: Vitamin B-12: 232 pg/mL (ref 211–911)

## 2021-03-04 LAB — TSH: TSH: 1.23 u[IU]/mL (ref 0.35–5.50)

## 2021-03-25 ENCOUNTER — Ambulatory Visit (INDEPENDENT_AMBULATORY_CARE_PROVIDER_SITE_OTHER): Payer: 59 | Admitting: Family Medicine

## 2021-03-25 ENCOUNTER — Encounter: Payer: Self-pay | Admitting: Family Medicine

## 2021-03-25 VITALS — BP 116/64 | HR 97 | Temp 98.3°F | Resp 16 | Ht 64.0 in | Wt 239.0 lb

## 2021-03-25 DIAGNOSIS — Z23 Encounter for immunization: Secondary | ICD-10-CM | POA: Diagnosis not present

## 2021-03-25 DIAGNOSIS — Z0000 Encounter for general adult medical examination without abnormal findings: Secondary | ICD-10-CM | POA: Diagnosis not present

## 2021-03-25 DIAGNOSIS — E559 Vitamin D deficiency, unspecified: Secondary | ICD-10-CM

## 2021-03-25 DIAGNOSIS — E785 Hyperlipidemia, unspecified: Secondary | ICD-10-CM | POA: Diagnosis not present

## 2021-03-25 DIAGNOSIS — I10 Essential (primary) hypertension: Secondary | ICD-10-CM | POA: Diagnosis not present

## 2021-03-25 DIAGNOSIS — R739 Hyperglycemia, unspecified: Secondary | ICD-10-CM | POA: Diagnosis not present

## 2021-03-25 NOTE — Assessment & Plan Note (Signed)
Encourage heart healthy diet such as MIND or DASH diet, increase exercise, avoid trans fats, simple carbohydrates and processed foods, consider a krill or fish or flaxseed oil cap daily.  °

## 2021-03-25 NOTE — Assessment & Plan Note (Signed)
Well controlled, no changes to meds. Encouraged heart healthy diet such as the DASH diet and exercise as tolerated.  °

## 2021-03-25 NOTE — Patient Instructions (Addendum)
Wegovy or Saxenda for weight loss.  MIND diet  [name] Preventive Care 59-53 Years Old, Female Preventive care refers to lifestyle choices and visits with your health care provider that can promote health and wellness. Preventive care visits are also called wellness exams. What can I expect for my preventive care visit? Counseling Your health care provider may ask you questions about your: Medical history, including: Past medical problems. Family medical history. Pregnancy history. Current health, including: Menstrual cycle. Method of birth control. Emotional well-being. Home life and relationship well-being. Sexual activity and sexual health. Lifestyle, including: Alcohol, nicotine or tobacco, and drug use. Access to firearms. Diet, exercise, and sleep habits. Work and work Statistician. Sunscreen use. Safety issues such as seatbelt and bike helmet use. Physical exam Your health care provider will check your: Height and weight. These may be used to calculate your BMI (body mass index). BMI is a measurement that tells if you are at a healthy weight. Waist circumference. This measures the distance around your waistline. This measurement also tells if you are at a healthy weight and may help predict your risk of certain diseases, such as type 2 diabetes and high blood pressure. Heart rate and blood pressure. Body temperature. Skin for abnormal spots. What immunizations do I need? Vaccines are usually given at various ages, according to a schedule. Your health care provider will recommend vaccines for you based on your age, medical history, and lifestyle or other factors, such as travel or where you work. What tests do I need? Screening Your health care provider may recommend screening tests for certain conditions. This may include: Lipid and cholesterol levels. Diabetes screening. This is done by checking your blood sugar (glucose) after you have not eaten for a while  (fasting). Pelvic exam and Pap test. Hepatitis B test. Hepatitis C test. HIV (human immunodeficiency virus) test. STI (sexually transmitted infection) testing, if you are at risk. Lung cancer screening. Colorectal cancer screening. Mammogram. Talk with your health care provider about when you should start having regular mammograms. This may depend on whether you have a family history of breast cancer. BRCA-related cancer screening. This may be done if you have a family history of breast, ovarian, tubal, or peritoneal cancers. Bone density scan. This is done to screen for osteoporosis. Talk with your health care provider about your test results, treatment options, and if necessary, the need for more tests. Follow these instructions at home: Eating and drinking  Eat a diet that includes fresh fruits and vegetables, whole grains, lean protein, and low-fat dairy products. Take vitamin and mineral supplements as recommended by your health care provider. Do not drink alcohol if: Your health care provider tells you not to drink. You are pregnant, may be pregnant, or are planning to become pregnant. If you drink alcohol: Limit how much you have to 0-1 drink a day. Know how much alcohol is in your drink. In the [place] one drink equals one 12 oz bottle of beer (355 mL), one 5 oz glass of wine (148 mL), or one 1 oz glass of hard liquor (44 mL). Lifestyle Brush your teeth every morning and night with fluoride toothpaste. Floss one time each day. Exercise for at least 30 minutes 5 or more days each week. Do not use any products that contain nicotine or tobacco. These products include cigarettes, chewing tobacco, and vaping devices, such as e-cigarettes. If you need help quitting, ask your health care provider. Do not use drugs. If you are sexually active, practice  safe sex. Use a condom or other form of protection to prevent STIs. If you do not wish to become pregnant, use a form of birth control. If  you plan to become pregnant, see your health care provider for a prepregnancy visit. Take aspirin only as told by your health care provider. Make sure that you understand how much to take and what form to take. Work with your health care provider to find out whether it is safe and beneficial for you to take aspirin daily. Find healthy ways to manage stress, such as: Meditation, yoga, or listening to music. Journaling. Talking to a trusted person. Spending time with friends and family. Minimize exposure to UV radiation to reduce your risk of skin cancer. Safety Always wear your seat belt while driving or riding in a vehicle. Do not drive: If you have been drinking alcohol. Do not ride with someone who has been drinking. When you are tired or distracted. While texting. If you have been using any mind-altering substances or drugs. Wear a helmet and other protective equipment during sports activities. If you have firearms in your house, make sure you follow all gun safety procedures. Seek help if you have been physically or sexually abused. What's next? Visit your health care provider once a year for an annual wellness visit. Ask your health care provider how often you should have your eyes and teeth checked. Stay up to date on all vaccines. This information is not intended to replace advice given to you by your health care provider. Make sure you discuss any questions you have with your health care provider. Document Revised: 08/12/2020 Document Reviewed: 08/12/2020 Elsevier Patient Education  [place].

## 2021-03-25 NOTE — Assessment & Plan Note (Signed)
Supplement and monitor 

## 2021-03-25 NOTE — Assessment & Plan Note (Addendum)
Patient encouraged to maintain heart healthy diet, regular exercise, adequate sleep. Consider daily probiotics. Take medications as prescribed. Labs ordered and reviewed. Colonoscopy February 2022 repeat in 2032.

## 2021-03-25 NOTE — Assessment & Plan Note (Signed)
hgba1c acceptable, minimize simple carbs. Increase exercise as tolerated.  

## 2021-03-26 NOTE — Assessment & Plan Note (Signed)
Consider Kirke Shaggy or Mancel Parsons again if insurance will cover

## 2021-03-26 NOTE — Progress Notes (Signed)
Subjective:    Patient ID: [name], female    DOB: [birth year], 51 y.o.   MRN: [id number]  Chief Complaint  Patient presents with   Annual Exam    HPI Patient is in today for annual preventative exam and follow up on chronic medical concerns. No recent febrile illness or hospitalizations. She feels well today. She is frustrated with her weight but continues to maintain a vegetarian diet. She is not as active as she would like to be but does exercise some of the time. She does not offer complaints of polyuria or polydipsia. Denies CP/palp/SOB/HA/congestion/fevers/GI or GU c/o. Taking meds as prescribed   Past Medical History:  Diagnosis Date   Acute mastitis of left breast 10/04/2007   Allergy    seasonal allergies   AMA (advanced maternal age) multigravida 35+    Cancer ([facility]) 2002   Left leg melanoma   Celiac disease    Chronic back pain    H/O   Complication of anesthesia    Difficult time waking up   Constipation in pregnancy 03/2006   Endometriosis    h/o   Environmental allergies    Fibroid    During first pregnancy   Fibromyalgia    pt denies dx   First trimester bleeding 2008   GBS carrier    GERD (gastroesophageal reflux disease)    with certain foods-OTC PRN   Granuloma annulare    H/O amenorrhea 10/2006   H/O blood clots    7 yr ago in arm   H/O varicella    As an infant.   Headache(784.0)    Hearing loss 08/15/2016   Heart murmur    heard occassional at MD office   HTN (hypertension)    on meds   Hyperlipidemia 03/02/2017   diet controlled   IBS (irritable bowel syndrome) 08/15/2016   Irregular uterine bleeding    Joint pain    Lactose intolerance    Leg edema    Leg pain, left 02/09/2016   Monilial vaginitis 07/27/04   Palpitations    Rh negative status during pregnancy    SAB (spontaneous abortion)    h/o x 2   Seasonal allergies    Sleep apnea 08/15/2016   no CPAP use- uses nightguard   TMJ syndrome    Vegetarianism 08/15/2016   Vitamin D  deficiency 08/16/2015    Past Surgical History:  Procedure Laterality Date   ABDOMINAL HYSTERECTOMY  03/01/2011   APPENDECTOMY  02/29/2000   DILATION AND EVACUATION     endocervical polyp removal  03/01/1999   ENDOMETRIAL ABLATION  02/29/2008   HERNIA REPAIR  05/16/2012   incisional hernia repair   HYSTEROSCOPY W/ ENDOMETRIAL ABLATION  01/28/2009   INSERTION OF MESH N/A 05/16/2012   Procedure: INSERTION OF MESH;  Surgeon: Marcello Moores A. Cornett, MD;  Location: [facility];  Service: General;  Laterality: N/A;   KNEE ARTHROSCOPY Left 12/05/2012   Procedure: LEFT KNEE ARTHROSCOPY;  Surgeon: Kerin Salen, MD;  Location: [facility];  Service: Orthopedics;  Laterality: Left;   MELANOMA EXCISION  02/29/2000   Left Leg   melanoma removal   02/28/2001   REPLACEMENT TOTAL KNEE BILATERAL Bilateral    SALPINGOOPHORECTOMY  12/08/2011   Procedure: SALPINGO OOPHERECTOMY;  Surgeon: Eldred Manges, MD;  Location: [facility];  Service: Gynecology;  Laterality: Bilateral;   TENDON RELEASE  02/28/2002   uterine curettage  01/28/2009   VENTRAL HERNIA REPAIR N/A 05/16/2012   Procedure: LAPAROSCOPIC VENTRAL  HERNIA;  Surgeon: Joyice Faster. Cornett, MD;  Location: [facility];  Service: General;  Laterality: N/A;   [facility] EXTRACTION  03/01/1987    Family History  Problem Relation Age of Onset   Hypertension Mother    Heart disease Mother    Anxiety disorder Mother    Breast cancer Mother 45   Heart disease Father        atrial fib   Hypertension Father    Atrial fibrillation Father    Obesity Father    Bladder Cancer Father 72   Lymphoma Father 60   Breast cancer Maternal Grandmother 54   Heart disease Maternal Grandfather        MI   Heart disease Paternal Grandfather        MI   Ovarian cancer Maternal Aunt 50   Diverticulitis Brother    Colon cancer Neg Hx    Colon polyps Neg Hx    Esophageal cancer Neg Hx    Rectal cancer Neg Hx    Stomach cancer Neg Hx     Social History    Socioeconomic History   Marital status: Married    Spouse name: Psychiatric nurse   Number of children: 3   Years of education: Not on file   Highest education level: Not on file  Occupational History   Occupation: Armed forces operational officer  Tobacco Use   Smoking status: Former    Types: Cigarettes    Quit date: 04/26/2020    Years since quitting: 0.9   Smokeless tobacco: Never   Tobacco comments:    1-3 cigarrettes per week  Vaping Use   Vaping Use: Never used  Substance and Sexual Activity   Alcohol use: No   Drug use: No   Sexual activity: Yes    Birth control/protection: Surgical    Comment: BTL  Other Topics Concern   Not on file  Social History Narrative   ** Merged History Encounter **       Lives with husband and 3 children Owns a Animal nutritionist hospital Vegetarian Wears seat belt   Social Determinants of Health   Financial Resource Strain: Not on file  Food Insecurity: Not on file  Transportation Needs: Not on file  Physical Activity: Not on file  Stress: Not on file  Social Connections: Not on file  Intimate Partner Violence: Not on file    Outpatient Medications Prior to Visit  Medication Sig Dispense Refill   amLODipine (NORVASC) 2.5 MG tablet TAKE 1 TABLET BY MOUTH DAILY 30 tablet 3   Cetirizine HCl (ZYRTEC PO) Take 1 tablet by mouth daily.     cholecalciferol (VITAMIN D3) 25 MCG (1000 UNIT) tablet Take 1,000 Units by mouth daily.     estradiol (CLIMARA - DOSED IN MG/24 HR) 0.0375 mg/24hr patch Place 0.0375 mg onto the skin once a week.     hydrochlorothiazide (HYDRODIURIL) 25 MG tablet TAKE 1/2 TO 1 TABLET BY MOUTH DAILY 30 tablet 3   lisinopril (ZESTRIL) 20 MG tablet Take 1 tablet (20 mg total) by mouth in the morning and at bedtime. 180 tablet 3   Misc. Devices (DENTAL GUARD) MISC 1 Device by Does not apply route See admin instructions. 1 each 0   Multiple Vitamins-Minerals (MULTIVITAMIN ADULT PO) Take by mouth.     TURMERIC CURCUMIN PO Take by mouth.      meloxicam (MOBIC) 15 MG tablet Take 15 mg by mouth daily. (Patient not taking: Reported on 11/17/2020)     No facility-administered medications  prior to visit.    Allergies  Allergen Reactions   Cymbalta [Duloxetine Hcl] Other (See Comments)    Makes her feel crazy, out of body   Betadine [Povidone Iodine] Other (See Comments)    Dx with allergy testing   Iodine    Shellfish Allergy Other (See Comments)    Dx with allergy testing    ROS     Objective:    Physical Exam  BP 116/64    Pulse 97    Temp 98.3 F (36.8 C)    Resp 16    Ht 5\' 4"  (1.626 m)    Wt 239 lb (108.4 kg)    LMP 02/22/2011    SpO2 95%    BMI 41.02 kg/m  Wt Readings from Last 3 Encounters:  03/25/21 239 lb (108.4 kg)  11/17/20 237 lb 9.6 oz (107.8 kg)  07/30/20 234 lb 9.6 oz (106.4 kg)    Diabetic Foot Exam - Simple   No data filed    Lab Results  Component Value Date   WBC 6.7 03/04/2021   HGB 13.3 03/04/2021   HCT 39.6 03/04/2021   PLT 355.0 03/04/2021   GLUCOSE 97 03/04/2021   CHOL 170 03/04/2021   TRIG 129.0 03/04/2021   HDL 42.90 03/04/2021   LDLCALC 101 (H) 03/04/2021   ALT 27 03/04/2021   AST 15 03/04/2021   NA 138 03/04/2021   K 4.6 03/04/2021   CL 102 03/04/2021   CREATININE 0.56 03/04/2021   BUN 10 03/04/2021   CO2 30 03/04/2021   TSH 1.23 03/04/2021   INR 1.0 11/03/2020   HGBA1C 5.7 03/04/2021    Lab Results  Component Value Date   TSH 1.23 03/04/2021   Lab Results  Component Value Date   WBC 6.7 03/04/2021   HGB 13.3 03/04/2021   HCT 39.6 03/04/2021   MCV 85.5 03/04/2021   PLT 355.0 03/04/2021   Lab Results  Component Value Date   NA 138 03/04/2021   K 4.6 03/04/2021   CO2 30 03/04/2021   GLUCOSE 97 03/04/2021   BUN 10 03/04/2021   CREATININE 0.56 03/04/2021   BILITOT 0.5 03/04/2021   ALKPHOS 68 03/04/2021   AST 15 03/04/2021   ALT 27 03/04/2021   PROT 6.3 03/04/2021   ALBUMIN 4.0 03/04/2021   CALCIUM 9.8 03/04/2021   GFR 105.10 03/04/2021   Lab Results   Component Value Date   CHOL 170 03/04/2021   Lab Results  Component Value Date   HDL 42.90 03/04/2021   Lab Results  Component Value Date   LDLCALC 101 (H) 03/04/2021   Lab Results  Component Value Date   TRIG 129.0 03/04/2021   Lab Results  Component Value Date   CHOLHDL 4 03/04/2021   Lab Results  Component Value Date   HGBA1C 5.7 03/04/2021       Assessment & Plan:   Problem List Items Addressed This Visit     Essential hypertension    Well controlled, no changes to meds. Encouraged heart healthy diet such as the DASH diet and exercise as tolerated.       Obesity    Consider Kirke Shaggy or Mancel Parsons again if insurance will cover      Preventative health care    Patient encouraged to maintain heart healthy diet, regular exercise, adequate sleep. Consider daily probiotics. Take medications as prescribed. Labs ordered and reviewed. Colonoscopy February 2022 repeat in 2032.      Vitamin D deficiency    Supplement  and monitor      Hyperlipidemia    Encourage heart healthy diet such as MIND or DASH diet, increase exercise, avoid trans fats, simple carbohydrates and processed foods, consider a krill or fish or flaxseed oil cap daily.       Hyperglycemia    hgba1c acceptable, minimize simple carbs. Increase exercise as tolerated.       Other Visit Diagnoses     Need for influenza vaccination    -  Primary   Relevant Orders   Flu Vaccine QUAD 36+ mos IM (Fluarix, Fluzone & Afluria Quad PF (Completed)       I have discontinued [name] A. Obenchain's meloxicam. I am also having her maintain her Multiple Vitamins-Minerals (MULTIVITAMIN ADULT PO), Dental Guard, estradiol, Cetirizine HCl (ZYRTEC PO), lisinopril, cholecalciferol, TURMERIC CURCUMIN PO, amLODipine, and hydrochlorothiazide.  No orders of the defined types were placed in this encounter.    Penni Homans, MD

## 2021-05-08 ENCOUNTER — Other Ambulatory Visit: Payer: Self-pay | Admitting: Family Medicine

## 2021-05-19 ENCOUNTER — Inpatient Hospital Stay: Payer: 59 | Attending: Genetic Counselor | Admitting: Genetic Counselor

## 2021-05-19 ENCOUNTER — Encounter: Payer: Self-pay | Admitting: Genetic Counselor

## 2021-05-19 ENCOUNTER — Inpatient Hospital Stay: Payer: 59

## 2021-05-19 DIAGNOSIS — Z803 Family history of malignant neoplasm of breast: Secondary | ICD-10-CM | POA: Insufficient documentation

## 2021-05-19 DIAGNOSIS — Z8041 Family history of malignant neoplasm of ovary: Secondary | ICD-10-CM | POA: Diagnosis not present

## 2021-05-19 DIAGNOSIS — Z85820 Personal history of malignant melanoma of skin: Secondary | ICD-10-CM | POA: Diagnosis not present

## 2021-05-19 LAB — GENETIC SCREENING ORDER

## 2021-05-19 NOTE — Progress Notes (Signed)
REFERRING PROVIDER: ?Donnel Saxon, CNM ?[address] ?[place],  [place] [address] ? ?PRIMARY PROVIDER:  ?Mosie Lukes, MD ? ?PRIMARY REASON FOR VISIT:  ?1. Family history of breast cancer   ?2. History of melanoma   ?3. Family history of ovarian cancer   ? ? ? ?HISTORY OF PRESENT ILLNESS:   ?Ms. Adduci, a 51 y.o. female, was seen for a  cancer genetics consultation at the request of Dr. Cira Servant due to a personal and family history of cancer.  Ms. Gayden presents to clinic today to discuss the possibility of a hereditary predisposition to cancer, genetic testing, and to further clarify her future cancer risks, as well as potential cancer risks for family members.  ? ?In 2003, at the age of 26, Ms. Cassel was diagnosed with melanoma of the leg. The treatment plan excision of the melanoma.  No other treatment was needed.  ? ? ?CANCER HISTORY:  ?Oncology History  ? No history exists.  ? ? ?Past Medical History:  ?Diagnosis Date  ? Acute mastitis of left breast 10/04/2007  ? Allergy   ? seasonal allergies  ? AMA (advanced maternal age) multigravida 61+   ? Cancer [facility]) 2002  ? Left leg melanoma  ? Celiac disease   ? Chronic back pain   ? H/O  ? Complication of anesthesia   ? Difficult time waking up  ? Constipation in pregnancy 03/2006  ? Endometriosis   ? h/o  ? Environmental allergies   ? Family history of breast cancer   ? Family history of ovarian cancer   ? Fibroid   ? During first pregnancy  ? Fibromyalgia   ? pt denies dx  ? First trimester bleeding 2008  ? GBS carrier   ? GERD (gastroesophageal reflux disease)   ? with certain foods-OTC PRN  ? Granuloma annulare   ? H/O amenorrhea 10/2006  ? H/O blood clots   ? 7 yr ago in arm  ? H/O varicella   ? As an infant.  ? Headache(784.0)   ? Hearing loss 08/15/2016  ? Heart murmur   ? heard occassional at MD office  ? History of melanoma   ? HTN (hypertension)   ? on meds  ? Hyperlipidemia 03/02/2017  ? diet controlled  ? IBS (irritable  bowel syndrome) 08/15/2016  ? Irregular uterine bleeding   ? Joint pain   ? Lactose intolerance   ? Leg edema   ? Leg pain, left 02/09/2016  ? Monilial vaginitis 07/27/2004  ? Palpitations   ? Rh negative status during pregnancy   ? SAB (spontaneous abortion)   ? h/o x 2  ? Seasonal allergies   ? Sleep apnea 08/15/2016  ? no CPAP use- uses nightguard  ? TMJ syndrome   ? Vegetarianism 08/15/2016  ? Vitamin D deficiency 08/16/2015  ? ? ?Past Surgical History:  ?Procedure Laterality Date  ? ABDOMINAL HYSTERECTOMY  03/01/2011  ? APPENDECTOMY  02/29/2000  ? DILATION AND EVACUATION    ? endocervical polyp removal  03/01/1999  ? ENDOMETRIAL ABLATION  02/29/2008  ? HERNIA REPAIR  05/16/2012  ? incisional hernia repair  ? HYSTEROSCOPY W/ ENDOMETRIAL ABLATION  01/28/2009  ? INSERTION OF MESH N/A 05/16/2012  ? Procedure: INSERTION OF MESH;  Surgeon: Joyice Faster. Cornett, MD;  Location: [facility];  Service: General;  Laterality: N/A;  ? KNEE ARTHROSCOPY Left 12/05/2012  ? Procedure: LEFT KNEE ARTHROSCOPY;  Surgeon: Kerin Salen, MD;  Location: [facility];  Service: Orthopedics;  Laterality: Left;  ? MELANOMA EXCISION  02/29/2000  ? Left Leg  ? melanoma removal   02/28/2001  ? REPLACEMENT TOTAL KNEE BILATERAL Bilateral   ? SALPINGOOPHORECTOMY  12/08/2011  ? Procedure: SALPINGO OOPHERECTOMY;  Surgeon: Eldred Manges, MD;  Location: [facility];  Service: Gynecology;  Laterality: Bilateral;  ? TENDON RELEASE  02/28/2002  ? uterine curettage  01/28/2009  ? VENTRAL HERNIA REPAIR N/A 05/16/2012  ? Procedure: LAPAROSCOPIC VENTRAL HERNIA;  Surgeon: Joyice Faster. Cornett, MD;  Location: [facility];  Service: General;  Laterality: N/A;  ? WISDOM TOOTH EXTRACTION  03/01/1987  ? ? ?Social History  ? ?Socioeconomic History  ? Marital status: Married  ?  Spouse name: Zaray Gatchel  ? Number of children: 3  ? Years of education: Not on file  ? Highest education level: Not on file  ?Occupational History  ? Occupation: Armed forces operational officer  ?Tobacco  Use  ? Smoking status: Former  ?  Types: Cigarettes  ?  Quit date: 04/26/2020  ?  Years since quitting: 1.0  ? Smokeless tobacco: Never  ? Tobacco comments:  ?  1-3 cigarrettes per week  ?Vaping Use  ? Vaping Use: Never used  ?Substance and Sexual Activity  ? Alcohol use: No  ? Drug use: No  ? Sexual activity: Yes  ?  Birth control/protection: Surgical  ?  Comment: BTL  ?Other Topics Concern  ? Not on file  ?Social History Narrative  ? ** Merged History Encounter **  ?    ? Lives with husband and 3 children ?Owns a Animal nutritionist hospital ?Vegetarian ?Wears seat belt  ? ?Social Determinants of Health  ? ?Financial Resource Strain: Not on file  ?Food Insecurity: Not on file  ?Transportation Needs: Not on file  ?Physical Activity: Not on file  ?Stress: Not on file  ?Social Connections: Not on file  ?  ? ?FAMILY HISTORY:  ?We obtained a detailed, 4-generation family history.  Significant diagnoses are listed below: ?Family History  ?Problem Relation Age of Onset  ? Hypertension Mother   ? Heart disease Mother   ? Anxiety disorder Mother   ? Breast cancer Mother 41  ? Heart disease Father   ?     atrial fib  ? Hypertension Father   ? Atrial fibrillation Father   ? Obesity Father   ? Bladder Cancer Father 48  ? Lymphoma Father 5  ? Diverticulitis Brother   ? Ovarian cancer Maternal Aunt 31  ? Breast cancer Maternal Grandmother 33  ? Heart disease Maternal Grandfather   ?     MI  ? Heart disease Paternal Grandfather   ?     MI  ? Leukemia Nephew 15  ?     AML and ALL  ? Colon cancer Neg Hx   ? Colon polyps Neg Hx   ? Esophageal cancer Neg Hx   ? Rectal cancer Neg Hx   ? Stomach cancer Neg Hx   ? ? ? ? ?The patient has two daughters and a son who are cancer free.  She has three brothers who are cancer free.  One brother has a son who had both AML and ALL at age 39.  Her father is deceased and her mother is living. ? ?The patient's mother was diagnosed with breast cancer at 68.  She has one sister who had ovarian cancer at 32  and was found to have a RAD51D mutation.  Her mother had breast cancer at 2, and  she had a sister who had breast cancer at 23.  The patient's maternal grandmother's mother had ovarian cancer. ? ?The patient's father had bladder cancer and died of lymphoma.  He had two sister and two brothers, none who had cancer.  The paternal grandparents are deceased. ? ?Ms. Canniff is unaware of previous family history of genetic testing for hereditary cancer risks. Patient's maternal ancestors are of [place] descent, and paternal ancestors are of [place], [place] and [place] descent. There is no reported Ashkenazi Jewish ancestry. There is no known consanguinity. ? ?GENETIC COUNSELING ASSESSMENT: Ms. Leeb is a 51 y.o. female with a personal and family history of cancer which is somewhat suggestive of a hereditary cancer syndrome and predisposition to cancer given her young age of onset of melanoma and family history of breast and ovarian cancer. We, therefore, discussed and recommended the following at today's visit.  ? ?DISCUSSION: We discussed that, in general, most cancer is not inherited in families, but instead is sporadic or familial. Sporadic cancers occur by chance and typically happen at older ages (>50 years) as this type of cancer is caused by genetic changes acquired during an individual?s lifetime. Some families have more cancers than would be expected by chance; however, the ages or types of cancer are not consistent with a known genetic mutation or known genetic mutations have been ruled out. This type of familial cancer is thought to be due to a combination of multiple genetic, environmental, hormonal, and lifestyle factors. While this combination of factors likely increases the risk of cancer, the exact source of this risk is not currently identifiable or testable. ? ?We discussed that 5 - 10% of melanoma is hereditary, with most cases associated with CDKN2A.  There are other genes that can be associated with  hereditary melanoma cancer syndromes.  These include BAP1, POT1 and MITF.  We discussed that testing is beneficial for several reasons including knowing how to follow individuals after completing their tre

## 2021-06-01 ENCOUNTER — Encounter: Payer: Self-pay | Admitting: Genetic Counselor

## 2021-06-01 DIAGNOSIS — Z1379 Encounter for other screening for genetic and chromosomal anomalies: Secondary | ICD-10-CM | POA: Insufficient documentation

## 2021-06-02 ENCOUNTER — Telehealth: Payer: Self-pay | Admitting: Genetic Counselor

## 2021-06-02 NOTE — Telephone Encounter (Signed)
Revealed negative genetic testing.  Discussed that we do not know why she had melanoma or why there is cancer in the family. It could be due to a different gene that we are not testing, or maybe our current technology may not be able to pick something up.  It will be important for her to keep in contact with genetics to keep up with whether additional testing may be needed. ? ?Two VUS identified.  These will not change medical management. ? ? ?

## 2021-06-03 ENCOUNTER — Ambulatory Visit: Payer: Self-pay | Admitting: Genetic Counselor

## 2021-06-03 ENCOUNTER — Other Ambulatory Visit: Payer: Self-pay | Admitting: Family Medicine

## 2021-06-03 DIAGNOSIS — Z8041 Family history of malignant neoplasm of ovary: Secondary | ICD-10-CM

## 2021-06-03 DIAGNOSIS — Z1379 Encounter for other screening for genetic and chromosomal anomalies: Secondary | ICD-10-CM

## 2021-06-03 DIAGNOSIS — Z803 Family history of malignant neoplasm of breast: Secondary | ICD-10-CM

## 2021-06-03 NOTE — Progress Notes (Signed)
HPI:  [name] was previously seen in the [facility] due to a personal and family history of cancer and concerns regarding a hereditary predisposition to cancer. Please refer to our prior cancer genetics clinic note for more information regarding our discussion, assessment and recommendations, at the time. [name] recent genetic test results were disclosed to her, as were recommendations warranted by these results. These results and recommendations are discussed in more detail below. ? ?CANCER HISTORY:  ?Oncology History  ?Melanoma of skin ([place])  ?05/07/2008 Initial Diagnosis  ? Melanoma of skin ([place]) ?  ?05/31/2021 Genetic Testing  ? Negative genetic testing on the Multi-cancer gene panel + RNA.  EGFR c.1668A>G (p.Ile556Met) and PALB2 c.3296C>G (p.Thr1099Arg) VUS were identified.  The report date is May 31, 2021. ? ?The Multi-Gene Panel offered by [facility] includes sequencing and/or deletion duplication testing of the following 84 genes: AIP, ALK, APC, ATM, AXIN2,BAP1,  BARD1, BLM, BMPR1A, BRCA1, BRCA2, BRIP1, CASR, CDC73, CDH1, CDK4, CDKN1B, CDKN1C, CDKN2A (p14ARF), CDKN2A (p16INK4a), CEBPA, CHEK2, CTNNA1, DICER1, DIS3L2, EGFR (c.2369C>T, p.Thr790Met variant only), EPCAM (Deletion/duplication testing only), FH, FLCN, GATA2, GPC3, GREM1 (Promoter region deletion/duplication testing only), HOXB13 (c.251G>A, p.Gly84Glu), HRAS, KIT, MAX, MEN1, MET, MITF (c.952G>A, p.Glu318Lys variant only), MLH1, MSH2, MSH3, MSH6, MUTYH, NBN, NF1, NF2, NTHL1, PALB2, PDGFRA, PHOX2B, PMS2, POLD1, POLE, POT1, PRKAR1A, PTCH1, PTEN, RAD50, RAD51C, RAD51D, RB1, RECQL4, RET, RUNX1, SDHAF2, SDHA (sequence changes only), SDHB, SDHC, SDHD, SMAD4, SMARCA4, SMARCB1, SMARCE1, STK11, SUFU, TERC, TERT, TMEM127, TP53, TSC1, TSC2, VHL, WRN and WT1.   ?  ? ? ?FAMILY HISTORY:  ?We obtained a detailed, 4-generation family history.  Significant diagnoses are listed below: ?Family History  ?Problem Relation Age of Onset  ?  Hypertension Mother   ? Heart disease Mother   ? Anxiety disorder Mother   ? Breast cancer Mother 45  ? Heart disease Father   ?     atrial fib  ? Hypertension Father   ? Atrial fibrillation Father   ? Obesity Father   ? Bladder Cancer Father 36  ? Lymphoma Father 83  ? Diverticulitis Brother   ? Ovarian cancer Maternal Aunt 2  ? Breast cancer Maternal Grandmother 1  ? Heart disease Maternal Grandfather   ?     MI  ? Heart disease Paternal Grandfather   ?     MI  ? Leukemia Nephew 15  ?     AML and ALL  ? Colon cancer Neg Hx   ? Colon polyps Neg Hx   ? Esophageal cancer Neg Hx   ? Rectal cancer Neg Hx   ? Stomach cancer Neg Hx   ? ? ?  ?The patient has two daughters and a son who are cancer free.  She has three brothers who are cancer free.  One brother has a son who had both AML and ALL at age 30.  Her father is deceased and her mother is living. ?  ?The patient's mother was diagnosed with breast cancer at 53.  She has one sister who had ovarian cancer at 27 and was found to have a RAD51D mutation.  Her mother had breast cancer at 49, and she had a sister who had breast cancer at 68.  The patient's maternal grandmother's mother had ovarian cancer. ?  ?The patient's father had bladder cancer and died of lymphoma.  He had two sister and two brothers, none who had cancer.  The paternal grandparents are deceased. ?  ?[name] is unaware  of previous family history of genetic testing for hereditary cancer risks. Patient's maternal ancestors are of [place] descent, and paternal ancestors are of [place], [place] and [place] descent. There is no reported Ashkenazi Jewish ancestry. There is no known consanguinity. ? ?GENETIC TEST RESULTS: Genetic testing reported out on June 03, 2021 through the CancerNext-Expanded+RNAinsight cancer panel found no pathogenic mutations. The CancerNext-Expanded gene panel offered by [facility] and includes sequencing and rearrangement analysis for the following 77 genes: AIP, ALK, APC*,  ATM*, AXIN2, BAP1, BARD1, BLM, BMPR1A, BRCA1*, BRCA2*, BRIP1*, CDC73, CDH1*, CDK4, CDKN1B, CDKN2A, CHEK2*, CTNNA1, DICER1, FANCC, FH, FLCN, GALNT12, KIF1B, LZTR1, MAX, MEN1, MET, MLH1*, MSH2*, MSH3, MSH6*, MUTYH*, NBN, NF1*, NF2, NTHL1, PALB2*, PHOX2B, PMS2*, POT1, PRKAR1A, PTCH1, PTEN*, RAD51C*, RAD51D*, RB1, RECQL, RET, SDHA, SDHAF2, SDHB, SDHC, SDHD, SMAD4, SMARCA4, SMARCB1, SMARCE1, STK11, SUFU, TMEM127, TP53*, TSC1, TSC2, VHL and XRCC2 (sequencing and deletion/duplication); EGFR, EGLN1, HOXB13, KIT, MITF, PDGFRA, POLD1, and POLE (sequencing only); EPCAM and GREM1 (deletion/duplication only). DNA and RNA analyses performed for * genes. The test report has been scanned into [employer] and is located under the Molecular Pathology section of the Results Review tab.  A portion of the result report is included below for reference.  ? ? ? ?We discussed with [name] that because current genetic testing is not perfect, it is possible there may be a gene mutation in one of these genes that current testing cannot detect, but that chance is small.  We also discussed, that there could be another gene that has not yet been discovered, or that we have not yet tested, that is responsible for the cancer diagnoses in the family. It is also possible there is a hereditary cause for the cancer in the family that [name] did not inherit and therefore was not identified in her testing.  We do know that her maternal aunt has a pathogenic mutation in RAD51D, and we also know that her mother does not have this mutation.  Her mother is currently undergoing updated genetic testing and we will learn if there is anything further that we need to consider.  Therefore, it is important to remain in touch with cancer genetics in the future so that we can continue to offer [name] the most up to date genetic testing.  ? ?Genetic testing did identify two Variants of uncertain significance (VUS) - one in the EGFR gene called c.1668A>G and a  second in the PALB2 gene called c.3296C>G.  At this time, it is unknown if these variants are associated with increased cancer risk or if they are normal findings, but most variants such as these get reclassified to being inconsequential. They should not be used to make medical management decisions. With time, we suspect the lab will determine the significance of these variants, if any. If we do learn more about them, we will try to contact [name] to discuss it further. However, it is important to stay in touch with [place] periodically and keep the address and phone number up to date. ? ?ADDITIONAL GENETIC TESTING: We discussed with [name] that her genetic testing was fairly extensive.  If there are genes identified to increase cancer risk that can be analyzed in the future, we would be happy to discuss and coordinate this testing at that time.   ? ?CANCER SCREENING RECOMMENDATIONS: [name] test result is considered negative (normal).  This means that we have not identified a hereditary cause for her personal and family history of cancer at this  time. Most cancers happen by chance and this negative test suggests that her cancer may fall into this category.   ? ?While reassuring, this does not definitively rule out a hereditary predisposition to cancer. It is still possible that there could be genetic mutations that are undetectable by current technology. There could be genetic mutations in genes that have not been tested or identified to increase cancer risk.  Therefore, it is recommended she continue to follow the cancer management and screening guidelines provided by her primary healthcare provider.  ? ?An individual's cancer risk and medical management are not determined by genetic test results alone. Overall cancer risk assessment incorporates additional factors, including personal medical history, family history, and any available genetic information that may result in a personalized plan for cancer  prevention and surveillance ? ?RECOMMENDATIONS FOR FAMILY MEMBERS:  Individuals in this family might be at some increased risk of developing cancer, over the general population risk, simply due to the fa

## 2021-06-22 ENCOUNTER — Ambulatory Visit: Payer: 59 | Admitting: Plastic Surgery

## 2021-06-22 ENCOUNTER — Other Ambulatory Visit: Payer: Self-pay

## 2021-06-22 ENCOUNTER — Encounter: Payer: Self-pay | Admitting: Plastic Surgery

## 2021-06-22 VITALS — BP 116/79 | HR 94 | Ht 64.0 in | Wt 235.6 lb

## 2021-06-22 DIAGNOSIS — N62 Hypertrophy of breast: Secondary | ICD-10-CM | POA: Diagnosis not present

## 2021-06-22 DIAGNOSIS — M79629 Pain in unspecified upper arm: Secondary | ICD-10-CM

## 2021-06-22 DIAGNOSIS — M25511 Pain in right shoulder: Secondary | ICD-10-CM

## 2021-06-22 DIAGNOSIS — M546 Pain in thoracic spine: Secondary | ICD-10-CM

## 2021-06-22 DIAGNOSIS — M25512 Pain in left shoulder: Secondary | ICD-10-CM

## 2021-06-22 DIAGNOSIS — G8929 Other chronic pain: Secondary | ICD-10-CM

## 2021-06-22 NOTE — Progress Notes (Addendum)
Patient ID: [name], female    DOB: Aug 21, 1970, 51 y.o.   MRN: [id number]   Chief Complaint  Patient presents with   Breast Problem    Mammary Hyperplasia: The patient is a 51 y.o. female with a history of mammary hyperplasia for several years.  She has extremely large breasts causing symptoms that include the following: Back pain in the upper and lower back, including neck pain. She pulls or pins her bra straps to provide better lift and relief of the pressure and pain. She notices relief by holding her breast up manually.  Her shoulder straps cause grooves and pain and pressure that requires padding for relief. Pain medication is sometimes required with motrin and tylenol.  Activities that are hindered by enlarged breasts include: exercise and running.  She has tried supportive clothing as well as fitted bras without improvement.  Her breasts are extremely large and fairly symmetric.  She has hyperpigmentation of the inframammary area on both sides.  The sternal to nipple distance on the right is 32 cm and the left is 32 cm.  The IMF distance is 20 cm.  She is 5 feet 3 inches tall and weighs 235 pounds.  The BMI = 41.6 kg/m.  Preoperative bra size = L cup. She would like to be a C cup.  The estimated excess breast tissue to be removed at the time of surgery = 600-650 grams on the left and 600-650 grams on the right.  Mammogram history: 2023 and negative.  Family history of breast cancer:  maternal grandmother.  Tobacco use:  no.   The patient expresses the desire to pursue surgical intervention.  The patient has had a hysterectomy, oophorectomy and bilateral knee surgery.  She breast-fed all 3 children.  She [facility] but is willing to go back.   Review of Systems  Constitutional: Negative.   HENT: Negative.    Eyes: Negative.   Respiratory: Negative.    Cardiovascular: Negative.   Gastrointestinal: Negative.   Endocrine: Negative.    Genitourinary: Negative.   Musculoskeletal:  Positive for back pain and neck pain.  Skin:  Positive for rash.  Psychiatric/Behavioral: Negative.     Past Medical History:  Diagnosis Date   Acute mastitis of left breast 10/04/2007   Allergy    seasonal allergies   AMA (advanced maternal age) multigravida 35+    Cancer (HCC) 2002   Left leg melanoma   Celiac disease    Chronic back pain    H/O   Complication of anesthesia    Difficult time waking up   Constipation in pregnancy 03/2006   Endometriosis    h/o   Environmental allergies    Family history of breast cancer    Family history of ovarian cancer    Fibroid    During first pregnancy   Fibromyalgia    pt denies dx   First trimester bleeding 2008   GBS carrier    GERD (gastroesophageal reflux disease)    with certain foods-OTC PRN   Granuloma annulare    H/O amenorrhea 10/2006   H/O blood clots    7 yr ago in arm   H/O varicella    As an infant.   Headache(784.0)    Hearing loss 08/15/2016   Heart murmur    heard occassional at MD office   History of melanoma    HTN (hypertension)    on meds   Hyperlipidemia  03/02/2017   diet controlled   IBS (irritable bowel syndrome) 08/15/2016   Irregular uterine bleeding    Joint pain    Lactose intolerance    Leg edema    Leg pain, left 02/09/2016   Monilial vaginitis 07/27/2004   Palpitations    Rh negative status during pregnancy    SAB (spontaneous abortion)    h/o x 2   Seasonal allergies    Sleep apnea 08/15/2016   no CPAP use- uses nightguard   TMJ syndrome    Vegetarianism 08/15/2016   Vitamin D deficiency 08/16/2015    Past Surgical History:  Procedure Laterality Date   ABDOMINAL HYSTERECTOMY  03/01/2011   APPENDECTOMY  02/29/2000   DILATION AND EVACUATION     endocervical polyp removal  03/01/1999   ENDOMETRIAL ABLATION  02/29/2008   HERNIA REPAIR  05/16/2012   incisional hernia repair   HYSTEROSCOPY W/ ENDOMETRIAL ABLATION  01/28/2009    INSERTION OF MESH N/A 05/16/2012   Procedure: INSERTION OF MESH;  Surgeon: Maisie Fus A. Cornett, MD;  Location: [facility] OR;  Service: General;  Laterality: N/A;   KNEE ARTHROSCOPY Left 12/05/2012   Procedure: LEFT KNEE ARTHROSCOPY;  Surgeon: Nestor Lewandowsky, MD;  Location:  SURGERY CENTER;  Service: Orthopedics;  Laterality: Left;   MELANOMA EXCISION  02/29/2000   Left Leg   melanoma removal   02/28/2001   REPLACEMENT TOTAL KNEE BILATERAL Bilateral    SALPINGOOPHORECTOMY  12/08/2011   Procedure: SALPINGO OOPHERECTOMY;  Surgeon: Hal Morales, MD;  Location: [facility];  Service: Gynecology;  Laterality: Bilateral;   TENDON RELEASE  02/28/2002   uterine curettage  01/28/2009   VENTRAL HERNIA REPAIR N/A 05/16/2012   Procedure: LAPAROSCOPIC VENTRAL HERNIA;  Surgeon: Maisie Fus A. Cornett, MD;  Location: [facility] OR;  Service: General;  Laterality: N/A;   WISDOM TOOTH EXTRACTION  03/01/1987      Current Outpatient Medications:    lisinopril (ZESTRIL) 20 MG tablet, TAKE 1 TABLET BY MOUTH IN  THE MORNING AND AT BEDTIME, Disp: 180 tablet, Rfl: 1   amLODipine (NORVASC) 2.5 MG tablet, TAKE 1 TABLET BY MOUTH DAILY, Disp: 90 tablet, Rfl: 1   Cetirizine HCl (ZYRTEC PO), Take 1 tablet by mouth daily., Disp: , Rfl:    cholecalciferol (VITAMIN D3) 25 MCG (1000 UNIT) tablet, Take 1,000 Units by mouth daily., Disp: , Rfl:    estradiol (CLIMARA - DOSED IN MG/24 HR) 0.0375 mg/24hr patch, Place 0.0375 mg onto the skin once a week., Disp: , Rfl:    hydrochlorothiazide (HYDRODIURIL) 25 MG tablet, TAKE 1/2 TO 1 TABLET BY MOUTH DAILY, Disp: 30 tablet, Rfl: 3   Misc. Devices (DENTAL GUARD) MISC, 1 Device by Does not apply route See admin instructions., Disp: 1 each, Rfl: 0   Multiple Vitamins-Minerals (MULTIVITAMIN ADULT PO), Take by mouth., Disp: , Rfl:    TURMERIC CURCUMIN PO, Take by mouth., Disp: , Rfl:    Objective:   There were no vitals filed for this visit.  Physical Exam Vitals reviewed.  Constitutional:       Appearance: Normal appearance.  HENT:     Head: Normocephalic and atraumatic.  Cardiovascular:     Rate and Rhythm: Normal rate.     Pulses: Normal pulses.  Pulmonary:     Effort: Pulmonary effort is normal. No respiratory distress.  Abdominal:     General: There is no distension.     Palpations: Abdomen is soft.     Tenderness: There is no abdominal tenderness.  Musculoskeletal:  General: No swelling or deformity.  Skin:    General: Skin is warm.     Capillary Refill: Capillary refill takes less than 2 seconds.     Coloration: Skin is not jaundiced.     Findings: No bruising.  Neurological:     Mental Status: She is alert and oriented to person, place, and time.  Psychiatric:        Mood and Affect: Mood normal.        Behavior: Behavior normal.        Thought Content: Thought content normal.        Judgment: Judgment normal.    Assessment & Plan:  Chronic pain of both shoulders  Large breasts  Chronic midline thoracic back pain  Pain in axilla, unspecified laterality  The procedure the patient selected and that was best for the patient was discussed. The risk were discussed and include but not limited to the following:  Breast asymmetry, fluid accumulation, firmness of the breast, inability to breast feed, loss of nipple or areola, skin loss, change in skin and nipple sensation, fat necrosis of the breast tissue, bleeding, infection and healing delay.  There are risks of anesthesia and injury to nerves or blood vessels.  Allergic reaction to tape, suture and skin glue are possible.  There will be swelling.  Any of these can lead to the need for revisional surgery.  A breast reduction has potential to interfere with diagnostic procedures in the future.  This procedure is best done when the breast is fully developed.  Changes in the breast will continue to occur over time: pregnancy, weight gain or weigh loss.    Total time: 45 minutes. This includes time spent with  the patient during the visit as well as time spent before and after the visit reviewing the chart, documenting the encounter, ordering pertinent studies and literature for the patient.   Physical therapy:  ordered Mammogram:  done [facility]:  ordered  Pictures were obtained of the patient and placed in the chart with the patient's or guardian's permission.   The patient is a good candidate for bilateral breast reduction with liposuction.  She would like a quote as well as submitting this to insurance.  She is willing to go for [facility] again to see if there is something she can do to decrease her weight.  She works at and owns a [employer].  She is aware that she will need to take a couple weeks off of work after the surgery.  Alena Bills Marcile Fuquay, DO

## 2021-06-25 ENCOUNTER — Other Ambulatory Visit: Payer: Self-pay

## 2021-06-25 DIAGNOSIS — I10 Essential (primary) hypertension: Secondary | ICD-10-CM

## 2021-06-25 DIAGNOSIS — E669 Obesity, unspecified: Secondary | ICD-10-CM

## 2021-06-25 MED ORDER — AMLODIPINE BESYLATE 2.5 MG PO TABS: 2.5000 mg | ORAL_TABLET | Freq: Every day | ORAL | 1 refills | Status: DC

## 2021-06-25 MED ORDER — HYDROCHLOROTHIAZIDE 25 MG PO TABS: 12.5000 mg | ORAL_TABLET | Freq: Every day | ORAL | 1 refills | Status: DC

## 2021-06-29 ENCOUNTER — Institutional Professional Consult (permissible substitution): Payer: 59 | Admitting: Plastic Surgery

## 2021-07-09 ENCOUNTER — Telehealth: Payer: Self-pay | Admitting: Plastic Surgery

## 2021-07-09 NOTE — Telephone Encounter (Signed)
Lvm returning patient's call regarding questions she had. Advised her to call back so we could get her questions answered.  ?

## 2021-07-30 ENCOUNTER — Other Ambulatory Visit: Payer: Self-pay

## 2021-07-30 DIAGNOSIS — G8929 Other chronic pain: Secondary | ICD-10-CM

## 2021-07-30 DIAGNOSIS — N62 Hypertrophy of breast: Secondary | ICD-10-CM

## 2021-08-02 ENCOUNTER — Other Ambulatory Visit: Payer: Self-pay

## 2021-08-02 ENCOUNTER — Telehealth: Payer: Self-pay

## 2021-08-02 DIAGNOSIS — G8929 Other chronic pain: Secondary | ICD-10-CM

## 2021-08-02 DIAGNOSIS — N62 Hypertrophy of breast: Secondary | ICD-10-CM

## 2021-08-02 DIAGNOSIS — E669 Obesity, unspecified: Secondary | ICD-10-CM

## 2021-08-02 NOTE — Telephone Encounter (Addendum)
Faxed referral to [facility]

## 2021-08-19 ENCOUNTER — Telehealth: Payer: Self-pay

## 2021-08-19 NOTE — Telephone Encounter (Signed)
Called patient, LMVM inquiring the status of her PT, and HWW and if we need to push her follow up appointment out further then 08/26/2021. If so, she can call our office and the front desk can reschedule as needed.

## 2021-08-26 ENCOUNTER — Ambulatory Visit: Payer: 59 | Admitting: Surgical

## 2021-08-26 ENCOUNTER — Telehealth: Payer: Self-pay

## 2021-08-26 IMAGING — MR MR KNEE*R* W/O CM
4 of 6 series · 23 of 40 positions shown · non-contrast
Comparison: X-ray 12/13/2018

CLINICAL DATA: Left knee pain for 2 months

EXAM:
MRI OF THE RIGHT KNEE WITHOUT CONTRAST
TECHNIQUE: Multiplanar, multisequence MR imaging of the knee was performed. No
intravenous contrast was administered.

[Series 4: T1 · coronal · 4.0mm · 0.29mm/px · 5 of 29 slices shown]
[im 1/29]
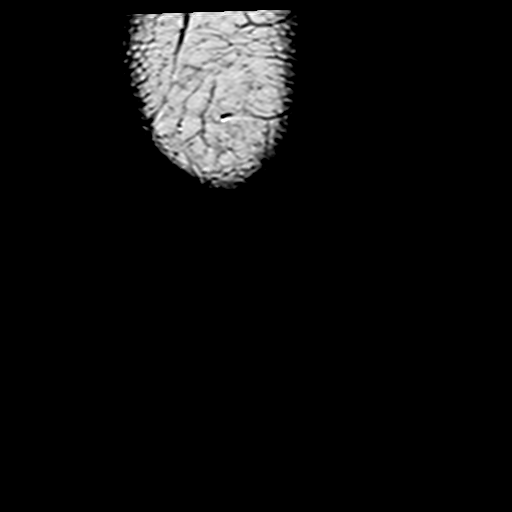
[im 5/29]
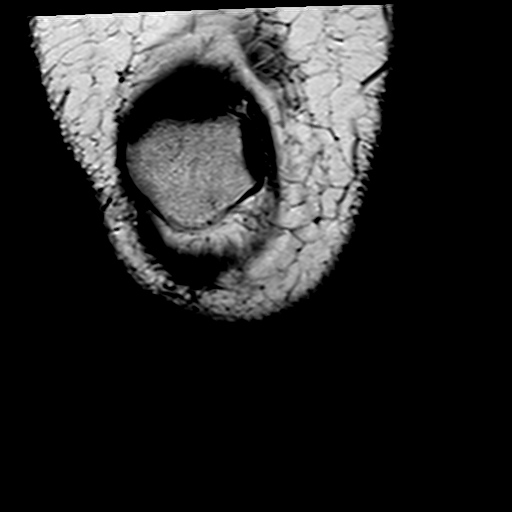
[im 10/29]
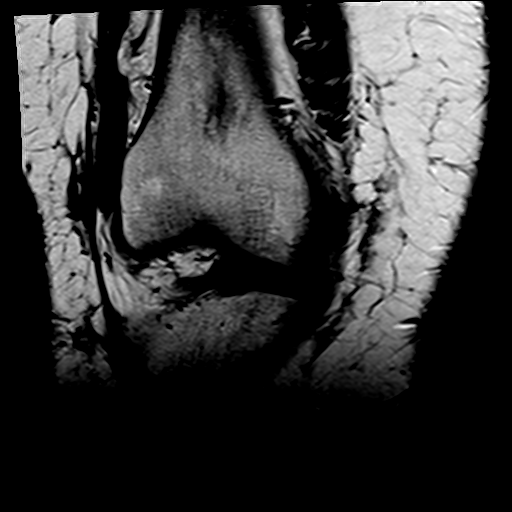
[im 15/29]
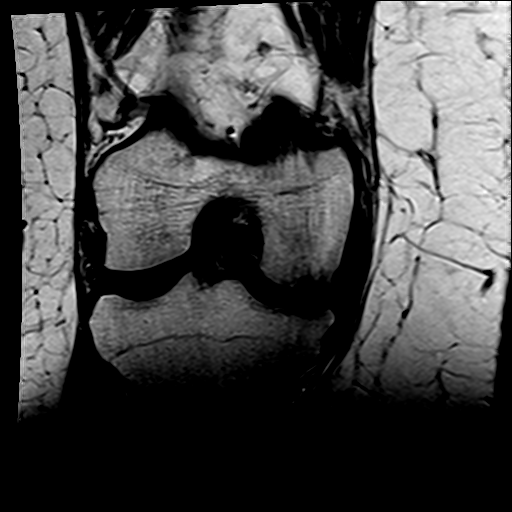
[im 24/29]
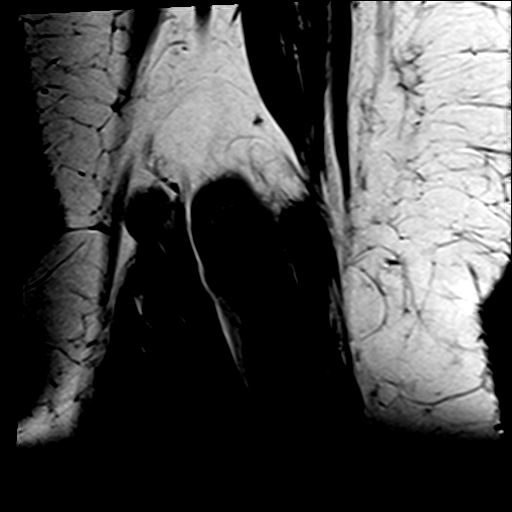

[Series 5: T2 fat-sat · coronal · 4.0mm · 0.59mm/px · 6 of 25 slices shown (1 of 2)]
[im 1/25]
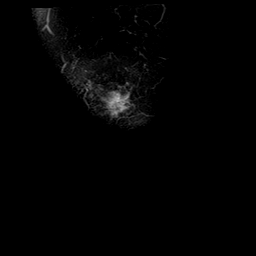
[im 5/25]
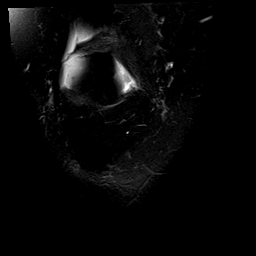
[im 10/25]
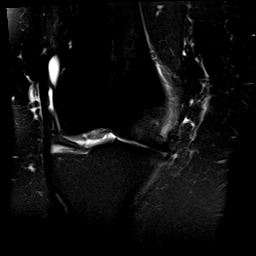
[im 15/25]
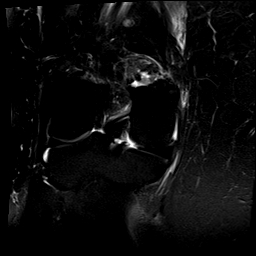
[im 20/25]
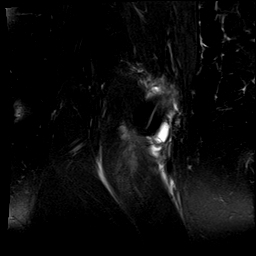
[im 25/25]
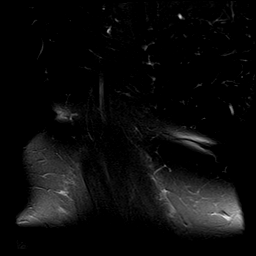

[Series 7: PD fat-sat · sagittal · 3.0mm · 0.29mm/px · 6 of 25 slices shown]
[im 1/25]
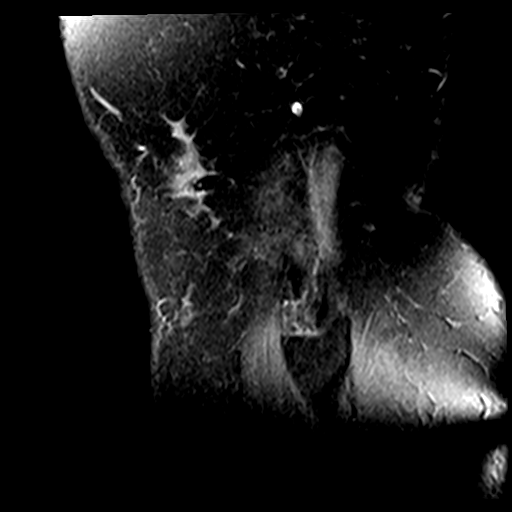
[im 5/25]
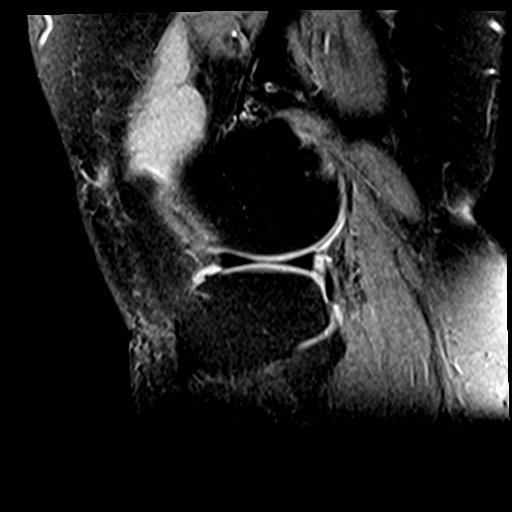
[im 10/25]
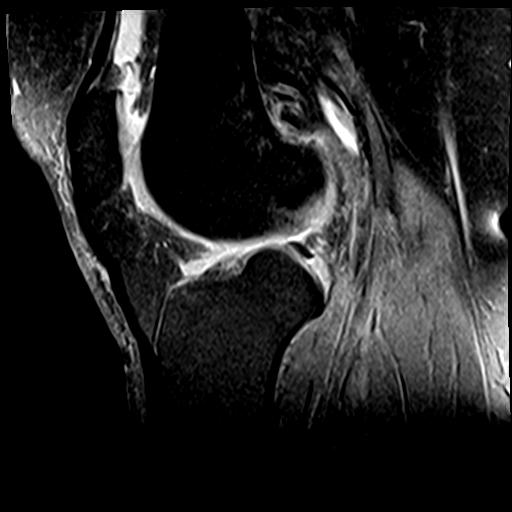
[im 15/25]
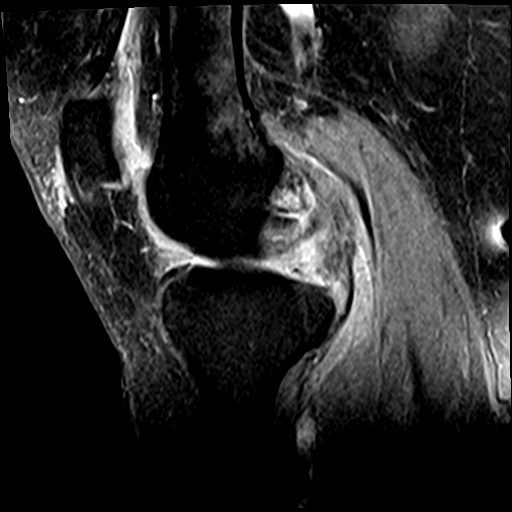
[im 20/25]
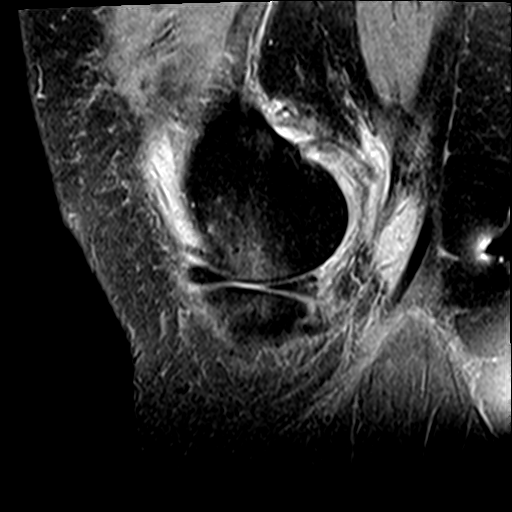
[im 25/25]
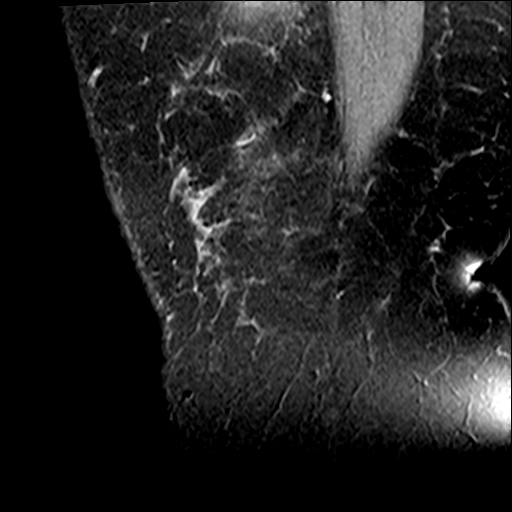

[Series 8: T2 fat-sat · sagittal · 3.0mm · 0.29mm/px · 6 of 25 slices shown (2 of 2)]
[im 1/25]
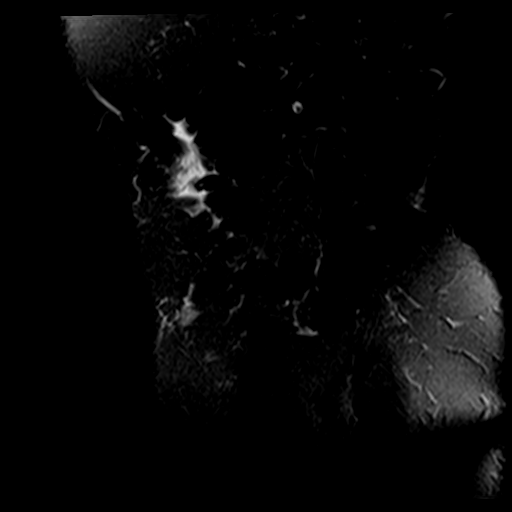
[im 5/25]
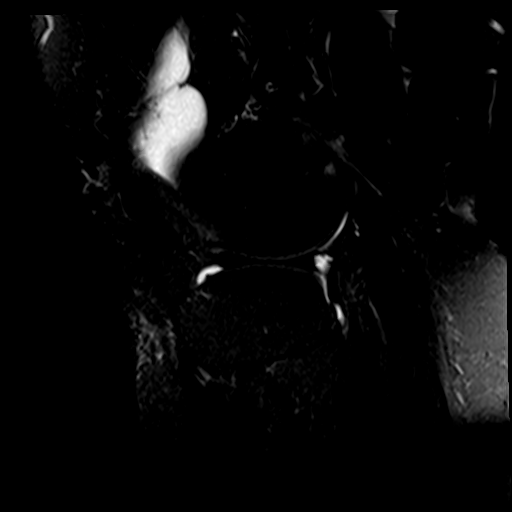
[im 10/25]
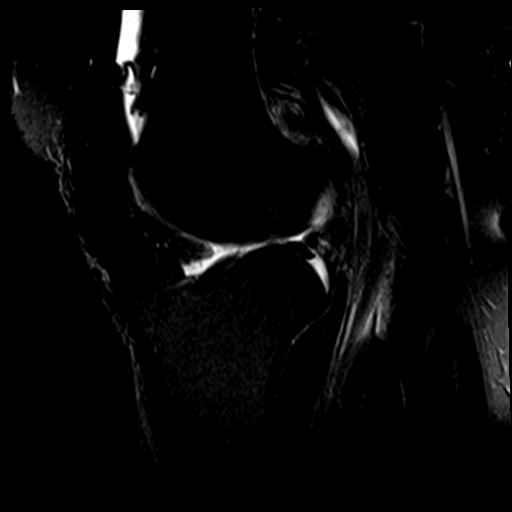
[im 15/25]
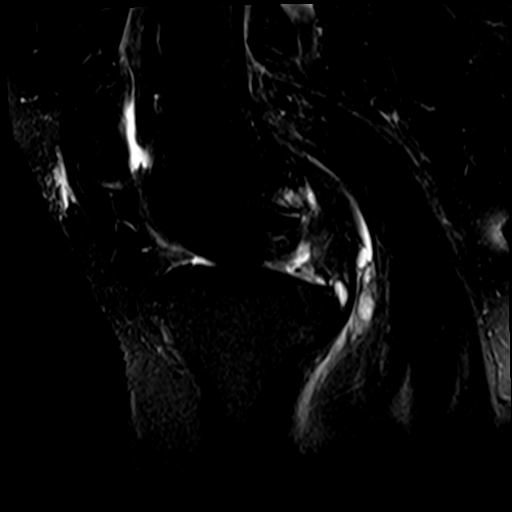
[im 20/25]
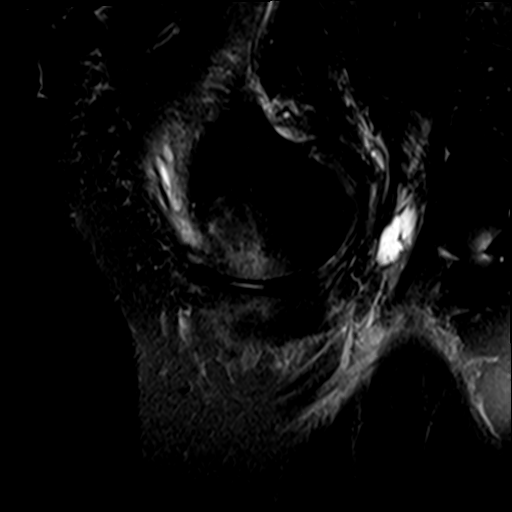
[im 25/25]
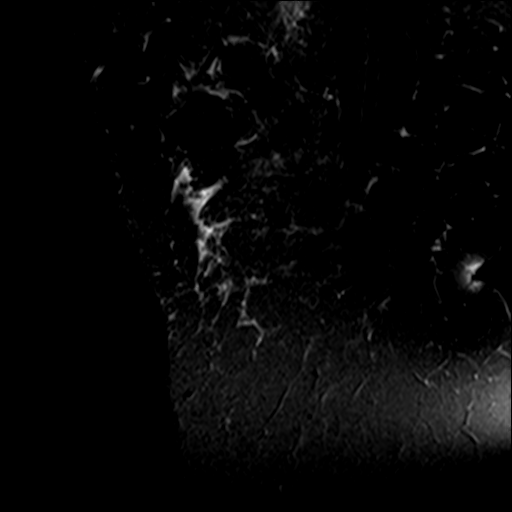

[23 of 40 positions shown; findings below may reference images not displayed]

FINDINGS: MENISCI

Medial meniscus: Complete radial tear of the posterior horn of the
medial meniscus just medial to the posterior root attachment site
(series 6, image 13). Intrasubstance degeneration of the medial
meniscal body, which is medially extruded.

Lateral meniscus:  Intact.

LIGAMENTS

Cruciates:  Intact ACL and PCL.

Collaterals: Intact MCL with mild periligamentous edema, which may
be reactive secondary to adjacent meniscal pathology. Lateral
collateral ligament complex intact.

CARTILAGE

Patellofemoral: Mild-moderate chondral thinning and surface
irregularity most pronounced within the trochlear groove.

Medial: Moderate diffuse cartilage thinning and surface irregularity
of the weight-bearing medial compartment with areas of
full-thickness chondral fissuring and subchondral marrow edema.

Lateral:  No chondral defect.

Joint: Small-moderate knee joint effusion. Fat pads within normal
limits.

Popliteal Fossa: Small complex Baker's cyst with inferior
leakage/rupture. Intact popliteus tendon.

Extensor Mechanism:  Intact quadriceps tendon and patellar tendon.

Bones: Medial compartment joint space narrowing with subchondral
marrow edema at the peripheral aspects of the medial femoral condyle
and medial tibial plateau. No discernible subchondral fracture line.
Remaining osseous structures within normal limits. No suspicious
bone lesion.

Other: Nonspecific circumferential subcutaneous edema.
IMPRESSION: 1. Complete radial tear of the posterior horn of the medial meniscus
adjacent to the posterior root attachment site.
2. Grade 1 MCL sprain.
3. Moderate medial compartment osteoarthritis.
4. Small-to-moderate knee joint effusion. Small complex Baker's cyst
with inferior leakage/rupture.

## 2021-08-26 NOTE — Telephone Encounter (Signed)
Spoke with patient advised that her policy is reviewed after surgery is performed. Explained even though the surgery may not need authorization, there is still a chance for it to be denied. Patient would like for it to be submitted to insurance without having PT and HWW.  She has a quote from 4/23

## 2021-08-30 ENCOUNTER — Telehealth: Payer: Self-pay

## 2021-08-30 NOTE — Telephone Encounter (Signed)
Called paitent, LMVM surgery was approved. Ready to schedule surgery.

## 2021-09-03 ENCOUNTER — Encounter: Payer: Self-pay | Admitting: Family Medicine

## 2021-10-26 ENCOUNTER — Telehealth: Payer: Self-pay

## 2021-10-26 NOTE — Telephone Encounter (Signed)
Patient states she has a dental procedure on 11/03/21 and wants to know if it will interfere with the surgery on 9/27.

## 2021-10-27 NOTE — Telephone Encounter (Signed)
Pt is aware of provider's response and conveyed understanding.

## 2021-11-05 ENCOUNTER — Other Ambulatory Visit: Payer: Self-pay | Admitting: Family Medicine

## 2021-11-05 DIAGNOSIS — I10 Essential (primary) hypertension: Secondary | ICD-10-CM

## 2021-11-08 ENCOUNTER — Telehealth: Payer: Self-pay | Admitting: *Deleted

## 2021-11-08 NOTE — Telephone Encounter (Signed)
LVM to see if patient would be willing to bump surgery on 9/27 to 730 start.

## 2021-11-15 ENCOUNTER — Encounter (HOSPITAL_BASED_OUTPATIENT_CLINIC_OR_DEPARTMENT_OTHER): Payer: Self-pay | Admitting: Plastic Surgery

## 2021-11-16 ENCOUNTER — Encounter (HOSPITAL_BASED_OUTPATIENT_CLINIC_OR_DEPARTMENT_OTHER)
Admission: RE | Admit: 2021-11-16 | Discharge: 2021-11-16 | Disposition: A | Discharge status: Home / Self Care | Payer: 59 | Source: Ambulatory Visit | Attending: Plastic Surgery | Admitting: Plastic Surgery

## 2021-11-16 ENCOUNTER — Encounter: Payer: Self-pay | Admitting: Student

## 2021-11-16 ENCOUNTER — Other Ambulatory Visit: Payer: Self-pay | Admitting: Student

## 2021-11-16 ENCOUNTER — Encounter: Payer: 59 | Admitting: Physician Assistant

## 2021-11-16 ENCOUNTER — Ambulatory Visit (INDEPENDENT_AMBULATORY_CARE_PROVIDER_SITE_OTHER): Payer: 59 | Admitting: Student

## 2021-11-16 VITALS — BP 121/75 | HR 79 | Temp 98.6°F | Resp 18 | Ht 64.0 in | Wt 219.0 lb

## 2021-11-16 DIAGNOSIS — N62 Hypertrophy of breast: Secondary | ICD-10-CM

## 2021-11-16 DIAGNOSIS — M25511 Pain in right shoulder: Secondary | ICD-10-CM

## 2021-11-16 DIAGNOSIS — G8929 Other chronic pain: Secondary | ICD-10-CM

## 2021-11-16 DIAGNOSIS — Z01818 Encounter for other preprocedural examination: Secondary | ICD-10-CM | POA: Insufficient documentation

## 2021-11-16 DIAGNOSIS — M546 Pain in thoracic spine: Secondary | ICD-10-CM

## 2021-11-16 DIAGNOSIS — M25512 Pain in left shoulder: Secondary | ICD-10-CM

## 2021-11-16 LAB — BASIC METABOLIC PANEL
Anion gap: 9 (ref 5–15)
BUN: 9 mg/dL (ref 6–20)
CO2: 25 mmol/L (ref 22–32)
Calcium: 9.8 mg/dL (ref 8.9–10.3)
Chloride: 104 mmol/L (ref 98–111)
Creatinine, Ser: 0.57 mg/dL (ref 0.44–1.00)
GFR, Estimated: >60 mL/min (ref >60)
Glucose, Bld: 99 mg/dL (ref 70–99)
Potassium: 3.9 mmol/L (ref 3.5–5.1)
Sodium: 138 mmol/L (ref 135–145)

## 2021-11-16 MED ORDER — OXYCODONE HCL 5 MG PO TABS: 5.0000 mg | ORAL_TABLET | Freq: Three times a day (TID) | ORAL | 0 refills | Status: DC | PRN

## 2021-11-16 MED ORDER — CEPHALEXIN 500 MG PO CAPS: 500.0000 mg | ORAL_CAPSULE | Freq: Four times a day (QID) | ORAL | 0 refills | Status: DC

## 2021-11-16 MED ORDER — ONDANSETRON HCL 4 MG PO TABS: 4.0000 mg | ORAL_TABLET | Freq: Three times a day (TID) | ORAL | 0 refills | Status: DC | PRN

## 2021-11-16 NOTE — Progress Notes (Signed)
Medication error was occurring with trying to send patient's postoperative medications for her upcoming surgery.  I called the pharmacy, and they received the Keflex and Zofran, but did not receive the oxycodone.  I called [facility] as I kept attempting to send oxycodone, and it was not operating in the same manner as it usually does when a narcotic is sent.  I discussed with [facility] and they stated the medications were not sent anywhere and were not printed anywhere.  The medications were removed from the medication list because they have been discontinued.  [facility] recommended I logout of the patient's chart and log back in, and reorder the medication.  I did so, and the oxycodone was able to be sent to the pharmacy.  I called the pharmacy and confirm that they received the oxycodone.

## 2021-11-16 NOTE — Progress Notes (Unsigned)
Patient ID: [name], female    DOB: 06-May-1970, 51 y.o.   MRN: [id number]  No chief complaint on file.   No diagnosis found.   History of Present Illness: [name] is a 51 y.o.  female  with a history of macromastia.  She presents for preoperative evaluation for upcoming procedure, Bilateral Breast Reduction with liposuction, scheduled for 11/24/2021 with Dr.  Ulice Bold  The patient has not had problems with anesthesia.  Patient reports she had a mammogram in January that was negative.  Patient denies any personal history of breast cancer.  She states that she has family history of breast cancer including her mom and her grandmother.  Patient denies any history of cardiac disease.  She denies taking any blood thinners.  Patient states that she used to smoke, but has not smoked in 1 year.  Patient states that she is on an estrogen patch given her history of hysterectomy and oophorectomy.  Patient reports she has had 2 miscarriages before.  She denies any personal or family history of blood clots or clotting diseases.  Patient denies any recent surgeries, traumas, infections, pregnancies, strokes or heart attacks.  Patient denies any history of inflammatory bowel disease, lung disease or cancer.  Patient denies any varicose veins.  Patient denies any recent fevers, chills, shortness of breath.  Patient denies any recent changes in her health.  Patient reports that her current cup size is an L cup.  Patient states that she would like to be a C cup.  Summary of Previous Visit: Patient was seen for consult by Dr. Ulice Bold on 06/22/2021.  At this visit, she complained of symptoms including back pain, neck pain and activities hindered by her enlarged breast.  Her STN was found to be 32 cm on the right and 32 cm on the left.  Her BMI was 41.6 kg/m.  Her preoperative bra size was in L cup.  Patient reported at this visit she would like to be a C cup.  Patient expressed the desire to  pursue surgical intervention.  Patient was found to be a good candidate for bilateral breast reduction with liposuction.  Estimated excess breast tissue to be removed at time of surgery: 600-650 grams  Job: Patient states she works at a [employer] as a Production designer, theatre/television/film in Engineer, site.  She states she plans to take 1 to 2 weeks off, and then transition to desk duty.  PMH Significant for: HTN, GERD, mammary hypertrophy   Patient reports that she has an allergy to Betadine.  She states that she also has a reaction to some tapes and other surgical preps.  She states that she has never had an anaphylactic reaction to tapes or preps.  She states that she does get some itchiness and bumps in the area.   Past Medical History: Allergies: Allergies  Allergen Reactions   Cymbalta [Duloxetine Hcl] Other (See Comments)    Makes her feel crazy, out of body   Betadine [Povidone Iodine] Other (See Comments)    Dx with allergy testing   Iodine    Shellfish Allergy Other (See Comments)    Dx with allergy testing    Current Medications:  Current Outpatient Medications:    amLODipine (NORVASC) 2.5 MG tablet, Take 1 tablet (2.5 mg total) by mouth daily., Disp: 90 tablet, Rfl: 0   Ascorbic Acid (VITAMIN C) 100 MG tablet, Take 100 mg by mouth daily., Disp: , Rfl:    Cetirizine HCl (ZYRTEC  PO), Take 1 tablet by mouth daily., Disp: , Rfl:    cholecalciferol (VITAMIN D3) 25 MCG (1000 UNIT) tablet, Take 1,000 Units by mouth daily., Disp: , Rfl:    Cyanocobalamin (VITAMIN B 12 PO), Take by mouth., Disp: , Rfl:    estradiol (CLIMARA - DOSED IN MG/24 HR) 0.0375 mg/24hr patch, Place 0.0375 mg onto the skin once a week., Disp: , Rfl:    hydrochlorothiazide (HYDRODIURIL) 25 MG tablet, Take 0.5-1 tablets (12.5-25 mg total) by mouth daily., Disp: 90 tablet, Rfl: 0   lisinopril (ZESTRIL) 20 MG tablet, TAKE 1 TABLET BY MOUTH IN  THE MORNING AND AT BEDTIME, Disp: 180 tablet, Rfl: 1   Misc. Devices (DENTAL GUARD) MISC, 1 Device by  Does not apply route See admin instructions., Disp: 1 each, Rfl: 0   TURMERIC CURCUMIN PO, Take by mouth., Disp: , Rfl:   Past Medical Problems: Past Medical History:  Diagnosis Date   Acute mastitis of left breast 10/04/2007   Allergy    seasonal allergies   AMA (advanced maternal age) multigravida 35+    Cancer (HCC) 2002   Left leg melanoma   Celiac disease    Chronic back pain    H/O   Complication of anesthesia    Difficult time waking up   Constipation in pregnancy 03/2006   Endometriosis    h/o   Environmental allergies    Family history of breast cancer    Family history of ovarian cancer    Fibroid    During first pregnancy   Fibromyalgia    pt denies dx   First trimester bleeding 2008   GBS carrier    GERD (gastroesophageal reflux disease)    with certain foods-OTC PRN   Granuloma annulare    H/O amenorrhea 10/2006   H/O blood clots    7 yr ago in arm   H/O varicella    As an infant.   Headache(784.0)    Hearing loss 08/15/2016   Heart murmur    heard occassional at MD office   History of melanoma    HTN (hypertension)    on meds   Hyperlipidemia 03/02/2017   diet controlled   IBS (irritable bowel syndrome) 08/15/2016   Irregular uterine bleeding    Joint pain    Lactose intolerance    Leg edema    Leg pain, left 02/09/2016   Monilial vaginitis 07/27/2004   Palpitations    Rh negative status during pregnancy    SAB (spontaneous abortion)    h/o x 2   Seasonal allergies    Sleep apnea 08/15/2016   no CPAP use- uses nightguard   TMJ syndrome    Vegetarianism 08/15/2016   Vitamin D deficiency 08/16/2015    Past Surgical History: Past Surgical History:  Procedure Laterality Date   ABDOMINAL HYSTERECTOMY  03/01/2011   APPENDECTOMY  02/29/2000   DILATION AND EVACUATION     endocervical polyp removal  03/01/1999   ENDOMETRIAL ABLATION  02/29/2008   HERNIA REPAIR  05/16/2012   incisional hernia repair   HYSTEROSCOPY W/ ENDOMETRIAL ABLATION   01/28/2009   INSERTION OF MESH N/A 05/16/2012   Procedure: INSERTION OF MESH;  Surgeon: Maisie Fus A. Cornett, MD;  Location: [facility];  Service: General;  Laterality: N/A;   KNEE ARTHROSCOPY Left 12/05/2012   Procedure: LEFT KNEE ARTHROSCOPY;  Surgeon: Nestor Lewandowsky, MD;  Location: [facility];  Service: Orthopedics;  Laterality: Left;   MELANOMA EXCISION  02/29/2000   Left  Leg   melanoma removal   02/28/2001   REPLACEMENT TOTAL KNEE BILATERAL Bilateral    SALPINGOOPHORECTOMY  12/08/2011   Procedure: SALPINGO OOPHERECTOMY;  Surgeon: Hal Morales, MD;  Location: [facility];  Service: Gynecology;  Laterality: Bilateral;   TENDON RELEASE  02/28/2002   uterine curettage  01/28/2009   VENTRAL HERNIA REPAIR N/A 05/16/2012   Procedure: LAPAROSCOPIC VENTRAL HERNIA;  Surgeon: Maisie Fus A. Cornett, MD;  Location: [facility];  Service: General;  Laterality: N/A;   WISDOM TOOTH EXTRACTION  03/01/1987    Social History: Social History   Socioeconomic History   Marital status: Married    Spouse name: Mining engineer   Number of children: 3   Years of education: Not on file   Highest education level: Not on file  Occupational History   Occupation: Psychologist, sport and exercise  Tobacco Use   Smoking status: Former    Types: Cigarettes    Quit date: 04/26/2020    Years since quitting: 1.5   Smokeless tobacco: Never   Tobacco comments:    1-3 cigarrettes per week  Vaping Use   Vaping Use: Never used  Substance and Sexual Activity   Alcohol use: No   Drug use: No   Sexual activity: Yes    Birth control/protection: Surgical    Comment: BTL  Other Topics Concern   Not on file  Social History Narrative   ** Merged History Encounter **       Lives with husband and 3 children 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 Wears seat belt   Social Determinants of Health   Financial Resource Strain: Not on file  Food Insecurity: Not on file  Transportation Needs: Not on file  Physical Activity: Not on  file  Stress: Not on file  Social Connections: Not on file  Intimate Partner Violence: Not on file    Family History: Family History  Problem Relation Age of Onset   Hypertension Mother    Heart disease Mother    Anxiety disorder Mother    Breast cancer Mother 86   Heart disease Father        atrial fib   Hypertension Father    Atrial fibrillation Father    Obesity Father    Bladder Cancer Father 9   Lymphoma Father 52   Diverticulitis Brother    Ovarian cancer Maternal Aunt 50   Breast cancer Maternal Grandmother 26   Heart disease Maternal Grandfather        MI   Heart disease Paternal Grandfather        MI   Leukemia Nephew 15       AML and ALL   Colon cancer Neg Hx    Colon polyps Neg Hx    Esophageal cancer Neg Hx    Rectal cancer Neg Hx    Stomach cancer Neg Hx     Review of Systems: Denies fevers, chills or shortness of breath.  Denies any recent changes in her health.  Physical Exam: Vital Signs LMP 11/19/2010   Physical Exam  Constitutional:      General: Not in acute distress.    Appearance: Normal appearance. Not ill-appearing.  HENT:     Head: Normocephalic and atraumatic.  Neck:     Musculoskeletal: Normal range of motion.  Cardiovascular:     Rate and Rhythm: Normal rate Pulmonary:     Effort: Pulmonary effort is normal. No respiratory distress.  Musculoskeletal: Normal range of motion.  Skin:    General: Skin is  warm and dry.     Findings: No erythema or rash.  Neurological:     Mental Status: Alert and oriented to person, place, and time. Mental status is at baseline.  Psychiatric:        Mood and Affect: Mood normal.        Behavior: Behavior normal.    Assessment/Plan: The patient is scheduled for bilateral breast reduction with Dr. Ulice Bold.  Risks, benefits, and alternatives of procedure discussed, questions answered and consent obtained.    Smoking Status: Has not smoked in 1 year; Counseling Given?  Discussed with patient  to continue to be tobacco free. Last Mammogram: January 2023; Results: Negative per patient  Caprini Score: 5; Risk Factors include: Age, hormone replacement, BMI greater than 25, and length of planned surgery. Recommendation for mechanical prophylaxis. Encourage early ambulation.   Pictures obtained: @consult   Post-op Rx sent to pharmacy:  Oxycodone, Zofran, Keflex  -I confirm with the pharmacy that they have received all 3 of these medications.  I instructed the patient to hold her turmeric prior to surgery.  I discussed with the patient to hold her HCTZ and her lisinopril the morning of surgery.  I discussed with the patient to hold any over-the-counter supplements or vitamins that she may be taking.  Patient expressed understanding.  I discussed with the patient that she may be at a slightly increased risk of DVT given the use of her estradiol patch.  I told the patient I would discuss with Dr. Ulice Bold regards to holding it and send a clearance to her GYN if we were to hold it.  Patient was provided with the breast reduction and General Surgical Risk consent document and Pain Medication Agreement prior to their appointment.  They had adequate time to read through the risk consent documents and Pain Medication Agreement. We also discussed them in person together during this preop appointment. All of their questions were answered to their satisfaction.  Recommended calling if they have any further questions.  Risk consent form and Pain Medication Agreement to be scanned into patient's chart.  The risk that can be encountered with breast reduction were discussed and include the following but not limited to these:  Breast asymmetry, fluid accumulation, firmness of the breast, inability to breast feed, loss of nipple or areola, skin loss, decrease or no nipple sensation, fat necrosis of the breast tissue, bleeding, infection, healing delay.  There are risks of anesthesia, changes to skin sensation  and injury to nerves or blood vessels.  The muscle can be temporarily or permanently injured.  You may have an allergic reaction to tape, suture, glue, blood products which can result in skin discoloration, swelling, pain, skin lesions, poor healing.  Any of these can lead to the need for revisonal surgery or stage procedures.  A reduction has potential to interfere with diagnostic procedures.  Nipple or breast piercing can increase risks of infection.  This procedure is best done when the breast is fully developed.  Changes in the breast will continue to occur over time.  Pregnancy can alter the outcomes of previous breast reduction surgery, weight gain and weigh loss can also effect the long term appearance.   The risks that can be encountered with and after liposuction were discussed and include the following but no limited to these:  Asymmetry, fluid accumulation, firmness of the area, fat necrosis with death of fat tissue, bleeding, infection, delayed healing, anesthesia risks, skin sensation changes, injury to structures including nerves, blood vessels,  and muscles which may be temporary or permanent, allergies to tape, suture materials and glues, blood products, topical preparations or injected agents, skin and contour irregularities, skin discoloration and swelling, deep vein thrombosis, cardiac and pulmonary complications, pain, which may persist, persistent pain, recurrence of the lesion, poor healing of the incision, possible need for revisional surgery or staged procedures. Thiere can also be persistent swelling, poor wound healing, rippling or loose skin, worsening of cellulite, swelling, and thermal burn or heat injury from ultrasound with the ultrasound-assisted lipoplasty technique. Any change in weight fluctuations can alter the outcome.     Electronically signed by: Laurena Spies, PA-C 11/16/2021 7:41 AM

## 2021-11-16 NOTE — H&P (View-Only) (Signed)
Patient ID: [name], female    DOB: 1970-06-29, 51 y.o.   MRN: [id number]  Chief Complaint  Patient presents with   Pre-op Exam      ICD-10-CM   1. Chronic pain of both shoulders  M25.511    G89.29    M25.512     2. Chronic midline thoracic back pain  M54.6    G89.29        History of Present Illness: [name] is a 51 y.o.  female  with a history of macromastia.  She presents for preoperative evaluation for upcoming procedure, Bilateral Breast Reduction with liposuction, scheduled for 11/24/2021 with Dr.  Marla Roe  The patient has not had problems with anesthesia.  Patient reports she had a mammogram in January that was negative.  Patient denies any personal history of breast cancer.  She states that she has family history of breast cancer including her mom and her grandmother.  Patient denies any history of cardiac disease.  She denies taking any blood thinners.  Patient states that she used to smoke, but has not smoked in 1 year.  Patient states that she is on an estrogen patch given her history of hysterectomy and oophorectomy.  Patient reports she has had 2 miscarriages before.  She denies any personal or family history of blood clots or clotting diseases.  Patient denies any recent surgeries, traumas, infections, pregnancies, strokes or heart attacks.  Patient denies any history of inflammatory bowel disease, lung disease or cancer.  Patient denies any varicose veins.  Patient denies any recent fevers, chills, shortness of breath.  Patient denies any recent changes in her health.  Patient reports that her current cup size is an L cup.  Patient states that she would like to be a C cup.  Summary of Previous Visit: Patient was seen for consult by Dr. Marla Roe on 06/22/2021.  At this visit, she complained of symptoms including back pain, neck pain and activities hindered by her enlarged breast.  Her STN was found to be 32 cm on the right and 32 cm on the left.  Her  BMI was 41.6 kg/m.  Her preoperative bra size was in L cup.  Patient reported at this visit she would like to be a C cup.  Patient expressed the desire to pursue surgical intervention.  Patient was found to be a good candidate for bilateral breast reduction with liposuction.  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  She states she plans to take 1 to 2 weeks off, and then transition to desk duty.  PMH Significant for: HTN, GERD, mammary hypertrophy   Patient reports that she has an allergy to Betadine.  She states that she also has a reaction to some tapes and other surgical preps.  She states that she has never had an anaphylactic reaction to tapes or preps.  She states that she does get some itchiness and bumps in the area.   Past Medical History: Allergies: Allergies  Allergen Reactions   Cymbalta [Duloxetine Hcl] Other (See Comments)    Makes her feel crazy, out of body   Betadine [Povidone Iodine] Other (See Comments)    Dx with allergy testing   Iodine    Shellfish Allergy Other (See Comments)    Dx with allergy testing    Current Medications:  Current Outpatient Medications:  amLODipine (NORVASC) 2.5 MG tablet, Take 1 tablet (2.5 mg total) by mouth daily., Disp: 90 tablet, Rfl: 0   Ascorbic Acid (VITAMIN C PO), Take by mouth., Disp: , Rfl:    Ascorbic Acid (VITAMIN C) 100 MG tablet, Take 100 mg by mouth daily., Disp: , Rfl:    Cetirizine HCl (ZYRTEC PO), Take 1 tablet by mouth daily., Disp: , Rfl:    cholecalciferol (VITAMIN D3) 25 MCG (1000 UNIT) tablet, Take 1,000 Units by mouth daily., Disp: , Rfl:    Cyanocobalamin (VITAMIN B 12 PO), Take by mouth., Disp: , Rfl:    estradiol (CLIMARA - DOSED IN MG/24 HR) 0.0375 mg/24hr patch, Place 0.0375 mg onto the skin once a week., Disp: , Rfl:    hydrochlorothiazide (HYDRODIURIL) 25 MG tablet, Take 0.5-1 tablets (12.5-25  mg total) by mouth daily., Disp: 90 tablet, Rfl: 0   lisinopril (ZESTRIL) 20 MG tablet, TAKE 1 TABLET BY MOUTH IN  THE MORNING AND AT BEDTIME, Disp: 180 tablet, Rfl: 1   MELATONIN PO, Take by mouth., Disp: , Rfl:    Misc. Devices (DENTAL GUARD) MISC, 1 Device by Does not apply route See admin instructions., Disp: 1 each, Rfl: 0   oxyCODONE (ROXICODONE) 5 MG immediate release tablet, Take 1 tablet (5 mg total) by mouth every 8 (eight) hours as needed for up to 20 doses for severe pain., Disp: 20 tablet, Rfl: 0   TURMERIC CURCUMIN PO, Take by mouth., Disp: , Rfl:   Past Medical Problems: Past Medical History:  Diagnosis Date   Acute mastitis of left breast 10/04/2007   Allergy    seasonal allergies   AMA (advanced maternal age) multigravida 35+    Cancer ([place]) 2002   Left leg melanoma   Celiac disease    Chronic back pain    H/O   Complication of anesthesia    Difficult time waking up   Constipation in pregnancy 03/2006   Endometriosis    h/o   Environmental allergies    Family history of breast cancer    Family history of ovarian cancer    Fibroid    During first pregnancy   Fibromyalgia    pt denies dx   First trimester bleeding 2008   GBS carrier    GERD (gastroesophageal reflux disease)    with certain foods-OTC PRN   Granuloma annulare    H/O amenorrhea 10/2006   H/O blood clots    7 yr ago in arm   H/O varicella    As an infant.   Headache(784.0)    Hearing loss 08/15/2016   Heart murmur    heard occassional at MD office   History of melanoma    HTN (hypertension)    on meds   Hyperlipidemia 03/02/2017   diet controlled   IBS (irritable bowel syndrome) 08/15/2016   Irregular uterine bleeding    Joint pain    Lactose intolerance    Leg edema    Leg pain, left 02/09/2016   Monilial vaginitis 07/27/2004   Palpitations    Rh negative status during pregnancy    SAB (spontaneous abortion)    h/o x 2   Seasonal allergies    Sleep apnea 08/15/2016   no CPAP  use- uses nightguard   TMJ syndrome    Vegetarianism 08/15/2016   Vitamin D deficiency 08/16/2015    Past Surgical History: Past Surgical History:  Procedure Laterality Date   ABDOMINAL HYSTERECTOMY  03/01/2011   APPENDECTOMY  02/29/2000   DILATION  AND EVACUATION     endocervical polyp removal  03/01/1999   ENDOMETRIAL ABLATION  02/29/2008   HERNIA REPAIR  05/16/2012   incisional hernia repair   HYSTEROSCOPY W/ ENDOMETRIAL ABLATION  01/28/2009   INSERTION OF MESH N/A 05/16/2012   Procedure: INSERTION OF MESH;  Surgeon: Joyice Faster. Cornett, MD;  Location: [facility];  Service: General;  Laterality: N/A;   KNEE ARTHROSCOPY Left 12/05/2012   Procedure: LEFT KNEE ARTHROSCOPY;  Surgeon: Kerin Salen, MD;  Location: [facility];  Service: Orthopedics;  Laterality: Left;   MELANOMA EXCISION  02/29/2000   Left Leg   melanoma removal   02/28/2001   REPLACEMENT TOTAL KNEE BILATERAL Bilateral    SALPINGOOPHORECTOMY  12/08/2011   Procedure: SALPINGO OOPHERECTOMY;  Surgeon: Eldred Manges, MD;  Location: [facility];  Service: Gynecology;  Laterality: Bilateral;   TENDON RELEASE  02/28/2002   uterine curettage  01/28/2009   VENTRAL HERNIA REPAIR N/A 05/16/2012   Procedure: LAPAROSCOPIC VENTRAL HERNIA;  Surgeon: Marcello Moores A. Cornett, MD;  Location: [facility];  Service: General;  Laterality: N/A;   [place] EXTRACTION  03/01/1987    Social History: Social History   Socioeconomic History   Marital status: Married    Spouse name: Psychiatric nurse   Number of children: 3   Years of education: Not on file   Highest education level: Not on file  Occupational History   Occupation: Armed forces operational officer  Tobacco Use   Smoking status: Former    Types: Cigarettes    Quit date: 04/26/2020    Years since quitting: 1.5   Smokeless tobacco: Never   Tobacco comments:    1-3 cigarrettes per week  Vaping Use   Vaping Use: Never used  Substance and Sexual Activity   Alcohol use: No   Drug use: No    Sexual activity: Yes    Birth control/protection: Surgical    Comment: BTL  Other Topics Concern   Not on file  Social History Narrative   ** Merged History Encounter **       Lives with husband and 3 children Owns a Animal nutritionist hospital Vegetarian Wears seat belt   Social Determinants of Health   Financial Resource Strain: Not on file  Food Insecurity: Not on file  Transportation Needs: Not on file  Physical Activity: Not on file  Stress: Not on file  Social Connections: Not on file  Intimate Partner Violence: Not on file    Family History: Family History  Problem Relation Age of Onset   Hypertension Mother    Heart disease Mother    Anxiety disorder Mother    Breast cancer Mother 23   Heart disease Father        atrial fib   Hypertension Father    Atrial fibrillation Father    Obesity Father    Bladder Cancer Father 64   Lymphoma Father 82   Diverticulitis Brother    Ovarian cancer Maternal Aunt 48   Breast cancer Maternal Grandmother 34   Heart disease Maternal Grandfather        MI   Heart disease Paternal Grandfather        MI   Leukemia Nephew 15       AML and ALL   Colon cancer Neg Hx    Colon polyps Neg Hx    Esophageal cancer Neg Hx    Rectal cancer Neg Hx    Stomach cancer Neg Hx     Review of Systems: Denies  fevers, chills or shortness of breath.  Denies any recent changes in her health.  Physical Exam: Vital Signs BP 121/75 (BP Location: Right Arm, Patient Position: Sitting, Cuff Size: Large)   Pulse 79   Temp 98.6 F (37 C) (Oral)   Resp 18   Ht '5\' 4"'$  (1.626 m)   Wt 219 lb (99.3 kg)   LMP 11/19/2010   SpO2 98%   BMI 37.59 kg/m   Physical Exam  Constitutional:      General: Not in acute distress.    Appearance: Normal appearance. Not ill-appearing.  HENT:     Head: Normocephalic and atraumatic.  Neck:     Musculoskeletal: Normal range of motion.  Cardiovascular:     Rate and Rhythm: Normal rate Pulmonary:     Effort:  Pulmonary effort is normal. No respiratory distress.  Musculoskeletal: Normal range of motion.  Skin:    General: Skin is warm and dry.     Findings: No erythema or rash.  Neurological:     Mental Status: Alert and oriented to person, place, and time. Mental status is at baseline.  Psychiatric:        Mood and Affect: Mood normal.        Behavior: Behavior normal.    Assessment/Plan: The patient is scheduled for bilateral breast reduction with Dr. Marla Roe.  Risks, benefits, and alternatives of procedure discussed, questions answered and consent obtained.    Smoking Status: Has not smoked in 1 year; Counseling Given?  Discussed with patient to continue to be tobacco free. Last Mammogram: January 2023; Results: Negative per patient  Caprini Score: 5; Risk Factors include: Age, hormone replacement, BMI greater than 25, and length of planned surgery. Recommendation for mechanical prophylaxis. Encourage early ambulation.   Pictures obtained: '@consult'$   Post-op Rx sent to pharmacy:  Oxycodone, Zofran, Keflex  -I confirm with the pharmacy that they have received all 3 of these medications.  I instructed the patient to hold her turmeric prior to surgery.  I discussed with the patient to hold her HCTZ and her lisinopril the morning of surgery.  I discussed with the patient to hold any over-the-counter supplements or vitamins that she may be taking.  Patient expressed understanding.  I discussed with the patient that she may be at a slightly increased risk of DVT given the use of her estradiol patch.  I told the patient I would discuss with Dr. Marla Roe regards to holding it and send a clearance to her GYN if we were to hold it.  Patient was provided with the breast reduction and General Surgical Risk consent document and Pain Medication Agreement prior to their appointment.  They had adequate time to read through the risk consent documents and Pain Medication Agreement. We also discussed them  in person together during this preop appointment. All of their questions were answered to their satisfaction.  Recommended calling if they have any further questions.  Risk consent form and Pain Medication Agreement to be scanned into patient's chart.  The risk that can be encountered with breast reduction were discussed and include the following but not limited to these:  Breast asymmetry, fluid accumulation, firmness of the breast, inability to breast feed, loss of nipple or areola, skin loss, decrease or no nipple sensation, fat necrosis of the breast tissue, bleeding, infection, healing delay.  There are risks of anesthesia, changes to skin sensation and injury to nerves or blood vessels.  The muscle can be temporarily or permanently injured.  You may  have an allergic reaction to tape, suture, glue, blood products which can result in skin discoloration, swelling, pain, skin lesions, poor healing.  Any of these can lead to the need for revisonal surgery or stage procedures.  A reduction has potential to interfere with diagnostic procedures.  Nipple or breast piercing can increase risks of infection.  This procedure is best done when the breast is fully developed.  Changes in the breast will continue to occur over time.  Pregnancy can alter the outcomes of previous breast reduction surgery, weight gain and weigh loss can also effect the long term appearance.   The risks that can be encountered with and after liposuction were discussed and include the following but no limited to these:  Asymmetry, fluid accumulation, firmness of the area, fat necrosis with death of fat tissue, bleeding, infection, delayed healing, anesthesia risks, skin sensation changes, injury to structures including nerves, blood vessels, and muscles which may be temporary or permanent, allergies to tape, suture materials and glues, blood products, topical preparations or injected agents, skin and contour irregularities, skin discoloration  and swelling, deep vein thrombosis, cardiac and pulmonary complications, pain, which may persist, persistent pain, recurrence of the lesion, poor healing of the incision, possible need for revisional surgery or staged procedures. Thiere can also be persistent swelling, poor wound healing, rippling or loose skin, worsening of cellulite, swelling, and thermal burn or heat injury from ultrasound with the ultrasound-assisted lipoplasty technique. Any change in weight fluctuations can alter the outcome.     Electronically signed by: Clance Boll, PA-C 11/17/2021 1:21 PM

## 2021-11-17 ENCOUNTER — Encounter: Payer: 59 | Admitting: Student

## 2021-11-17 ENCOUNTER — Telehealth: Payer: Self-pay

## 2021-11-17 NOTE — Telephone Encounter (Signed)
Faxed surgical clearance to Elder Negus, CNM to hold estradiol patch 1 week before and after surgery.

## 2021-11-18 ENCOUNTER — Encounter: Payer: Self-pay | Admitting: Student

## 2021-11-18 NOTE — Progress Notes (Signed)
Clearance to hold patient's estradiol patch has been received from patient's GYN provider, Donnel Saxon.   Medications to hold prior to surgery estradiol patch (Stop taking: 1 week prior to surgery. Begin retaking 1 week after surgery)

## 2021-11-18 NOTE — Progress Notes (Signed)
I called the patient to let her know we received clearance from her GYN provider to hold her estradiol patch 1 week prior to surgery and 1 week after. Patient states she is unsure if she wants to hold it as she does not know if she will have any symptoms without it as she has not been without it. Patient states she is going to reach out to her GYN provider to discuss.   I discussed with her that staying on her estradiol may increase her risk of developing a clot after surgery. Patient expressed understanding.

## 2021-11-24 ENCOUNTER — Other Ambulatory Visit: Payer: Self-pay

## 2021-11-24 ENCOUNTER — Ambulatory Visit (HOSPITAL_BASED_OUTPATIENT_CLINIC_OR_DEPARTMENT_OTHER)
Admission: RE | Admit: 2021-11-24 | Discharge: 2021-11-24 | Disposition: A | Discharge status: Home / Self Care | Payer: 59 | Attending: Plastic Surgery | Admitting: Plastic Surgery

## 2021-11-24 ENCOUNTER — Ambulatory Visit (HOSPITAL_BASED_OUTPATIENT_CLINIC_OR_DEPARTMENT_OTHER): Payer: 59 | Admitting: Certified Registered Nurse Anesthetist

## 2021-11-24 ENCOUNTER — Encounter (HOSPITAL_BASED_OUTPATIENT_CLINIC_OR_DEPARTMENT_OTHER): Payer: Self-pay | Admitting: Plastic Surgery

## 2021-11-24 ENCOUNTER — Encounter (HOSPITAL_BASED_OUTPATIENT_CLINIC_OR_DEPARTMENT_OTHER)
Admission: RE | Disposition: A | Discharge status: Home / Self Care | Payer: Self-pay | Source: Home / Self Care | Attending: Plastic Surgery

## 2021-11-24 DIAGNOSIS — K219 Gastro-esophageal reflux disease without esophagitis: Secondary | ICD-10-CM | POA: Insufficient documentation

## 2021-11-24 DIAGNOSIS — M25511 Pain in right shoulder: Secondary | ICD-10-CM | POA: Diagnosis not present

## 2021-11-24 DIAGNOSIS — Z90722 Acquired absence of ovaries, bilateral: Secondary | ICD-10-CM | POA: Diagnosis not present

## 2021-11-24 DIAGNOSIS — Z87891 Personal history of nicotine dependence: Secondary | ICD-10-CM | POA: Insufficient documentation

## 2021-11-24 DIAGNOSIS — N62 Hypertrophy of breast: Secondary | ICD-10-CM

## 2021-11-24 DIAGNOSIS — I10 Essential (primary) hypertension: Secondary | ICD-10-CM | POA: Insufficient documentation

## 2021-11-24 DIAGNOSIS — Z90710 Acquired absence of both cervix and uterus: Secondary | ICD-10-CM | POA: Insufficient documentation

## 2021-11-24 DIAGNOSIS — M542 Cervicalgia: Secondary | ICD-10-CM | POA: Diagnosis not present

## 2021-11-24 DIAGNOSIS — N6022 Fibroadenosis of left breast: Secondary | ICD-10-CM | POA: Diagnosis not present

## 2021-11-24 DIAGNOSIS — M549 Dorsalgia, unspecified: Secondary | ICD-10-CM | POA: Insufficient documentation

## 2021-11-24 DIAGNOSIS — N6021 Fibroadenosis of right breast: Secondary | ICD-10-CM | POA: Insufficient documentation

## 2021-11-24 DIAGNOSIS — M25512 Pain in left shoulder: Secondary | ICD-10-CM | POA: Insufficient documentation

## 2021-11-24 DIAGNOSIS — G8929 Other chronic pain: Secondary | ICD-10-CM | POA: Diagnosis not present

## 2021-11-24 DIAGNOSIS — Z803 Family history of malignant neoplasm of breast: Secondary | ICD-10-CM | POA: Insufficient documentation

## 2021-11-24 DIAGNOSIS — N6041 Mammary duct ectasia of right breast: Secondary | ICD-10-CM | POA: Insufficient documentation

## 2021-11-24 DIAGNOSIS — Z01818 Encounter for other preprocedural examination: Secondary | ICD-10-CM

## 2021-11-24 DIAGNOSIS — N6042 Mammary duct ectasia of left breast: Secondary | ICD-10-CM | POA: Diagnosis not present

## 2021-11-24 HISTORY — PX: BREAST REDUCTION SURGERY: SHX8

## 2021-11-24 SURGERY — BREAST REDUCTION WITH LIPOSUCTION
Anesthesia: General | Site: Breast | Laterality: Bilateral

## 2021-11-24 MED ORDER — ACETAMINOPHEN 500 MG PO TABS
ORAL_TABLET | ORAL | Status: AC
  Filled 2021-11-24: qty 2

## 2021-11-24 MED ORDER — DIPHENHYDRAMINE HCL 50 MG/ML IJ SOLN
INTRAMUSCULAR | Status: DC | PRN
  Administered 2021-11-24: 12.5 mg via INTRAVENOUS

## 2021-11-24 MED ORDER — HYDROMORPHONE HCL 1 MG/ML IJ SOLN
INTRAMUSCULAR | Status: AC
  Filled 2021-11-24: qty 1

## 2021-11-24 MED ORDER — LIDOCAINE HCL 1 % IJ SOLN
INTRAVENOUS | Status: DC | PRN
  Administered 2021-11-24: 600 mL

## 2021-11-24 MED ORDER — LIDOCAINE-EPINEPHRINE 1 %-1:100000 IJ SOLN: INTRAMUSCULAR | Status: DC | PRN

## 2021-11-24 MED ORDER — LIDOCAINE HCL (PF) 1 % IJ SOLN
INTRAMUSCULAR | Status: AC
  Filled 2021-11-24: qty 30

## 2021-11-24 MED ORDER — ACETAMINOPHEN 500 MG PO TABS
1000.0000 mg | ORAL_TABLET | Freq: Once | ORAL | Status: AC
  Administered 2021-11-24: 1000 mg via ORAL

## 2021-11-24 MED ORDER — ACETAMINOPHEN 325 MG RE SUPP: 650.0000 mg | RECTAL | Status: DC | PRN

## 2021-11-24 MED ORDER — LIDOCAINE-EPINEPHRINE 1 %-1:100000 IJ SOLN
INTRAMUSCULAR | Status: AC
  Filled 2021-11-24: qty 1

## 2021-11-24 MED ORDER — ROCURONIUM BROMIDE 100 MG/10ML IV SOLN
INTRAVENOUS | Status: DC | PRN
  Administered 2021-11-24: 60 mg via INTRAVENOUS
  Administered 2021-11-24: 30 mg via INTRAVENOUS

## 2021-11-24 MED ORDER — OXYCODONE HCL 5 MG PO TABS
5.0000 mg | ORAL_TABLET | ORAL | Status: DC | PRN
  Administered 2021-11-24: 5 mg via ORAL

## 2021-11-24 MED ORDER — SUGAMMADEX SODIUM 200 MG/2ML IV SOLN
INTRAVENOUS | Status: DC | PRN
  Administered 2021-11-24: 200 mg via INTRAVENOUS

## 2021-11-24 MED ORDER — SCOPOLAMINE 1 MG/3DAYS TD PT72
MEDICATED_PATCH | TRANSDERMAL | Status: AC
  Filled 2021-11-24: qty 1

## 2021-11-24 MED ORDER — ONDANSETRON HCL 4 MG/2ML IJ SOLN
INTRAMUSCULAR | Status: DC | PRN
  Administered 2021-11-24: 4 mg via INTRAVENOUS

## 2021-11-24 MED ORDER — DEXAMETHASONE SODIUM PHOSPHATE 10 MG/ML IJ SOLN
INTRAMUSCULAR | Status: AC
  Filled 2021-11-24: qty 1

## 2021-11-24 MED ORDER — HYDROMORPHONE HCL 1 MG/ML IJ SOLN
INTRAMUSCULAR | Status: DC | PRN
  Administered 2021-11-24: .5 mg via INTRAVENOUS

## 2021-11-24 MED ORDER — 0.9 % SODIUM CHLORIDE (POUR BTL) OPTIME
TOPICAL | Status: DC | PRN
  Administered 2021-11-24: 1000 mL

## 2021-11-24 MED ORDER — SODIUM CHLORIDE 0.9% FLUSH: 3.0000 mL | INTRAVENOUS | Status: DC | PRN

## 2021-11-24 MED ORDER — FENTANYL CITRATE (PF) 100 MCG/2ML IJ SOLN
INTRAMUSCULAR | Status: DC | PRN
  Administered 2021-11-24 (×2): 50 ug via INTRAVENOUS

## 2021-11-24 MED ORDER — DEXAMETHASONE SODIUM PHOSPHATE 4 MG/ML IJ SOLN
INTRAMUSCULAR | Status: DC | PRN
  Administered 2021-11-24: 8 mg via INTRAVENOUS

## 2021-11-24 MED ORDER — BUPIVACAINE HCL (PF) 0.25 % IJ SOLN
INTRAMUSCULAR | Status: AC
  Filled 2021-11-24: qty 30

## 2021-11-24 MED ORDER — FENTANYL CITRATE (PF) 100 MCG/2ML IJ SOLN
INTRAMUSCULAR | Status: AC
  Filled 2021-11-24: qty 2

## 2021-11-24 MED ORDER — ROCURONIUM BROMIDE 10 MG/ML (PF) SYRINGE
PREFILLED_SYRINGE | INTRAVENOUS | Status: AC
  Filled 2021-11-24: qty 10

## 2021-11-24 MED ORDER — LACTATED RINGERS IV SOLN: INTRAVENOUS | Status: DC

## 2021-11-24 MED ORDER — CHLORHEXIDINE GLUCONATE CLOTH 2 % EX PADS: 6.0000 | MEDICATED_PAD | Freq: Once | CUTANEOUS | Status: DC

## 2021-11-24 MED ORDER — SODIUM CHLORIDE 0.9% FLUSH: 3.0000 mL | Freq: Two times a day (BID) | INTRAVENOUS | Status: DC

## 2021-11-24 MED ORDER — OXYCODONE HCL 5 MG PO TABS
ORAL_TABLET | ORAL | Status: AC
  Filled 2021-11-24: qty 1

## 2021-11-24 MED ORDER — ONDANSETRON HCL 4 MG/2ML IJ SOLN
INTRAMUSCULAR | Status: AC
  Filled 2021-11-24: qty 2

## 2021-11-24 MED ORDER — LIDOCAINE-EPINEPHRINE 1 %-1:100000 IJ SOLN
INTRAMUSCULAR | Status: DC | PRN
  Administered 2021-11-24: 50 mL via INTRAMUSCULAR

## 2021-11-24 MED ORDER — PROPOFOL 10 MG/ML IV BOLUS
INTRAVENOUS | Status: AC
  Filled 2021-11-24: qty 20

## 2021-11-24 MED ORDER — LIDOCAINE 2% (20 MG/ML) 5 ML SYRINGE
INTRAMUSCULAR | Status: AC
  Filled 2021-11-24: qty 5

## 2021-11-24 MED ORDER — EPINEPHRINE PF 1 MG/ML IJ SOLN
INTRAMUSCULAR | Status: AC
  Filled 2021-11-24: qty 1

## 2021-11-24 MED ORDER — FENTANYL CITRATE (PF) 100 MCG/2ML IJ SOLN
25.0000 ug | INTRAMUSCULAR | Status: DC | PRN
  Administered 2021-11-24 (×2): 25 ug via INTRAVENOUS

## 2021-11-24 MED ORDER — CEFAZOLIN SODIUM-DEXTROSE 2-4 GM/100ML-% IV SOLN
2.0000 g | INTRAVENOUS | Status: AC
  Administered 2021-11-24: 2 g via INTRAVENOUS

## 2021-11-24 MED ORDER — PHENYLEPHRINE HCL (PRESSORS) 10 MG/ML IV SOLN
INTRAVENOUS | Status: DC | PRN
  Administered 2021-11-24 (×2): 80 ug via INTRAVENOUS
  Administered 2021-11-24: 160 ug via INTRAVENOUS
  Administered 2021-11-24 (×2): 80 ug via INTRAVENOUS
  Administered 2021-11-24: 160 ug via INTRAVENOUS
  Administered 2021-11-24 (×3): 80 ug via INTRAVENOUS

## 2021-11-24 MED ORDER — MIDAZOLAM HCL 2 MG/2ML IJ SOLN
INTRAMUSCULAR | Status: AC
  Filled 2021-11-24: qty 2

## 2021-11-24 MED ORDER — FENTANYL CITRATE (PF) 100 MCG/2ML IJ SOLN: 25.0000 ug | INTRAMUSCULAR | Status: DC | PRN

## 2021-11-24 MED ORDER — PROMETHAZINE HCL 25 MG/ML IJ SOLN: 6.2500 mg | INTRAMUSCULAR | Status: DC | PRN

## 2021-11-24 MED ORDER — BUPIVACAINE HCL (PF) 0.25 % IJ SOLN: INTRAMUSCULAR | Status: DC | PRN

## 2021-11-24 MED ORDER — MIDAZOLAM HCL 5 MG/5ML IJ SOLN
INTRAMUSCULAR | Status: DC | PRN
  Administered 2021-11-24: 2 mg via INTRAVENOUS

## 2021-11-24 MED ORDER — SODIUM CHLORIDE 0.9 % IV SOLN: 250.0000 mL | INTRAVENOUS | Status: DC | PRN

## 2021-11-24 MED ORDER — SCOPOLAMINE 1 MG/3DAYS TD PT72
1.0000 | MEDICATED_PATCH | Freq: Once | TRANSDERMAL | Status: DC
  Administered 2021-11-24: 1.5 mg via TRANSDERMAL

## 2021-11-24 MED ORDER — LIDOCAINE HCL (CARDIAC) PF 100 MG/5ML IV SOSY
PREFILLED_SYRINGE | INTRAVENOUS | Status: DC | PRN
  Administered 2021-11-24: 70 mg via INTRAVENOUS

## 2021-11-24 MED ORDER — PROPOFOL 10 MG/ML IV BOLUS
INTRAVENOUS | Status: DC | PRN
  Administered 2021-11-24: 160 mg via INTRAVENOUS

## 2021-11-24 MED ORDER — ACETAMINOPHEN 325 MG PO TABS: 650.0000 mg | ORAL_TABLET | ORAL | Status: DC | PRN

## 2021-11-24 MED ORDER — CEFAZOLIN SODIUM-DEXTROSE 2-4 GM/100ML-% IV SOLN
INTRAVENOUS | Status: AC
  Filled 2021-11-24: qty 100

## 2021-11-24 SURGICAL SUPPLY — 63 items
ADH SKN CLS APL DERMABOND .7 (GAUZE/BANDAGES/DRESSINGS) ×2
BAG DECANTER FOR FLEXI CONT (MISCELLANEOUS) ×1 IMPLANT
BINDER BREAST LRG (GAUZE/BANDAGES/DRESSINGS) IMPLANT
BINDER BREAST MEDIUM (GAUZE/BANDAGES/DRESSINGS) IMPLANT
BINDER BREAST XLRG (GAUZE/BANDAGES/DRESSINGS) IMPLANT
BINDER BREAST XXLRG (GAUZE/BANDAGES/DRESSINGS) IMPLANT
BIOPATCH RED 1 DISK 7.0 (GAUZE/BANDAGES/DRESSINGS) IMPLANT
BLADE HEX COATED 2.75 (ELECTRODE) ×1 IMPLANT
BLADE KNIFE PERSONA 10 (BLADE) ×2 IMPLANT
BLADE SURG 15 STRL LF DISP TIS (BLADE) IMPLANT
BLADE SURG 15 STRL SS (BLADE) ×1
CANISTER SUCT 1200ML W/VALVE (MISCELLANEOUS) ×1 IMPLANT
COVER BACK TABLE 60X90IN (DRAPES) ×1 IMPLANT
COVER MAYO STAND STRL (DRAPES) ×1 IMPLANT
DERMABOND ADVANCED .7 DNX12 (GAUZE/BANDAGES/DRESSINGS) ×2 IMPLANT
DRAIN CHANNEL 19F RND (DRAIN) IMPLANT
DRAPE LAPAROSCOPIC ABDOMINAL (DRAPES) ×1 IMPLANT
DRSG OPSITE POSTOP 4X12 (GAUZE/BANDAGES/DRESSINGS) IMPLANT
DRSG OPSITE POSTOP 4X6 (GAUZE/BANDAGES/DRESSINGS) ×1 IMPLANT
ELECT BLADE 4.0 EZ CLEAN MEGAD (MISCELLANEOUS) ×2
ELECT REM PT RETURN 9FT ADLT (ELECTROSURGICAL) ×1
ELECTRODE BLDE 4.0 EZ CLN MEGD (MISCELLANEOUS) IMPLANT
ELECTRODE REM PT RTRN 9FT ADLT (ELECTROSURGICAL) ×1 IMPLANT
EVACUATOR SILICONE 100CC (DRAIN) IMPLANT
GAUZE PAD ABD 8X10 STRL (GAUZE/BANDAGES/DRESSINGS) ×2 IMPLANT
GLOVE BIO SURGEON STRL SZ 6.5 (GLOVE) ×3 IMPLANT
GLOVE BIO SURGEON STRL SZ7.5 (GLOVE) ×1 IMPLANT
GOWN STRL REUS W/ TWL LRG LVL3 (GOWN DISPOSABLE) ×2 IMPLANT
GOWN STRL REUS W/ TWL XL LVL3 (GOWN DISPOSABLE) ×1 IMPLANT
GOWN STRL REUS W/TWL LRG LVL3 (GOWN DISPOSABLE) ×2
GOWN STRL REUS W/TWL XL LVL3 (GOWN DISPOSABLE) ×1
NDL FILTER BLUNT 18X1 1/2 (NEEDLE) IMPLANT
NDL HYPO 25X1 1.5 SAFETY (NEEDLE) ×1 IMPLANT
NEEDLE FILTER BLUNT 18X1 1/2 (NEEDLE) IMPLANT
NEEDLE HYPO 25X1 1.5 SAFETY (NEEDLE) ×1 IMPLANT
NS IRRIG 1000ML POUR BTL (IV SOLUTION) ×1 IMPLANT
PACK BASIN DAY SURGERY FS (CUSTOM PROCEDURE TRAY) ×1 IMPLANT
PAD ALCOHOL SWAB (MISCELLANEOUS) IMPLANT
PAD FOAM SILICONE BACKED (GAUZE/BANDAGES/DRESSINGS) IMPLANT
PENCIL SMOKE EVACUATOR (MISCELLANEOUS) ×1 IMPLANT
PIN SAFETY STERILE (MISCELLANEOUS) IMPLANT
SLEEVE SCD COMPRESS KNEE MED (STOCKING) ×1 IMPLANT
SPIKE FLUID TRANSFER (MISCELLANEOUS) IMPLANT
SPONGE T-LAP 18X18 ~~LOC~~+RFID (SPONGE) ×2 IMPLANT
STRIP SUTURE WOUND CLOSURE 1/2 (MISCELLANEOUS) ×2 IMPLANT
SUT MNCRL AB 4-0 PS2 18 (SUTURE) ×4 IMPLANT
SUT MON AB 3-0 SH 27 (SUTURE) ×8
SUT MON AB 3-0 SH27 (SUTURE) ×4 IMPLANT
SUT MON AB 5-0 PS2 18 (SUTURE) IMPLANT
SUT PDS 3-0 CT2 (SUTURE) ×5
SUT PDS II 3-0 CT2 27 ABS (SUTURE) ×4 IMPLANT
SUT SILK 3 0 PS 1 (SUTURE) IMPLANT
SYR 50ML LL SCALE MARK (SYRINGE) IMPLANT
SYR BULB IRRIG 60ML STRL (SYRINGE) ×1 IMPLANT
SYR CONTROL 10ML LL (SYRINGE) ×1 IMPLANT
TAPE MEASURE VINYL STERILE (MISCELLANEOUS) IMPLANT
TOWEL GREEN STERILE FF (TOWEL DISPOSABLE) ×2 IMPLANT
TRAY DSU PREP LF (CUSTOM PROCEDURE TRAY) ×1 IMPLANT
TUBE CONNECTING 20X1/4 (TUBING) ×1 IMPLANT
TUBING INFILTRATION IT-10001 (TUBING) IMPLANT
TUBING SET GRADUATE ASPIR 12FT (MISCELLANEOUS) IMPLANT
UNDERPAD 30X36 HEAVY ABSORB (UNDERPADS AND DIAPERS) ×2 IMPLANT
YANKAUER SUCT BULB TIP NO VENT (SUCTIONS) ×1 IMPLANT

## 2021-11-24 NOTE — Interval H&P Note (Signed)
History and Physical Interval Note:  11/24/2021 8:08 AM  [name]  has presented today for surgery, with the diagnosis of Hypertrophy of breast.  The various methods of treatment have been discussed with the patient and family. After consideration of risks, benefits and other options for treatment, the patient has consented to  Procedure(s): BREAST REDUCTION WITH LIPOSUCTION (Bilateral) as a surgical intervention.  The patient's history has been reviewed, patient examined, no change in status, stable for surgery.  I have reviewed the patient's chart and labs.  Questions were answered to the patient's satisfaction.     Loel Lofty Darneshia Demary

## 2021-11-24 NOTE — Anesthesia Procedure Notes (Signed)
Procedure Name: Intubation Date/Time: 11/24/2021 8:30 AM  Performed by: Bufford Spikes, CRNAPre-anesthesia Checklist: Patient identified, Emergency Drugs available, Suction available and Patient being monitored Patient Re-evaluated:Patient Re-evaluated prior to induction Oxygen Delivery Method: Circle system utilized Preoxygenation: Pre-oxygenation with 100% oxygen Induction Type: IV induction Ventilation: Mask ventilation without difficulty Laryngoscope Size: Miller and 2 Tube type: Oral Tube size: 7.0 mm Number of attempts: 1 Airway Equipment and Method: Stylet and Oral airway Placement Confirmation: ETT inserted through vocal cords under direct vision, positive ETCO2 and breath sounds checked- equal and bilateral Secured at: 21 cm Tube secured with: Tape Dental Injury: Teeth and Oropharynx as per pre-operative assessment

## 2021-11-24 NOTE — Anesthesia Preprocedure Evaluation (Signed)
Anesthesia Evaluation  Patient identified by MRN, date of birth, ID band Patient awake    Reviewed: Allergy & Precautions, NPO status , Patient's Chart, lab work & pertinent test results  History of Anesthesia Complications (+) PROLONGED EMERGENCE and history of anesthetic complications  Airway Mallampati: II  TM Distance: >3 FB Neck ROM: Full    Dental  (+) Teeth Intact, Dental Advisory Given   Pulmonary sleep apnea , former smoker,    Pulmonary exam normal breath sounds clear to auscultation       Cardiovascular hypertension, Pt. on medications Normal cardiovascular exam Rhythm:Regular Rate:Normal     Neuro/Psych  Headaches,    GI/Hepatic Neg liver ROS, GERD  ,  Endo/Other  negative endocrine ROSObesity   Renal/GU negative Renal ROS     Musculoskeletal negative musculoskeletal ROS (+)   Abdominal   Peds  Hematology negative hematology ROS (+)   Anesthesia Other Findings Day of surgery medications reviewed with the patient.  Reproductive/Obstetrics                             Anesthesia Physical Anesthesia Plan  ASA: 2  Anesthesia Plan: General   Post-op Pain Management: Tylenol PO (pre-op)*   Induction: Intravenous  PONV Risk Score and Plan: 3 and Midazolam, Scopolamine patch - Pre-op, Dexamethasone and Ondansetron  Airway Management Planned: Oral ETT  Additional Equipment:   Intra-op Plan:   Post-operative Plan: Extubation in OR  Informed Consent: I have reviewed the patients History and Physical, chart, labs and discussed the procedure including the risks, benefits and alternatives for the proposed anesthesia with the patient or authorized representative who has indicated his/her understanding and acceptance.     Dental advisory given  Plan Discussed with: CRNA  Anesthesia Plan Comments:         Anesthesia Quick Evaluation

## 2021-11-24 NOTE — Discharge Instructions (Addendum)
INSTRUCTIONS FOR AFTER BREAST SURGERY   You will likely have some questions about what to expect following your operation.  The following information will help you and your family understand what to expect when you are discharged from the hospital.  Following these guidelines will help ensure a smooth recovery and reduce risks of complications.  Postoperative instructions include information on: diet, wound care, medications and physical activity.  AFTER SURGERY Expect to go home after the procedure.  In some cases, you may need to spend one night in the hospital for observation.  DIET Breast surgery does not require a specific diet.  However, the healthier you eat the better your body can start healing. It is important to increasing your protein intake.  This means limiting the foods with sugar and carbohydrates.  Focus on vegetables and some meat.  If you have any liposuction during your procedure be sure to drink water.  If your urine is bright yellow, then it is concentrated, and you need to drink more water.  As a general rule after surgery, you should have 8 ounces of water every hour while awake.  If you find you are persistently nauseated or unable to take in liquids let us know.  NO TOBACCO USE or EXPOSURE.  This will slow your healing process and increase the risk of a wound.  WOUND CARE Leave the binder on for 3 days . Use fragrance free soap.   After 3 days you can remove the binder to shower. Once dry apply binder or sports bra.  Use a mild soap like Dial, Dove and [place]. You may have Topifoam or Lipofoam on.  It is soft and spongy and helps keep you from getting creases if you have liposuction.  This can be removed before the shower and then replaced.  If you need more it is available on [employer] (Lipofoam). If you have steri-strips / tape directly attached to your skin leave them in place. It is OK to get these wet.   No baths, pools or hot tubs for four weeks. We close your incision to  leave the smallest and best-looking scar. No ointment or creams on your incisions until given the go ahead.  Especially not Neosporin (Too many skin reactions with this one).  A few weeks after surgery you can use Mederma and start massaging the scar. We ask you to wear your binder or sports bra for the first 6 weeks around the clock, including while sleeping. This provides added comfort and helps reduce the fluid accumulation at the surgery site.  ACTIVITY No heavy lifting until cleared by the doctor.  This usually means no more than a half-gallon of milk.  It is OK to walk and climb stairs. In fact, moving your legs is very important to decrease your risk of a blood clot.  It will also help keep you from getting deconditioned.  Every 1 to 2 hours get up and walk for 5 minutes. This will help with a quicker recovery back to normal.  Let pain be your guide so you don't do too much.  This is not the time for spring cleaning and don't plan on taking care of anyone else.  This time is for you to recover,  You will be more comfortable if you sleep and rest with your head elevated either with a few pillows under you or in a recliner.  No stomach sleeping for a three months.  WORK Everyone returns to work at different times. As a  rough guide, most people take at least 1 - 2 weeks off prior to returning to work. If you need documentation for your job, bring the forms to your postoperative follow up visit.  DRIVING Arrange for someone to bring you home from the hospital.  You may be able to drive a few days after surgery but not while taking any narcotics or valium.  BOWEL MOVEMENTS Constipation can occur after anesthesia and while taking pain medication.  It is important to stay ahead for your comfort.  We recommend taking Milk of Magnesia (2 tablespoons; twice a day) while taking the pain pills.  MEDICATIONS You may be prescribed should start after surgery At your preoperative visit for you history and  physical you may have been given the following medications: An antibiotic: Start this medication when you get home and take according to the instructions on the bottle. Zofran 4 mg:  This is to treat nausea and vomiting.  You can take this every 6 hours as needed and only if needed. Valium 2 mg: This is for muscle tightness if you have an implant or expander. This will help relax your muscle which also helps with pain control.  This can be taken every 12 hours as needed. Don't drive after taking this medication. Norco (hydrocodone/acetaminophen) 5/325 mg:  This is only to be used after you have taken the motrin or the tylenol. Every 8 hours as needed.   Over the counter Medication to take: Ibuprofen (Motrin) 600 mg:  Take this every 6 hours.  If you have additional pain then take 500 mg of the tylenol every 8 hours.  Only take the Norco after you have tried these two. Miralax or stool softener of choice: Take this according to the bottle if you take the [place] Call your surgeon's office if any of the following occur: Fever 101 degrees F or greater Excessive bleeding or fluid from the incision site. Pain that increases over time without aid from the medications Redness, warmth, or pus draining from incision sites Persistent nausea or inability to take in liquids Severe misshapen area that underwent the operation.  Left message stating courtesy call and if any questions or concerns please call the doctors office.    Post Anesthesia Home Care Instructions  Activity: Get plenty of rest for the remainder of the day. A responsible individual must stay with you for 24 hours following the procedure.  For the next 24 hours, DO NOT: -Drive a car -Paediatric nurse -Drink alcoholic beverages -Take any medication unless instructed by your physician -Make any legal decisions or sign important papers.  Meals: Start with liquid foods such as gelatin or soup. Progress to regular  foods as tolerated. Avoid greasy, spicy, heavy foods. If nausea and/or vomiting occur, drink only clear liquids until the nausea and/or vomiting subsides. Call your physician if vomiting continues.  Special Instructions/Symptoms: Your throat may feel dry or sore from the anesthesia or the breathing tube placed in your throat during surgery. If this causes discomfort, gargle with warm salt water. The discomfort should disappear within 24 hours.  If you had a scopolamine patch placed behind your ear for the management of post- operative nausea and/or vomiting:  1. The medication in the patch is effective for 72 hours, after which it should be removed.  Wrap patch in a tissue and discard in the trash. Wash hands thoroughly with soap and water. 2. You may remove the patch earlier than 72 hours if you  experience unpleasant side effects which may include dry mouth, dizziness or visual disturbances. 3. Avoid touching the patch. Wash your hands with soap and water after contact with the patch.

## 2021-11-24 NOTE — Op Note (Signed)
Breast Reduction Op note:    DATE OF PROCEDURE: 11/24/2021  LOCATION: [place]  SURGEON: [facility] Hortence Charter, DO  ASSISTANT: Donnamarie Rossetti, PA  PREOPERATIVE DIAGNOSIS 1. Macromastia 2. Neck Pain 3. Back Pain  POSTOPERATIVE DIAGNOSIS 1. Macromastia 2. Neck Pain 3. Back Pain  PROCEDURES 1. Bilateral breast reduction.  Right reduction 1417 g, Left reduction 7408 g  COMPLICATIONS: None.  DRAINS: Bilateral drains  INDICATIONS FOR PROCEDURE [name] is a 51 y.o. year-old female born on 1970/05/01,with a history of symptomatic macromastia with concominant back pain, neck pain, shoulder grooving from her bra.   MRN: [id number]  CONSENT Informed consent was obtained directly from the patient. The risks, benefits and alternatives were fully discussed. Specific risks including but not limited to bleeding, infection, hematoma, seroma, scarring, pain, nipple necrosis, asymmetry, poor cosmetic results, and need for further surgery were discussed. The patient had ample opportunity to have her questions answered to her satisfaction.  DESCRIPTION OF PROCEDURE  Patient was brought into the operating room and placed in a supine position.  SCDs were placed and appropriate padding was performed.  Antibiotics were given. The patient underwent general anesthesia and the chest was prepped and draped in a sterile fashion.  A timeout was performed and all information was confirmed to be correct.  Right side: Preoperative markings were confirmed.  Incision lines were injected with local with epinephrine.  After waiting for vasoconstriction, the marked lines were incised.  A Wise-pattern superomedial breast reduction was performed by de-epithelializing the pedicle, using bovie to create the superomedial pedicle, and removing breast tissue from the superior, lateral, and inferior portions of the breast.  Care was taken to not undermine the breast pedicle. Hemostasis  was achieved.  The nipple was gently rotated into position and the soft tissue closed with 4-0 Monocryl.   The pocket was irrigated and hemostasis confirmed.  The deep tissues were approximated with 3-0 PDS and 3-0 Monocryl sutures and the skin was closed with deep dermal and subcuticular 4-0 Monocryl sutures.  The nipple and skin flaps had good capillary refill at the end of the procedure.    Left side: Preoperative markings were confirmed.  Incision lines were injected with local with epinephrine.  After waiting for vasoconstriction, the marked lines were incised.  A Wise-pattern superomedial breast reduction was performed by de-epithelializing the pedicle, using bovie to create the superomedial pedicle, and removing breast tissue from the superior, lateral, and inferior portions of the breast.  Care was taken to not undermine the breast pedicle. Hemostasis was achieved.  The nipple was gently rotated into position and the soft tissue was closed with 4-0 Monocryl.  The patient was sat upright and size and shape symmetry was confirmed.  The pocket was irrigated and hemostasis confirmed.  The deep tissues were approximated with 3-0 PDS and 3-0 Monocryl sutures and the skin was closed with deep dermal and subcuticular 4-0 Monocryl sutures.  Dermabond was applied.  A breast binder and ABDs were placed.  The nipple and skin flaps had good capillary refill at the end of the procedure.  The patient tolerated the procedure well. The patient was allowed to wake from anesthesia and taken to the recovery room in satisfactory condition.  The advanced practice practitioner (APP) assisted throughout the case.  The APP was essential in retraction and counter traction when needed to make the case progress smoothly.  This retraction and assistance made it possible to see the tissue plans for the procedure.  The assistance was needed for blood control, tissue re-approximation and assisted with closure of the incision site.

## 2021-11-24 NOTE — Anesthesia Postprocedure Evaluation (Signed)
Anesthesia Post Note  Patient: [name]  Procedure(s) Performed: BREAST REDUCTION WITH LIPOSUCTION (Bilateral: Breast)     Patient location during evaluation: PACU Anesthesia Type: General Level of consciousness: awake and alert Pain management: pain level controlled Vital Signs Assessment: post-procedure vital signs reviewed and stable Respiratory status: spontaneous breathing, nonlabored ventilation, respiratory function stable and patient connected to nasal cannula oxygen Cardiovascular status: blood pressure returned to baseline and stable Postop Assessment: no apparent nausea or vomiting Anesthetic complications: no   No notable events documented.  Last Vitals:  Vitals:   11/24/21 1200 11/24/21 1300  BP: 103/70 (!) 103/59  Pulse: 80 73  Resp: 20 14  Temp:  36.7 C  SpO2: 93% 93%    Last Pain:  Vitals:   11/24/21 1242  TempSrc:   PainSc: [place]

## 2021-11-24 NOTE — Transfer of Care (Signed)
Immediate Anesthesia Transfer of Care Note  Patient: [name]  Procedure(s) Performed: BREAST REDUCTION WITH LIPOSUCTION (Bilateral: Breast)  Patient Location: PACU  Anesthesia Type:General  Level of Consciousness: awake, alert  and oriented  Airway & Oxygen Therapy: Patient Spontanous Breathing and Patient connected to face mask oxygen  Post-op Assessment: Report given to RN and Post -op Vital signs reviewed and stable  Post vital signs: Reviewed and stable  Last Vitals:  Vitals Value Taken Time  BP    Temp    Pulse 83 11/24/21 1111  Resp 14 11/24/21 1111  SpO2 90 % 11/24/21 1111  Vitals shown include unvalidated device data.  Last Pain:  Vitals:   11/24/21 0646  TempSrc: Oral  PainSc: 0-No pain         Complications: No notable events documented.

## 2021-11-25 ENCOUNTER — Other Ambulatory Visit: Payer: Self-pay | Admitting: Family Medicine

## 2021-11-25 ENCOUNTER — Telehealth: Payer: Self-pay | Admitting: Student

## 2021-11-25 ENCOUNTER — Encounter (HOSPITAL_BASED_OUTPATIENT_CLINIC_OR_DEPARTMENT_OTHER): Payer: Self-pay | Admitting: Plastic Surgery

## 2021-11-25 ENCOUNTER — Encounter: Payer: Self-pay | Admitting: Family Medicine

## 2021-11-25 ENCOUNTER — Encounter: Payer: Self-pay | Admitting: Plastic Surgery

## 2021-11-25 NOTE — Telephone Encounter (Signed)
I called the patient back regarding her MyChart message.  Patient states that she feels like her face is a little flushed.  She states she feels okay, and denies any chills, nausea or vomiting.  She denies any shortness of breath or difficulty breathing.  I discussed that her symptoms may be from her holding her estrogen patch, but I discussed with her that she should continue to monitor her temperature and her symptoms.  She states that she and Dr. Marla Roe discussed prior to surgery yesterday that she could restart her estrogen patch as long as she made sure she was ambulating frequently.    I discussed with her that if she feels that her symptoms worsen, her temperature is higher or if she has any other concerns to call us back. I encouraged the patient to make sure that she is drinking plenty of water and that she is walking and moving around.  Patient expressed understanding.   Patient reports that about 90 cc of drainage came out of her drains yesterday.  I told her to continue to monitor her drain output, and if the output drastically increases or if she has any concerns she can let us know.  Patient expressed understanding.  All of the other patient's questions were answered to her satisfaction.  I instructed the patient to call if she has any other questions or concerns.

## 2021-11-26 LAB — SURGICAL PATHOLOGY

## 2021-12-02 ENCOUNTER — Ambulatory Visit (INDEPENDENT_AMBULATORY_CARE_PROVIDER_SITE_OTHER): Payer: 59 | Admitting: Physician Assistant

## 2021-12-02 DIAGNOSIS — Z9889 Other specified postprocedural states: Secondary | ICD-10-CM

## 2021-12-02 NOTE — Progress Notes (Signed)
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 Marla Roe presents to clinic for postoperative follow-up.  According to preoperative reports, she was an L cup and was hoping to be a C cup.  She understood limitations given how small we can go in terms of preserving shape and nipple viability.  Reviewed operative report and 1417 g was removed from the right side, 1484 g was removed from the left side.  Today, patient is doing well.  She endorses considerable discomfort at drain tube insertion sites, R > L.  Her volume output has decreased considerably, less than 30 cc/day from each side x3 days.  She also reports that it feels as though water is getting into her honeycomb dressings after showering.  She denies any leg swelling, fevers, asymmetric swelling or redness, chest pain, or difficulty breathing.  Physical exam is entirely reassuring.  Breasts with excellent shape and symmetry.  No obvious seroma or hematoma.  Breasts are soft.  Honeycomb dressings removed without complication or difficulty, underlying Steri-Strips remain firmly intact.  No appreciable drainage or surrounding skin changes.  TopiFoam discarded, replaced with ABD pads.  Recommend continued ABD pads in her compressive bra to help with her discomfort.  Continued activity modifications.  Return in 2 weeks for likely Steri-Strip removal if they had not yet spontaneously fallen off and for reassessment.  We will obtain postoperative photos at that time.

## 2021-12-03 ENCOUNTER — Encounter: Payer: 59 | Admitting: Physician Assistant

## 2021-12-08 ENCOUNTER — Ambulatory Visit (INDEPENDENT_AMBULATORY_CARE_PROVIDER_SITE_OTHER): Payer: 59 | Admitting: Physician Assistant

## 2021-12-08 DIAGNOSIS — Z9889 Other specified postprocedural states: Secondary | ICD-10-CM

## 2021-12-08 NOTE — Progress Notes (Signed)
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 Marla Roe presents to clinic for postoperative follow-up.   She was last seen here in clinic on 12/02/2021.  At that time, her exam was entirely reassuring.  Breasts with excellent shape and symmetry and there was no obvious seroma or hematoma.  Breasts are soft.  Dressings and JP drains were removed.  Plan is for her to return in 2 weeks for follow-up and Steri-Strip removal.  Today, patient is concerned because she feels as though she has more swelling on the right side.  She wanted to come in for evaluation just to make sure everything was okay.  She denies any fevers, severe pain, or other systemic symptoms.  She had experienced a little bit of bleeding/drainage from inferior aspect of right breast prior to last encounter and now given the sensation of swelling, wanted to ensure that everything looked okay.  Physical exam is reassuring.  Breasts with good shape and symmetry.  Right side is appear to be very slightly larger than the left, but no significant firmness, fluid wave, or large ecchymoses.  NAC's are viable.  Steri-Strips appeared to be damp with underlying irritation, they were removed here at bedside without complication or difficulty.  0.5 x 3 x 0.25 cm wound at right inframammary incision, just lateral of inferior T zone.  2 cm area of step-off at vertical limb right breast.  Left breast has a 1 x 1 x 0.25 cm wound left inferior T zone.  1 x 0.5 cm wound at left vertical limb.   After discussion with patient, decided to attempt aspiration of right breast given patient's discomfort and concern for possible seroma.  Did not yield any volume.  Lower suspicion for seroma at this time.  Continue compressive garments and activity modifications.  Advised Vaseline and gauze to her small wounds.  The Steri-Strips did cause irritation of the skin, encouraged her  to keep her inframammary folds dry given that the moisture contributing to superficial skin breakdown.  No evidence concerning for infection at this time.  Plan for her to return next week.  She will call the clinic should she have any new or worsening symptoms in the interim.  Picture(s) obtained of the patient and placed in the chart were with the patient's or guardian's permission.

## 2021-12-10 ENCOUNTER — Other Ambulatory Visit (INDEPENDENT_AMBULATORY_CARE_PROVIDER_SITE_OTHER): Payer: 59 | Admitting: Physician Assistant

## 2021-12-10 DIAGNOSIS — Z9889 Other specified postprocedural states: Secondary | ICD-10-CM

## 2021-12-10 MED ORDER — DOXYCYCLINE HYCLATE 100 MG PO TABS: 100.0000 mg | ORAL_TABLET | Freq: Two times a day (BID) | ORAL | 0 refills | Status: AC

## 2021-12-10 NOTE — Progress Notes (Signed)
Patient submitted photos of incisional wound at inferior T zone, right breast.  Suspect slough rather than appearance suggestive of bacterial infection.  However, given that we are headed into the weekend, will prescribe doxycycline x7 days.  She will hold off on filling the prescription for now, but will begin taking if she feels as though the wound is getting worse or malodorous.  She denies any fevers or other systemic symptoms.  She actually feels as though her pain symptoms have begun to improve since the Steri-Strips were removed in clinic.  Follow-up is for next week, but she will call clinic should she have any new or worsening symptoms in interim.

## 2021-12-13 ENCOUNTER — Ambulatory Visit (INDEPENDENT_AMBULATORY_CARE_PROVIDER_SITE_OTHER): Payer: 59 | Admitting: Physician Assistant

## 2021-12-13 ENCOUNTER — Encounter: Payer: Self-pay | Admitting: Genetic Counselor

## 2021-12-13 ENCOUNTER — Other Ambulatory Visit: Payer: Self-pay | Admitting: Physician Assistant

## 2021-12-13 ENCOUNTER — Other Ambulatory Visit (HOSPITAL_COMMUNITY)
Admission: RE | Admit: 2021-12-13 | Discharge: 2021-12-13 | Disposition: A | Discharge status: Home / Self Care | Payer: 59 | Attending: Physician Assistant | Admitting: Physician Assistant

## 2021-12-13 ENCOUNTER — Encounter: Payer: Self-pay | Admitting: Physician Assistant

## 2021-12-13 VITALS — BP 115/78 | HR 94 | Temp 98.6°F

## 2021-12-13 DIAGNOSIS — Z9889 Other specified postprocedural states: Secondary | ICD-10-CM

## 2021-12-13 NOTE — Progress Notes (Signed)
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 Marla Roe presents to clinic for postoperative follow-up.  She was last seen here in clinic on 12/08/2021.  At that time, she felt as though there was more swelling on the right side than the left and was concern for infection.  Exam was reassuring.  3 x 0.5 x 0.25 cm wound at right inframammary incision was noted as well as a slightly smaller wound at left inferior T zone.  Small step-off wounds over vertical limbs were also appreciated.  Attempted aspiration of right breast, but unsuccessful.  Lower suspicion for seroma.  No cellulitis or evidence of infection was noted on exam.  She then however called in a couple of days later with additional concerns for infection and was prescribed doxycycline.  Today, patient reports that she has been experiencing fevers 100 to 101 F throughout the weekend.  She has been taking Tylenol for fever control.  She stopped taking ibuprofen because she has been experiencing upper abdominal/chest discomfort that she feels as stomach irritation from the NSAIDs.  Patient reports that it improves with Carafate/abortive treatments.  She denies any constant chest pain with radiating symptoms, nausea, or shortness of breath.  She also continues to endorse a headache which she states has been present for approximately 7 to 10 days.  Denies any blurred vision, numbness, weakness, or other neurodeficits.  She states that she has a history of headaches, but no history of migraines.  The oxycodone helps at night.  No leg swelling, cough, or difficulty breathing.  She has been applying gauze to her inframammary incisions bilaterally, replacing the gauze frequently on the right side due to drainage.  She states that there has been purulent drainage and that her right breast swelling has worsened and become red since last encounter.  She started taking  the doxycycline 2 days ago and states that this is the first day she is felt mildly improved and afebrile.  Vital signs stable and all WNL.  On physical exam, right breast with considerable erythema and induration inferiorly.  No fluctuance or crepitus.  Mild amount of purulent drainage expressed from inferior T zone wound.  Incisions appear irritated on the lateral aspects of bilateral inframammary incisions.  NAC's are viable and healing well.  Right breast is tender to palpation.  Patient with cellulitis to right breast.  Doubt abscess amenable to aspiration or drainage.  No fluctuance on exam.  Small amount of purulent drainage expressed manually through inferior T zone.  Obtained culture and will send.  In the interim, continue with doxycycline as she has only been on it for couple of days and reports that this is the first day that she has seen any form of improvement.  As for her chest/upper abdominal pain, discussed PPIs and cessation of NSAIDs, but if it changes in character or worsens she needs to go to the ER for evaluation.  She denies any melena or hematemesis.  Reports that it is intermittent, improved with antacids.  As for her headache, she denies any neurodeficits but again understands limitations of our exam and will go to the ER should it worsen.   Reasonable to continue with oral antibiotics outpatient to see if she experiences continued improvement.  However, if her fevers are uncontrolled with antipyretics or if her swelling/redness worsens and she continues to feel poorly, she may require IV antibiotic treatment. Culture obtained and orders placed with [facility].  She will call clinic should she have any new or worsening symptoms, otherwise she can return to clinic later this week for reevaluation.  Picture(s) obtained of the patient and placed in the chart were with the patient's or guardian's permission.

## 2021-12-14 ENCOUNTER — Encounter: Payer: Self-pay | Admitting: Family Medicine

## 2021-12-14 ENCOUNTER — Ambulatory Visit: Payer: 59 | Admitting: Family Medicine

## 2021-12-14 VITALS — BP 128/82 | HR 85 | Temp 98.4°F | Ht 64.0 in | Wt 222.4 lb

## 2021-12-14 DIAGNOSIS — R519 Headache, unspecified: Secondary | ICD-10-CM | POA: Diagnosis not present

## 2021-12-14 MED ORDER — KETOROLAC TROMETHAMINE 30 MG/ML IJ SOLN
30.0000 mg | Freq: Once | INTRAMUSCULAR | Status: AC
  Administered 2021-12-14: 30 mg via INTRAMUSCULAR

## 2021-12-14 MED ORDER — MELOXICAM 15 MG PO TABS: 15.0000 mg | ORAL_TABLET | Freq: Every day | ORAL | 0 refills | Status: AC

## 2021-12-14 NOTE — Patient Instructions (Signed)
Send me a message in 2 days if not turning the corner.   Ice/cold pack over area for 10-15 min twice daily.  Heat (pad or rice pillow in microwave) over affected area, 10-15 minutes twice daily.   OK to take Tylenol 1000 mg (2 extra strength tabs) or 975 mg (3 regular strength tabs) every 6 hours as needed.  No Advil. Take the meloxicam tomorrow.   Let us know if you need anything.

## 2021-12-14 NOTE — Progress Notes (Signed)
Chief Complaint  Patient presents with   Headache    Recovering from breast reduction surgery/has an infection from that. Headache daily for days    Subjective: Patient is a 51 y.o. female here for a headache.  2 weeks, she has had a constant headache. Took some ibuprofen that does help. She had a breast reduction surgery 3 weeks ago. Mainly located on the L side but feels it b/l. She has a hx of TMJ but does feel like that. No trauma to the head or neck region. Pressing on the head on L can alleviate things a little bit. Pain is achy and throbbing. Oxycodone does not help a whole lot. No weakness, trouble swallowing, difficulty w speech, vision changes, balance issues, numbness/tingling. Severity ranges 3 to making her cry.  Of note, she does not have issues when chewing.  Past Medical History:  Diagnosis Date   Acute mastitis of left breast 10/04/2007   Allergy    seasonal allergies   AMA (advanced maternal age) multigravida 35+    Cancer ([place]) 2002   Left leg melanoma   Celiac disease    Chronic back pain    H/O   Complication of anesthesia    Difficult time waking up   Constipation in pregnancy 03/2006   Endometriosis    h/o   Environmental allergies    Family history of breast cancer    Family history of ovarian cancer    Fibroid    During first pregnancy   Fibromyalgia    pt denies dx   First trimester bleeding 2008   GBS carrier    GERD (gastroesophageal reflux disease)    with certain foods-OTC PRN   Granuloma annulare    H/O amenorrhea 10/2006   H/O blood clots    7 yr ago in arm   H/O varicella    As an infant.   Headache(784.0)    Hearing loss 08/15/2016   Heart murmur    heard occassional at MD office   History of melanoma    HTN (hypertension)    on meds   Hyperlipidemia 03/02/2017   diet controlled   IBS (irritable bowel syndrome) 08/15/2016   Irregular uterine bleeding    Joint pain    Lactose intolerance    Leg edema    Leg pain, left  02/09/2016   Monilial vaginitis 07/27/2004   Palpitations    Rh negative status during pregnancy    SAB (spontaneous abortion)    h/o x 2   Seasonal allergies    Sleep apnea 08/15/2016   no CPAP use- uses nightguard   TMJ syndrome    Vegetarianism 08/15/2016   Vitamin D deficiency 08/16/2015    Objective: BP 128/82 (BP Location: Left Arm, Patient Position: Sitting, Cuff Size: Normal)   Pulse 85   Temp 98.4 F (36.9 C) (Oral)   Ht '5\' 4"'$  (1.626 m)   Wt 222 lb 6 oz (100.9 kg)   LMP 11/19/2010   SpO2 96%   BMI 38.17 kg/m  General: Awake, appears stated age Eyes: PERRLA, EOMI Neuro: DTRs equal and symmetric, no clonus, no cerebellar signs, 5/5 strength throughout, CN II through XII grossly intact MSK: Mild TTP over the left temporalis, not over the TMJs, suboccipital triangle, cervical paraspinal musculature, or trapezius musculature bilaterally Lungs: No accessory muscle use Psych: Age appropriate judgment and insight, normal affect and mood  Assessment and Plan: Nonintractable headache, unspecified chronicity pattern, unspecified headache type - Plan: ketorolac (TORADOL) 30 MG/ML injection  30 mg  Toradol injection today, start meloxicam tomorrow.  Tylenol, ice, heat as needed.  If no improvement the next few days, she will let me know and we will check an MRI of the brain.  It is unlikely she has temporal arteritis given lack of pain and issues with chewing as well as lack of vision changes. The patient voiced understanding and agreement to the plan.  [place], DO 12/14/21  11:56 AM

## 2021-12-15 NOTE — Progress Notes (Signed)
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 Marla Roe presents to clinic for postoperative follow-up.  She was last seen here in clinic on 12/13/2021.  At that time, she reports fevers between 100 to 101 F at home as well as discomfort and purulent drainage from inferior aspect of right breast.  She had just begun taking the doxycycline prescribed a couple of days prior.  Evidence of right breast cellulitis was noted on exam, lower suspicion for abscess.  Plan was for replacing dry gauze over right inferior T zone wound as needed to help facilitate continued drainage from the breast as well as continue with antibiotics.  Culture obtained was notable for abundant E. coli and few Pseudomonas.  We will treat with Cipro if symptoms persist.   Today, patient is feeling improved.  She states that she has not had any fevers since last encounter.  She also reports that the wound at inferior T zone opened up considerably and has been draining much more frequently.  This has alleviated a lot of her discomfort and has improved her redness.  She is on her last day of doxycycline.  Physical exam is largely reassuring.  Right breast with greatly improved erythema to inferior aspect of breast.  She does have a 3.5 x 2.5 x 1 cm wound at inferior T zone, some slough is debrided here at bedside with pickups and sharp scissors.  Moderate amount of drainage noted.  No other incisional wounds noted on the right breast.  0.5 cm wound at left inferior T zone, as well.  Nondraining.  Breasts are largely symmetric, good shape.  Recommend that patient replace gauze and ABD pads as needed to the wound at right inferior T zone.  Patient can expect continued drainage which has helped with her redness and discomfort considerably.  Continue to facilitate.  Suspect that it will heal without complication or difficulty.  Her inframammary  incisions look much less irritated than they did at previous encounter.  After discussion with patient, do not feel as though prescription ciprofloxacin is warranted given her improvement with the doxycycline.  However, she understands that if symptoms worsen (specifically fevers, worsening redness or swelling, worsening pain, worsening malodor) she can certainly call the clinic and Cipro would be provided.  However, suspect that it has largely resolved and that she will begin to heal without further complication or difficulty.  Picture(s) obtained of the patient and placed in the chart were with the patient's or guardian's permission.

## 2021-12-16 ENCOUNTER — Encounter: Payer: Self-pay | Admitting: Family Medicine

## 2021-12-17 ENCOUNTER — Ambulatory Visit (INDEPENDENT_AMBULATORY_CARE_PROVIDER_SITE_OTHER): Payer: 59 | Admitting: Physician Assistant

## 2021-12-17 DIAGNOSIS — Z9889 Other specified postprocedural states: Secondary | ICD-10-CM

## 2021-12-18 LAB — AEROBIC/ANAEROBIC CULTURE W GRAM STAIN (SURGICAL/DEEP WOUND)

## 2021-12-20 ENCOUNTER — Telehealth: Payer: Self-pay | Admitting: Plastic Surgery

## 2021-12-20 ENCOUNTER — Ambulatory Visit (INDEPENDENT_AMBULATORY_CARE_PROVIDER_SITE_OTHER): Payer: 59 | Admitting: Physician Assistant

## 2021-12-20 DIAGNOSIS — Z9889 Other specified postprocedural states: Secondary | ICD-10-CM

## 2021-12-20 MED ORDER — CIPROFLOXACIN HCL 500 MG PO TABS: 500.0000 mg | ORAL_TABLET | Freq: Two times a day (BID) | ORAL | 0 refills | Status: AC

## 2021-12-20 NOTE — Telephone Encounter (Signed)
Pt called and asked if she can be sent to a wound specialist? Pt stated she has an open wound after her surgery.

## 2021-12-20 NOTE — Progress Notes (Signed)
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 Marla Roe presents to clinic for postoperative follow-up.  She was last seen here in clinic on 12/17/2021.  At that time, she was feeling improved from a right-sided breast discomfort standpoint and reported that her fevers had resolved.  Culture obtained at prior encounter was notable for E. coli and a few Pseudomonas.  However, given her improvement with doxycycline, additional antibiotics with ciprofloxacin was not prescribed.  There was a large wound at inferior T zone right breast from which she was having moderate amount of drainage.  Some of the protruding slough was debrided at bedside, well-tolerated.  Plan was for gauze and ABD pads as needed to the wound and to anticipate continued drainage which has relieved her redness and discomfort.  She would call the clinic should she have any recurrence and breast swelling, redness, pain, and fevers.  Today, she tells me that her friend who is a nurse saw her wound and expressed considerable concern.  She is inquiring about referral to a wound care center for second opinion.  Patient reports that the wound has seemingly gotten a bit larger and is painful.  She has been dressing it with gauze and ABD pads as we discussed, but now the wound is no longer draining.  For that reason, the gauze will sometimes stick to her underlying granular tissue and cause mild bleeding upon removal.    She also reports that the inframammary incision on left breast is particularly tender and she feels as though it has become reddened and inflamed.  She is also concerned that something feels as though it is trying to put through the skin.  She denies any drainage or concerning wounds on that side.  She continues to wear breast binder because the compressive bra is rubbing on her inframammary incisions.  On physical exam, breasts have good  shape and symmetry.  The wound at the inferior T zone is now approximately 5 x 3.25 x 1 cm.  Clean appearing, some new granulation is noted.  No residual slough.  Remainder of incisions on right breast CDI.  The 0.5 cm wound at left inferior T zone has almost completely healed, nondraining.  Superficial.  There is some inflammation and erythema of the inframammary incision just lateral to the inferior T zone, spanning 2 to 3 cm.  Tender to the touch.  No visible sutures.  No fluctuance or crepitus.  No surrounding erythema or induration.  Remainder of incisions on left breast all CDI.  The wound at right inferior T zone got slightly larger since last encounter due to the residual nonviable tissue seen at last encounter sloughing off.  The wound is now completely clean, do not expect it to get any larger.  Instead, suspect that she will continue to see improved granulation from base of the wound.  No surrounding cellulitic changes.  As for the left breast, while there is some irritation and erythema of her inframammary incision spanning 2 to 3 cm, it still looks considerably improved from how it looked from the 12/10/2021 encounter.  Since she did have have redness and felt as though it improved with doxycycline, combined with the fact that she is concerned about her open wound right inferior T zone, will cover with a course of ciprofloxacin.  Shared decision making as to the risks and benefits.  She also had already called over to the wound care clinic here  in [place] and is requesting a referral.  While I suspect that they will defer management to [place], feel as though it is reasonable for her to get a second opinion if she would like.  She does tell me that she is appreciative of the postoperative care she is receiving here, as well.  Plan for her to return next week, as scheduled.  Call the clinic should she have any questions or concerns in the interim.  Picture(s) obtained of the patient and placed in the  chart were with the patient's or guardian's permission.

## 2021-12-22 ENCOUNTER — Other Ambulatory Visit: Payer: Self-pay | Admitting: Family Medicine

## 2021-12-22 MED ORDER — METHOCARBAMOL 500 MG PO TABS: 500.0000 mg | ORAL_TABLET | Freq: Three times a day (TID) | ORAL | 0 refills | Status: DC | PRN

## 2021-12-27 ENCOUNTER — Encounter: Payer: Self-pay | Admitting: Physician Assistant

## 2021-12-27 ENCOUNTER — Telehealth: Payer: Self-pay | Admitting: *Deleted

## 2021-12-27 ENCOUNTER — Ambulatory Visit (INDEPENDENT_AMBULATORY_CARE_PROVIDER_SITE_OTHER): Payer: 59 | Admitting: Physician Assistant

## 2021-12-27 ENCOUNTER — Other Ambulatory Visit: Payer: Self-pay | Admitting: Family Medicine

## 2021-12-27 DIAGNOSIS — Z9889 Other specified postprocedural states: Secondary | ICD-10-CM

## 2021-12-27 DIAGNOSIS — S21001D Unspecified open wound of right breast, subsequent encounter: Secondary | ICD-10-CM

## 2021-12-27 DIAGNOSIS — R519 Headache, unspecified: Secondary | ICD-10-CM

## 2021-12-27 NOTE — Telephone Encounter (Signed)
Faxed order to [facility] for supplies for the patient.  Confirmation received, and copy scanned into the chart.//AB/CMA

## 2021-12-27 NOTE — Progress Notes (Signed)
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 Marla Roe presents to clinic for postoperative follow-up.  She was last seen here in clinic on 12/20/2021.  At that time, she was concerned given the size of her right breast T-zone wound.  She also felt as though the inframammary incision on left breast became particularly tender and inflamed.  Exam was reassuring and inferior T zone was noted to be 5 x 3.25 x 1 cm with no concerns for infection.  There was some inflammation and erythema of the inframammary incision just lateral to the inferior T zone spanning 2 to 3 cm that was tender to the touch.  She was prescribed ciprofloxacin and referred to the wound care center, per her request.   Today, she states that the discomfort and redness involving left inframammary incision improved after the ciprofloxacin and she now has a stitch protruding through the area of concern that is amenable to removal here today.  As for her wound on the right side, she states that she has not seen any considerable improvement despite doing Vaseline and ABD pads daily.  She understands that needs to fill out from the bottom up.  She denies any pain at the site of the wound, but does endorse a little bit of "fullness" discomfort laterally on that breast.  She has an appointment with wound care later this week.  On exam, she still has a 5 x 3 x 0.75 cm wound over the inferior T zone, right breast.  No surrounding erythema.  Good granular tissue noted, but no significant change since pictures were obtained at last visit.  No surrounding skin changes or significant tenderness upon palpation.  Mild amount of clear yellow serosanguineous drainage.  Left breast appears to be well-healed, no wounds or areas of concern.  Applied collagen matrix dressing to the inferior T zone wound on right breast followed by ABD pad.  No moistening agent needed  given the mild amount of drainage from the wound.  Considered calcium alginate, as well.  Will place order with [facility].   No pictures updated today given lack of any considerable change since last encounter. Return in 10-14 days.

## 2021-12-30 ENCOUNTER — Encounter (HOSPITAL_BASED_OUTPATIENT_CLINIC_OR_DEPARTMENT_OTHER): Payer: 59 | Attending: Internal Medicine | Admitting: Internal Medicine

## 2021-12-30 ENCOUNTER — Telehealth: Payer: Self-pay | Admitting: *Deleted

## 2021-12-30 ENCOUNTER — Other Ambulatory Visit: Payer: Self-pay | Admitting: Physician Assistant

## 2021-12-30 DIAGNOSIS — N644 Mastodynia: Secondary | ICD-10-CM | POA: Insufficient documentation

## 2021-12-30 DIAGNOSIS — Z85828 Personal history of other malignant neoplasm of skin: Secondary | ICD-10-CM | POA: Diagnosis not present

## 2021-12-30 DIAGNOSIS — T8131XA Disruption of external operation (surgical) wound, not elsewhere classified, initial encounter: Secondary | ICD-10-CM | POA: Diagnosis not present

## 2021-12-30 DIAGNOSIS — T8140XA Infection following a procedure, unspecified, initial encounter: Secondary | ICD-10-CM | POA: Diagnosis not present

## 2021-12-30 DIAGNOSIS — T8131XD Disruption of external operation (surgical) wound, not elsewhere classified, subsequent encounter: Secondary | ICD-10-CM | POA: Diagnosis present

## 2021-12-30 NOTE — Progress Notes (Signed)
ARRYN, TERRONES [name] ([id number]) [id number] Page 1 of 9 Visit Report for 12/30/2021 Allergy List Details Patient Name: Date of Service: [name], [name] 12/30/2021 8:00 [name] Medical Record Number: [id number] Patient Account Number: [id number] Date of Birth/Sex: Treating RN: March 02, 1970 (51 y.o. [name] Primary Care Mckayla Mulcahey: Penni Homans Other Clinician: Referring Zlatan Hornback: Treating Jilian West/Extender: Eliezer Champagne in Treatment: 0 Allergies Active Allergies Cymbalta Betadine iodine Shellfish Containing Products prednisone Allergy Notes Electronic Signature(s) Signed: 12/30/2021 4:28:08 PM By: Adline Peals Entered By: Adline Peals on 12/30/2021 08:14:52 -------------------------------------------------------------------------------- Arrival Information Details Patient Name: Date of Service: [name]. 12/30/2021 8:00 [name] Medical Record Number: [id number] Patient Account Number: [id number] Date of Birth/Sex: Treating RN: [birth year] (51 y.o. [name] Primary Care Ziere Docken: Penni Homans Other Clinician: Referring Asyia Hornung: Treating Lundon Verdejo/Extender: Eliezer Champagne in Treatment: 0 Visit Information Patient Arrived: Ambulatory Arrival Time: 08:09 Accompanied By: self Transfer Assistance: None Patient Identification Verified: Yes Secondary Verification Process Completed: Yes Patient Requires Transmission-Based Precautions: No Patient Has Alerts: No Electronic Signature(s) Signed: 12/30/2021 4:28:08 PM By: Adline Peals Entered By: Adline Peals on 12/30/2021 08:11:34 [name] ([id number]) [id number] Page 2 of 9 -------------------------------------------------------------------------------- Clinic Level of Care Assessment Details Patient Name: Date of Service: [name] 12/30/2021 8:00 [name] Medical Record Number:  [id number] Patient Account Number: [id number] Date of Birth/Sex: Treating RN: Sep 20, 1970 (51 y.o. [name] Primary Care Elleah Hemsley: Penni Homans Other Clinician: Referring Jahzier Villalon: Treating Javarius Tsosie/Extender: Eliezer Champagne in Treatment: 0 Clinic Level of Care Assessment Items TOOL 2 Quantity Score X- 1 0 Use when only an EandM is performed on the INITIAL visit ASSESSMENTS - Nursing Assessment / Reassessment X- 1 20 General Physical Exam (combine w/ comprehensive assessment (listed just below) when performed on new pt. evals) X- 1 25 Comprehensive Assessment (HX, ROS, Risk Assessments, Wounds Hx, etc.) ASSESSMENTS - Wound and Skin [name] ssessment / Reassessment X - Simple Wound Assessment / Reassessment - one wound 1 5 '[]'$  - 0 Complex Wound Assessment / Reassessment - multiple wounds '[]'$  - 0 Dermatologic / Skin Assessment (not related to wound area) ASSESSMENTS - Ostomy and/or Continence Assessment and Care '[]'$  - 0 Incontinence Assessment and Management '[]'$  - 0 Ostomy Care Assessment and Management (repouching, etc.) PROCESS - Coordination of Care X - Simple Patient / Family Education for ongoing care 1 15 '[]'$  - 0 Complex (extensive) Patient / Family Education for ongoing care X- 1 10 Staff obtains Programmer, systems, Records, T Results / Process Orders est '[]'$  - 0 Staff telephones HHA, Nursing Homes / Clarify orders / etc '[]'$  - 0 Routine Transfer to another Facility (non-emergent condition) '[]'$  - 0 Routine Hospital Admission (non-emergent condition) X- 1 15 New Admissions / Biomedical engineer / Ordering NPWT Apligraf, etc. , '[]'$  - 0 Emergency Hospital Admission (emergent condition) X- 1 10 Simple Discharge Coordination '[]'$  - 0 Complex (extensive) Discharge Coordination PROCESS - Special Needs '[]'$  - 0 Pediatric / Minor Patient Management '[]'$  - 0 Isolation Patient Management '[]'$  - 0 Hearing / Language / Visual special needs '[]'$  - 0 Assessment of  Community assistance (transportation, D/C planning, etc.) '[]'$  - 0 Additional assistance / Altered mentation '[]'$  - 0 Support Surface(s) Assessment (bed, cushion, seat, etc.) INTERVENTIONS - Wound Cleansing / Measurement X- 1 5 Wound Imaging (photographs - any number of wounds) '[]'$  - 0 Wound Tracing (instead of photographs) X- 1 5 Simple Wound Measurement - one wound '[]'$  - 0 Complex  Wound Measurement - multiple wounds X- 1 5 Simple Wound Cleansing - one wound [name], [name] ([id number]) [id number] Page 3 of 9 '[]'$  - 0 Complex Wound Cleansing - multiple wounds INTERVENTIONS - Wound Dressings X - Small Wound Dressing one or multiple wounds 1 10 '[]'$  - 0 Medium Wound Dressing one or multiple wounds '[]'$  - 0 Large Wound Dressing one or multiple wounds '[]'$  - 0 Application of Medications - injection INTERVENTIONS - Miscellaneous '[]'$  - 0 External ear exam '[]'$  - 0 Specimen Collection (cultures, biopsies, blood, body fluids, etc.) '[]'$  - 0 Specimen(s) / Culture(s) sent or taken to Lab for analysis '[]'$  - 0 Patient Transfer (multiple staff / Harrel Lemon Lift / Similar devices) '[]'$  - 0 Simple Staple / Suture removal (25 or less) '[]'$  - 0 Complex Staple / Suture removal (26 or more) '[]'$  - 0 Hypo / Hyperglycemic Management (close monitor of Blood Glucose) '[]'$  - 0 Ankle / Brachial Index (ABI) - do not check if billed separately Has the patient been seen at the hospital within the last three years: Yes Total Score: 125 Level Of Care: New/Established - Level 4 Electronic Signature(s) Signed: 12/30/2021 4:28:08 PM By: Adline Peals Entered By: Adline Peals on 12/30/2021 08:57:04 -------------------------------------------------------------------------------- Encounter Discharge Information Details Patient Name: Date of Service: [name]. 12/30/2021 8:00 [name] Medical Record Number: [id number] Patient Account Number: [id number] Date of Birth/Sex: Treating  RN: August 11, 1970 (51 y.o. [name] Primary Care Klaus Casteneda: Penni Homans Other Clinician: Referring Kendall Arnell: Treating Albany Winslow/Extender: Eliezer Champagne in Treatment: 0 Encounter Discharge Information Items Discharge Condition: Stable Ambulatory Status: Ambulatory Discharge Destination: Home Transportation: Private Auto Accompanied By: self Schedule Follow-up Appointment: Yes Clinical Summary of Care: Patient Declined Electronic Signature(s) Signed: 12/30/2021 4:28:08 PM By: Adline Peals Entered By: Adline Peals on 12/30/2021 08:57:41 -------------------------------------------------------------------------------- Lower Extremity Assessment Details Patient Name: Date of Service: [name]. 12/30/2021 8:00 [name], [name] ([id number]) [id number] Page 4 of 9 Medical Record Number: [id number] Patient Account Number: [id number] Date of Birth/Sex: Treating RN: 09-18-1970 (51 y.o. [name] Primary Care Dequavius Kuhner: Penni Homans Other Clinician: Referring Albina Gosney: Treating Viveka Wilmeth/Extender: Eliezer Champagne in Treatment: 0 Electronic Signature(s) Signed: 12/30/2021 4:28:08 PM By: Adline Peals Entered By: Adline Peals on 12/30/2021 08:21:59 -------------------------------------------------------------------------------- Multi Wound Chart Details Patient Name: Date of Service: [name]. 12/30/2021 8:00 [name] Medical Record Number: [id number] Patient Account Number: [id number] Date of Birth/Sex: Treating RN: 03/17/1970 (51 y.o. F) Primary Care Johathon Overturf: Penni Homans Other Clinician: Referring Khrystina Bonnes: Treating Yaritzel Stange/Extender: Eliezer Champagne in Treatment: 0 Vital Signs Height(in): 64 Pulse(bpm): 79 Weight(lbs): 219 Blood Pressure(mmHg): 119/79 Body Mass Index(BMI): 37.6 Temperature(F): 97.9 Respiratory Rate(breaths/min):  18 [1:Photos:] [N/[name]:N/[name]] Right Breast N/[name] N/[name] Wound Location: Surgical Injury N/[name] N/[name] Wounding Event: Open Surgical Wound N/[name] N/[name] Primary Etiology: Sleep Apnea, Hypertension N/[name] N/[name] Comorbid History: 12/13/2021 N/[name] N/[name] Date Acquired: 0 N/[name] N/[name] Weeks of Treatment: Open N/[name] N/[name] Wound Status: No N/[name] N/[name] Wound Recurrence: 3.2x4.7x0.3 N/[name] N/[name] Measurements L x W x D (cm) 11.812 N/[name] N/[name] [name] (cm) : rea 3.544 N/[name] N/[name] Volume (cm) : 6 Starting Position 1 (o'clock): 10 Ending Position 1 (o'clock): 5 Maximum Distance 1 (cm): Yes N/[name] N/[name] Undermining: Full Thickness Without Exposed N/[name] N/[name] Classification: Support Structures Medium N/[name] N/[name] Exudate Amount: Serosanguineous N/[name] N/[name] Exudate Type: red, brown N/[name] N/[name] Exudate Color: Distinct, outline attached N/[name] N/[name] Wound Margin: Large (67-100%) N/[name] N/[name] Granulation Amount: Red N/[name] N/[name]  Granulation Quality: Small (1-33%) N/[name] N/[name] Necrotic Amount: Fat Layer (Subcutaneous Tissue): Yes N/[name] N/[name] Exposed Structures: Fascia: No Tendon: No Muscle: No Joint: No Bone: No None N/[name] N/[name] Epithelialization: Scarring: Yes N/[name] N/[name] Periwound Skin Texture: No Abnormalities Noted N/[name] N/[name] Periwound Skin Moisture: [name], [name] ([id number]) [id number] Page 5 of 9 No Abnormalities Noted N/[name] N/[name] Periwound Skin Color: No Abnormality N/[name] N/[name] Temperature: Treatment Notes Wound #1 (Breast) Wound Laterality: Right Cleanser Soap and Water Discharge Instruction: May shower and wash wound with dial antibacterial soap and water prior to dressing change. Wound Cleanser Discharge Instruction: Cleanse the wound with wound cleanser prior to applying [name] clean dressing using gauze sponges, not tissue or cotton balls. Peri-Wound Care Topical Gentamicin Discharge Instruction: As directed by physician Primary Dressing KerraCel Ag Gelling Fiber Dressing, 2x2 in (silver alginate) Discharge Instruction: Apply silver alginate to  wound bed as instructed Secondary Dressing ABD Pad, 5x9 Discharge Instruction: Apply over primary dressing as directed. Zetuvit Plus 4x8 in Discharge Instruction: Apply over primary dressing as directed. Secured With 105M Medipore H Soft Cloth Surgical T ape, 4 x 10 (in/yd) Discharge Instruction: Secure with tape as directed. Compression Wrap Compression Stockings Add-Ons Electronic Signature(s) Signed: 12/30/2021 4:03:30 PM By: Linton Ham MD Entered By: Linton Ham on 12/30/2021 09:15:03 -------------------------------------------------------------------------------- Multi-Disciplinary Care Plan Details Patient Name: Date of Service: [name]. 12/30/2021 8:00 [name] Medical Record Number: [id number] Patient Account Number: [id number] Date of Birth/Sex: Treating RN: 14-Aug-1970 (51 y.o. [name] Primary Care Willadeen Colantuono: Penni Homans Other Clinician: Referring Maddax Palinkas: Treating Katyra Tomassetti/Extender: Eliezer Champagne in Treatment: 0 Active Inactive Necrotic Tissue Nursing Diagnoses: Impaired tissue integrity related to necrotic/devitalized tissue Knowledge deficit related to management of necrotic/devitalized tissue Goals: Necrotic/devitalized tissue will be minimized in the wound bed Date Initiated: 12/30/2021 Target Resolution Date: 03/04/2022 Goal Status: Active [name], [name] ([id number]) [id number] Page 6 of 9 Patient/caregiver will verbalize understanding of reason and process for debridement of necrotic tissue Date Initiated: 12/30/2021 Target Resolution Date: 03/04/2022 Goal Status: Active Interventions: Assess patient pain level pre-, during and post procedure and prior to discharge Provide education on necrotic tissue and debridement process Treatment Activities: Apply topical anesthetic as ordered : 12/30/2021 Notes: Wound/Skin Impairment Nursing Diagnoses: Impaired tissue integrity Knowledge  deficit related to ulceration/compromised skin integrity Goals: Patient/caregiver will verbalize understanding of skin care regimen Date Initiated: 12/30/2021 Target Resolution Date: 03/04/2022 Goal Status: Active Interventions: Assess ulceration(s) every visit Treatment Activities: Skin care regimen initiated : 12/30/2021 Topical wound management initiated : 12/30/2021 Notes: Electronic Signature(s) Signed: 12/30/2021 4:28:08 PM By: Adline Peals Entered By: Adline Peals on 12/30/2021 08:56:10 -------------------------------------------------------------------------------- Pain Assessment Details Patient Name: Date of Service: [name]. 12/30/2021 8:00 [name] Medical Record Number: [id number] Patient Account Number: [id number] Date of Birth/Sex: Treating RN: June 19, 1970 (51 y.o. [name] Primary Care Cayson Kalb: Penni Homans Other Clinician: Referring Johny Pitstick: Treating Lesha Jager/Extender: Eliezer Champagne in Treatment: 0 Active Problems Location of Pain Severity and Description of Pain Patient Has Paino No Site Locations Rate the pain. Current Pain Level: 0 [name], [name] ([id number]) [id number] Page 7 of 9 Pain Management and Medication Current Pain Management: Electronic Signature(s) Signed: 12/30/2021 4:28:08 PM By: Adline Peals Entered By: Adline Peals on 12/30/2021 08:36:33 -------------------------------------------------------------------------------- Patient/Caregiver Education Details Patient Name: Date of Service: [name] 11/2/2023andnbsp8:00 [name] Medical Record Number: [id number] Patient Account Number: [id number] Date of Birth/Gender: Treating RN: 1970-06-01 (51 y.o. [name] Primary Care Physician:  Penni Homans Other Clinician: Referring Physician: Treating Physician/Extender: Eliezer Champagne in Treatment: 0 Education  Assessment Education Provided To: Patient Education Topics Provided Welcome T [facility] [place]: o Handouts: Welcome T [facility] [place] o Methods: Explain/Verbal, Printed Responses: Reinforcements needed, State content correctly Wound/Skin Impairment: Methods: Explain/Verbal Responses: Reinforcements needed, State content correctly Electronic Signature(s) Signed: 12/30/2021 4:28:08 PM By: Adline Peals Entered By: Adline Peals on 12/30/2021 08:55:30 -------------------------------------------------------------------------------- Wound Assessment Details Patient Name: Date of Service: [name]. 12/30/2021 8:00 [name] Medical Record Number: [id number] Patient Account Number: [id number] Date of Birth/Sex: Treating RN: Apr 15, 1970 (51 y.o. [name] Primary Care Zelene Barga: Penni Homans Other Clinician: Referring Verle Wheeling: Treating Carlitos Bottino/Extender: Eliezer Champagne in Treatment: 0 Wound Status Wound Number: 1 Primary Etiology: Open Surgical Wound Wound Location: Right Breast Wound Status: Open Wounding Event: Surgical Injury Comorbid History: Sleep Apnea, Hypertension Date Acquired: 12/13/2021 Weeks Of Treatment: 0 Clustered Wound: No Photos [name], [name] ([id number]) [id number] Page 8 of 9 Wound Measurements Length: (cm) 3.2 Width: (cm) 4.7 Depth: (cm) 0.3 Area: (cm) 11.812 Volume: (cm) 3.544 % Reduction in Area: % Reduction in Volume: Epithelialization: None Tunneling: No Undermining: Yes Starting Position (o'clock): 6 Ending Position (o'clock): 10 Maximum Distance: (cm) 5 Wound Description Classification: Full Thickness Without Exposed Support Structures Wound Margin: Distinct, outline attached Exudate Amount: Medium Exudate Type: Serosanguineous Exudate Color: red, brown Foul Odor After Cleansing: No Slough/Fibrino Yes Wound Bed Granulation Amount: Large (67-100%)  Exposed Structure Granulation Quality: Red Fascia Exposed: No Necrotic Amount: Small (1-33%) Fat Layer (Subcutaneous Tissue) Exposed: Yes Necrotic Quality: Adherent Slough Tendon Exposed: No Muscle Exposed: No Joint Exposed: No Bone Exposed: No Periwound Skin Texture Texture Color No Abnormalities Noted: No No Abnormalities Noted: Yes Scarring: Yes Temperature / Pain Temperature: No Abnormality Moisture No Abnormalities Noted: Yes Treatment Notes Wound #1 (Breast) Wound Laterality: Right Cleanser Soap and Water Discharge Instruction: May shower and wash wound with dial antibacterial soap and water prior to dressing change. Wound Cleanser Discharge Instruction: Cleanse the wound with wound cleanser prior to applying [name] clean dressing using gauze sponges, not tissue or cotton balls. Peri-Wound Care Topical Gentamicin Discharge Instruction: As directed by physician Primary Dressing KerraCel Ag Gelling Fiber Dressing, 2x2 in (silver alginate) Discharge Instruction: Apply silver alginate to wound bed as instructed Secondary Dressing ABD Pad, 5x9 Discharge Instruction: Apply over primary dressing as directed. Zetuvit Plus 4x8 in Discharge Instruction: Apply over primary dressing as directed. [name], [name] ([id number]) [id number] Page 9 of 9 Secured With 25M Medipore H Soft Cloth Surgical T ape, 4 x 10 (in/yd) Discharge Instruction: Secure with tape as directed. Compression Wrap Compression Stockings Add-Ons Electronic Signature(s) Signed: 12/30/2021 4:28:08 PM By: Adline Peals Entered By: Adline Peals on 12/30/2021 08:47:41 -------------------------------------------------------------------------------- Vitals Details Patient Name: Date of Service: [name]. 12/30/2021 8:00 [name] Medical Record Number: [id number] Patient Account Number: [id number] Date of Birth/Sex: Treating RN: August 04, 1970 (51 y.o. [name] Primary Care Chrislynn Mosely: Penni Homans Other Clinician: Referring Jeriko Kowalke: Treating Dmonte Maher/Extender: Eliezer Champagne in Treatment: 0 Vital Signs Time Taken: 08:11 Temperature (F): 97.9 Height (in): 64 Pulse (bpm): 79 Source: Stated Respiratory Rate (breaths/min): 18 Weight (lbs): 219 Blood Pressure (mmHg): 119/79 Source: Stated Reference Range: 80 - 120 mg / dl Body Mass Index (BMI): 37.6 Electronic Signature(s) Signed: 12/30/2021 4:28:08 PM By: Adline Peals Entered By: Adline Peals on 12/30/2021 08:14:30

## 2021-12-30 NOTE — Telephone Encounter (Signed)
Received on (12/27/21) via of fax Order Status Notification from [facility] stating:[facility] will be contacting the patient with pricing options.  Their insurance plan does not cover the supplies ordered under their benefits.  Thank you for your patient!  Copy scanned into the chart.//AB/CMA

## 2021-12-30 NOTE — Progress Notes (Signed)
Natalie Natalie Natalie ([id number]) [id number] Page 1 of 8 Visit Report for 12/30/2021 Chief Complaint Document Details Patient Name: Date of Service: Natalie Natalie, Natalie Natalie 12/30/2021 8:00 Natalie Natalie Medical Record Number: [id number] Patient Account Number: [id number] Date of Birth/Sex: Treating RN: 1970/09/20 (51 y.o. F) Primary Care Provider: Penni Homans Other Clinician: Referring Provider: Treating Provider/Extender: Eliezer Champagne in Treatment: 0 Information Obtained from: Patient Chief Complaint 12/30/2021; patient is here for review of Natalie right breast wound Electronic Signature(s) Signed: 12/30/2021 4:03:30 PM By: Linton Ham MD Entered By: Linton Ham on 12/30/2021 09:15:28 -------------------------------------------------------------------------------- HPI Details Patient Name: Date of Service: Natalie Feil Natalie. 12/30/2021 8:00 Natalie Natalie Medical Record Number: [id number] Patient Account Number: [id number] Date of Birth/Sex: Treating RN: 04/22/1970 (51 y.o. F) Primary Care Provider: Penni Homans Other Clinician: Referring Provider: Treating Provider/Extender: Eliezer Champagne in Treatment: 0 History of Present Illness HPI Description: ADMISSION 12/30/2021 This is Natalie Natalie who is here for review of Natalie wound on her right breast. She had bilateral breast reduction surgery on 11/24/2021 by Dr. Marla Roe and since has been followed in our office. It is Natalie bit difficult to determine exactly when the wound opened but the patient states it was very soon after surgery sees says that the area was Steri-Stripped but there was fluid leaking through and she has since developed an open wound. She has had this debrided and most recently cultured showing Pseudomonas and E. coli she was given Natalie course of ciprofloxacin which she completed earlier this week. She had been using Vaseline gauze was switched to collagen. She has not  started this yet she has simply been using the Vaseline gauze. 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 She has not been systemically unwell. She has some pain in the left breast incision but has no open wound here. Electronic Signature(s) Signed: 12/30/2021 4:03:30 PM By: Linton Ham MD Entered By: Linton Ham on 12/30/2021 09:17:43 Physical Exam Details -------------------------------------------------------------------------------- Natalie Natalie ([id number]) [id number] Page 2 of 8 Patient Name: Date of Service: Natalie Natalie, Natalie Natalie 12/30/2021 8:00 Natalie Natalie Medical Record Number: [id number] Patient Account Number: [id number] Date of Birth/Sex: Treating RN: 10-12-1970 (51 y.o. F) Primary Care Provider: Penni Homans Other Clinician: Referring Provider: Treating Provider/Extender: Eliezer Champagne in Treatment: 0 Constitutional Sitting or standing Blood Pressure is within target range for patient.. Pulse regular and within target range for patient.[name] Respirations regular, non-labored and within target range.. Temperature is normal and within the target range for the patient.[name] Appears in no distress. Notes Wound exam; right breast inframamillary area midline. Fairly substantial wound. At roughly 7-9 o'clock she has an undermining area at 5 cm. There was some murky sanguinous drainage that came out from this area that I have recultured. Careful palpation did not reveal any tenderness no ballotable fluid collection that I could detect. There was no erythema Electronic Signature(s) Signed: 12/30/2021 4:03:30 PM By: Linton Ham MD Entered By: Linton Ham on 12/30/2021 09:19:03 -------------------------------------------------------------------------------- Physician Orders Details Patient Name: Date of Service: Natalie Feil Natalie. 12/30/2021 8:00 Natalie Natalie Medical Record Number: [id number] Patient Account  Number: [id number] Date of Birth/Sex: Treating RN: [birth year] (51 y.o. Harlow Ohms Primary Care Provider: Penni Homans Other Clinician: Referring Provider: Treating Provider/Extender: Eliezer Champagne in Treatment: 0 Verbal / Phone Orders: No Diagnosis Coding Follow-up Appointments ppointment in 2 weeks. - Dr. Heber [place] Return Natalie Anesthetic (In  clinic) Topical Lidocaine 5% applied to wound bed Bathing/ Shower/ Hygiene May shower and wash wound with soap and water. Off-Loading Other: - try to keep pressure off of wound as best you can Wound Treatment Wound #1 - Breast Wound Laterality: Right Cleanser: Soap and Water 1 x Per Day/30 Days Discharge Instructions: May shower and wash wound with dial antibacterial soap and water prior to dressing change. Cleanser: Wound Cleanser 1 x Per Day/30 Days Discharge Instructions: Cleanse the wound with wound cleanser prior to applying Natalie clean dressing using gauze sponges, not tissue or cotton balls. Topical: Gentamicin 1 x Per Day/30 Days Discharge Instructions: As directed by physician Prim Dressing: KerraCel Ag Gelling Fiber Dressing, 4x5 in (silver alginate) (DME) (Generic) 1 x Per Day/30 Days ary Discharge Instructions: Apply silver alginate to wound bed as instructed Secondary Dressing: ABD Pad, 5x9 (DME) (Generic) 1 x Per Day/30 Days Discharge Instructions: Apply over primary dressing as directed. Secondary Dressing: Zetuvit Plus 4x8 in (DME) (Generic) 1 x Per Day/30 Days Discharge Instructions: Apply over primary dressing as directed. Secured With: 45M Medipore H Soft Cloth Surgical T ape, 4 x 10 (in/yd) (DME) (Generic) 1 x Per Day/30 Days Discharge Instructions: Secure with tape as directed. LAURISSA, COWPER Natalie ([id number]) [id number] Page 3 of 8 Patient Medications llergies: Cymbalta, Betadine, iodine, Shellfish Containing Products, prednisone Natalie Notifications Medication Indication  Start End 12/30/2021 lidocaine DOSE topical 5 % ointment - ointment topical wound infection 12/30/2021 Cipro DOSE oral 500 mg tablet - 1 tablet oral bid for 7 days 12/30/2021 gentamicin DOSE topical 0.1 % ointment - ointment topical to wound daily Electronic Signature(s) Signed: 12/30/2021 9:06:28 AM By: Linton Ham MD Entered By: Linton Ham on 12/30/2021 09:06:27 -------------------------------------------------------------------------------- Problem List Details Patient Name: Date of Service: Natalie Feil Natalie. 12/30/2021 8:00 Natalie Natalie Medical Record Number: [id number] Patient Account Number: [id number] Date of Birth/Sex: Treating RN: December 11, 1970 (51 y.o. F) Primary Care Provider: Penni Homans Other Clinician: Referring Provider: Treating Provider/Extender: Eliezer Champagne in Treatment: 0 Active Problems ICD-10 Encounter Code Description Active Date MDM Diagnosis T81.31XD Disruption of external operation (surgical) wound, not elsewhere classified, 12/30/2021 No Yes subsequent encounter S21.001D Unspecified open wound of right breast, subsequent encounter 12/30/2021 No Yes T81.41XS Infection following Natalie procedure, superficial incisional surgical site, sequela 12/30/2021 No Yes Inactive Problems Resolved Problems Electronic Signature(s) Signed: 12/30/2021 4:03:30 PM By: Linton Ham MD Entered By: Linton Ham on 12/30/2021 09:25:13 -------------------------------------------------------------------------------- Progress Note Details Patient Name: Date of Service: Natalie Feil Natalie. 12/30/2021 8:00 Natalie Natalie Medical Record Number: [id number] Patient Account Number: [id number] Natalie Natalie, Natalie Natalie Natalie ([id number]) [id number] Page 4 of 8 Date of Birth/Sex: Treating RN: [birth year] (51 y.o. F) Primary Care Provider: Other Clinician: Penni Homans Referring Provider: Treating Provider/Extender: Eliezer Champagne in  Treatment: 0 Subjective Chief Complaint Information obtained from Patient 12/30/2021; patient is here for review of Natalie right breast wound History of Present Illness (HPI) ADMISSION 12/30/2021 This is Natalie 51 year old [name] who is here for review of Natalie wound on her right breast. She had bilateral breast reduction surgery on 11/24/2021 by Dr. Marla Roe and since has been followed in our office. It is Natalie bit difficult to determine exactly when the wound opened but the patient states it was very soon after surgery sees says that the area was Steri-Stripped but there was fluid leaking through and she has since developed an open wound. She has had this debrided and most recently cultured showing Pseudomonas and E. coli she was  given Natalie course of ciprofloxacin which she completed earlier this week. She had been using Vaseline gauze was switched to collagen. She has not started this yet she has simply been using the Vaseline gauze. 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 She has not been systemically unwell. She has some pain in the left breast incision but has no open wound here. Patient History Information obtained from Patient. Allergies Cymbalta, Betadine, iodine, Shellfish Containing Products, prednisone Family History Cancer - Mother,Father,Maternal Grandparents, Heart Disease - Father,Mother, Hypertension - Mother,Father, No family history of Diabetes, Hereditary Spherocytosis, Kidney Disease, Lung Disease, Seizures, Stroke, Thyroid Problems, Tuberculosis. Social History Never smoker, Marital Status - Married, Alcohol Use - Never, Drug Use - No History, Caffeine Use - Daily. Medical History Respiratory Patient has history of Sleep Apnea Cardiovascular Patient has history of Hypertension Endocrine Denies history of Type I Diabetes, Type II Diabetes Hospitalization/Surgery History - breast reduction. - bilateral knee replacements. - hysterectomy. - hernia  repair. - appendectomy. Medical Natalie Surgical History Notes nd Ear/Nose/Mouth/Throat TMJ Oncologic skin cancer hx Review of Systems (ROS) Constitutional Symptoms (General Health) Denies complaints or symptoms of Fatigue, Fever, Chills, Marked Weight Change. Eyes Denies complaints or symptoms of Dry Eyes, Vision Changes, Glasses / Contacts. Ear/Nose/Mouth/Throat Denies complaints or symptoms of Chronic sinus problems or rhinitis. Gastrointestinal Denies complaints or symptoms of Frequent diarrhea, Nausea, Vomiting. Endocrine Denies complaints or symptoms of Heat/cold intolerance. Genitourinary Denies complaints or symptoms of Frequent urination. Integumentary (Skin) Complains or has symptoms of Wounds. Musculoskeletal Denies complaints or symptoms of Muscle Pain, Muscle Weakness. Neurologic Denies complaints or symptoms of Numbness/parasthesias. Psychiatric Denies complaints or symptoms of Claustrophobia. Objective Constitutional Natalie Natalie, Natalie Natalie Natalie ([id number]) [id number] Page 5 of 8 Sitting or standing Blood Pressure is within target range for patient.. Pulse regular and within target range for patient.[name] Respirations regular, non-labored and within target range.. Temperature is normal and within the target range for the patient.[name] Appears in no distress. Vitals Time Taken: 8:11 AM, Height: 64 in, Source: Stated, Weight: 219 lbs, Source: Stated, BMI: 37.6, Temperature: 97.9 F, Pulse: 79 bpm, Respiratory Rate: 18 breaths/min, Blood Pressure: 119/79 mmHg. General Notes: Wound exam; right breast inframamillary area midline. Fairly substantial wound. At roughly 7-9 o'clock she has an undermining area at 5 cm. There was some murky sanguinous drainage that came out from this area that I have recultured. Careful palpation did not reveal any tenderness no ballotable fluid collection that I could detect. There was no erythema Integumentary (Hair, Skin) Wound #1  status is Open. Original cause of wound was Surgical Injury. The date acquired was: 12/13/2021. The wound is located on the Right Breast. The wound measures 3.2cm length x 4.7cm width x 0.3cm depth; 11.812cm^2 area and 3.544cm^3 volume. There is Fat Layer (Subcutaneous Tissue) exposed. There is no tunneling noted, however, there is undermining starting at 6:00 and ending at 10:00 with Natalie maximum distance of 5cm. There is Natalie medium amount of serosanguineous drainage noted. The wound margin is distinct with the outline attached to the wound base. There is large (67-100%) red granulation within the wound bed. There is Natalie small (1-33%) amount of necrotic tissue within the wound bed including Adherent Slough. The periwound skin appearance had no abnormalities noted for moisture. The periwound skin appearance had no abnormalities noted for color. The periwound skin appearance exhibited: Scarring. Periwound temperature was noted as No Abnormality. Assessment Active Problems ICD-10 Disruption of external operation (surgical) wound, not elsewhere  classified, subsequent encounter Unspecified open wound of right breast, subsequent encounter Plan Follow-up Appointments: Return Appointment in 2 weeks. - Dr. Heber [place] Anesthetic: (In clinic) Topical Lidocaine 5% applied to wound bed Bathing/ Shower/ Hygiene: May shower and wash wound with soap and water. Off-Loading: Other: - try to keep pressure off of wound as best you can The following medication(s) was prescribed: lidocaine topical 5 % ointment ointment topical was prescribed at facility Cipro oral 500 mg tablet 1 tablet oral bid for 7 days for wound infection starting 12/30/2021 gentamicin topical 0.1 % ointment ointment topical to wound daily starting 12/30/2021 WOUND #1: - Breast Wound Laterality: Right Cleanser: Soap and Water 1 x Per Day/30 Days Discharge Instructions: May shower and wash wound with dial antibacterial soap and water prior to dressing  change. Cleanser: Wound Cleanser 1 x Per Day/30 Days Discharge Instructions: Cleanse the wound with wound cleanser prior to applying Natalie clean dressing using gauze sponges, not tissue or cotton balls. Topical: Gentamicin 1 x Per Day/30 Days Discharge Instructions: As directed by physician Prim Dressing: KerraCel Ag Gelling Fiber Dressing, 4x5 in (silver alginate) (DME) (Generic) 1 x Per Day/30 Days ary Discharge Instructions: Apply silver alginate to wound bed as instructed Secondary Dressing: ABD Pad, 5x9 (DME) (Generic) 1 x Per Day/30 Days Discharge Instructions: Apply over primary dressing as directed. Secondary Dressing: Zetuvit Plus 4x8 in (DME) (Generic) 1 x Per Day/30 Days Discharge Instructions: Apply over primary dressing as directed. Secured With: 54M Medipore H Soft Cloth Surgical T ape, 4 x 10 (in/yd) (DME) (Generic) 1 x Per Day/30 Days Discharge Instructions: Secure with tape as directed. 1. Surgical wound status post bilateral reduction mammoplasty. The wound on the right breast actually has healthy looking granulation however she has Natalie fairly big tunneled area from about 7-9 o'clock with some purulent looking drainage. I have cultured this. 2. I think the cultured material done in the plastic surgical office should be treated aggressively. Towards that end I have restarted her ciprofloxacin for Natalie week added topical gentamicin under silver alginate 3. The other issue here is keeping this wound free of pressure both from her bra as well as the underlying abdomenal wall. We will use ABDs. I have asked her to use what ever will keep the dressing in place that does not put excessive pressure over the wound. She apparently has Natalie binder which might suffice. She also had something she bought off [employer] that looked like it might work. 4. Careful instructions not to put pressure on this wound Electronic Signature(s) Signed: 12/30/2021 4:03:30 PM By: Linton Ham MD Entered By: Linton Ham on 12/30/2021 09:21:44 Natalie Natalie ([id number]) [id number] Page 6 of 8 -------------------------------------------------------------------------------- HxROS Details Patient Name: Date of Service: Natalie Natalie, Natalie Natalie. 12/30/2021 8:00 Natalie Natalie Medical Record Number: [id number] Patient Account Number: [id number] Date of Birth/Sex: Treating RN: 1970/05/27 (51 y.o. Harlow Ohms Primary Care Provider: Penni Homans Other Clinician: Referring Provider: Treating Provider/Extender: Eliezer Champagne in Treatment: 0 Information Obtained From Patient Constitutional Symptoms (General Health) Complaints and Symptoms: Negative for: Fatigue; Fever; Chills; Marked Weight Change Eyes Complaints and Symptoms: Negative for: Dry Eyes; Vision Changes; Glasses / Contacts Ear/Nose/Mouth/Throat Complaints and Symptoms: Negative for: Chronic sinus problems or rhinitis Medical History: Past Medical History Notes: TMJ Gastrointestinal Complaints and Symptoms: Negative for: Frequent diarrhea; Nausea; Vomiting Medical History: Past Medical History Notes: celiac disease Endocrine Complaints and Symptoms: Negative for: Heat/cold intolerance Medical History: Negative for: Type I Diabetes; Type II Diabetes  Genitourinary Complaints and Symptoms: Negative for: Frequent urination Integumentary (Skin) Complaints and Symptoms: Positive for: Wounds Musculoskeletal Complaints and Symptoms: Negative for: Muscle Pain; Muscle Weakness Neurologic Complaints and Symptoms: Negative for: Numbness/parasthesias Psychiatric Complaints and Symptoms: Negative for: Natalie Natalie, Natalie Natalie ([id number]) [id number] Page 7 of 8 Hematologic/Lymphatic Respiratory Medical History: Positive for: Sleep Apnea Cardiovascular Medical History: Positive for: Hypertension Immunological Oncologic Medical History: Past Medical  History Notes: skin cancer hx Immunizations Pneumococcal Vaccine: Received Pneumococcal Vaccination: No Implantable Devices None Hospitalization / Surgery History Type of Hospitalization/Surgery breast reduction bilateral knee replacements hysterectomy hernia repair appendectomy Family and Social History Cancer: Yes - Mother,Father,Maternal Grandparents; Diabetes: No; Heart Disease: Yes - Father,Mother; Hereditary Spherocytosis: No; Hypertension: Yes - Mother,Father; Kidney Disease: No; Lung Disease: No; Seizures: No; Stroke: No; Thyroid Problems: No; Tuberculosis: No; Never smoker; Marital Status - Married; Alcohol Use: Never; Drug Use: No History; Caffeine Use: Daily; Financial Concerns: No; Food, Clothing or Shelter Needs: No; Support System Lacking: No; Transportation Concerns: No Electronic Signature(s) Signed: 12/30/2021 4:03:30 PM By: Linton Ham MD Signed: 12/30/2021 4:28:08 PM By: Adline Peals Entered By: Adline Peals on 12/30/2021 09:35:00 -------------------------------------------------------------------------------- [place] Details Patient Name: Date of Service: Natalie Feil Natalie. 12/30/2021 Medical Record Number: [id number] Patient Account Number: [id number] Date of Birth/Sex: Treating RN: 08-Oct-1970 (51 y.o. Harlow Ohms Primary Care Provider: Penni Homans Other Clinician: Referring Provider: Treating Provider/Extender: Eliezer Champagne in Treatment: 0 Diagnosis Coding ICD-10 Codes Code Description T81.31XD Disruption of external operation (surgical) wound, not elsewhere classified, subsequent encounter S21.001D Unspecified open wound of right breast, subsequent encounter T81.41XS Infection following Natalie procedure, superficial incisional surgical site, sequela Facility Procedures Natalie Natalie, Natalie Natalie ([id number]): CPT4 Code Description [id number] [place] VISIT-LEV 4 EST PT [id number]  Page 8 of 8: Modifier Quantity 1 Physician Procedures : CPT4 Code Description Modifier [id number] [phone] - WC PHYS LEVEL 4 - NEW PT ICD-10 Diagnosis Description T81.31XD Disruption of external operation (surgical) wound, not elsewhere classified, subsequent encounter S21.001D Unspecified open wound of right  breast, subsequent encounter T81.41XS Infection following Natalie procedure, superficial incisional surgical site, sequela Quantity: 1 Electronic Signature(s) Signed: 12/30/2021 4:03:30 PM By: Linton Ham MD Entered By: Linton Ham on 12/30/2021 09:23:51

## 2021-12-30 NOTE — Progress Notes (Signed)
[name], PEDRAZA [name] ([id number]) [phone].pdf Page 1 of 4 Visit Report for 12/30/2021 Abuse Risk Screen Details Patient Name: Date of Service: [name], [name]. 12/30/2021 8:00 [name] Medical Record Number: [id number] Patient Account Number: [id number] Date of Birth/Sex: Treating RN: 1971-01-21 (51 y.o. [name] Primary Care [name]: Penni Homans Other Clinician: Referring [name]: Treating [name]/Extender: [name] in Treatment: 0 Abuse Risk Screen Items Answer ABUSE RISK SCREEN: Has anyone close to you tried to hurt or harm you recentlyo No Do you feel uncomfortable with anyone in your familyo No Has anyone forced you do things that you didnt want to doo No Electronic Signature(s) Signed: 12/30/2021 4:28:08 PM By: [name] Entered By: [name] on 12/30/2021 08:20:38 -------------------------------------------------------------------------------- Activities of Daily Living Details Patient Name: Date of Service: [name], [name]. 12/30/2021 8:00 [name] Medical Record Number: [id number] Patient Account Number: [id number] Date of Birth/Sex: Treating RN: 03-Jul-1970 (51 y.o. [name] Primary Care Keyanni Whittinghill: Penni Homans Other Clinician: Referring Samaad [name]: Treating [name]/Extender: [name] in Treatment: 0 Activities of Daily Living Items Answer Activities of Daily Living (Please select one for each item) Drive Automobile Completely Able T Medications ake Completely Able Use T elephone Completely Able Care for Appearance Completely Able Use T oilet Completely Able Bath / Shower Completely Able Dress Self Completely Able Feed Self Completely Able Walk Completely Able Get In / Out Bed Completely Able Housework Completely Able Prepare Meals Completely [place] Completely Able Shop for Self Completely Able Electronic Signature(s) Signed:  12/30/2021 4:28:08 PM By: [name] Entered By: [name] on 12/30/2021 08:20:55 [name] ([id number]) [id number] Page 2 of 4 -------------------------------------------------------------------------------- Education Screening Details Patient Name: Date of Service: [name], [name]. 12/30/2021 8:00 [name] Medical Record Number: [id number] Patient Account Number: [id number] Date of Birth/Sex: Treating RN: 16-Jul-1970 (51 y.o. [name] Primary Care Byrd [name]: Penni Homans Other Clinician: Referring [name]: Treating [name]/Extender: [name] in Treatment: 0 Primary Learner Assessed: Patient Learning Preferences/Education Level/Primary Language Learning Preference: Explanation, Demonstration, Video, Printed Material Highest Education Level: College or Above Preferred Language: English Cognitive Barrier Language Barrier: No Translator Needed: No Memory Deficit: No Emotional Barrier: No Cultural/Religious Beliefs Affecting Medical Care: No Physical Barrier Impaired Vision: No Impaired Hearing: No Decreased Hand dexterity: No Knowledge/Comprehension Knowledge Level: Medium Comprehension Level: Medium Ability to understand written instructions: Medium Ability to understand verbal instructions: Medium Motivation Anxiety Level: Calm Cooperation: Cooperative Education Importance: Acknowledges Need Interest in Health Problems: Asks Questions Perception: Coherent Willingness to Engage in Self-Management Medium Activities: Readiness to Engage in Self-Management Medium Activities: Electronic Signature(s) Signed: 12/30/2021 4:28:08 PM By: [name] Entered By: [name] on 12/30/2021 08:21:29 -------------------------------------------------------------------------------- Fall Risk Assessment Details Patient Name: Date of Service: Natalie Feil [name]. 12/30/2021 8:00 [name]  M Medical Record Number: [id number] Patient Account Number: [id number] Date of Birth/Sex: Treating RN: [birth year] (51 y.o. [name] Primary Care [name]: Penni Homans Other Clinician: Referring [name]: Treating [name]/Extender: [name] in Treatment: 0 Fall Risk Assessment Items Have you had 2 or more falls in the last 12 monthso 0 No [name], [name] ([id number]) [id number] Page 3 of 4 Have you had any fall that resulted in injury in the last 12 monthso 0 No FALLS RISK SCREEN History of falling - immediate or within 3 months 0 No Secondary diagnosis (Do you have 2 or more medical diagnoseso) 0 No Ambulatory aid None/bed  rest/wheelchair/nurse 0 Yes Crutches/cane/walker 0 No Furniture 0 No Intravenous therapy Access/Saline/Heparin Lock 0 No Gait/Transferring Normal/ bed rest/ wheelchair 0 Yes Weak (short steps with or without shuffle, stooped but able to lift head while walking, may seek 0 No support from furniture) Impaired (short steps with shuffle, may have difficulty arising from chair, head down, impaired 0 No balance) Mental Status Oriented to own ability 0 Yes Electronic Signature(s) Signed: 12/30/2021 4:28:08 PM By: [name] Entered By: [name] on 12/30/2021 08:21:39 -------------------------------------------------------------------------------- Foot Assessment Details Patient Name: Date of Service: Natalie Feil [name]. 12/30/2021 8:00 [name] Medical Record Number: [id number] Patient Account Number: [id number] Date of Birth/Sex: Treating RN: May 06, 1970 (51 y.o. [name] Primary Care Mariadelosang Wynns: Penni Homans Other Clinician: Referring Alante Tolan: Treating [name]/Extender: [name] in Treatment: 0 Foot Assessment Items Site Locations + = Sensation present, - = Sensation absent, C = Callus, U = Ulcer R = Redness, W = Warmth, M =  Maceration, PU = Pre-ulcerative lesion F = Fissure, S = Swelling, D = Dryness Assessment Right: Left: Other Deformity: No No Prior Foot Ulcer: No No Prior Amputation: No No Charcot Joint: No No Ambulatory Status: GaitAMIYAH, [name] ([id number]) [id number] Page 4 of 4 Electronic Signature(s) Signed: 12/30/2021 4:28:08 PM By: [name] Entered By: [name] on 12/30/2021 08:21:56 -------------------------------------------------------------------------------- Nutrition Risk Screening Details Patient Name: Date of Service: [name], [name]. 12/30/2021 8:00 [name] Medical Record Number: [id number] Patient Account Number: [id number] Date of Birth/Sex: Treating RN: [birth year] (51 y.o. [name] Primary Care Sahaana Weitman: Penni Homans Other Clinician: Referring Tracina Beaumont: Treating Orvin Netter/Extender: [name] in Treatment: 0 Height (in): 64 Weight (lbs): 219 Body Mass Index (BMI): 37.6 Nutrition Risk Screening Items Score Screening NUTRITION RISK SCREEN: I have an illness or condition that made me change the kind and/or amount of food I eat 0 No I eat fewer than two meals per day 0 No I eat few fruits and vegetables, or milk products 0 No I have three or more drinks of beer, liquor or wine almost every day 0 No I have tooth or mouth problems that make it hard for me to eat 0 No I don't always have enough money to buy the food I need 0 No I eat alone most of the time 0 No I take three or more different prescribed or over-the-counter drugs [name] day 1 Yes Without wanting to, I have lost or gained 10 pounds in the last six months 0 No I am not always physically able to shop, cook and/or feed myself 0 No Nutrition Protocols Good Risk Protocol 0 No interventions needed Moderate Risk Protocol High Risk Proctocol Risk Level: Good Risk Score: 1 Electronic Signature(s) Signed: 12/30/2021 4:28:08 PM By:  [name] Entered By: [name] on 12/30/2021 08:21:52

## 2022-01-04 LAB — AEROBIC/ANAEROBIC CULTURE W GRAM STAIN (SURGICAL/DEEP WOUND): Gram Stain: NONE SEEN

## 2022-01-10 ENCOUNTER — Ambulatory Visit (INDEPENDENT_AMBULATORY_CARE_PROVIDER_SITE_OTHER): Payer: 59 | Admitting: Physician Assistant

## 2022-01-10 DIAGNOSIS — Z719 Counseling, unspecified: Secondary | ICD-10-CM

## 2022-01-10 DIAGNOSIS — Z9889 Other specified postprocedural states: Secondary | ICD-10-CM

## 2022-01-10 NOTE — Progress Notes (Signed)
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 Marla Roe presents to clinic for postoperative follow-up.   She was last seen here in clinic on 12/27/2021.  At that time, 5 x 3 x 0.75 cm wound was noted over the inferior T zone, right breast.  No surrounding erythema.  Good granular tissue noted, but no considerable advanced epithelialization since previous visit.  Left breast appear to be well-healed without any areas of concern.  A moistened collagen matrix applied to the inferior T zone wound in the right breast followed by ABD pad.  She then was seen by the wound care center, Dr. Dellia Nims, 12/30/2021.  He recultured the wound.  Provided instructions to wash the wound with soap and water/use a wound cleanser and topical gentamicin x30 days.  Patient was also prescribed a 4 x 5 inch KerraCel AG fiber dressing with overlying Zetuvit 4 x 8 inch overlying bandage secured with Medipore tape.  Today, patient is doing well.  She states that she has been following the wound care regimen outlined by the wound care clinic, as directed.  She tells me that her wound is no longer draining much at all so the calcium alginate becomes stuck on the wound base and mild bleeding is provoked at time of dressing changes.  However, she states that she has seen considerable improvement since last encounter and that overall she is doing quite well.  She inquires about scar mitigation treatments for the remainder of her incisions as well as ongoing activity restrictions.  She denies any fevers or worsening redness.  She also feels as though the right breast is larger than the left, but understands that some of this can be related to her ongoing wound and inflammation.  Physical exam is entirely reassuring.  Breasts soft, relatively good symmetry.  Right breast does appear to be slightly larger than the left at this time.  No redness.  Wound is  5 x 2.75 x 0.5 cm.  Excellent underlying granular tissue.  The tunneling at the inferior lateral aspect of the wound has granulated up considerably, approximately 0.75 cm depth.  No drainage or slough noted.  Remainder of incisions appear to be healing well.  Discussed scar mitigation treatments to the incisions on left breast as well as silicone scar gel around right NAC.  Recommend continued wound management until she is seen by the wound care clinic tomorrow at which point the current dressing changes may be altered due to to her lack of drainage, perhaps transition to [facility] or collagen.  Will defer to them.  She has made excellent progress since my last encounter with her.  Anticipate continued advancing epithelialization /wound shrinkage.  Picture(s) obtained of the patient and placed in the chart were with the patient's or guardian's permission.

## 2022-01-11 ENCOUNTER — Encounter (HOSPITAL_BASED_OUTPATIENT_CLINIC_OR_DEPARTMENT_OTHER): Payer: 59 | Admitting: Internal Medicine

## 2022-01-11 DIAGNOSIS — T8141XS Infection following a procedure, superficial incisional surgical site, sequela: Secondary | ICD-10-CM | POA: Diagnosis not present

## 2022-01-11 DIAGNOSIS — T8131XA Disruption of external operation (surgical) wound, not elsewhere classified, initial encounter: Secondary | ICD-10-CM | POA: Diagnosis not present

## 2022-01-11 DIAGNOSIS — T8131XD Disruption of external operation (surgical) wound, not elsewhere classified, subsequent encounter: Secondary | ICD-10-CM

## 2022-01-11 DIAGNOSIS — S21001D Unspecified open wound of right breast, subsequent encounter: Secondary | ICD-10-CM

## 2022-01-11 NOTE — Progress Notes (Signed)
JAMIN, HUMPHRIES A ([id number]) [id number] Page 1 of 7 Visit Report for 01/11/2022 Chief Complaint Document Details Patient Name: Date of Service: [name], [name] 01/11/2022 10:00 A M Medical Record Number: [id number] Patient Account Number: [id number] Date of Birth/Sex: Treating RN: 1970-12-20 (51 y.o. F) Primary Care Provider: Penni Homans Other Clinician: Referring Provider: Treating Provider/Extender: Francoise Ceo in Treatment: 1 Information Obtained from: Patient Chief Complaint 12/30/2021; patient is here for review of a right breast wound Electronic Signature(s) Signed: 01/11/2022 2:06:49 PM By: Kalman Shan DO Entered By: Kalman Shan on 01/11/2022 12:37:01 -------------------------------------------------------------------------------- HPI Details Patient Name: Date of Service: Natalie Feil A. 01/11/2022 10:00 A M Medical Record Number: [id number] Patient Account Number: [id number] Date of Birth/Sex: Treating RN: 1971-01-29 (51 y.o. F) Primary Care Provider: Penni Homans Other Clinician: Referring Provider: Treating Provider/Extender: Grayling Congress Weeks in Treatment: 1 History of Present Illness HPI Description: ADMISSION 12/30/2021 This is a 51 year old woman who is here for review of a wound on her right breast. She had bilateral breast reduction surgery on 11/24/2021 by Dr. Marla Roe and since has been followed in our office. It is a bit difficult to determine exactly when the wound opened but the patient states it was very soon after surgery sees says that the area was Steri-Stripped but there was fluid leaking through and she has since developed an open wound. She has had this debrided and most recently cultured showing Pseudomonas and E. coli she was given a course of ciprofloxacin which she completed earlier this week. She had been using Vaseline gauze was switched to collagen. She  has not started this yet she has simply been using the Vaseline gauze. 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 She has not been systemically unwell. She has some pain in the left breast incision but has no open wound here. 11/14; patient presents for follow-up. She has been using gentamicin ointment with silver alginate to the wound bed. Into the tunneled area. She states the tunneled area has closed and she is noted no drainage. She completed her course of ciprofloxacin that was also prescribed at last clinic visit. She had a wound culture done that showed no significant bacterial growth. Electronic Signature(s) Signed: 01/11/2022 2:06:49 PM By: Kalman Shan DO Entered By: Kalman Shan on 01/11/2022 12:37:51 Laqueta Due A ([id number]) [id number] Page 2 of 7 -------------------------------------------------------------------------------- Physical Exam Details Patient Name: Date of Service: [name], Natalie A. 01/11/2022 10:00 A M Medical Record Number: [id number] Patient Account Number: [id number] Date of Birth/Sex: Treating RN: [birth year] (51 y.o. F) Primary Care Provider: Penni Homans Other Clinician: Referring Provider: Treating Provider/Extender: Grayling Congress Weeks in Treatment: 1 Constitutional respirations regular, non-labored and within target range for patient.. Cardiovascular 2+ dorsalis pedis/posterior tibialis pulses. Psychiatric pleasant and cooperative. Notes Right breast: Below the breast there is an open wound with granulation tissue throughout. Previously there was a tunnel however this was not present today. Palpation with no drainage or tenderness noted. No fluid collection appreciated. No increased warmth or erythema noted. Electronic Signature(s) Signed: 01/11/2022 2:06:49 PM By: Kalman Shan DO Entered By: Kalman Shan on 01/11/2022  12:40:02 -------------------------------------------------------------------------------- Physician Orders Details Patient Name: Date of Service: Natalie Feil A. 01/11/2022 10:00 A M Medical Record Number: [id number] Patient Account Number: [id number] Date of Birth/Sex: Treating RN: 04/22/1970 (51 y.o. [name], [name] Primary Care Provider: Penni Homans Other Clinician: Referring Provider: Treating Provider/Extender: Grayling Congress  Weeks in Treatment: 1 Verbal / Phone Orders: No Diagnosis Coding ICD-10 Coding Code Description T81.31XD Disruption of external operation (surgical) wound, not elsewhere classified, subsequent encounter S21.001D Unspecified open wound of right breast, subsequent encounter T81.41XS Infection following a procedure, superficial incisional surgical site, sequela Follow-up Appointments ppointment in 1 week. - Dr. Heber [place] Return A Anesthetic (In clinic) Topical Lidocaine 5% applied to wound bed Cellular or Tissue Based Products Cellular or Tissue Based Product Type: - wound center run insurance authorization for [place] or Epifix. Bathing/ Shower/ Hygiene May shower and wash wound with soap and water. Off-Loading Other: - try to keep pressure off of wound as best you can Wound Treatment [name], [name] ([id number]) [phone].pdf Page 3 of 7 Wound #1 - Breast Wound Laterality: Right Cleanser: Soap and Water 1 x Per Day/30 Days Discharge Instructions: May shower and wash wound with dial antibacterial soap and water prior to dressing change. Cleanser: Wound Cleanser 1 x Per Day/30 Days Discharge Instructions: Cleanse the wound with wound cleanser prior to applying a clean dressing using gauze sponges, not tissue or cotton balls. Topical: Gentamicin 1 x Per Day/30 Days Discharge Instructions: As directed by physician Prim Dressing: KerraCel Ag Gelling Fiber Dressing, 4x5 in (silver alginate) (Generic) 1 x Per  Day/30 Days ary Discharge Instructions: Apply silver alginate to wound bed as instructed Secondary Dressing: Zetuvit Plus 4x8 in (Generic) 1 x Per Day/30 Days Discharge Instructions: Apply over primary dressing as directed. Secured With: 27M Medipore H Soft Cloth Surgical T ape, 4 x 10 (in/yd) (Generic) 1 x Per Day/30 Days Discharge Instructions: Secure with tape as directed. Electronic Signature(s) Signed: 01/11/2022 2:06:49 PM By: Kalman Shan DO Entered By: Kalman Shan on 01/11/2022 12:40:12 -------------------------------------------------------------------------------- Problem List Details Patient Name: Date of Service: Natalie Feil A. 01/11/2022 10:00 A M Medical Record Number: [id number] Patient Account Number: [id number] Date of Birth/Sex: Treating RN: 09-12-1970 (51 y.o. [name], [name] Primary Care Provider: Penni Homans Other Clinician: Referring Provider: Treating Provider/Extender: Grayling Congress Weeks in Treatment: 1 Active Problems ICD-10 Encounter Code Description Active Date MDM Diagnosis T81.31XD Disruption of external operation (surgical) wound, not elsewhere classified, 12/30/2021 No Yes subsequent encounter S21.001D Unspecified open wound of right breast, subsequent encounter 12/30/2021 No Yes T81.41XS Infection following a procedure, superficial incisional surgical site, sequela 12/30/2021 No Yes Inactive Problems Resolved Problems Electronic Signature(s) Signed: 01/11/2022 2:06:49 PM By: Kalman Shan DO Entered By: Kalman Shan on 01/11/2022 12:36:46 Laqueta Due A ([id number]) [id number] Page 4 of 7 -------------------------------------------------------------------------------- Progress Note Details Patient Name: Date of Service: INSIYA, OSHEA 01/11/2022 10:00 A M Medical Record Number: [id number] Patient Account Number: [id number] Date of Birth/Sex: Treating RN: 1970/11/03 (51 y.o.  F) Primary Care Provider: Penni Homans Other Clinician: Referring Provider: Treating Provider/Extender: Francoise Ceo in Treatment: 1 Subjective Chief Complaint Information obtained from Patient 12/30/2021; patient is here for review of a right breast wound History of Present Illness (HPI) ADMISSION 12/30/2021 This is a 51 year old woman who is here for review of a wound on her right breast. She had bilateral breast reduction surgery on 11/24/2021 by Dr. Marla Roe and since has been followed in our office. It is a bit difficult to determine exactly when the wound opened but the patient states it was very soon after surgery sees says that the area was Steri-Stripped but there was fluid leaking through and she has since developed an open wound. She has had this debrided and most recently cultured showing Pseudomonas and E.  coli she was given a course of ciprofloxacin which she completed earlier this week. She had been using Vaseline gauze was switched to collagen. She has not started this yet she has simply been using the Vaseline gauze. 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 She has not been systemically unwell. She has some pain in the left breast incision but has no open wound here. 11/14; patient presents for follow-up. She has been using gentamicin ointment with silver alginate to the wound bed. Into the tunneled area. She states the tunneled area has closed and she is noted no drainage. She completed her course of ciprofloxacin that was also prescribed at last clinic visit. She had a wound culture done that showed no significant bacterial growth. Patient History Information obtained from Patient. Family History Cancer - Mother,Father,Maternal Grandparents, Heart Disease - Father,Mother, Hypertension - Mother,Father, No family history of Diabetes, Hereditary Spherocytosis, Kidney Disease, Lung Disease, Seizures, Stroke, Thyroid  Problems, Tuberculosis. Social History Never smoker, Marital Status - Married, Alcohol Use - Never, Drug Use - No History, Caffeine Use - Daily. Medical History Respiratory Patient has history of Sleep Apnea Cardiovascular Patient has history of Hypertension Endocrine Denies history of Type I Diabetes, Type II Diabetes Hospitalization/Surgery History - breast reduction. - bilateral knee replacements. - hysterectomy. - hernia repair. - appendectomy. Medical A Surgical History Notes nd Ear/Nose/Mouth/Throat TMJ Gastrointestinal celiac disease Oncologic skin cancer hx Objective Constitutional respirations regular, non-labored and within target range for patient.. Vitals Time Taken: 10:30 AM, Height: 64 in, Weight: 219 lbs, BMI: 37.6, Temperature: 98.3 F, Pulse: 88 bpm, Respiratory Rate: 18 breaths/min, Blood Pressure: 114/79 mmHg. LEIGHTYN, CINA A ([id number]) [id number] Page 5 of 7 Cardiovascular 2+ dorsalis pedis/posterior tibialis pulses. Psychiatric pleasant and cooperative. General Notes: Right breast: Below the breast there is an open wound with granulation tissue throughout. Previously there was a tunnel however this was not present today. Palpation with no drainage or tenderness noted. No fluid collection appreciated. No increased warmth or erythema noted. Integumentary (Hair, Skin) Wound #1 status is Open. Original cause of wound was Surgical Injury. The date acquired was: 12/13/2021. The wound has been in treatment 1 weeks. The wound is located on the Right Breast. The wound measures 2cm length x 4cm width x 0.3cm depth; 6.283cm^2 area and 1.885cm^3 volume. There is Fat Layer (Subcutaneous Tissue) exposed. There is no tunneling or undermining noted. There is a medium amount of serosanguineous drainage noted. The wound margin is distinct with the outline attached to the wound base. There is large (67-100%) red granulation within the wound bed.  There is a small (1-33%) amount of necrotic tissue within the wound bed including Adherent Slough. The periwound skin appearance had no abnormalities noted for moisture. The periwound skin appearance had no abnormalities noted for color. The periwound skin appearance exhibited: Scarring. Periwound temperature was noted as No Abnormality. Assessment Active Problems ICD-10 Disruption of external operation (surgical) wound, not elsewhere classified, subsequent encounter Unspecified open wound of right breast, subsequent encounter Infection following a procedure, superficial incisional surgical site, sequela Patient has shown improvement in size and appearance to the wound since last clinic visit. There was no tunnel appreciated today. At this time I recommended continuing with gentamicin ointment and silver alginate to the wound bed. She would benefit greatly from a skin substitute. We discussed this today and she would like to proceed with insurance verification. Follow-up in 1 week. Plan Follow-up Appointments: Return Appointment in 1 week. -  Dr. Heber [place] Anesthetic: (In clinic) Topical Lidocaine 5% applied to wound bed Cellular or Tissue Based Products: Cellular or Tissue Based Product Type: - wound center run insurance authorization for Epicord or Epifix. Bathing/ Shower/ Hygiene: May shower and wash wound with soap and water. Off-Loading: Other: - try to keep pressure off of wound as best you can WOUND #1: - Breast Wound Laterality: Right Cleanser: Soap and Water 1 x Per Day/30 Days Discharge Instructions: May shower and wash wound with dial antibacterial soap and water prior to dressing change. Cleanser: Wound Cleanser 1 x Per Day/30 Days Discharge Instructions: Cleanse the wound with wound cleanser prior to applying a clean dressing using gauze sponges, not tissue or cotton balls. Topical: Gentamicin 1 x Per Day/30 Days Discharge Instructions: As directed by physician Prim Dressing:  KerraCel Ag Gelling Fiber Dressing, 4x5 in (silver alginate) (Generic) 1 x Per Day/30 Days ary Discharge Instructions: Apply silver alginate to wound bed as instructed Secondary Dressing: Zetuvit Plus 4x8 in (Generic) 1 x Per Day/30 Days Discharge Instructions: Apply over primary dressing as directed. Secured With: 29M Medipore H Soft Cloth Surgical T ape, 4 x 10 (in/yd) (Generic) 1 x Per Day/30 Days Discharge Instructions: Secure with tape as directed. 1. Silver alginate with gentamicin ointment daily to the wound bed 2. Run IVR for EpiFix/epi cord 3. Follow-up in 1 week Electronic Signature(s) Signed: 01/11/2022 2:06:49 PM By: Kalman Shan DO Entered By: Kalman Shan on 01/11/2022 12:41:35 Laqueta Due A ([id number]) [id number] Page 6 of 7 -------------------------------------------------------------------------------- HxROS Details Patient Name: Date of Service: MIQUELA, COSTABILE 01/11/2022 10:00 A M Medical Record Number: [id number] Patient Account Number: [id number] Date of Birth/Sex: Treating RN: 21-Jul-1970 (51 y.o. F) Primary Care Provider: Penni Homans Other Clinician: Referring Provider: Treating Provider/Extender: Francoise Ceo in Treatment: 1 Information Obtained From Patient Ear/Nose/Mouth/Throat Medical History: Past Medical History Notes: TMJ Respiratory Medical History: Positive for: Sleep Apnea Cardiovascular Medical History: Positive for: Hypertension Gastrointestinal Medical History: Past Medical History Notes: celiac disease Endocrine Medical History: Negative for: Type I Diabetes; Type II Diabetes Oncologic Medical History: Past Medical History Notes: skin cancer hx Immunizations Pneumococcal Vaccine: Received Pneumococcal Vaccination: No Implantable Devices None Hospitalization / Surgery History Type of Hospitalization/Surgery breast reduction bilateral knee  replacements hysterectomy hernia repair appendectomy Family and Social History Cancer: Yes - Mother,Father,Maternal Grandparents; Diabetes: No; Heart Disease: Yes - Father,Mother; Hereditary Spherocytosis: No; Hypertension: Yes - Mother,Father; Kidney Disease: No; Lung Disease: No; Seizures: No; Stroke: No; Thyroid Problems: No; Tuberculosis: No; Never smoker; Marital Status - Married; Alcohol Use: Never; Drug Use: No History; Caffeine Use: Daily; Financial Concerns: No; Food, Clothing or Shelter Needs: No; Support System Lacking: No; Transportation Concerns: No Electronic Signature(s) Signed: 01/11/2022 2:06:49 PM By: Kalman Shan DO Entered By: Kalman Shan on 01/11/2022 12:37:59 Laqueta Due A ([id number]) [id number] Page 7 of 7 -------------------------------------------------------------------------------- SuperBill Details Patient Name: Date of Service: CARLYN, LEMKE 01/11/2022 Medical Record Number: [id number] Patient Account Number: [id number] Date of Birth/Sex: Treating RN: December 29, 1970 (51 y.o. [name], [name] Primary Care Provider: Penni Homans Other Clinician: Referring Provider: Treating Provider/Extender: Grayling Congress Weeks in Treatment: 1 Diagnosis Coding ICD-10 Codes Code Description T81.31XD Disruption of external operation (surgical) wound, not elsewhere classified, subsequent encounter S21.001D Unspecified open wound of right breast, subsequent encounter T81.41XS Infection following a procedure, superficial incisional surgical site, sequela Facility Procedures : CPT4 Code: [id number] Description: 99213 - WOUND CARE VISIT-LEV 3 EST PT Modifier: Quantity: 1 Physician Procedures : CPT4 Code  Description Modifier [id number] - WC PHYS LEVEL 3 - EST PT ICD-10 Diagnosis Description T81.31XD Disruption of external operation (surgical) wound, not elsewhere classified, subsequent encounter S21.001D Unspecified  open wound of right  breast, subsequent encounter T81.41XS Infection following a procedure, superficial incisional surgical site, sequela Quantity: 1 Electronic Signature(s) Signed: 01/11/2022 2:06:49 PM By: Kalman Shan DO Entered By: Kalman Shan on 01/11/2022 12:41:46

## 2022-01-12 ENCOUNTER — Ambulatory Visit (HOSPITAL_BASED_OUTPATIENT_CLINIC_OR_DEPARTMENT_OTHER): Payer: 59

## 2022-01-12 NOTE — Progress Notes (Signed)
Natalie Natalie, Natalie Natalie ([id number]) [id number] Page 1 of 9 Visit Report for 01/11/2022 Arrival Information Details Patient Name: Date of Service: Natalie Natalie 01/11/2022 10:00 Natalie Natalie Medical Record Number: [id number] Patient Account Number: [id number] Date of Birth/Sex: Treating RN: 04-05-1970 (51 y.o. F) Primary Care Victorious Kundinger: Penni Homans Other Clinician: Referring Allyiah Gartner: Treating Araseli Sherry/Extender: Francoise Ceo in Treatment: 1 Visit Information History Since Last Visit Added or deleted any medications: No Patient Arrived: Ambulatory Any new allergies or adverse reactions: No Arrival Time: 10:26 Had Natalie fall or experienced change in No Accompanied By: self activities of daily living that may affect Transfer Assistance: None risk of falls: Patient Identification Verified: Yes Signs or symptoms of abuse/neglect since last visito No Secondary Verification Process Completed: Yes Hospitalized since last visit: No Patient Requires Transmission-Based Precautions: No Implantable device outside of the clinic excluding No Patient Has Alerts: No cellular tissue based products placed in the center since last visit: Has Dressing in Place as Prescribed: Yes Pain Present Now: No Electronic Signature(s) Signed: 01/11/2022 3:56:34 PM By: Erenest Blank Entered By: Erenest Blank on 01/11/2022 10:29:07 -------------------------------------------------------------------------------- Clinic Level of Care Assessment Details Patient Name: Date of Service: Natalie Natalie, Natalie Natalie. 01/11/2022 10:00 Natalie Natalie Medical Record Number: [id number] Patient Account Number: [id number] Date of Birth/Sex: Treating RN: 1970/09/27 (51 y.o. Debby Bud Primary Care Wally Shevchenko: Penni Homans Other Clinician: Referring Larue Drawdy: Treating Darlisha Kelm/Extender: Francoise Ceo in Treatment: 1 Clinic Level of Care Assessment Items TOOL 4 Quantity  Score X- 1 0 Use when only an EandM is performed on FOLLOW-UP visit ASSESSMENTS - Nursing Assessment / Reassessment X- 1 10 Reassessment of Co-morbidities (includes updates in patient status) X- 1 5 Reassessment of Adherence to Treatment Plan ASSESSMENTS - Wound and Skin Natalie ssessment / Reassessment X - Simple Wound Assessment / Reassessment - one wound 1 5 '[]'$  - 0 Complex Wound Assessment / Reassessment - multiple wounds '[]'$  - 0 Dermatologic / Skin Assessment (not related to wound area) ASSESSMENTS - Focused Assessment '[]'$  - 0 Circumferential Edema Measurements - multi extremities '[]'$  - 0 Nutritional Assessment / Counseling / Intervention Natalie Natalie Natalie ([id number]) [id number] Page 2 of 9 '[]'$  - 0 Lower Extremity Assessment (monofilament, tuning fork, pulses) '[]'$  - 0 Peripheral Arterial Disease Assessment (using hand held doppler) ASSESSMENTS - Ostomy and/or Continence Assessment and Care '[]'$  - 0 Incontinence Assessment and Management '[]'$  - 0 Ostomy Care Assessment and Management (repouching, etc.) PROCESS - Coordination of Care X - Simple Patient / Family Education for ongoing care 1 15 '[]'$  - 0 Complex (extensive) Patient / Family Education for ongoing care X- 1 10 Staff obtains Programmer, systems, Records, T Results / Process Orders est '[]'$  - 0 Staff telephones HHA, Nursing Homes / Clarify orders / etc '[]'$  - 0 Routine Transfer to another Facility (non-emergent condition) '[]'$  - 0 Routine Hospital Admission (non-emergent condition) '[]'$  - 0 New Admissions / Biomedical engineer / Ordering NPWT Apligraf, etc. , '[]'$  - 0 Emergency Hospital Admission (emergent condition) X- 1 10 Simple Discharge Coordination '[]'$  - 0 Complex (extensive) Discharge Coordination PROCESS - Special Needs '[]'$  - 0 Pediatric / Minor Patient Management '[]'$  - 0 Isolation Patient Management '[]'$  - 0 Hearing / Language / Visual special needs '[]'$  - 0 Assessment of Community assistance  (transportation, D/C planning, etc.) '[]'$  - 0 Additional assistance / Altered mentation '[]'$  - 0 Support Surface(s) Assessment (bed, cushion, seat, etc.) INTERVENTIONS - Wound Cleansing / Measurement X - Simple Wound Cleansing - one  wound 1 5 '[]'$  - 0 Complex Wound Cleansing - multiple wounds X- 1 5 Wound Imaging (photographs - any number of wounds) '[]'$  - 0 Wound Tracing (instead of photographs) X- 1 5 Simple Wound Measurement - one wound '[]'$  - 0 Complex Wound Measurement - multiple wounds INTERVENTIONS - Wound Dressings X - Small Wound Dressing one or multiple wounds 1 10 '[]'$  - 0 Medium Wound Dressing one or multiple wounds '[]'$  - 0 Large Wound Dressing one or multiple wounds '[]'$  - 0 Application of Medications - topical '[]'$  - 0 Application of Medications - injection INTERVENTIONS - Miscellaneous '[]'$  - 0 External ear exam '[]'$  - 0 Specimen Collection (cultures, biopsies, blood, body fluids, etc.) '[]'$  - 0 Specimen(s) / Culture(s) sent or taken to Lab for analysis '[]'$  - 0 Patient Transfer (multiple staff / Civil Service fast streamer / Similar devices) '[]'$  - 0 Simple Staple / Suture removal (25 or less) '[]'$  - 0 Complex Staple / Suture removal (26 or more) '[]'$  - 0 Hypo / Hyperglycemic Management (close monitor of Blood Glucose) Natalie Natalie, Natalie Natalie ([id number]) [id number] Page 3 of 9 '[]'$  - 0 Ankle / Brachial Index (ABI) - do not check if billed separately X- 1 5 Vital Signs Has the patient been seen at the hospital within the last three years: Yes Total Score: 85 Level Of Care: New/Established - Level 3 Electronic Signature(s) Signed: 01/11/2022 5:53:09 PM By: Natalie Pilling RN, BSN Entered By: Natalie Natalie on 01/11/2022 11:06:02 -------------------------------------------------------------------------------- Encounter Discharge Information Details Patient Name: Date of Service: Natalie Natalie. 01/11/2022 10:00 Natalie Natalie Medical Record Number: [id number] Patient Account Number:  [id number] Date of Birth/Sex: Treating RN: 11/23/1970 (51 y.o. Debby Bud Primary Care Luree Palla: Penni Homans Other Clinician: Referring Tacoya Altizer: Treating Bettie Capistran/Extender: Francoise Ceo in Treatment: 1 Encounter Discharge Information Items Discharge Condition: Stable Ambulatory Status: Ambulatory Discharge Destination: Home Transportation: Private Auto Accompanied By: self Schedule Follow-up Appointment: Yes Clinical Summary of Care: Electronic Signature(s) Signed: 01/11/2022 5:53:09 PM By: Natalie Pilling RN, BSN Entered By: Natalie Natalie on 01/11/2022 11:06:17 -------------------------------------------------------------------------------- Lower Extremity Assessment Details Patient Name: Date of Service: Natalie Natalie. 01/11/2022 10:00 Natalie Natalie Medical Record Number: [id number] Patient Account Number: [id number] Date of Birth/Sex: Treating RN: 11/07/1970 (51 y.o. F) Primary Care Tahjay Binion: Penni Homans Other Clinician: Referring China Deitrick: Treating Stepfanie Yott/Extender: Grayling Congress Weeks in Treatment: 1 Electronic Signature(s) Signed: 01/11/2022 3:56:34 PM By: Erenest Blank Entered By: Erenest Blank on 01/11/2022 10:30:37 -------------------------------------------------------------------------------- Multi Wound Chart Details Patient Name: Date of Service: Natalie Natalie. 01/11/2022 10:00 Natalie Natalie Medical Record Number: [id number] Patient Account Number: [id number] Natalie Natalie, Natalie Natalie ([id number]) [id number] Page 4 of 9 Date of Birth/Sex: Treating RN: [birth year] (51 y.o. F) Primary Care Alvon Nygaard: Other Clinician: Penni Homans Referring Azaylah Stailey: Treating Cleda Imel/Extender: Francoise Ceo in Treatment: 1 Vital Signs Height(in): 64 Pulse(bpm): 88 Weight(lbs): 219 Blood Pressure(mmHg): 114/79 Body Mass Index(BMI): 37.6 Temperature(F): 98.3 Respiratory Rate(breaths/min):  18 [1:Photos:] [N/Natalie:N/Natalie] Right Breast N/Natalie N/Natalie Wound Location: Surgical Injury N/Natalie N/Natalie Wounding Event: Open Surgical Wound N/Natalie N/Natalie Primary Etiology: Sleep Apnea, Hypertension N/Natalie N/Natalie Comorbid History: 12/13/2021 N/Natalie N/Natalie Date Acquired: 1 N/Natalie N/Natalie Weeks of Treatment: Open N/Natalie N/Natalie Wound Status: No N/Natalie N/Natalie Wound Recurrence: 2x4x0.3 N/Natalie N/Natalie Measurements L x W x D (cm) 6.283 N/Natalie N/Natalie Natalie (cm) : rea 1.885 N/Natalie N/Natalie Volume (cm) : 46.80% N/Natalie N/Natalie % Reduction in Natalie rea: 46.80% N/Natalie N/Natalie % Reduction in Volume: Full Thickness Without Exposed N/Natalie N/Natalie  Classification: Support Structures Medium N/Natalie N/Natalie Exudate Amount: Serosanguineous N/Natalie N/Natalie Exudate Type: red, brown N/Natalie N/Natalie Exudate Color: Distinct, outline attached N/Natalie N/Natalie Wound Margin: Large (67-100%) N/Natalie N/Natalie Granulation Amount: Red N/Natalie N/Natalie Granulation Quality: Small (1-33%) N/Natalie N/Natalie Necrotic Amount: Fat Layer (Subcutaneous Tissue): Yes N/Natalie N/Natalie Exposed Structures: Fascia: No Tendon: No Muscle: No Joint: No Bone: No None N/Natalie N/Natalie Epithelialization: Scarring: Yes N/Natalie N/Natalie Periwound Skin Texture: No Abnormalities Noted N/Natalie N/Natalie Periwound Skin Moisture: No Abnormalities Noted N/Natalie N/Natalie Periwound Skin Color: No Abnormality N/Natalie N/Natalie Temperature: Treatment Notes Wound #1 (Breast) Wound Laterality: Right Cleanser Soap and Water Discharge Instruction: May shower and wash wound with dial antibacterial soap and water prior to dressing change. Wound Cleanser Discharge Instruction: Cleanse the wound with wound cleanser prior to applying Natalie clean dressing using gauze sponges, not tissue or cotton balls. Peri-Wound Care Topical Gentamicin Discharge Instruction: As directed by physician Primary Dressing KerraCel Ag Gelling Fiber Dressing, 4x5 in (silver alginate) Discharge Instruction: Apply silver alginate to wound bed as instructed Secondary Dressing Natalie Natalie, Natalie Natalie ([id number]) [id number] Page 5 of  9 Zetuvit Plus 4x8 in Discharge Instruction: Apply over primary dressing as directed. Secured With 76M Medipore H Soft Cloth Surgical T ape, 4 x 10 (in/yd) Discharge Instruction: Secure with tape as directed. Compression Wrap Compression Stockings Add-Ons Electronic Signature(s) Signed: 01/11/2022 2:06:49 PM By: Kalman Shan DO Entered By: Kalman Shan on 01/11/2022 12:36:53 -------------------------------------------------------------------------------- Multi-Disciplinary Care Plan Details Patient Name: Date of Service: Natalie Natalie. 01/11/2022 10:00 Natalie Natalie Medical Record Number: [id number] Patient Account Number: [id number] Date of Birth/Sex: Treating RN: 04-Jul-1970 (51 y.o. Natalie Natalie, Natalie Natalie Primary Care Allicia Culley: Penni Homans Other Clinician: Referring Milanni Ayub: Treating Yani Lal/Extender: Grayling Congress Weeks in Treatment: 1 Active Inactive Necrotic Tissue Nursing Diagnoses: Impaired tissue integrity related to necrotic/devitalized tissue Knowledge deficit related to management of necrotic/devitalized tissue Goals: Necrotic/devitalized tissue will be minimized in the wound bed Date Initiated: 12/30/2021 Target Resolution Date: 03/04/2022 Goal Status: Active Patient/caregiver will verbalize understanding of reason and process for debridement of necrotic tissue Date Initiated: 12/30/2021 Target Resolution Date: 03/04/2022 Goal Status: Active Interventions: Assess patient pain level pre-, during and post procedure and prior to discharge Provide education on necrotic tissue and debridement process Treatment Activities: Apply topical anesthetic as ordered : 12/30/2021 Notes: Wound/Skin Impairment Nursing Diagnoses: Impaired tissue integrity Knowledge deficit related to ulceration/compromised skin integrity Goals: Patient/caregiver will verbalize understanding of skin care regimen Date Initiated: 12/30/2021 Target Resolution Date: 03/04/2022 Goal  Status: Active Interventions: Assess ulceration(s) every visit Treatment Activities: Natalie Natalie, Natalie Natalie ([id number]) [id number] Page 6 of 9 Skin care regimen initiated : 12/30/2021 Topical wound management initiated : 12/30/2021 Notes: Electronic Signature(s) Signed: 01/11/2022 5:53:09 PM By: Natalie Pilling RN, BSN Entered By: Natalie Natalie on 01/11/2022 10:12:53 -------------------------------------------------------------------------------- Pain Assessment Details Patient Name: Date of Service: Natalie Natalie. 01/11/2022 10:00 Natalie Natalie Medical Record Number: [id number] Patient Account Number: [id number] Date of Birth/Sex: Treating RN: 08-30-1970 (51 y.o. F) Primary Care Ardena Gangl: Penni Homans Other Clinician: Referring Tricha Ruggirello: Treating Kalman Nylen/Extender: Grayling Congress Weeks in Treatment: 1 Active Problems Location of Pain Severity and Description of Pain Patient Has Paino No Site Locations Pain Management and Medication Current Pain Management: Electronic Signature(s) Signed: 01/11/2022 3:56:34 PM By: Erenest Blank Entered By: Erenest Blank on 01/11/2022 10:30:31 -------------------------------------------------------------------------------- Patient/Caregiver Education Details Patient Name: Date of Service: Eustaquio Boyden 11/14/2023andnbsp10:00 [place] Record Number: [id number] Patient Account Number: [id number] Date of Birth/Gender: Treating RN: [birth year] (  51 y.o. Debby Bud Primary Care Physician: Penni Homans Other Clinician: Referring Physician: Treating Physician/Extender: Francoise Ceo in Treatment: 1 Natalie Natalie, Natalie Natalie ([id number]) [id number] Page 7 of 9 Education Assessment Education Provided To: Patient Education Topics Provided Wound Debridement: Handouts: Wound Debridement Methods: Explain/Verbal Responses: Reinforcements needed Electronic  Signature(s) Signed: 01/11/2022 5:53:09 PM By: Natalie Pilling RN, BSN Entered By: Natalie Natalie on 01/11/2022 10:13:04 -------------------------------------------------------------------------------- Wound Assessment Details Patient Name: Date of Service: Natalie Natalie. 01/11/2022 10:00 Natalie Natalie Medical Record Number: [id number] Patient Account Number: [id number] Date of Birth/Sex: Treating RN: [birth year] (51 y.o. F) Primary Care Special Ranes: Penni Homans Other Clinician: Referring Rosangela Fehrenbach: Treating Rehema Muffley/Extender: Grayling Congress Weeks in Treatment: 1 Wound Status Wound Number: 1 Primary Etiology: Open Surgical Wound Wound Location: Right Breast Wound Status: Open Wounding Event: Surgical Injury Comorbid History: Sleep Apnea, Hypertension Date Acquired: 12/13/2021 Weeks Of Treatment: 1 Clustered Wound: No Photos Wound Measurements Length: (cm) 2 Width: (cm) 4 Depth: (cm) 0.3 Area: (cm) 6.283 Volume: (cm) 1.885 % Reduction in Area: 46.8% % Reduction in Volume: 46.8% Epithelialization: None Tunneling: No Undermining: No Wound Description Classification: Full Thickness Without Exposed Support Wound Margin: Distinct, outline attached Exudate Amount: Medium Exudate Type: Serosanguineous Exudate Color: red, brown Structures Foul Odor After Cleansing: No Slough/Fibrino Yes Wound Bed Granulation Amount: Large (67-100%) Exposed Structure Natalie Natalie, Natalie Natalie ([id number]) [id number] Page 8 of 9 Granulation Quality: Red Fascia Exposed: No Necrotic Amount: Small (1-33%) Fat Layer (Subcutaneous Tissue) Exposed: Yes Necrotic Quality: Adherent Slough Tendon Exposed: No Muscle Exposed: No Joint Exposed: No Bone Exposed: No Periwound Skin Texture Texture Color No Abnormalities Noted: No No Abnormalities Noted: Yes Scarring: Yes Temperature / Pain Temperature: No Abnormality Moisture No Abnormalities Noted: Yes Treatment  Notes Wound #1 (Breast) Wound Laterality: Right Cleanser Soap and Water Discharge Instruction: May shower and wash wound with dial antibacterial soap and water prior to dressing change. Wound Cleanser Discharge Instruction: Cleanse the wound with wound cleanser prior to applying Natalie clean dressing using gauze sponges, not tissue or cotton balls. Peri-Wound Care Topical Gentamicin Discharge Instruction: As directed by physician Primary Dressing KerraCel Ag Gelling Fiber Dressing, 4x5 in (silver alginate) Discharge Instruction: Apply silver alginate to wound bed as instructed Secondary Dressing Zetuvit Plus 4x8 in Discharge Instruction: Apply over primary dressing as directed. Secured With 67M Medipore H Soft Cloth Surgical T ape, 4 x 10 (in/yd) Discharge Instruction: Secure with tape as directed. Compression Wrap Compression Stockings Add-Ons Electronic Signature(s) Signed: 01/11/2022 3:56:34 PM By: Erenest Blank Entered By: Erenest Blank on 01/11/2022 10:37:29 -------------------------------------------------------------------------------- Vitals Details Patient Name: Date of Service: Natalie Natalie. 01/11/2022 10:00 Natalie Natalie Medical Record Number: [id number] Patient Account Number: [id number] Date of Birth/Sex: Treating RN: 30-Jun-1970 (51 y.o. F) Primary Care Eilidh Marcano: Penni Homans Other Clinician: Referring Piercen Covino: Treating Dhruvi Crenshaw/Extender: Grayling Congress Weeks in Treatment: 1 Vital Signs Time Taken: 10:30 Temperature (F): 98.3 Height (in): 64 Pulse (bpm): 88 Weight (lbs): 219 Respiratory Rate (breaths/min): 18 Body Mass Index (BMI): 37.6 Blood Pressure (mmHg): 114/79 MISHAL, PROBERT Natalie ([id number]) [id number] Page 9 of 9 Reference Range: 80 - 120 mg / dl Electronic Signature(s) Signed: 01/11/2022 3:56:34 PM By: Erenest Blank Entered By: Erenest Blank on 01/11/2022 10:30:25

## 2022-01-18 ENCOUNTER — Encounter (HOSPITAL_BASED_OUTPATIENT_CLINIC_OR_DEPARTMENT_OTHER): Payer: 59 | Admitting: Internal Medicine

## 2022-01-18 DIAGNOSIS — T8131XD Disruption of external operation (surgical) wound, not elsewhere classified, subsequent encounter: Secondary | ICD-10-CM | POA: Diagnosis not present

## 2022-01-18 DIAGNOSIS — S21001D Unspecified open wound of right breast, subsequent encounter: Secondary | ICD-10-CM

## 2022-01-18 DIAGNOSIS — T8141XS Infection following a procedure, superficial incisional surgical site, sequela: Secondary | ICD-10-CM

## 2022-01-18 DIAGNOSIS — T8131XA Disruption of external operation (surgical) wound, not elsewhere classified, initial encounter: Secondary | ICD-10-CM | POA: Diagnosis not present

## 2022-01-18 NOTE — Progress Notes (Signed)
Natalie [name] ([id number]) [id number] Page 1 of 7 Visit Report for 01/18/2022 Chief Complaint Document Details Patient Name: Date of Service: Natalie [name], Natalie [name] 01/18/2022 10:15 Natalie [name] Medical Record Number: [id number] Patient Account Number: [id number] Date of Birth/Sex: Treating RN: 1971/02/05 (51 y.o. F) Primary Care Provider: Penni Homans Other Clinician: Referring Provider: Treating Provider/Extender: Natalie [name] in Treatment: 2 Information Obtained from: Patient Chief Complaint 12/30/2021; patient is here for review of [name] right breast wound Electronic Signature(s) Signed: 01/18/2022 12:46:27 PM By: Kalman Shan DO Entered By: Kalman Shan on 01/18/2022 11:22:52 -------------------------------------------------------------------------------- HPI Details Patient Name: Date of Service: Natalie [name]. 01/18/2022 10:15 Natalie [name] Medical Record Number: [id number] Patient Account Number: [id number] Date of Birth/Sex: Treating RN: [birth year] (51 y.o. F) Primary Care Provider: Penni Homans Other Clinician: Referring Provider: Treating Provider/Extender: Natalie [name] in Treatment: 2 History of Present Illness HPI Description: ADMISSION 12/30/2021 This is [name] who is here for review of [name] wound on her right breast. She had bilateral breast reduction surgery on 11/24/2021 by Dr. Marla Roe and since has been followed in our office. It is [name] bit difficult to determine exactly when the wound opened but the patient states it was very soon after surgery sees says that the area was Steri-Stripped but there was fluid leaking through and she has since developed an open wound. She has had this debrided and most recently cultured showing Pseudomonas and E. coli she was given [name] course of ciprofloxacin which she completed earlier this week. She had been using Vaseline gauze was switched to collagen. She  has not started this yet she has simply been using the Vaseline gauze. 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 She has not been systemically unwell. She has some pain in the left breast incision but has no open wound here. 11/14; patient presents for follow-up. She has been using gentamicin ointment with silver alginate to the wound bed. Into the tunneled area. She states the tunneled area has closed and she is noted no drainage. She completed her course of ciprofloxacin that was also prescribed at last clinic visit. She had [name] wound culture done that showed no significant bacterial growth. 11/21; patient presents for follow-up. She has been using gentamicin ointment with silver alginate to the wound bed. She has no issues or complaints today. She reports minimal drainage. She denies signs of infection. Electronic Signature(s) Signed: 01/18/2022 12:46:27 PM By: Kalman Shan DO Entered By: Kalman Shan on 01/18/2022 11:23:20 Natalie [name] [id number] Page 2 of 7 -------------------------------------------------------------------------------- Physical Exam Details Patient Name: Date of Service: Natalie [name] 01/18/2022 10:15 Natalie [name] Medical Record Number: [id number] Patient Account Number: [id number] Date of Birth/Sex: Treating RN: 1970-03-05 (51 y.o. F) Primary Care Provider: Penni Homans Other Clinician: Referring Provider: Treating Provider/Extender: Grayling Congress Weeks in Treatment: 2 Constitutional respirations regular, non-labored and within target range for patient.[name] Psychiatric pleasant and cooperative. Notes Right breast: Below the breast there is an open wound with granulation tissue throughout. Previous tunnel is still closed. Palpation with no drainage or tenderness noted. No fluid collection appreciated. No increased warmth or erythema noted. Electronic  Signature(s) Signed: 01/18/2022 12:46:27 PM By: Kalman Shan DO Entered By: Kalman Shan on 01/18/2022 11:24:01 -------------------------------------------------------------------------------- Physician Orders Details Patient Name: Date of Service: Natalie [name]. 01/18/2022 10:15 Natalie [name] Medical Record Number: [id number] Patient Account Number: [id number] Date of Birth/Sex: Treating  RN: 04-18-1970 (51 y.o. [name], [name] Primary Care Provider: Penni Homans Other Clinician: Referring Provider: Treating Provider/Extender: Natalie [name] in Treatment: 2 Verbal / Phone Orders: No Diagnosis Coding ICD-10 Coding Code Description T81.31XD Disruption of external operation (surgical) wound, not elsewhere classified, subsequent encounter S21.001D Unspecified open wound of right breast, subsequent encounter T81.41XS Infection following [name] procedure, superficial incisional surgical site, sequela Follow-up Appointments ppointment in 1 week. - Dr. Heber [place] Return [name] Anesthetic (In clinic) Topical Lidocaine 5% applied to wound bed Cellular or Tissue Based Products Cellular or Tissue Based Product Type: - wound center run insurance authorization for Epicord or organogenesis- denied. Bathing/ Shower/ Hygiene May shower and wash wound with soap and water. Off-Loading Other: - try to keep pressure off of wound as best you can Wound Treatment Wound #1 - Breast Wound Laterality: Right Cleanser: Soap and Water 1 x Per Day/30 Days Discharge Instructions: May shower and wash wound with dial antibacterial soap and water prior to dressing change. Natalie [name], Natalie [name] [id number] Page 3 of 7 Cleanser: Wound Cleanser 1 x Per Day/30 Days Discharge Instructions: Cleanse the wound with wound cleanser prior to applying [name] clean dressing using gauze sponges, not tissue or cotton balls. Prim Dressing: Fibracol Plus Dressing, 4x4.38 in (collagen) 1  x Per Day/30 Days ary Discharge Instructions: Moisten collagen with [place] Jelly. Secondary Dressing: Zetuvit Plus Silicone Border Dressing 4x4 (in/in) 1 x Per Day/30 Days Discharge Instructions: Apply silicone border over primary dressing as directed. Electronic Signature(s) Signed: 01/18/2022 12:46:27 PM By: Kalman Shan DO Entered By: Kalman Shan on 01/18/2022 11:24:12 -------------------------------------------------------------------------------- Problem List Details Patient Name: Date of Service: Natalie [name]. 01/18/2022 10:15 Natalie [name] Medical Record Number: [id number] Patient Account Number: [id number] Date of Birth/Sex: Treating RN: 05-05-1970 (51 y.o. [name], [name] Primary Care Provider: Penni Homans Other Clinician: Referring Provider: Treating Provider/Extender: Grayling Congress Weeks in Treatment: 2 Active Problems ICD-10 Encounter Code Description Active Date MDM Diagnosis T81.31XD Disruption of external operation (surgical) wound, not elsewhere classified, 12/30/2021 No Yes subsequent encounter S21.001D Unspecified open wound of right breast, subsequent encounter 12/30/2021 No Yes T81.41XS Infection following [name] procedure, superficial incisional surgical site, sequela 12/30/2021 No Yes Inactive Problems Resolved Problems Electronic Signature(s) Signed: 01/18/2022 12:46:27 PM By: Kalman Shan DO Entered By: Kalman Shan on 01/18/2022 11:22:30 -------------------------------------------------------------------------------- Progress Note Details Patient Name: Date of Service: Natalie [name]. 01/18/2022 10:15 Natalie [name] Medical Record Number: [id number] Patient Account Number: [id number] Date of Birth/Sex: Treating RN: 11-11-1970 (51 y.o. F) Primary Care Provider: Penni Homans Other Clinician: [name] ([id number]) [id number] Page 4 of 7 Referring Provider: Treating Provider/Extender: Natalie [name] in Treatment: 2 Subjective Chief Complaint Information obtained from Patient 12/30/2021; patient is here for review of [name] right breast wound History of Present Illness (HPI) ADMISSION 12/30/2021 This is [name] who is here for review of [name] wound on her right breast. She had bilateral breast reduction surgery on 11/24/2021 by Dr. Marla Roe and since has been followed in our office. It is [name] bit difficult to determine exactly when the wound opened but the patient states it was very soon after surgery sees says that the area was Steri-Stripped but there was fluid leaking through and she has since developed an open wound. She has had this debrided and most recently cultured showing Pseudomonas and E. coli she was given [name] course of ciprofloxacin which she completed earlier this week. She had been using Vaseline gauze  was switched to collagen. She has not started this yet she has simply been using the Vaseline gauze. 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 She has not been systemically unwell. She has some pain in the left breast incision but has no open wound here. 11/14; patient presents for follow-up. She has been using gentamicin ointment with silver alginate to the wound bed. Into the tunneled area. She states the tunneled area has closed and she is noted no drainage. She completed her course of ciprofloxacin that was also prescribed at last clinic visit. She had [name] wound culture done that showed no significant bacterial growth. 11/21; patient presents for follow-up. She has been using gentamicin ointment with silver alginate to the wound bed. She has no issues or complaints today. She reports minimal drainage. She denies signs of infection. Patient History Information obtained from Patient. Family History Cancer - Mother,Father,Maternal Grandparents, Heart Disease - Father,Mother, Hypertension - Mother,Father, No family  history of Diabetes, Hereditary Spherocytosis, Kidney Disease, Lung Disease, Seizures, Stroke, Thyroid Problems, Tuberculosis. Social History Never smoker, Marital Status - Married, Alcohol Use - Never, Drug Use - No History, Caffeine Use - Daily. Medical History Respiratory Patient has history of Sleep Apnea Cardiovascular Patient has history of Hypertension Endocrine Denies history of Type I Diabetes, Type II Diabetes Hospitalization/Surgery History - breast reduction. - bilateral knee replacements. - hysterectomy. - hernia repair. - appendectomy. Medical [name] Surgical History Notes nd Ear/Nose/Mouth/Throat TMJ Gastrointestinal celiac disease Oncologic skin cancer hx Objective Constitutional respirations regular, non-labored and within target range for patient.. Vitals Time Taken: 10:00 AM, Height: 64 in, Weight: 219 lbs, BMI: 37.6, Respiratory Rate: 17 breaths/min. Psychiatric pleasant and cooperative. General Notes: Right breast: Below the breast there is an open wound with granulation tissue throughout. Previous tunnel is still closed. Palpation with no drainage or tenderness noted. No fluid collection appreciated. No increased warmth or erythema noted. Integumentary (Hair, Skin) Wound #1 status is Open. Original cause of wound was Surgical Injury. The date acquired was: 12/13/2021. The wound has been in treatment 2 weeks. The wound is located on the Right Breast. The wound measures 2.4cm length x 3.5cm width x 0.1cm depth; 6.597cm^2 area and 0.66cm^3 volume. There is Fat Layer (Subcutaneous Tissue) exposed. There is no tunneling or undermining noted. There is [name] medium amount of serosanguineous drainage noted. The wound margin is distinct with the outline attached to the wound base. There is large (67-100%) red, friable granulation within the wound bed. There is [name] small (1-33%) Natalie [name], Natalie [name] ([id number]) [id number] Page 5 of 7 amount of necrotic  tissue within the wound bed including Adherent Slough. The periwound skin appearance had no abnormalities noted for moisture. The periwound skin appearance had no abnormalities noted for color. The periwound skin appearance exhibited: Scarring. Periwound temperature was noted as No Abnormality. Assessment Active Problems ICD-10 Disruption of external operation (surgical) wound, not elsewhere classified, subsequent encounter Unspecified open wound of right breast, subsequent encounter Infection following [name] procedure, superficial incisional surgical site, sequela Patient's wound has shown improvement in size and appearance since last clinic visit. At this time I recommended switching the dressing to collagen As she has little drainage and has healthy granulation tissue throughout the wound bed. Insurance did not approve skin substitute. Follow-up in 1 week. Plan Follow-up Appointments: Return Appointment in 1 week. - Dr. Heber [place] Anesthetic: (In clinic) Topical Lidocaine 5% applied to wound bed Cellular or Tissue Based Products: Cellular or Tissue Based  Product Type: - wound center run insurance authorization for Epicord or organogenesis- denied. Bathing/ Shower/ Hygiene: May shower and wash wound with soap and water. Off-Loading: Other: - try to keep pressure off of wound as best you can WOUND #1: - Breast Wound Laterality: Right Cleanser: Soap and Water 1 x Per Day/30 Days Discharge Instructions: May shower and wash wound with dial antibacterial soap and water prior to dressing change. Cleanser: Wound Cleanser 1 x Per Day/30 Days Discharge Instructions: Cleanse the wound with wound cleanser prior to applying [name] clean dressing using gauze sponges, not tissue or cotton balls. Prim Dressing: Fibracol Plus Dressing, 4x4.38 in (collagen) 1 x Per Day/30 Days ary Discharge Instructions: Moisten collagen with [place] Jelly. Secondary Dressing: Zetuvit Plus Silicone Border Dressing 4x4 (in/in) 1 x Per  Day/30 Days Discharge Instructions: Apply silicone border over primary dressing as directed. 1. Collagen with surgical lubricant 2. Follow-up in 1 week Electronic Signature(s) Signed: 01/18/2022 12:46:27 PM By: Kalman Shan DO Entered By: Kalman Shan on 01/18/2022 11:25:42 -------------------------------------------------------------------------------- HxROS Details Patient Name: Date of Service: Natalie [name]. 01/18/2022 10:15 Natalie [name] Medical Record Number: [id number] Patient Account Number: [id number] Date of Birth/Sex: Treating RN: December 24, 1970 (51 y.o. F) Primary Care Provider: Penni Homans Other Clinician: Referring Provider: Treating Provider/Extender: Natalie [name] in Treatment: 2 Information Obtained From Patient Ear/Nose/Mouth/Throat Medical History: Past Medical History Notes: TMJ Natalie [name], Natalie [name] ([id number]) [id number] Page 6 of 7 Respiratory Medical History: Positive for: Sleep Apnea Cardiovascular Medical History: Positive for: Hypertension Gastrointestinal Medical History: Past Medical History Notes: celiac disease Endocrine Medical History: Negative for: Type I Diabetes; Type II Diabetes Oncologic Medical History: Past Medical History Notes: skin cancer hx Immunizations Pneumococcal Vaccine: Received Pneumococcal Vaccination: No Implantable Devices None Hospitalization / Surgery History Type of Hospitalization/Surgery breast reduction bilateral knee replacements hysterectomy hernia repair appendectomy Family and Social History Cancer: Yes - Mother,Father,Maternal Grandparents; Diabetes: No; Heart Disease: Yes - Father,Mother; Hereditary Spherocytosis: No; Hypertension: Yes - Mother,Father; Kidney Disease: No; Lung Disease: No; Seizures: No; Stroke: No; Thyroid Problems: No; Tuberculosis: No; Never smoker; Marital Status - Married; Alcohol Use: Never; Drug Use: No History; Caffeine Use:  Daily; Financial Concerns: No; Food, Clothing or Shelter Needs: No; Support System Lacking: No; Transportation Concerns: No Electronic Signature(s) Signed: 01/18/2022 12:46:27 PM By: Kalman Shan DO Entered By: Kalman Shan on 01/18/2022 11:23:24 -------------------------------------------------------------------------------- SuperBill Details Patient Name: Date of Service: Natalie [name]. 01/18/2022 Medical Record Number: [id number] Patient Account Number: [id number] Date of Birth/Sex: Treating RN: [birth year] (52 y.o. Natalie [name] Primary Care Provider: Penni Homans Other Clinician: Referring Provider: Treating Provider/Extender: Grayling Congress Weeks in Treatment: 2 Diagnosis Coding ICD-10 Codes Code Description Natalie [name], Natalie [name] ([id number]) [id number] Page 7 of 7 T81.31XD Disruption of external operation (surgical) wound, not elsewhere classified, subsequent encounter S21.001D Unspecified open wound of right breast, subsequent encounter T81.41XS Infection following [name] procedure, superficial incisional surgical site, sequela Facility Procedures : CPT4 Code: [id number] Description: 99213 - WOUND CARE VISIT-LEV 3 EST PT Modifier: Quantity: 1 Physician Procedures : CPT4 Code Description Modifier [id number] 99213 - WC PHYS LEVEL 3 - EST PT ICD-10 Diagnosis Description T81.31XD Disruption of external operation (surgical) wound, not elsewhere classified, subsequent encounter S21.001D Unspecified open wound of right  breast, subsequent encounter T81.41XS Infection following [name] procedure, superficial incisional surgical site, sequela Quantity: 1 Electronic Signature(s) Signed: 01/18/2022 12:46:27 PM By: Kalman Shan DO Entered By: Kalman Shan on 01/18/2022 11:25:53

## 2022-01-19 NOTE — Progress Notes (Signed)
Natalie Natalie, Natalie Natalie ([id number]) [id number] Page 1 of 8 Visit Report for 01/18/2022 Arrival Information Details Patient Name: Date of Service: Natalie Natalie 01/18/2022 10:15 Natalie Natalie Medical Record Number: [id number] Patient Account Number: [id number] Date of Birth/Sex: Treating RN: 09-12-1970 (51 y.o. Natalie Natalie, [name] Primary Care Natalie Natalie: Penni Homans Other Clinician: Referring Natalie Natalie: Treating Natalie Natalie: Natalie Natalie in Treatment: 2 Visit Information History Since Last Visit Added or deleted any medications: No Patient Arrived: Ambulatory Any new allergies or adverse reactions: No Arrival Time: 09:59 Had Natalie fall or experienced change in No Accompanied By: self activities of daily living that may affect Transfer Assistance: None risk of falls: Patient Identification Verified: Yes Signs or symptoms of abuse/neglect since last visito No Secondary Verification Process Completed: Yes Hospitalized since last visit: No Patient Requires Transmission-Based Precautions: No Implantable device outside of the clinic excluding No Patient Has Alerts: No cellular tissue based products placed in the center since last visit: Has Dressing in Place as Prescribed: Yes Pain Present Now: No Electronic Signature(s) Signed: 01/18/2022 4:11:21 PM By: Rhae Hammock RN Entered By: Rhae Hammock on 01/18/2022 09:59:54 -------------------------------------------------------------------------------- Clinic Level of Care Assessment Details Patient Name: Date of Service: Natalie Natalie, Natalie Natalie. 01/18/2022 10:15 Natalie Natalie Medical Record Number: [id number] Patient Account Number: [id number] Date of Birth/Sex: Treating RN: [birth year] (51 y.o. Natalie Natalie Primary Care Maicey Barrientez: Penni Homans Other Clinician: Referring Lakeesha Fontanilla: Treating Lanaysia Fritchman/Extender: Natalie Natalie in Treatment: 2 Clinic Level of Care Assessment  Items TOOL 4 Quantity Score X- 1 0 Use when only an EandM is performed on FOLLOW-UP visit ASSESSMENTS - Nursing Assessment / Reassessment X- 1 10 Reassessment of Co-morbidities (includes updates in patient status) X- 1 5 Reassessment of Adherence to Treatment Plan ASSESSMENTS - Wound and Skin Natalie ssessment / Reassessment X - Simple Wound Assessment / Reassessment - one wound 1 5 '[]'$  - 0 Complex Wound Assessment / Reassessment - multiple wounds X- 1 10 Dermatologic / Skin Assessment (not related to wound area) ASSESSMENTS - Focused Assessment '[]'$  - 0 Circumferential Edema Measurements - multi extremities '[]'$  - 0 Nutritional Assessment / Counseling / Intervention Natalie Natalie ([id number]) [phone].pdf Page 2 of 8 '[]'$  - 0 Lower Extremity Assessment (monofilament, tuning fork, pulses) '[]'$  - 0 Peripheral Arterial Disease Assessment (using hand held doppler) ASSESSMENTS - Ostomy and/or Continence Assessment and Care '[]'$  - 0 Incontinence Assessment and Management '[]'$  - 0 Ostomy Care Assessment and Management (repouching, etc.) PROCESS - Coordination of Care X - Simple Patient / Family Education for ongoing care 1 15 '[]'$  - 0 Complex (extensive) Patient / Family Education for ongoing care X- 1 10 Staff obtains Programmer, systems, Records, T Results / Process Orders est '[]'$  - 0 Staff telephones HHA, Nursing Homes / Clarify orders / etc '[]'$  - 0 Routine Transfer to another Facility (non-emergent condition) '[]'$  - 0 Routine Hospital Admission (non-emergent condition) '[]'$  - 0 New Admissions / Biomedical engineer / Ordering NPWT Apligraf, etc. , '[]'$  - 0 Emergency Hospital Admission (emergent condition) X- 1 10 Simple Discharge Coordination '[]'$  - 0 Complex (extensive) Discharge Coordination PROCESS - Special Needs '[]'$  - 0 Pediatric / Minor Patient Management '[]'$  - 0 Isolation Patient Management '[]'$  - 0 Hearing / Language / Visual special needs '[]'$  - 0 Assessment of  Community assistance (transportation, D/C planning, etc.) '[]'$  - 0 Additional assistance / Altered mentation '[]'$  - 0 Support Surface(s) Assessment (bed, cushion, seat, etc.) INTERVENTIONS - Wound Cleansing / Measurement X - Simple Wound  Cleansing - one wound 1 5 '[]'$  - 0 Complex Wound Cleansing - multiple wounds X- 1 5 Wound Imaging (photographs - any number of wounds) '[]'$  - 0 Wound Tracing (instead of photographs) X- 1 5 Simple Wound Measurement - one wound '[]'$  - 0 Complex Wound Measurement - multiple wounds INTERVENTIONS - Wound Dressings X - Small Wound Dressing one or multiple wounds 1 10 '[]'$  - 0 Medium Wound Dressing one or multiple wounds '[]'$  - 0 Large Wound Dressing one or multiple wounds '[]'$  - 0 Application of Medications - topical '[]'$  - 0 Application of Medications - injection INTERVENTIONS - Miscellaneous '[]'$  - 0 External ear exam '[]'$  - 0 Specimen Collection (cultures, biopsies, blood, body fluids, etc.) '[]'$  - 0 Specimen(s) / Culture(s) sent or taken to Lab for analysis '[]'$  - 0 Patient Transfer (multiple staff / Civil Service fast streamer / Similar devices) '[]'$  - 0 Simple Staple / Suture removal (25 or less) '[]'$  - 0 Complex Staple / Suture removal (26 or more) '[]'$  - 0 Hypo / Hyperglycemic Management (close monitor of Blood Glucose) Natalie Natalie ([id number]) [id number] Page 3 of 8 '[]'$  - 0 Ankle / Brachial Index (ABI) - do not check if billed separately X- 1 5 Vital Signs Has the patient been seen at the hospital within the last three years: Yes Total Score: 95 Level Of Care: New/Established - Level 3 Electronic Signature(s) Signed: 01/19/2022 10:43:44 AM By: Deon Pilling RN, BSN Entered By: Deon Pilling on 01/18/2022 10:24:11 -------------------------------------------------------------------------------- Encounter Discharge Information Details Patient Name: Date of Service: Natalie Natalie. 01/18/2022 10:15 Natalie Natalie Medical Record Number:  [id number] Patient Account Number: [id number] Date of Birth/Sex: Treating RN: 1970-09-23 (51 y.o. Natalie Natalie Primary Care Kloee Ballew: Penni Homans Other Clinician: Referring Chasin Findling: Treating Kortney Schoenfelder/Extender: Natalie Natalie in Treatment: 2 Encounter Discharge Information Items Discharge Condition: Stable Ambulatory Status: Ambulatory Discharge Destination: Home Transportation: Private Auto Accompanied By: self Schedule Follow-up Appointment: Yes Clinical Summary of Care: Electronic Signature(s) Signed: 01/19/2022 10:43:44 AM By: Deon Pilling RN, BSN Entered By: Deon Pilling on 01/18/2022 10:24:52 -------------------------------------------------------------------------------- Lower Extremity Assessment Details Patient Name: Date of Service: Natalie Natalie. 01/18/2022 10:15 Natalie Natalie Medical Record Number: [id number] Patient Account Number: [id number] Date of Birth/Sex: Treating RN: 01-31-1971 (51 y.o. Natalie Natalie, [name] Primary Care Laquesha Holcomb: Penni Homans Other Clinician: Referring Koury Roddy: Treating Vianey Caniglia/Extender: Grayling Congress Weeks in Treatment: 2 Electronic Signature(s) Signed: 01/18/2022 4:11:21 PM By: Rhae Hammock RN Entered By: Rhae Hammock on 01/18/2022 10:01:05 -------------------------------------------------------------------------------- Multi Wound Chart Details Patient Name: Date of Service: Natalie Natalie. 01/18/2022 10:15 Natalie Natalie Medical Record Number: [id number] Patient Account Number: [id number] Natalie Natalie, Natalie Natalie ([id number]) [id number] Page 4 of 8 Date of Birth/Sex: Treating RN: 1970-05-10 (51 y.o. F) Primary Care Arneshia Ade: Other Clinician: Penni Homans Referring Gwendelyn Lanting: Treating Chava Dulac/Extender: Grayling Congress Weeks in Treatment: 2 Vital Signs Height(in): 64 Pulse(bpm): Weight(lbs): 219 Blood Pressure(mmHg): Body Mass Index(BMI):  37.6 Temperature(F): Respiratory Rate(breaths/min): 17 [1:Photos:] [N/Natalie:N/Natalie] Right Breast N/Natalie N/Natalie Wound Location: Surgical Injury N/Natalie N/Natalie Wounding Event: Open Surgical Wound N/Natalie N/Natalie Primary Etiology: Sleep Apnea, Hypertension N/Natalie N/Natalie Comorbid History: 12/13/2021 N/Natalie N/Natalie Date Acquired: 2 N/Natalie N/Natalie Weeks of Treatment: Open N/Natalie N/Natalie Wound Status: No N/Natalie N/Natalie Wound Recurrence: 2.4x3.5x0.1 N/Natalie N/Natalie Measurements L x W x D (cm) 6.597 N/Natalie N/Natalie Natalie (cm) : rea 0.66 N/Natalie N/Natalie Volume (cm) : 44.20% N/Natalie N/Natalie % Reduction in Natalie rea: 81.40% N/Natalie N/Natalie % Reduction in Volume: Full Thickness With  Exposed Support N/Natalie N/Natalie Classification: Structures Medium N/Natalie N/Natalie Exudate Amount: Serosanguineous N/Natalie N/Natalie Exudate Type: red, brown N/Natalie N/Natalie Exudate Color: Distinct, outline attached N/Natalie N/Natalie Wound Margin: Large (67-100%) N/Natalie N/Natalie Granulation Amount: Red, Friable N/Natalie N/Natalie Granulation Quality: Small (1-33%) N/Natalie N/Natalie Necrotic Amount: Fat Layer (Subcutaneous Tissue): Yes N/Natalie N/Natalie Exposed Structures: Fascia: No Tendon: No Muscle: No Joint: No Bone: No None N/Natalie N/Natalie Epithelialization: Scarring: Yes N/Natalie N/Natalie Periwound Skin Texture: No Abnormalities Noted N/Natalie N/Natalie Periwound Skin Moisture: No Abnormalities Noted N/Natalie N/Natalie Periwound Skin Color: No Abnormality N/Natalie N/Natalie Temperature: Treatment Notes Wound #1 (Breast) Wound Laterality: Right Cleanser Soap and Water Discharge Instruction: May shower and wash wound with dial antibacterial soap and water prior to dressing change. Wound Cleanser Discharge Instruction: Cleanse the wound with wound cleanser prior to applying Natalie clean dressing using gauze sponges, not tissue or cotton balls. Peri-Wound Care Topical Primary Dressing Fibracol Plus Dressing, 4x4.38 in (collagen) Discharge Instruction: Moisten collagen with [place]. Secondary Dressing Zetuvit Plus Silicone Border Dressing 4x4 (in/in) Discharge Instruction: Apply silicone border over primary  dressing as directed. Natalie Natalie, Natalie Natalie ([id number]) [id number] Page 5 of 8 Secured With Compression Wrap Compression Stockings Add-Ons Electronic Signature(s) Signed: 01/18/2022 12:46:27 PM By: Kalman Shan DO Entered By: Kalman Shan on 01/18/2022 11:22:34 -------------------------------------------------------------------------------- Multi-Disciplinary Care Plan Details Patient Name: Date of Service: Natalie Natalie. 01/18/2022 10:15 Natalie Natalie Medical Record Number: [id number] Patient Account Number: [id number] Date of Birth/Sex: Treating RN: [birth year] (51 y.o. [name], [name] Primary Care Demetry Bendickson: Penni Homans Other Clinician: Referring Suheyb Raucci: Treating Jazz Rogala/Extender: Grayling Congress Weeks in Treatment: 2 Active Inactive Necrotic Tissue Nursing Diagnoses: Impaired tissue integrity related to necrotic/devitalized tissue Knowledge deficit related to management of necrotic/devitalized tissue Goals: Necrotic/devitalized tissue will be minimized in the wound bed Date Initiated: 12/30/2021 Target Resolution Date: 03/04/2022 Goal Status: Active Patient/caregiver will verbalize understanding of reason and process for debridement of necrotic tissue Date Initiated: 12/30/2021 Target Resolution Date: 03/04/2022 Goal Status: Active Interventions: Assess patient pain level pre-, during and post procedure and prior to discharge Provide education on necrotic tissue and debridement process Treatment Activities: Apply topical anesthetic as ordered : 12/30/2021 Notes: Wound/Skin Impairment Nursing Diagnoses: Impaired tissue integrity Knowledge deficit related to ulceration/compromised skin integrity Goals: Patient/caregiver will verbalize understanding of skin care regimen Date Initiated: 12/30/2021 Target Resolution Date: 03/04/2022 Goal Status: Active Interventions: Assess ulceration(s) every visit Treatment Activities: Skin care  regimen initiated : 12/30/2021 Topical wound management initiated : 12/30/2021 Notes: Natalie Natalie, Natalie Natalie ([id number]) [phone].pdf Page 6 of 8 Electronic Signature(s) Signed: 01/19/2022 10:43:44 AM By: Deon Pilling RN, BSN Entered By: Deon Pilling on 01/18/2022 10:06:31 -------------------------------------------------------------------------------- Pain Assessment Details Patient Name: Date of Service: Natalie Natalie. 01/18/2022 10:15 Natalie Natalie Medical Record Number: [id number] Patient Account Number: [id number] Date of Birth/Sex: Treating RN: 1970-09-11 (51 y.o. Natalie Natalie, [name] Primary Care Cagney Steenson: Penni Homans Other Clinician: Referring Achillies Buehl: Treating Dawn Convery/Extender: Grayling Congress Weeks in Treatment: 2 Active Problems Location of Pain Severity and Description of Pain Patient Has Paino No Site Locations Pain Management and Medication Current Pain Management: Electronic Signature(s) Signed: 01/18/2022 4:11:21 PM By: Rhae Hammock RN Entered By: Rhae Hammock on 01/18/2022 10:00:09 -------------------------------------------------------------------------------- Patient/Caregiver Education Details Patient Name: Date of Service: Natalie Natalie 11/21/2023andnbsp10:15 Natalie Natalie Medical Record Number: [id number] Patient Account Number: [id number] Date of Birth/Gender: Treating RN: 04-21-1970 (51 y.o. Natalie Natalie Primary Care Physician: Penni Homans Other Clinician: Referring Physician: Treating Physician/Extender: Grayling Congress  Weeks in Treatment: 2 Education Assessment Education Provided To: Patient CHERYSH, EPPERLY Natalie ([id number]) [id number] Page 7 of 8 Education Topics Provided Wound/Skin Impairment: Handouts: Skin Care Do's and Dont's Methods: Explain/Verbal Responses: Reinforcements needed Electronic Signature(s) Signed: 01/19/2022 10:43:44 AM By: Deon Pilling RN,  BSN Entered By: Deon Pilling on 01/18/2022 10:06:45 -------------------------------------------------------------------------------- Wound Assessment Details Patient Name: Date of Service: Natalie Natalie. 01/18/2022 10:15 Natalie Natalie Medical Record Number: [id number] Patient Account Number: [id number] Date of Birth/Sex: Treating RN: 26-May-1970 (51 y.o. Natalie Natalie, [name] Primary Care Saaya Procell: Penni Homans Other Clinician: Referring Jonah Nestle: Treating Mihail Prettyman/Extender: Grayling Congress Weeks in Treatment: 2 Wound Status Wound Number: 1 Primary Etiology: Open Surgical Wound Wound Location: Right Breast Wound Status: Open Wounding Event: Surgical Injury Comorbid History: Sleep Apnea, Hypertension Date Acquired: 12/13/2021 Weeks Of Treatment: 2 Clustered Wound: No Photos Wound Measurements Length: (cm) 2.4 Width: (cm) 3.5 Depth: (cm) 0.1 Area: (cm) 6.597 Volume: (cm) 0.66 % Reduction in Area: 44.2% % Reduction in Volume: 81.4% Epithelialization: None Tunneling: No Undermining: No Wound Description Classification: Full Thickness With Exposed Suppo Wound Margin: Distinct, outline attached Exudate Amount: Medium Exudate Type: Serosanguineous Exudate Color: red, brown rt Structures Foul Odor After Cleansing: No Slough/Fibrino Yes Wound Bed Granulation Amount: Large (67-100%) Exposed Structure Granulation Quality: Red, Friable Fascia Exposed: No Necrotic Amount: Small (1-33%) Fat Layer (Subcutaneous Tissue) Exposed: Yes Necrotic Quality: Adherent Slough Tendon Exposed: No Muscle Exposed: No Joint Exposed: No Bone Exposed: No Karapetian, Stepheny Natalie ([id number]) [phone].pdf Page 8 of 8 Periwound Skin Texture Texture Color No Abnormalities Noted: No No Abnormalities Noted: Yes Scarring: Yes Temperature / Pain Temperature: No Abnormality Moisture No Abnormalities Noted: Yes Treatment Notes Wound #1 (Breast) Wound Laterality:  Right Cleanser Soap and Water Discharge Instruction: May shower and wash wound with dial antibacterial soap and water prior to dressing change. Wound Cleanser Discharge Instruction: Cleanse the wound with wound cleanser prior to applying Natalie clean dressing using gauze sponges, not tissue or cotton balls. Peri-Wound Care Topical Primary Dressing Fibracol Plus Dressing, 4x4.38 in (collagen) Discharge Instruction: Moisten collagen with [place]. Secondary Dressing Zetuvit Plus Silicone Border Dressing 4x4 (in/in) Discharge Instruction: Apply silicone border over primary dressing as directed. Secured With Compression Wrap Compression Stockings Environmental education officer) Signed: 01/18/2022 4:11:21 PM By: Rhae Hammock RN Entered By: Rhae Hammock on 01/18/2022 10:08:59 -------------------------------------------------------------------------------- Vitals Details Patient Name: Date of Service: Natalie Natalie. 01/18/2022 10:15 Natalie Natalie Medical Record Number: [id number] Patient Account Number: [id number] Date of Birth/Sex: Treating RN: [birth year] (51 y.o. Natalie Natalie, [name] Primary Care Jermario Kalmar: Penni Homans Other Clinician: Referring Kienna Moncada: Treating Sahra Converse/Extender: Grayling Congress Weeks in Treatment: 2 Vital Signs Time Taken: 10:00 Respiratory Rate (breaths/min): 17 Height (in): 64 Reference Range: 80 - 120 mg / dl Weight (lbs): 219 Body Mass Index (BMI): 37.6 Electronic Signature(s) Signed: 01/18/2022 4:11:21 PM By: Rhae Hammock RN Entered By: Rhae Hammock on 01/18/2022 10:00:02

## 2022-01-25 ENCOUNTER — Encounter (HOSPITAL_BASED_OUTPATIENT_CLINIC_OR_DEPARTMENT_OTHER): Payer: 59 | Admitting: Internal Medicine

## 2022-01-28 ENCOUNTER — Encounter (HOSPITAL_BASED_OUTPATIENT_CLINIC_OR_DEPARTMENT_OTHER): Payer: 59 | Attending: Internal Medicine | Admitting: Internal Medicine

## 2022-01-28 DIAGNOSIS — T8131XD Disruption of external operation (surgical) wound, not elsewhere classified, subsequent encounter: Secondary | ICD-10-CM | POA: Diagnosis present

## 2022-01-28 DIAGNOSIS — G4730 Sleep apnea, unspecified: Secondary | ICD-10-CM | POA: Insufficient documentation

## 2022-01-28 DIAGNOSIS — S21001D Unspecified open wound of right breast, subsequent encounter: Secondary | ICD-10-CM

## 2022-01-28 DIAGNOSIS — X58XXXD Exposure to other specified factors, subsequent encounter: Secondary | ICD-10-CM | POA: Diagnosis not present

## 2022-01-28 DIAGNOSIS — T8141XS Infection following a procedure, superficial incisional surgical site, sequela: Secondary | ICD-10-CM | POA: Diagnosis not present

## 2022-01-28 DIAGNOSIS — I10 Essential (primary) hypertension: Secondary | ICD-10-CM | POA: Diagnosis not present

## 2022-01-28 NOTE — Progress Notes (Signed)
NEVAYAH, FAUST A ([id number]) [id number] Page 1 of 7 Visit Report for 01/28/2022 Chief Complaint Document Details Patient Name: Date of Service: [name], [name] 01/28/2022 9:45 A M Medical Record Number: [id number] Patient Account Number: [id number] Date of Birth/Sex: Treating RN: 07-Jan-1971 (51 y.o. F) Primary Care Provider: Penni Homans Other Clinician: Referring Provider: Treating Provider/Extender: Francoise Ceo in Treatment: 4 Information Obtained from: Patient Chief Complaint 12/30/2021; patient is here for review of a right breast wound Electronic Signature(s) Signed: 01/28/2022 11:44:41 AM By: Kalman Shan DO Entered By: Kalman Shan on 01/28/2022 10:42:50 -------------------------------------------------------------------------------- HPI Details Patient Name: Date of Service: Natalie Feil A. 01/28/2022 9:45 A M Medical Record Number: [id number] Patient Account Number: [id number] Date of Birth/Sex: Treating RN: 09-Jun-1970 (51 y.o. F) Primary Care Provider: Penni Homans Other Clinician: Referring Provider: Treating Provider/Extender: Francoise Ceo in Treatment: 4 History of Present Illness HPI Description: ADMISSION 12/30/2021 This is a 51 year old woman who is here for review of a wound on her right breast. She had bilateral breast reduction surgery on 11/24/2021 by Dr. Marla Roe and since has been followed in our office. It is a bit difficult to determine exactly when the wound opened but the patient states it was very soon after surgery sees says that the area was Steri-Stripped but there was fluid leaking through and she has since developed an open wound. She has had this debrided and most recently cultured showing Pseudomonas and E. coli she was given a course of ciprofloxacin which she completed earlier this week. She had been using Vaseline gauze was switched to collagen. She has not  started this yet she has simply been using the Vaseline gauze. 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 She has not been systemically unwell. She has some pain in the left breast incision but has no open wound here. 11/14; patient presents for follow-up. She has been using gentamicin ointment with silver alginate to the wound bed. Into the tunneled area. She states the tunneled area has closed and she is noted no drainage. She completed her course of ciprofloxacin that was also prescribed at last clinic visit. She had a wound culture done that showed no significant bacterial growth. 11/21; patient presents for follow-up. She has been using gentamicin ointment with silver alginate to the wound bed. She has no issues or complaints today. She reports minimal drainage. She denies signs of infection. 12/1; patient presents for follow-up. She has been using normal collagen to the wound bed. There is been improvement in wound healing. She has no issues or complaints today. Electronic Signature(s) Signed: 01/28/2022 11:44:41 AM By: Kalman Shan DO Entered By: Kalman Shan on 01/28/2022 10:43:25 Laqueta Due A ([id number]) [id number] Page 2 of 7 -------------------------------------------------------------------------------- Physical Exam Details Patient Name: Date of Service: [name] 01/28/2022 9:45 A M Medical Record Number: [id number] Patient Account Number: [id number] Date of Birth/Sex: Treating RN: 11/23/1970 (51 y.o. F) Primary Care Provider: Penni Homans Other Clinician: Referring Provider: Treating Provider/Extender: Grayling Congress Weeks in Treatment: 4 Constitutional respirations regular, non-labored and within target range for patient.. Cardiovascular 2+ dorsalis pedis/posterior tibialis pulses. Psychiatric pleasant and cooperative. Notes Right breast: Below the breast there is an open  wound with granulation tissue throughout. No signs of infection. Electronic Signature(s) Signed: 01/28/2022 11:44:41 AM By: Kalman Shan DO Entered By: Kalman Shan on 01/28/2022 10:43:58 -------------------------------------------------------------------------------- Physician Orders Details Patient Name: Date of Service: [name]  A. 01/28/2022 9:45 A M Medical Record Number: [id number] Patient Account Number: [id number] Date of Birth/Sex: Treating RN: 1970-05-19 (51 y.o. [name], [name] Primary Care Provider: Penni Homans Other Clinician: Referring Provider: Treating Provider/Extender: Francoise Ceo in Treatment: 4 Verbal / Phone Orders: No Diagnosis Coding ICD-10 Coding Code Description T81.31XD Disruption of external operation (surgical) wound, not elsewhere classified, subsequent encounter S21.001D Unspecified open wound of right breast, subsequent encounter T81.41XS Infection following a procedure, superficial incisional surgical site, sequela Follow-up Appointments ppointment in 1 week. - Dr. Heber [place] 10am Return A Anesthetic (In clinic) Topical Lidocaine 5% applied to wound bed Cellular or Tissue Based Products Cellular or Tissue Based Product Type: - wound center run insurance authorization for Epicord or organogenesis- denied. Bathing/ Shower/ Hygiene May shower and wash wound with soap and water. Off-Loading Other: - try to keep pressure off of wound as best you can Wound Treatment NIKOLE, SWARTZENTRUBER A ([id number]) [id number] Page 3 of 7 Wound #1 - Breast Wound Laterality: Right Cleanser: Soap and Water 1 x Per Day/30 Days Discharge Instructions: May shower and wash wound with dial antibacterial soap and water prior to dressing change. Cleanser: Wound Cleanser 1 x Per Day/30 Days Discharge Instructions: Cleanse the wound with wound cleanser prior to applying a clean dressing using gauze sponges, not tissue  or cotton balls. Prim Dressing: Fibracol Plus Dressing, 4x4.38 in (collagen) 1 x Per Day/30 Days ary Discharge Instructions: Moisten collagen with [place] Jelly. Secondary Dressing: Zetuvit Plus Silicone Border Dressing 4x4 (in/in) 1 x Per Day/30 Days Discharge Instructions: Apply silicone border over primary dressing as directed. Electronic Signature(s) Signed: 01/28/2022 11:44:41 AM By: Kalman Shan DO Entered By: Kalman Shan on 01/28/2022 10:44:04 -------------------------------------------------------------------------------- Problem List Details Patient Name: Date of Service: Natalie Feil A. 01/28/2022 9:45 A M Medical Record Number: [id number] Patient Account Number: [id number] Date of Birth/Sex: Treating RN: [birth year] (51 y.o. [name], [name] Primary Care Provider: Penni Homans Other Clinician: Referring Provider: Treating Provider/Extender: Grayling Congress Weeks in Treatment: 4 Active Problems ICD-10 Encounter Code Description Active Date MDM Diagnosis T81.31XD Disruption of external operation (surgical) wound, not elsewhere classified, 12/30/2021 No Yes subsequent encounter S21.001D Unspecified open wound of right breast, subsequent encounter 12/30/2021 No Yes T81.41XS Infection following a procedure, superficial incisional surgical site, sequela 12/30/2021 No Yes Inactive Problems Resolved Problems Electronic Signature(s) Signed: 01/28/2022 11:44:41 AM By: Kalman Shan DO Entered By: Kalman Shan on 01/28/2022 10:42:40 -------------------------------------------------------------------------------- Progress Note Details Patient Name: Date of Service: Natalie Feil A. 01/28/2022 9:45 A Virginia Rochester A ([id number]) [id number] Page 4 of 7 Medical Record Number: [id number] Patient Account Number: [id number] Date of Birth/Sex: Treating RN: [birth year] (51 y.o. F) Primary Care Provider: Penni Homans Other  Clinician: Referring Provider: Treating Provider/Extender: Francoise Ceo in Treatment: 4 Subjective Chief Complaint Information obtained from Patient 12/30/2021; patient is here for review of a right breast wound History of Present Illness (HPI) ADMISSION 12/30/2021 This is a 51 year old woman who is here for review of a wound on her right breast. She had bilateral breast reduction surgery on 11/24/2021 by Dr. Marla Roe and since has been followed in our office. It is a bit difficult to determine exactly when the wound opened but the patient states it was very soon after surgery sees says that the area was Steri-Stripped but there was fluid leaking through and she has since developed an open wound. She has had this debrided and most recently cultured showing Pseudomonas and E. coli she  was given a course of ciprofloxacin which she completed earlier this week. She had been using Vaseline gauze was switched to collagen. She has not started this yet she has simply been using the Vaseline gauze. 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 She has not been systemically unwell. She has some pain in the left breast incision but has no open wound here. 11/14; patient presents for follow-up. She has been using gentamicin ointment with silver alginate to the wound bed. Into the tunneled area. She states the tunneled area has closed and she is noted no drainage. She completed her course of ciprofloxacin that was also prescribed at last clinic visit. She had a wound culture done that showed no significant bacterial growth. 11/21; patient presents for follow-up. She has been using gentamicin ointment with silver alginate to the wound bed. She has no issues or complaints today. She reports minimal drainage. She denies signs of infection. 12/1; patient presents for follow-up. She has been using normal collagen to the wound bed. There is been improvement  in wound healing. She has no issues or complaints today. Patient History Information obtained from Patient. Family History Cancer - Mother,Father,Maternal Grandparents, Heart Disease - Father,Mother, Hypertension - Mother,Father, No family history of Diabetes, Hereditary Spherocytosis, Kidney Disease, Lung Disease, Seizures, Stroke, Thyroid Problems, Tuberculosis. Social History Never smoker, Marital Status - Married, Alcohol Use - Never, Drug Use - No History, Caffeine Use - Daily. Medical History Respiratory Patient has history of Sleep Apnea Cardiovascular Patient has history of Hypertension Endocrine Denies history of Type I Diabetes, Type II Diabetes Hospitalization/Surgery History - breast reduction. - bilateral knee replacements. - hysterectomy. - hernia repair. - appendectomy. Medical A Surgical History Notes nd Ear/Nose/Mouth/Throat TMJ Gastrointestinal celiac disease Oncologic skin cancer hx Objective Constitutional respirations regular, non-labored and within target range for patient.. Vitals Time Taken: 10:03 AM, Height: 64 in, Weight: 219 lbs, BMI: 37.6, Temperature: 98.7 F, Pulse: 74 bpm, Respiratory Rate: 17 breaths/min, Blood Pressure: 124/74 mmHg. Cardiovascular 2+ dorsalis pedis/posterior tibialis pulses. Psychiatric TICARA, WANER A ([id number]) [id number] Page 5 of 7 pleasant and cooperative. General Notes: Right breast: Below the breast there is an open wound with granulation tissue throughout. No signs of infection. Integumentary (Hair, Skin) Wound #1 status is Open. Original cause of wound was Surgical Injury. The date acquired was: 12/13/2021. The wound has been in treatment 4 weeks. The wound is located on the Right Breast. The wound measures 0.7cm length x 2cm width x 0.1cm depth; 1.1cm^2 area and 0.11cm^3 volume. There is Fat Layer (Subcutaneous Tissue) exposed. There is no tunneling or undermining noted. There is a  medium amount of serosanguineous drainage noted. The wound margin is distinct with the outline attached to the wound base. There is large (67-100%) red, friable granulation within the wound bed. There is a small (1-33%) amount of necrotic tissue within the wound bed including Adherent Slough. The periwound skin appearance had no abnormalities noted for moisture. The periwound skin appearance had no abnormalities noted for color. The periwound skin appearance exhibited: Scarring. Periwound temperature was noted as No Abnormality. Assessment Active Problems ICD-10 Disruption of external operation (surgical) wound, not elsewhere classified, subsequent encounter Unspecified open wound of right breast, subsequent encounter Infection following a procedure, superficial incisional surgical site, sequela Patient's wound has shown improvement in size and appearance since last clinic visit. I recommended continuing with collagen. Follow-up in 1 week. Plan Follow-up Appointments: Return Appointment in 1 week. - Dr.  Naylani Bradner 10am Anesthetic: (In clinic) Topical Lidocaine 5% applied to wound bed Cellular or Tissue Based Products: Cellular or Tissue Based Product Type: - wound center run insurance authorization for Epicord or organogenesis- denied. Bathing/ Shower/ Hygiene: May shower and wash wound with soap and water. Off-Loading: Other: - try to keep pressure off of wound as best you can WOUND #1: - Breast Wound Laterality: Right Cleanser: Soap and Water 1 x Per Day/30 Days Discharge Instructions: May shower and wash wound with dial antibacterial soap and water prior to dressing change. Cleanser: Wound Cleanser 1 x Per Day/30 Days Discharge Instructions: Cleanse the wound with wound cleanser prior to applying a clean dressing using gauze sponges, not tissue or cotton balls. Prim Dressing: Fibracol Plus Dressing, 4x4.38 in (collagen) 1 x Per Day/30 Days ary Discharge Instructions: Moisten collagen  with [place] Jelly. Secondary Dressing: Zetuvit Plus Silicone Border Dressing 4x4 (in/in) 1 x Per Day/30 Days Discharge Instructions: Apply silicone border over primary dressing as directed. 1. Collagen 2. Follow-up in 1 week Electronic Signature(s) Signed: 01/28/2022 11:44:41 AM By: Kalman Shan DO Entered By: Kalman Shan on 01/28/2022 10:44:57 -------------------------------------------------------------------------------- HxROS Details Patient Name: Date of Service: Natalie Feil A. 01/28/2022 9:45 A M Medical Record Number: [id number] Patient Account Number: [id number] Date of Birth/Sex: Treating RN: 02-23-1971 (51 y.o. F) Primary Care Provider: Penni Homans Other Clinician: Referring Provider: Treating Provider/Extender: Francoise Ceo in Treatment: [place], Lamar ([id number]) [id number] Page 6 of 7 Information Obtained From Patient Ear/Nose/Mouth/Throat Medical History: Past Medical History Notes: TMJ Respiratory Medical History: Positive for: Sleep Apnea Cardiovascular Medical History: Positive for: Hypertension Gastrointestinal Medical History: Past Medical History Notes: celiac disease Endocrine Medical History: Negative for: Type I Diabetes; Type II Diabetes Oncologic Medical History: Past Medical History Notes: skin cancer hx Immunizations Pneumococcal Vaccine: Received Pneumococcal Vaccination: No Implantable Devices None Hospitalization / Surgery History Type of Hospitalization/Surgery breast reduction bilateral knee replacements hysterectomy hernia repair appendectomy Family and Social History Cancer: Yes - Mother,Father,Maternal Grandparents; Diabetes: No; Heart Disease: Yes - Father,Mother; Hereditary Spherocytosis: No; Hypertension: Yes - Mother,Father; Kidney Disease: No; Lung Disease: No; Seizures: No; Stroke: No; Thyroid Problems: No; Tuberculosis: No; Never smoker; Marital Status  - Married; Alcohol Use: Never; Drug Use: No History; Caffeine Use: Daily; Financial Concerns: No; Food, Clothing or Shelter Needs: No; Support System Lacking: No; Transportation Concerns: No Electronic Signature(s) Signed: 01/28/2022 11:44:41 AM By: Kalman Shan DO Entered By: Kalman Shan on 01/28/2022 10:43:32 -------------------------------------------------------------------------------- SuperBill Details Patient Name: Date of Service: Natalie Feil A. 01/28/2022 Medical Record Number: [id number] Patient Account Number: [id number] Date of Birth/Sex: Treating RN: 1970-03-27 (51 y.o. F) Primary Care Provider: Penni Homans Other Clinician: Referring Provider: Treating Provider/Extender: Sherise, Geerdes A ([id number]) [id number] Page 7 of 7 Weeks in Treatment: 4 Diagnosis Coding ICD-10 Codes Code Description T81.31XD Disruption of external operation (surgical) wound, not elsewhere classified, subsequent encounter S21.001D Unspecified open wound of right breast, subsequent encounter T81.41XS Infection following a procedure, superficial incisional surgical site, sequela Facility Procedures : CPT4 Code: 11941740 Description: 99213 - WOUND CARE VISIT-LEV 3 EST PT Modifier: Quantity: 1 Physician Procedures : CPT4 Code Description Modifier [id number] 99213 - WC PHYS LEVEL 3 - EST PT ICD-10 Diagnosis Description T81.31XD Disruption of external operation (surgical) wound, not elsewhere classified, subsequent encounter S21.001D Unspecified open wound of right  breast, subsequent encounter T81.41XS Infection following a procedure, superficial incisional surgical site, sequela Quantity: 1 Electronic Signature(s) Signed: 01/28/2022 11:44:41 AM By: Kalman Shan DO  Signed: 01/28/2022 4:33:25 PM By: Deon Pilling RN, BSN Entered By: Deon Pilling on 01/28/2022 11:16:13

## 2022-01-31 ENCOUNTER — Encounter: Payer: 59 | Admitting: Physician Assistant

## 2022-01-31 NOTE — Progress Notes (Signed)
[name] ([id number]) [id number] Page 1 of 8 Visit Report for 01/28/2022 Arrival Information Details Patient Name: Date of Service: [name], [name] 01/28/2022 9:45 [name] Medical Record Number: [id number] Patient Account Number: [id number] Date of Birth/Sex: Treating RN: [name] (51 y.o. [name], [name] Primary Care [name]: [name] Other Clinician: Referring [name]: Treating [name]/Extender: [name] in Treatment: 4 Visit Information History Since Last Visit Added or deleted any medications: No Patient Arrived: Ambulatory Any new allergies or adverse reactions: No Arrival Time: 10:02 Had [name] fall or experienced change in No Accompanied By: self activities of daily living that may affect Transfer Assistance: None risk of falls: Patient Identification Verified: Yes Signs or symptoms of abuse/neglect since last visito No Secondary Verification Process Completed: Yes Hospitalized since last visit: No Patient Requires Transmission-Based Precautions: No Implantable device outside of the clinic excluding No Patient Has Alerts: No cellular tissue based products placed in the center since last visit: Has Dressing in Place as Prescribed: Yes Pain Present Now: No Electronic Signature(s) Signed: 01/31/2022 3:46:46 PM By: Natalie Hammock RN Entered By: [name] on 01/28/2022 10:03:06 -------------------------------------------------------------------------------- Clinic Level of Care Assessment Details Patient Name: Date of Service: [name], [name]. 01/28/2022 9:45 [name] Medical Record Number: [id number] Patient Account Number: [id number] Date of Birth/Sex: Treating RN: [birth year] (51 y.o. [name] Primary Care [name]: [name] Other Clinician: Referring [name]: Treating Lorry Anastasi/Extender: [name] in Treatment: 4 Clinic Level of Care Assessment  Items TOOL 4 Quantity Score X- 1 0 Use when only an EandM is performed on FOLLOW-UP visit ASSESSMENTS - Nursing Assessment / Reassessment X- 1 10 Reassessment of Co-morbidities (includes updates in patient status) X- 1 5 Reassessment of Adherence to Treatment Plan ASSESSMENTS - Wound and Skin [name] ssessment / Reassessment X - Simple Wound Assessment / Reassessment - one wound 1 5 '[]'$  - 0 Complex Wound Assessment / Reassessment - multiple wounds '[]'$  - 0 Dermatologic / Skin Assessment (not related to wound area) ASSESSMENTS - Focused Assessment '[]'$  - 0 Circumferential Edema Measurements - multi extremities '[]'$  - 0 Nutritional Assessment / Counseling / Intervention [name] ([id number]) [id number] Page 2 of 8 '[]'$  - 0 Lower Extremity Assessment (monofilament, tuning fork, pulses) '[]'$  - 0 Peripheral Arterial Disease Assessment (using hand held doppler) ASSESSMENTS - Ostomy and/or Continence Assessment and Care '[]'$  - 0 Incontinence Assessment and Management '[]'$  - 0 Ostomy Care Assessment and Management (repouching, etc.) PROCESS - Coordination of Care X - Simple Patient / Family Education for ongoing care 1 15 '[]'$  - 0 Complex (extensive) Patient / Family Education for ongoing care X- 1 10 Staff obtains Programmer, systems, Records, T Results / Process Orders est '[]'$  - 0 Staff telephones HHA, Nursing Homes / Clarify orders / etc '[]'$  - 0 Routine Transfer to another Facility (non-emergent condition) '[]'$  - 0 Routine Hospital Admission (non-emergent condition) '[]'$  - 0 New Admissions / Biomedical engineer / Ordering NPWT Apligraf, etc. , '[]'$  - 0 Emergency Hospital Admission (emergent condition) X- 1 10 Simple Discharge Coordination '[]'$  - 0 Complex (extensive) Discharge Coordination PROCESS - Special Needs '[]'$  - 0 Pediatric / Minor Patient Management '[]'$  - 0 Isolation Patient Management '[]'$  - 0 Hearing / Language / Visual special needs '[]'$  - 0 Assessment of  Community assistance (transportation, D/C planning, etc.) '[]'$  - 0 Additional assistance / Altered mentation '[]'$  - 0 Support Surface(s) Assessment (bed, cushion, seat, etc.) INTERVENTIONS - Wound Cleansing / Measurement X - Simple Wound  Cleansing - one wound 1 5 '[]'$  - 0 Complex Wound Cleansing - multiple wounds X- 1 5 Wound Imaging (photographs - any number of wounds) '[]'$  - 0 Wound Tracing (instead of photographs) X- 1 5 Simple Wound Measurement - one wound '[]'$  - 0 Complex Wound Measurement - multiple wounds INTERVENTIONS - Wound Dressings X - Small Wound Dressing one or multiple wounds 1 10 '[]'$  - 0 Medium Wound Dressing one or multiple wounds '[]'$  - 0 Large Wound Dressing one or multiple wounds '[]'$  - 0 Application of Medications - topical '[]'$  - 0 Application of Medications - injection INTERVENTIONS - Miscellaneous '[]'$  - 0 External ear exam '[]'$  - 0 Specimen Collection (cultures, biopsies, blood, body fluids, etc.) '[]'$  - 0 Specimen(s) / Culture(s) sent or taken to Lab for analysis '[]'$  - 0 Patient Transfer (multiple staff / Civil Service fast streamer / Similar devices) '[]'$  - 0 Simple Staple / Suture removal (25 or less) '[]'$  - 0 Complex Staple / Suture removal (26 or more) '[]'$  - 0 Hypo / Hyperglycemic Management (close monitor of Blood Glucose) [name], [name] ([id number]) [id number] Page 3 of 8 '[]'$  - 0 Ankle / Brachial Index (ABI) - do not check if billed separately X- 1 5 Vital Signs Has the patient been seen at the hospital within the last three years: Yes Total Score: 85 Level Of Care: New/Established - Level 3 Electronic Signature(s) Signed: 01/28/2022 4:33:25 PM By: Natalie Pilling RN, BSN Entered By: [name] on 01/28/2022 11:16:08 -------------------------------------------------------------------------------- Encounter Discharge Information Details Patient Name: Date of Service: [name]. 01/28/2022 9:45 [name] Medical Record Number: [id number] Patient  Account Number: [id number] Date of Birth/Sex: Treating RN: [birth year] (51 y.o. [name] Primary Care Ronelle Michie: [name] Other Clinician: Referring Moon Budde: Treating Torah Pinnock/Extender: [name] in Treatment: 4 Encounter Discharge Information Items Discharge Condition: Stable Ambulatory Status: Ambulatory Discharge Destination: Home Transportation: Private Auto Accompanied By: self Schedule Follow-up Appointment: Yes Clinical Summary of Care: Electronic Signature(s) Signed: 01/28/2022 4:33:25 PM By: Natalie Pilling RN, BSN Entered By: [name] on 01/28/2022 11:16:33 -------------------------------------------------------------------------------- Lower Extremity Assessment Details Patient Name: Date of Service: [name]. 01/28/2022 9:45 [name] Medical Record Number: [id number] Patient Account Number: [id number] Date of Birth/Sex: Treating RN: [birth year] (51 y.o. [name], [name] Primary Care Samyia Motter: [name] Other Clinician: Referring Masao Junker: Treating Idriss Quackenbush/Extender: Grayling Congress Weeks in Treatment: 4 Electronic Signature(s) Signed: 01/31/2022 3:46:46 PM By: Natalie Hammock RN Entered By: [name] on 01/28/2022 10:04:23 -------------------------------------------------------------------------------- Multi Wound Chart Details Patient Name: Date of Service: [name]. 01/28/2022 9:45 [name] Medical Record Number: [id number] Patient Account Number: [id number] [name], [name] ([id number]) [id number] Page 4 of 8 Date of Birth/Sex: Treating RN: [birth year] (51 y.o. F) Primary Care Sydnie Sigmund: Other Clinician: Penni [name] Referring Arietta Eisenstein: Treating Lunden Stieber/Extender: [name] in Treatment: 4 Vital Signs Height(in): 64 Pulse(bpm): 74 Weight(lbs): 219 Blood Pressure(mmHg): 124/74 Body Mass Index(BMI): 37.6 Temperature(F):  98.7 Respiratory Rate(breaths/min): 17 [1:Photos:] [N/[name]:N/[name]] Right Breast N/[name] N/[name] Wound Location: Surgical Injury N/[name] N/[name] Wounding Event: Open Surgical Wound N/[name] N/[name] Primary Etiology: Sleep Apnea, Hypertension N/[name] N/[name] Comorbid History: 12/13/2021 N/[name] N/[name] Date Acquired: 4 N/[name] N/[name] Weeks of Treatment: Open N/[name] N/[name] Wound Status: No N/[name] N/[name] Wound Recurrence: 0.7x2x0.1 N/[name] N/[name] Measurements L x W x D (cm) 1.1 N/[name] N/[name] [name] (cm) : rea 0.11 N/[name] N/[name] Volume (cm) : 90.70% N/[name] N/[name] % Reduction in [name] rea: 96.90% N/[name] N/[name] % Reduction in Volume:  Full Thickness With Exposed Support N/[name] N/[name] Classification: Structures Medium N/[name] N/[name] Exudate Amount: Serosanguineous N/[name] N/[name] Exudate Type: red, brown N/[name] N/[name] Exudate Color: Distinct, outline attached N/[name] N/[name] Wound Margin: Large (67-100%) N/[name] N/[name] Granulation Amount: Red, Friable N/[name] N/[name] Granulation Quality: Small (1-33%) N/[name] N/[name] Necrotic Amount: Fat Layer (Subcutaneous Tissue): Yes N/[name] N/[name] Exposed Structures: Fascia: No Tendon: No Muscle: No Joint: No Bone: No Small (1-33%) N/[name] N/[name] Epithelialization: Scarring: Yes N/[name] N/[name] Periwound Skin Texture: No Abnormalities Noted N/[name] N/[name] Periwound Skin Moisture: No Abnormalities Noted N/[name] N/[name] Periwound Skin Color: No Abnormality N/[name] N/[name] Temperature: Treatment Notes Electronic Signature(s) Signed: 01/28/2022 11:44:41 AM By: Kalman Shan DO Entered By: Kalman Shan on 01/28/2022 10:42:45 -------------------------------------------------------------------------------- Multi-Disciplinary Care Plan Details Patient Name: Date of Service: [name]. 01/28/2022 9:45 [name] Medical Record Number: [id number] Patient Account Number: [id number] Date of Birth/Sex: Treating RN: [birth year] (51 y.o. [name] Primary Care Jaxten Brosh: [name] Other Clinician: [name] ([id number]) [id number] Page 5 of 8 Referring Adisa Litt: Treating  Tai Syfert/Extender: [name] in Treatment: 4 Active Inactive Necrotic Tissue Nursing Diagnoses: Impaired tissue integrity related to necrotic/devitalized tissue Knowledge deficit related to management of necrotic/devitalized tissue Goals: Necrotic/devitalized tissue will be minimized in the wound bed Date Initiated: 12/30/2021 Target Resolution Date: 03/04/2022 Goal Status: Active Patient/caregiver will verbalize understanding of reason and process for debridement of necrotic tissue Date Initiated: 12/30/2021 Target Resolution Date: 03/04/2022 Goal Status: Active Interventions: Assess patient pain level pre-, during and post procedure and prior to discharge Provide education on necrotic tissue and debridement process Treatment Activities: Apply topical anesthetic as ordered : 12/30/2021 Notes: Wound/Skin Impairment Nursing Diagnoses: Impaired tissue integrity Knowledge deficit related to ulceration/compromised skin integrity Goals: Patient/caregiver will verbalize understanding of skin care regimen Date Initiated: 12/30/2021 Target Resolution Date: 03/04/2022 Goal Status: Active Interventions: Assess ulceration(s) every visit Treatment Activities: Skin care regimen initiated : 12/30/2021 Topical wound management initiated : 12/30/2021 Notes: Electronic Signature(s) Signed: 01/28/2022 4:33:25 PM By: Natalie Pilling RN, BSN Entered By: [name] on 01/28/2022 10:27:05 -------------------------------------------------------------------------------- Pain Assessment Details Patient Name: Date of Service: [name]. 01/28/2022 9:45 [name] Medical Record Number: [id number] Patient Account Number: [id number] Date of Birth/Sex: Treating RN: [birth year], [name] (51 y.o. [name], [name] Primary Care Rosalyn Archambault: [name] Other Clinician: Referring Rohith Fauth: Treating Shellie Goettl/Extender: Grayling Congress Weeks in Treatment: 4 Active  Problems Location of Pain Severity and Description of Pain Patient Has Paino No Site Locations [name], [name] ([id number]) [id number] Page 6 of 8 Pain Management and Medication Current Pain Management: Electronic Signature(s) Signed: 01/31/2022 3:46:46 PM By: Natalie Hammock RN Entered By: [name] on 01/28/2022 10:04:17 -------------------------------------------------------------------------------- Patient/Caregiver Education Details Patient Name: Date of Service: [name] 12/1/2023andnbsp9:45 [name] Medical Record Number: [id number] Patient Account Number: [id number] Date of Birth/Gender: Treating RN: [name][birth year] (51 y.o. [name] Primary Care Physician: [name] Other Clinician: Referring Physician: Treating Physician/Extender: [name] in Treatment: 4 Education Assessment Education Provided To: Patient Education Topics Provided Wound/Skin Impairment: Handouts: Skin Care Do's and Dont's Methods: Explain/Verbal Responses: Reinforcements needed Electronic Signature(s) Signed: 01/28/2022 4:33:25 PM By: Natalie Pilling RN, BSN Entered By: [name] on 01/28/2022 10:27:16 -------------------------------------------------------------------------------- Wound Assessment Details Patient Name: Date of Service: [name]. 01/28/2022 9:45 [name] Medical Record Number: [id number] Patient Account Number: [id number] Date of Birth/Sex: Treating RN: [name], [name] (51 y.o. Benjaman Lobe Primary Care Devone Bonilla: [name] Other Clinician: [name] ([id number])  [phone].pdf Page 7 of 8 Referring Azzan Butler: Treating Tarvares Lant/Extender: [name] in Treatment: 4 Wound Status Wound Number: 1 Primary Etiology: Open Surgical Wound Wound Location: Right Breast Wound Status: Open Wounding Event: Surgical Injury Comorbid History: Sleep  Apnea, Hypertension Date Acquired: 12/13/2021 Weeks Of Treatment: 4 Clustered Wound: No Photos Wound Measurements Length: (cm) Width: (cm) Depth: (cm) Area: (cm) Volume: (cm) 0.7 % Reduction in Area: 90.7% 2 % Reduction in Volume: 96.9% 0.1 Epithelialization: Small (1-33%) 1.1 Tunneling: No 0.11 Undermining: No Wound Description Classification: Full Thickness With Exposed Support Structures Wound Margin: Distinct, outline attached Exudate Amount: Medium Exudate Type: Serosanguineous Exudate Color: red, brown Foul Odor After Cleansing: No Slough/Fibrino Yes Wound Bed Granulation Amount: Large (67-100%) Exposed Structure Granulation Quality: Red, Friable Fascia Exposed: No Necrotic Amount: Small (1-33%) Fat Layer (Subcutaneous Tissue) Exposed: Yes Necrotic Quality: Adherent Slough Tendon Exposed: No Muscle Exposed: No Joint Exposed: No Bone Exposed: No Periwound Skin Texture Texture Color No Abnormalities Noted: No No Abnormalities Noted: Yes Scarring: Yes Temperature / Pain Temperature: No Abnormality Moisture No Abnormalities Noted: Yes Treatment Notes Wound #1 (Breast) Wound Laterality: Right Cleanser Soap and Water Discharge Instruction: May shower and wash wound with dial antibacterial soap and water prior to dressing change. Wound Cleanser Discharge Instruction: Cleanse the wound with wound cleanser prior to applying [name] clean dressing using gauze sponges, not tissue or cotton balls. Peri-Wound Care Topical Primary Dressing Fibracol Plus Dressing, 4x4.38 in (collagen) Discharge Instruction: Moisten collagen with [place]. [name], [name] ([id number]) [id number] Page 8 of 8 Secondary Dressing Zetuvit Plus Silicone Border Dressing 4x4 (in/in) Discharge Instruction: Apply silicone border over primary dressing as directed. Secured With Compression Wrap Compression Stockings Environmental education officer) Signed: 01/31/2022  3:46:46 PM By: Natalie Hammock RN Entered By: [name] on 01/28/2022 10:07:35 -------------------------------------------------------------------------------- Vitals Details Patient Name: Date of Service: [name]. 01/28/2022 9:45 [name] Medical Record Number: [id number] Patient Account Number: [id number] Date of Birth/Sex: Treating RN: [birth year], [name] (51 y.o. [name], [name] Primary Care Kaikoa Magro: [name] Other Clinician: Referring Lorik Guo: Treating Raylyn Speckman/Extender: Grayling Congress Weeks in Treatment: 4 Vital Signs Time Taken: 10:03 Temperature (F): 98.7 Height (in): 64 Pulse (bpm): 74 Weight (lbs): 219 Respiratory Rate (breaths/min): 17 Body Mass Index (BMI): 37.6 Blood Pressure (mmHg): 124/74 Reference Range: 80 - 120 mg / dl Electronic Signature(s) Signed: 01/31/2022 3:46:46 PM By: Natalie Hammock RN Entered By: [name] on 01/28/2022 10:04:09

## 2022-02-01 ENCOUNTER — Other Ambulatory Visit: Payer: Self-pay | Admitting: Family Medicine

## 2022-02-01 DIAGNOSIS — I10 Essential (primary) hypertension: Secondary | ICD-10-CM

## 2022-02-01 NOTE — Progress Notes (Unsigned)
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 Marla Roe presents to clinic for postoperative follow-up.   She was last seen here in clinic on 01/10/2022.  At that time, wound was noted to be 5 x 2.75 x 0.5 cm.  Excellent underlying granular tissue.  Tunneling at inferior lateral aspect of the wound had also granulated up considerably, approximately 0.75 cm depth.  No drainage or slough was noted.  Remainder of incisions.  Healed.  Discussed scar mitigation treatments.  She continued to see wound care, as well.  Reviewed most recent encounter.  She has been transitioned to moistened collagen daily.  She was last seen 01/28/2022 and was without any complaints and had seen considerable improvement in her wound.  Today,

## 2022-02-02 ENCOUNTER — Other Ambulatory Visit: Payer: Self-pay | Admitting: Family Medicine

## 2022-02-02 ENCOUNTER — Ambulatory Visit (INDEPENDENT_AMBULATORY_CARE_PROVIDER_SITE_OTHER): Payer: 59 | Admitting: Physician Assistant

## 2022-02-02 ENCOUNTER — Encounter: Payer: Self-pay | Admitting: Physician Assistant

## 2022-02-02 VITALS — BP 117/78 | HR 83

## 2022-02-02 DIAGNOSIS — Z9889 Other specified postprocedural states: Secondary | ICD-10-CM

## 2022-02-04 ENCOUNTER — Encounter (HOSPITAL_BASED_OUTPATIENT_CLINIC_OR_DEPARTMENT_OTHER): Payer: 59 | Admitting: Internal Medicine

## 2022-02-04 DIAGNOSIS — T8141XS Infection following a procedure, superficial incisional surgical site, sequela: Secondary | ICD-10-CM | POA: Diagnosis not present

## 2022-02-04 DIAGNOSIS — T8131XD Disruption of external operation (surgical) wound, not elsewhere classified, subsequent encounter: Secondary | ICD-10-CM | POA: Diagnosis not present

## 2022-02-04 DIAGNOSIS — S21001D Unspecified open wound of right breast, subsequent encounter: Secondary | ICD-10-CM | POA: Diagnosis not present

## 2022-02-05 NOTE — Progress Notes (Signed)
[name], PICKEREL [name] ([id number]) [id number] Page 1 of 7 Visit Report for 02/04/2022 Chief Complaint Document Details Patient Name: Date of Service: [name], [name] 02/04/2022 10:00 [name] Medical Record Number: [id number] Patient Account Number: [id number] Date of Birth/Sex: Treating RN: [birth year] (51 y.o. F) Primary Care Provider: Penni Homans Other Clinician: Referring Provider: Treating Provider/Extender: Francoise Ceo in Treatment: 5 Information Obtained from: Patient Chief Complaint 12/30/2021; patient is here for review of [name] right breast wound Electronic Signature(s) Signed: 02/04/2022 12:38:41 PM By: Kalman Shan DO Entered By: Kalman Shan on 02/04/2022 11:27:40 -------------------------------------------------------------------------------- HPI Details Patient Name: Date of Service: Natalie Feil [name]. 02/04/2022 10:00 [name] Medical Record Number: [id number] Patient Account Number: [id number] Date of Birth/Sex: Treating RN: 04-07-1970 (51 y.o. F) Primary Care Provider: Penni Homans Other Clinician: Referring Provider: Treating Provider/Extender: Francoise Ceo in Treatment: 5 History of Present Illness HPI Description: ADMISSION 12/30/2021 This is [name] who is here for review of [name] wound on her right breast. She had bilateral breast reduction surgery on 11/24/2021 by Dr. Marla Roe and since has been followed in our office. It is [name] bit difficult to determine exactly when the wound opened but the patient states it was very soon after surgery sees says that the area was Steri-Stripped but there was fluid leaking through and she has since developed an open wound. She has had this debrided and most recently cultured showing Pseudomonas and E. coli she was given [name] course of ciprofloxacin which she completed earlier this week. She had been using Vaseline gauze was switched to collagen. She has  not started this yet she has simply been using the Vaseline gauze. 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 She has not been systemically unwell. She has some pain in the left breast incision but has no open wound here. 11/14; patient presents for follow-up. She has been using gentamicin ointment with silver alginate to the wound bed. Into the tunneled area. She states the tunneled area has closed and she is noted no drainage. She completed her course of ciprofloxacin that was also prescribed at last clinic visit. She had [name] wound culture done that showed no significant bacterial growth. 11/21; patient presents for follow-up. She has been using gentamicin ointment with silver alginate to the wound bed. She has no issues or complaints today. She reports minimal drainage. She denies signs of infection. 12/1; patient presents for follow-up. She has been using normal collagen to the wound bed. There is been improvement in wound healing. She has no issues or complaints today. 12/8; patient presents for follow-up. She has been using collagen to the wound bed. She states that recently she has not been dressing this because the size is very small. She has no issues or complaints today. Electronic Signature(s) Signed: 02/04/2022 12:38:41 PM By: Kalman Shan DO Entered By: Kalman Shan on 02/04/2022 11:28:07 [name] ([id number]) [id number] Page 2 of 7 -------------------------------------------------------------------------------- Physical Exam Details Patient Name: Date of Service: [name], BUCKHALTER [name]. 02/04/2022 10:00 [name] Medical Record Number: [id number] Patient Account Number: [id number] Date of Birth/Sex: Treating RN: Mar 11, 1970 (51 y.o. F) Primary Care Provider: Penni Homans Other Clinician: Referring Provider: Treating Provider/Extender: Grayling Congress Weeks in Treatment: 5 Constitutional respirations  regular, non-labored and within target range for patient.[name] Psychiatric pleasant and cooperative. Notes Right breast: Below the breast there is [name] small open wound with granulation tissue but mostly  epithelization to the surrounding edges. No signs of infection. Electronic Signature(s) Signed: 02/04/2022 12:38:41 PM By: Kalman Shan DO Entered By: Kalman Shan on 02/04/2022 11:28:57 -------------------------------------------------------------------------------- Physician Orders Details Patient Name: Date of Service: Natalie Feil [name]. 02/04/2022 10:00 [name] Medical Record Number: [id number] Patient Account Number: [id number] Date of Birth/Sex: Treating RN: Sep 15, 1970 (51 y.o. [name], [name] Primary Care Provider: Penni Homans Other Clinician: Referring Provider: Treating Provider/Extender: Grayling Congress Weeks in Treatment: 5 Verbal / Phone Orders: No Diagnosis Coding ICD-10 Coding Code Description T81.31XD Disruption of external operation (surgical) wound, not elsewhere classified, subsequent encounter S21.001D Unspecified open wound of right breast, subsequent encounter T81.41XS Infection following [name] procedure, superficial incisional surgical site, sequela Follow-up Appointments ppointment in 2 weeks. - Dr. Heber Geddes Return [name] Anesthetic (In clinic) Topical Lidocaine 5% applied to wound bed Cellular or Tissue Based Products Cellular or Tissue Based Product Type: - wound center run insurance authorization for Epicord or organogenesis- denied. Bathing/ Shower/ Hygiene May shower and wash wound with soap and water. Off-Loading Other: - try to keep pressure off of wound as best you can Wound Treatment Wound #1 - Breast Wound Laterality: Right [name] ([id number]) [phone].pdf Page 3 of 7 Cleanser: Soap and Water 1 x Per Day/30 Days Discharge Instructions: May shower and wash wound with dial antibacterial soap and water prior  to dressing change. Cleanser: Wound Cleanser 1 x Per Day/30 Days Discharge Instructions: Cleanse the wound with wound cleanser prior to applying [name] clean dressing using gauze sponges, not tissue or cotton balls. Topical: bacitracin 1 x Per Day/30 Days Discharge Instructions: purchase on the counter. apply it daily. Secondary Dressing: Zetuvit Plus Silicone Border Dressing 4x4 (in/in) 1 x Per Day/30 Days Discharge Instructions: Apply silicone border over primary dressing as directed. Electronic Signature(s) Signed: 02/04/2022 12:38:41 PM By: Kalman Shan DO Entered By: Kalman Shan on 02/04/2022 11:29:03 -------------------------------------------------------------------------------- Problem List Details Patient Name: Date of Service: Natalie Feil [name]. 02/04/2022 10:00 [name] Medical Record Number: [id number] Patient Account Number: [id number] Date of Birth/Sex: Treating RN: [birth year] (51 y.o. [name], [name] Primary Care Provider: Penni Homans Other Clinician: Referring Provider: Treating Provider/Extender: Grayling Congress Weeks in Treatment: 5 Active Problems ICD-10 Encounter Code Description Active Date MDM Diagnosis T81.31XD Disruption of external operation (surgical) wound, not elsewhere classified, 12/30/2021 No Yes subsequent encounter S21.001D Unspecified open wound of right breast, subsequent encounter 12/30/2021 No Yes T81.41XS Infection following [name] procedure, superficial incisional surgical site, sequela 12/30/2021 No Yes Inactive Problems Resolved Problems Electronic Signature(s) Signed: 02/04/2022 12:38:41 PM By: Kalman Shan DO Entered By: Kalman Shan on 02/04/2022 11:27:28 -------------------------------------------------------------------------------- Progress Note Details Patient Name: Date of Service: Natalie Feil [name]. 02/04/2022 10:00 Anthony Record Number: [id number] Patient Account Number: [id number] ANNALYSE, LANGLAIS [name] ([id number])  [id number] Page 4 of 7 Date of Birth/Sex: Treating RN: 04-13-1970 (51 y.o. F) Primary Care Provider: Other Clinician: Penni Homans Referring Provider: Treating Provider/Extender: Francoise Ceo in Treatment: 5 Subjective Chief Complaint Information obtained from Patient 12/30/2021; patient is here for review of [name] right breast wound History of Present Illness (HPI) ADMISSION 12/30/2021 This is [name] who is here for review of [name] wound on her right breast. She had bilateral breast reduction surgery on 11/24/2021 by Dr. Marla Roe and since has been followed in our office. It is [name] bit difficult to determine exactly when the wound opened but the patient states it was very soon after surgery sees says that the  area was Steri-Stripped but there was fluid leaking through and she has since developed an open wound. She has had this debrided and most recently cultured showing Pseudomonas and E. coli she was given [name] course of ciprofloxacin which she completed earlier this week. She had been using Vaseline gauze was switched to collagen. She has not started this yet she has simply been using the Vaseline gauze. 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 She has not been systemically unwell. She has some pain in the left breast incision but has no open wound here. 11/14; patient presents for follow-up. She has been using gentamicin ointment with silver alginate to the wound bed. Into the tunneled area. She states the tunneled area has closed and she is noted no drainage. She completed her course of ciprofloxacin that was also prescribed at last clinic visit. She had [name] wound culture done that showed no significant bacterial growth. 11/21; patient presents for follow-up. She has been using gentamicin ointment with silver alginate to the wound bed. She has no issues or complaints today. She reports minimal  drainage. She denies signs of infection. 12/1; patient presents for follow-up. She has been using normal collagen to the wound bed. There is been improvement in wound healing. She has no issues or complaints today. 12/8; patient presents for follow-up. She has been using collagen to the wound bed. She states that recently she has not been dressing this because the size is very small. She has no issues or complaints today. Patient History Information obtained from Patient. Family History Cancer - Mother,Father,Maternal Grandparents, Heart Disease - Father,Mother, Hypertension - Mother,Father, No family history of Diabetes, Hereditary Spherocytosis, Kidney Disease, Lung Disease, Seizures, Stroke, Thyroid Problems, Tuberculosis. Social History Never smoker, Marital Status - Married, Alcohol Use - Never, Drug Use - No History, Caffeine Use - Daily. Medical History Respiratory Patient has history of Sleep Apnea Cardiovascular Patient has history of Hypertension Endocrine Denies history of Type I Diabetes, Type II Diabetes Hospitalization/Surgery History - breast reduction. - bilateral knee replacements. - hysterectomy. - hernia repair. - appendectomy. Medical [name] Surgical History Notes nd Ear/Nose/Mouth/Throat TMJ Gastrointestinal celiac disease Oncologic skin cancer hx Objective Constitutional respirations regular, non-labored and within target range for patient.. Vitals Time Taken: 10:08 AM, Height: 64 in, Weight: 219 lbs, BMI: 37.6, Temperature: 98.1 F, Pulse: 75 bpm, Respiratory Rate: 18 breaths/min, Blood Pressure: 117/81 mmHg. Psychiatric pleasant and cooperative. ZIONNA, HOMEWOOD [name] ([id number]) [id number] Page 5 of 7 General Notes: Right breast: Below the breast there is [name] small open wound with granulation tissue but mostly epithelization to the surrounding edges. No signs of infection. Integumentary (Hair, Skin) Wound #1 status is Open. Original  cause of wound was Surgical Injury. The date acquired was: 12/13/2021. The wound has been in treatment 5 weeks. The wound is located on the Right Breast. The wound measures 0.3cm length x 0.7cm width x 0.1cm depth; 0.165cm^2 area and 0.016cm^3 volume. There is Fat Layer (Subcutaneous Tissue) exposed. There is no tunneling or undermining noted. There is [name] medium amount of serosanguineous drainage noted. The wound margin is distinct with the outline attached to the wound base. There is large (67-100%) red, friable granulation within the wound bed. There is no necrotic tissue within the wound bed. The periwound skin appearance had no abnormalities noted for moisture. The periwound skin appearance had no abnormalities noted for color. The periwound skin appearance exhibited: Scarring. Periwound temperature was noted as No Abnormality. Assessment  Active Problems ICD-10 Disruption of external operation (surgical) wound, not elsewhere classified, subsequent encounter Unspecified open wound of right breast, subsequent encounter Infection following [name] procedure, superficial incisional surgical site, sequela Patient's wound has shown improvement in size and appearance since last clinic visit. At this point it is too tedious to use collagen. I recommended bacitracin ointment daily and keeping the area protected. Follow-up in 2 weeks. Plan Follow-up Appointments: Return Appointment in 2 weeks. - Dr. Heber [place] Anesthetic: (In clinic) Topical Lidocaine 5% applied to wound bed Cellular or Tissue Based Products: Cellular or Tissue Based Product Type: - wound center run insurance authorization for Epicord or organogenesis- denied. Bathing/ Shower/ Hygiene: May shower and wash wound with soap and water. Off-Loading: Other: - try to keep pressure off of wound as best you can WOUND #1: - Breast Wound Laterality: Right Cleanser: Soap and Water 1 x Per Day/30 Days Discharge Instructions: May shower and wash wound  with dial antibacterial soap and water prior to dressing change. Cleanser: Wound Cleanser 1 x Per Day/30 Days Discharge Instructions: Cleanse the wound with wound cleanser prior to applying [name] clean dressing using gauze sponges, not tissue or cotton balls. Topical: bacitracin 1 x Per Day/30 Days Discharge Instructions: purchase on the counter. apply it daily. Secondary Dressing: Zetuvit Plus Silicone Border Dressing 4x4 (in/in) 1 x Per Day/30 Days Discharge Instructions: Apply silicone border over primary dressing as directed. 1. Bacitracin ointment 2. Follow-up in 2 weeks Electronic Signature(s) Signed: 02/04/2022 12:38:41 PM By: Kalman Shan DO Entered By: Kalman Shan on 02/04/2022 11:31:15 -------------------------------------------------------------------------------- HxROS Details Patient Name: Date of Service: Natalie Feil [name]. 02/04/2022 10:00 [name] Medical Record Number: [id number] Patient Account Number: [id number] Date of Birth/Sex: Treating RN: 06-05-1970 (51 y.o. F) Primary Care Provider: Penni Homans Other Clinician: Referring Provider: Treating Provider/Extender: Francoise Ceo in Treatment: 5 SONNA, LIPSKY [name] ([id number]) [id number] Page 6 of 7 Information Obtained From Patient Ear/Nose/Mouth/Throat Medical History: Past Medical History Notes: TMJ Respiratory Medical History: Positive for: Sleep Apnea Cardiovascular Medical History: Positive for: Hypertension Gastrointestinal Medical History: Past Medical History Notes: celiac disease Endocrine Medical History: Negative for: Type I Diabetes; Type II Diabetes Oncologic Medical History: Past Medical History Notes: skin cancer hx Immunizations Pneumococcal Vaccine: Received Pneumococcal Vaccination: No Implantable Devices None Hospitalization / Surgery History Type of Hospitalization/Surgery breast reduction bilateral knee  replacements hysterectomy hernia repair appendectomy Family and Social History Cancer: Yes - Mother,Father,Maternal Grandparents; Diabetes: No; Heart Disease: Yes - Father,Mother; Hereditary Spherocytosis: No; Hypertension: Yes - Mother,Father; Kidney Disease: No; Lung Disease: No; Seizures: No; Stroke: No; Thyroid Problems: No; Tuberculosis: No; Never smoker; Marital Status - Married; Alcohol Use: Never; Drug Use: No History; Caffeine Use: Daily; Financial Concerns: No; Food, Clothing or Shelter Needs: No; Support System Lacking: No; Transportation Concerns: No Electronic Signature(s) Signed: 02/04/2022 12:38:41 PM By: Kalman Shan DO Entered By: Kalman Shan on 02/04/2022 11:28:11 -------------------------------------------------------------------------------- SuperBill Details Patient Name: Date of Service: Natalie Feil [name]. 02/04/2022 Medical Record Number: [id number] Patient Account Number: [id number] Date of Birth/Sex: Treating RN: [birth year] (51 y.o. Debby Bud Primary Care Provider: Penni Homans Other Clinician: Referring Provider: Treating Provider/Extender: Theadora, Noyes [name] ([id number]) [id number] Page 7 of 7 Weeks in Treatment: 5 Diagnosis Coding ICD-10 Codes Code Description T81.31XD Disruption of external operation (surgical) wound, not elsewhere classified, subsequent encounter S21.001D Unspecified open wound of right breast, subsequent encounter T81.41XS Infection following [name] procedure, superficial incisional surgical site, sequela Facility Procedures : CPT4 Code: [id number] Description:  93968 - WOUND CARE VISIT-LEV 3 EST PT Modifier: Quantity: 1 Physician Procedures : CPT4 Code Description Modifier [id number] - WC PHYS LEVEL 3 - EST PT ICD-10 Diagnosis Description T81.31XD Disruption of external operation (surgical) wound, not elsewhere classified, subsequent encounter S21.001D Unspecified  open wound of right  breast, subsequent encounter T81.41XS Infection following [name] procedure, superficial incisional surgical site, sequela Quantity: 1 Electronic Signature(s) Signed: 02/04/2022 12:38:41 PM By: Kalman Shan DO Entered By: Kalman Shan on 02/04/2022 11:31:27

## 2022-02-09 NOTE — Progress Notes (Signed)
Natalie, [name] [name] ([id number]) [id number] Page 1 of 8 Visit Report for 02/04/2022 Arrival Information Details Patient Name: Date of Service: [name], COIA 02/04/2022 10:00 [name] Medical Record Number: [id number] Patient Account Number: [id number] Date of Birth/Sex: Treating RN: 1970/07/06 (51 y.o. F) Primary Care Jordany Russett: Penni Homans Other Clinician: Referring Martisha Toulouse: Treating Sueann Brownley/Extender: Francoise Ceo in Treatment: 5 Visit Information History Since Last Visit Added or deleted any medications: No Patient Arrived: Ambulatory Any new allergies or adverse reactions: No Arrival Time: 10:05 Had [name] fall or experienced change in No Accompanied By: self activities of daily living that may affect Transfer Assistance: None risk of falls: Patient Identification Verified: Yes Signs or symptoms of abuse/neglect since last visito No Secondary Verification Process Completed: Yes Hospitalized since last visit: No Patient Requires Transmission-Based Precautions: No Implantable device outside of the clinic excluding No Patient Has Alerts: No cellular tissue based products placed in the center since last visit: Pain Present Now: No Electronic Signature(s) Signed: 02/09/2022 12:00:46 PM By: Erenest Blank Entered By: Erenest Blank on 02/04/2022 10:06:06 -------------------------------------------------------------------------------- Clinic Level of Care Assessment Details Patient Name: Date of Service: [name], KATH [name]. 02/04/2022 10:00 [name] Medical Record Number: [id number] Patient Account Number: [id number] Date of Birth/Sex: Treating RN: April 07, 1970 (51 y.o. [name], Meta.Reding Primary Care Jerimah Witucki: Penni Homans Other Clinician: Referring Yamili Lichtenwalner: Treating Laniqua Torrens/Extender: Francoise Ceo in Treatment: 5 Clinic Level of Care Assessment Items TOOL 4 Quantity Score X- 1 0 Use when only an EandM is performed  on FOLLOW-UP visit ASSESSMENTS - Nursing Assessment / Reassessment X- 1 10 Reassessment of Co-morbidities (includes updates in patient status) X- 1 5 Reassessment of Adherence to Treatment Plan ASSESSMENTS - Wound and Skin [name] ssessment / Reassessment X - Simple Wound Assessment / Reassessment - one wound 1 5 '[]'$  - 0 Complex Wound Assessment / Reassessment - multiple wounds '[]'$  - 0 Dermatologic / Skin Assessment (not related to wound area) ASSESSMENTS - Focused Assessment '[]'$  - 0 Circumferential Edema Measurements - multi extremities '[]'$  - 0 Nutritional Assessment / Counseling / Intervention '[]'$  - 0 Lower Extremity Assessment (monofilament, tuning fork, pulses) Natalie, Inaya [name] ([id number]) [id number] Page 2 of 8 '[]'$  - 0 Peripheral Arterial Disease Assessment (using hand held doppler) ASSESSMENTS - Ostomy and/or Continence Assessment and Care '[]'$  - 0 Incontinence Assessment and Management '[]'$  - 0 Ostomy Care Assessment and Management (repouching, etc.) PROCESS - Coordination of Care X - Simple Patient / Family Education for ongoing care 1 15 '[]'$  - 0 Complex (extensive) Patient / Family Education for ongoing care X- 1 10 Staff obtains Programmer, systems, Records, T Results / Process Orders est '[]'$  - 0 Staff telephones HHA, Nursing Homes / Clarify orders / etc '[]'$  - 0 Routine Transfer to another Facility (non-emergent condition) '[]'$  - 0 Routine Hospital Admission (non-emergent condition) '[]'$  - 0 New Admissions / Biomedical engineer / Ordering NPWT Apligraf, etc. , '[]'$  - 0 Emergency Hospital Admission (emergent condition) X- 1 10 Simple Discharge Coordination '[]'$  - 0 Complex (extensive) Discharge Coordination PROCESS - Special Needs '[]'$  - 0 Pediatric / Minor Patient Management '[]'$  - 0 Isolation Patient Management '[]'$  - 0 Hearing / Language / Visual special needs '[]'$  - 0 Assessment of Community assistance (transportation, D/C planning, etc.) '[]'$  - 0 Additional  assistance / Altered mentation '[]'$  - 0 Support Surface(s) Assessment (bed, cushion, seat, etc.) INTERVENTIONS - Wound Cleansing / Measurement X - Simple Wound Cleansing - one wound 1 5 '[]'$  - 0 Complex  Wound Cleansing - multiple wounds X- 1 5 Wound Imaging (photographs - any number of wounds) '[]'$  - 0 Wound Tracing (instead of photographs) X- 1 5 Simple Wound Measurement - one wound '[]'$  - 0 Complex Wound Measurement - multiple wounds INTERVENTIONS - Wound Dressings X - Small Wound Dressing one or multiple wounds 1 10 '[]'$  - 0 Medium Wound Dressing one or multiple wounds '[]'$  - 0 Large Wound Dressing one or multiple wounds '[]'$  - 0 Application of Medications - topical '[]'$  - 0 Application of Medications - injection INTERVENTIONS - Miscellaneous '[]'$  - 0 External ear exam '[]'$  - 0 Specimen Collection (cultures, biopsies, blood, body fluids, etc.) '[]'$  - 0 Specimen(s) / Culture(s) sent or taken to Lab for analysis '[]'$  - 0 Patient Transfer (multiple staff / Civil Service fast streamer / Similar devices) '[]'$  - 0 Simple Staple / Suture removal (25 or less) '[]'$  - 0 Complex Staple / Suture removal (26 or more) '[]'$  - 0 Hypo / Hyperglycemic Management (close monitor of Blood Glucose) '[]'$  - 0 Ankle / Brachial Index (ABI) - do not check if billed separately Natalie, CAMPOY [name] ([id number]) [id number] Page 3 of 8 X- 1 5 Vital Signs Has the patient been seen at the hospital within the last three years: Yes Total Score: 85 Level Of Care: New/Established - Level 3 Electronic Signature(s) Signed: 02/04/2022 3:28:23 PM By: Deon Pilling RN, BSN Entered By: Deon Pilling on 02/04/2022 10:31:57 -------------------------------------------------------------------------------- Encounter Discharge Information Details Patient Name: Date of Service: Natalie Feil [name]. 02/04/2022 10:00 [name] Medical Record Number: [id number] Patient Account Number: [id number] Date of Birth/Sex: Treating RN: [birth year] (51 y.o.  [name] Primary Care Lidiya Reise: Penni Homans Other Clinician: Referring Monaca Wadas: Treating Phoenyx Paulsen/Extender: Francoise Ceo in Treatment: 5 Encounter Discharge Information Items Discharge Condition: Stable Ambulatory Status: Ambulatory Discharge Destination: Home Transportation: Private Auto Accompanied By: self Schedule Follow-up Appointment: Yes Clinical Summary of Care: Electronic Signature(s) Signed: 02/04/2022 3:28:23 PM By: Deon Pilling RN, BSN Entered By: Deon Pilling on 02/04/2022 10:32:24 -------------------------------------------------------------------------------- Lower Extremity Assessment Details Patient Name: Date of Service: Natalie Feil [name]. 02/04/2022 10:00 [name] Medical Record Number: [id number] Patient Account Number: [id number] Date of Birth/Sex: Treating RN: 05/17/1970 (51 y.o. F) Primary Care Berania Peedin: Penni Homans Other Clinician: Referring Michelena Culmer: Treating Teodoro Jeffreys/Extender: Grayling Congress Weeks in Treatment: 5 Electronic Signature(s) Signed: 02/09/2022 12:00:46 PM By: Erenest Blank Entered By: Erenest Blank on 02/04/2022 10:12:23 -------------------------------------------------------------------------------- Multi Wound Chart Details Patient Name: Date of Service: Natalie Feil [name]. 02/04/2022 10:00 [name] Medical Record Number: [id number] Patient Account Number: [id number] Date of Birth/Sex: Treating RN: 09/19/1970 (51 y.o. Yuvia Plant, [name] [name] ([id number]) [id number] Page 4 of 8 Primary Care Herndon Grill: Penni Homans Other Clinician: Referring Eura Radabaugh: Treating Kipton Skillen/Extender: Grayling Congress Weeks in Treatment: 5 Vital Signs Height(in): 64 Pulse(bpm): 75 Weight(lbs): 219 Blood Pressure(mmHg): 117/81 Body Mass Index(BMI): 37.6 Temperature(F): 98.1 Respiratory Rate(breaths/min): 18 [1:Photos:] [N/[name]:N/[name]] Right Breast N/[name] N/[name] Wound  Location: Surgical Injury N/[name] N/[name] Wounding Event: Open Surgical Wound N/[name] N/[name] Primary Etiology: Sleep Apnea, Hypertension N/[name] N/[name] Comorbid History: 12/13/2021 N/[name] N/[name] Date Acquired: 5 N/[name] N/[name] Weeks of Treatment: Open N/[name] N/[name] Wound Status: No N/[name] N/[name] Wound Recurrence: 0.3x0.7x0.1 N/[name] N/[name] Measurements L x W x D (cm) 0.165 N/[name] N/[name] [name] (cm) : rea 0.016 N/[name] N/[name] Volume (cm) : 98.60% N/[name] N/[name] % Reduction in [name] rea: 99.50% N/[name] N/[name] % Reduction in Volume: Full Thickness With Exposed Support N/[name] N/[name] Classification: Structures Medium N/[name] N/[name] Exudate  Amount: Serosanguineous N/[name] N/[name] Exudate Type: red, brown N/[name] N/[name] Exudate Color: Distinct, outline attached N/[name] N/[name] Wound Margin: Large (67-100%) N/[name] N/[name] Granulation Amount: Red, Friable N/[name] N/[name] Granulation Quality: None Present (0%) N/[name] N/[name] Necrotic Amount: Fat Layer (Subcutaneous Tissue): Yes N/[name] N/[name] Exposed Structures: Fascia: No Tendon: No Muscle: No Joint: No Bone: No Small (1-33%) N/[name] N/[name] Epithelialization: Scarring: Yes N/[name] N/[name] Periwound Skin Texture: No Abnormalities Noted N/[name] N/[name] Periwound Skin Moisture: No Abnormalities Noted N/[name] N/[name] Periwound Skin Color: No Abnormality N/[name] N/[name] Temperature: Treatment Notes Wound #1 (Breast) Wound Laterality: Right Cleanser Soap and Water Discharge Instruction: May shower and wash wound with dial antibacterial soap and water prior to dressing change. Wound Cleanser Discharge Instruction: Cleanse the wound with wound cleanser prior to applying [name] clean dressing using gauze sponges, not tissue or cotton balls. Peri-Wound Care Topical bacitracin Discharge Instruction: purchase on the counter. apply it daily. Primary Dressing Secondary Dressing Zetuvit Plus Silicone Border Dressing 4x4 (in/in) Discharge Instruction: Apply silicone border over primary dressing as directed. MAKAYLA, LANTER [name] ([id number]) [id number] Page 5 of 8 Secured  With Compression Wrap Compression Stockings Add-Ons Electronic Signature(s) Signed: 02/04/2022 12:38:41 PM By: Kalman Shan DO Entered By: Kalman Shan on 02/04/2022 11:27:33 -------------------------------------------------------------------------------- Multi-Disciplinary Care Plan Details Patient Name: Date of Service: Natalie Feil [name]. 02/04/2022 10:00 [name] Medical Record Number: [id number] Patient Account Number: [id number] Date of Birth/Sex: Treating RN: May 17, 1970 (51 y.o. [name], [name] Primary Care Rozella Servello: Penni Homans Other Clinician: Referring Marquise Lambson: Treating Darcie Mellone/Extender: Grayling Congress Weeks in Treatment: 5 Active Inactive Necrotic Tissue Nursing Diagnoses: Impaired tissue integrity related to necrotic/devitalized tissue Knowledge deficit related to management of necrotic/devitalized tissue Goals: Necrotic/devitalized tissue will be minimized in the wound bed Date Initiated: 12/30/2021 Target Resolution Date: 03/04/2022 Goal Status: Active Patient/caregiver will verbalize understanding of reason and process for debridement of necrotic tissue Date Initiated: 12/30/2021 Target Resolution Date: 03/04/2022 Goal Status: Active Interventions: Assess patient pain level pre-, during and post procedure and prior to discharge Provide education on necrotic tissue and debridement process Treatment Activities: Apply topical anesthetic as ordered : 12/30/2021 Notes: Wound/Skin Impairment Nursing Diagnoses: Impaired tissue integrity Knowledge deficit related to ulceration/compromised skin integrity Goals: Patient/caregiver will verbalize understanding of skin care regimen Date Initiated: 12/30/2021 Target Resolution Date: 03/04/2022 Goal Status: Active Interventions: Assess ulceration(s) every visit Treatment Activities: Skin care regimen initiated : 12/30/2021 Topical wound management initiated : 12/30/2021 Notes: LATEYA, DAURIA ([id number])  [id number] Page 6 of 8 Electronic Signature(s) Signed: 02/04/2022 3:28:23 PM By: Deon Pilling RN, BSN Entered By: Deon Pilling on 02/04/2022 10:20:27 -------------------------------------------------------------------------------- Pain Assessment Details Patient Name: Date of Service: Natalie Feil [name]. 02/04/2022 10:00 [name] Medical Record Number: [id number] Patient Account Number: [id number] Date of Birth/Sex: Treating RN: 03-Jun-1970 (51 y.o. F) Primary Care Mory Herrman: Penni Homans Other Clinician: Referring Needham Biggins: Treating Yisrael Obryan/Extender: Grayling Congress Weeks in Treatment: 5 Active Problems Location of Pain Severity and Description of Pain Patient Has Paino No Site Locations Pain Management and Medication Current Pain Management: Electronic Signature(s) Signed: 02/09/2022 12:00:46 PM By: Erenest Blank Entered By: Erenest Blank on 02/04/2022 10:08:38 -------------------------------------------------------------------------------- Patient/Caregiver Education Details Patient Name: Date of Service: [name] 12/8/2023andnbsp10:00 [name] Medical Record Number: [id number] Patient Account Number: [id number] Date of Birth/Gender: Treating RN: 01-Mar-1970 (51 y.o. [name] Primary Care Physician: Penni Homans Other Clinician: Referring Physician: Treating Physician/Extender: Francoise Ceo in Treatment: 5 Education Assessment Education Provided To: Patient GWYNNE, KEMNITZ ([id number])  [id number] Page 7 of 8 Education Topics Provided Wound/Skin Impairment: Handouts: Skin Care Do's and Dont's Methods: Explain/Verbal Responses: Reinforcements needed Electronic Signature(s) Signed: 02/04/2022 3:28:23 PM By: Deon Pilling RN, BSN Entered By: Deon Pilling on 02/04/2022 10:20:48 -------------------------------------------------------------------------------- Wound  Assessment Details Patient Name: Date of Service: Natalie Feil [name]. 02/04/2022 10:00 [name] Medical Record Number: [id number] Patient Account Number: [id number] Date of Birth/Sex: Treating RN: [birth year] (51 y.o. F) Primary Care Aliayah Tyer: Penni Homans Other Clinician: Referring Dorotha Hirschi: Treating Agam Tuohy/Extender: Grayling Congress Weeks in Treatment: 5 Wound Status Wound Number: 1 Primary Etiology: Open Surgical Wound Wound Location: Right Breast Wound Status: Open Wounding Event: Surgical Injury Comorbid History: Sleep Apnea, Hypertension Date Acquired: 12/13/2021 Weeks Of Treatment: 5 Clustered Wound: No Photos Wound Measurements Length: (cm) 0.3 Width: (cm) 0.7 Depth: (cm) 0.1 Area: (cm) 0.165 Volume: (cm) 0.016 % Reduction in Area: 98.6% % Reduction in Volume: 99.5% Epithelialization: Small (1-33%) Tunneling: No Undermining: No Wound Description Classification: Full Thickness With Exposed Suppo Wound Margin: Distinct, outline attached Exudate Amount: Medium Exudate Type: Serosanguineous Exudate Color: red, brown rt Structures Foul Odor After Cleansing: No Slough/Fibrino Yes Wound Bed Granulation Amount: Large (67-100%) Exposed Structure Granulation Quality: Red, Friable Fascia Exposed: No Necrotic Amount: None Present (0%) Fat Layer (Subcutaneous Tissue) Exposed: Yes Tendon Exposed: No Muscle Exposed: No Joint Exposed: No Bone Exposed: No Denzler, Teiara [name] ([id number]) [id number] Page 8 of 8 Periwound Skin Texture Texture Color No Abnormalities Noted: No No Abnormalities Noted: Yes Scarring: Yes Temperature / Pain Temperature: No Abnormality Moisture No Abnormalities Noted: Yes Treatment Notes Wound #1 (Breast) Wound Laterality: Right Cleanser Soap and Water Discharge Instruction: May shower and wash wound with dial antibacterial soap and water prior to dressing change. Wound Cleanser Discharge Instruction:  Cleanse the wound with wound cleanser prior to applying [name] clean dressing using gauze sponges, not tissue or cotton balls. Peri-Wound Care Topical bacitracin Discharge Instruction: purchase on the counter. apply it daily. Primary Dressing Secondary Dressing Zetuvit Plus Silicone Border Dressing 4x4 (in/in) Discharge Instruction: Apply silicone border over primary dressing as directed. Secured With Compression Wrap Compression Stockings Environmental education officer) Signed: 02/09/2022 12:00:46 PM By: Erenest Blank Entered By: Erenest Blank on 02/04/2022 10:13:54 -------------------------------------------------------------------------------- Vitals Details Patient Name: Date of Service: Natalie Feil [name]. 02/04/2022 10:00 [name] Medical Record Number: [id number] Patient Account Number: [id number] Date of Birth/Sex: Treating RN: 1970/08/19 (51 y.o. F) Primary Care Dontavis Tschantz: Penni Homans Other Clinician: Referring Calob Baskette: Treating Kyri Dai/Extender: Grayling Congress Weeks in Treatment: 5 Vital Signs Time Taken: 10:08 Temperature (F): 98.1 Height (in): 64 Pulse (bpm): 75 Weight (lbs): 219 Respiratory Rate (breaths/min): 18 Body Mass Index (BMI): 37.6 Blood Pressure (mmHg): 117/81 Reference Range: 80 - 120 mg / dl Electronic Signature(s) Signed: 02/09/2022 12:00:46 PM By: Erenest Blank Entered By: Erenest Blank on 02/04/2022 10:08:29

## 2022-02-17 ENCOUNTER — Encounter (HOSPITAL_BASED_OUTPATIENT_CLINIC_OR_DEPARTMENT_OTHER): Payer: 59 | Admitting: Internal Medicine

## 2022-02-17 DIAGNOSIS — T8131XD Disruption of external operation (surgical) wound, not elsewhere classified, subsequent encounter: Secondary | ICD-10-CM

## 2022-02-17 DIAGNOSIS — T8141XS Infection following a procedure, superficial incisional surgical site, sequela: Secondary | ICD-10-CM

## 2022-02-17 DIAGNOSIS — S21001D Unspecified open wound of right breast, subsequent encounter: Secondary | ICD-10-CM

## 2022-03-03 NOTE — Progress Notes (Signed)
Natalie, KITTEL [name] ([id number]) [id number] Page 1 of 6 Visit Report for 02/17/2022 Chief Complaint Document Details Patient Name: Date of Service: [name], [name] 02/17/2022 10:00 [name] Medical Record Number: [id number] Patient Account Number: [id number] Date of Birth/Sex: Treating RN: March 17, 1970 (52 y.o. F) Primary Care Provider: Penni Homans Other Clinician: Referring Provider: Treating Provider/Extender: Francoise Ceo in Treatment: 7 Information Obtained from: Patient Chief Complaint 12/30/2021; patient is here for review of [name] right breast wound Electronic Signature(s) Signed: 02/17/2022 11:15:03 AM By: Kalman Shan DO Entered By: Kalman Shan on 02/17/2022 10:25:29 -------------------------------------------------------------------------------- HPI Details Patient Name: Date of Service: [name]. 02/17/2022 10:00 [name] Medical Record Number: [id number] Patient Account Number: [id number] Date of Birth/Sex: Treating RN: August 07, 1970 (52 y.o. F) Primary Care Provider: Penni Homans Other Clinician: Referring Provider: Treating Provider/Extender: Francoise Ceo in Treatment: 7 History of Present Illness HPI Description: ADMISSION 12/30/2021 This is [name] 52 year old woman who is here for review of [name] wound on her right breast. She had bilateral breast reduction surgery on 11/24/2021 by Dr. Marla Roe and since has been followed in our office. It is [name] bit difficult to determine exactly when the wound opened but the patient states it was very soon after surgery sees says that the area was Steri-Stripped but there was fluid leaking through and she has since developed an open wound. She has had this debrided and most recently cultured showing Pseudomonas and E. coli she was given [name] course of ciprofloxacin which she completed earlier this week. She had been using Vaseline gauze was switched to collagen. She  has not started this yet she has simply been using the Vaseline gauze. 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 She has not been systemically unwell. She has some pain in the left breast incision but has no open wound here. 11/14; patient presents for follow-up. She has been using gentamicin ointment with silver alginate to the wound bed. Into the tunneled area. She states the tunneled area has closed and she is noted no drainage. She completed her course of ciprofloxacin that was also prescribed at last clinic visit. She had [name] wound culture done that showed no significant bacterial growth. 11/21; patient presents for follow-up. She has been using gentamicin ointment with silver alginate to the wound bed. She has no issues or complaints today. She reports minimal drainage. She denies signs of infection. 12/1; patient presents for follow-up. She has been using normal collagen to the wound bed. There is been improvement in wound healing. She has no issues or complaints today. 12/8; patient presents for follow-up. She has been using collagen to the wound bed. She states that recently she has not been dressing this because the size is very small. She has no issues or complaints today. 12/21; patient presents for follow-up. She has been using antibiotic ointment to the wound bed. This is healed. She has no issues or complaints today. Electronic Signature(s) Signed: 02/17/2022 11:15:03 AM By: Darliss Ridgel [name] ([id number]) AM By: Kalman Shan DO [phone].pdf Page 2 of 6 Signed: 02/17/2022 11:15:03 Entered By: Kalman Shan on 02/17/2022 10:25:50 -------------------------------------------------------------------------------- Physical Exam Details Patient Name: Date of Service: [name], [name]. 02/17/2022 10:00 [name] Medical Record Number: [id number] Patient Account Number: [id number] Date of Birth/Sex: Treating  RN: 04-Jan-1971 (52 y.o. F) Primary Care Provider: Penni Homans Other Clinician: Referring Provider: Treating Provider/Extender: Grayling Congress Weeks in  Treatment: 7 Constitutional respirations regular, non-labored and within target range for patient.. Cardiovascular 2+ dorsalis pedis/posterior tibialis pulses. Psychiatric pleasant and cooperative. Notes Right breast: Below the breast there is epithelization to the previous wound site. No drainage noted. No fluctuance or induration noted on palpation. Electronic Signature(s) Signed: 02/17/2022 11:15:03 AM By: Kalman Shan DO Entered By: Kalman Shan on 02/17/2022 10:26:34 -------------------------------------------------------------------------------- Physician Orders Details Patient Name: Date of Service: [name]. 02/17/2022 10:00 [name] Medical Record Number: [id number] Patient Account Number: [id number] Date of Birth/Sex: Treating RN: 1970/04/14 (52 y.o. Tonita Phoenix, Lauren Primary Care Provider: Penni Homans Other Clinician: Referring Provider: Treating Provider/Extender: Francoise Ceo in Treatment: 7 Verbal / Phone Orders: No Diagnosis Coding Discharge From [facility] Discharge from [facility] Signature(s) Signed: 02/17/2022 11:15:03 AM By: Kalman Shan DO Entered By: Kalman Shan on 02/17/2022 10:26:40 -------------------------------------------------------------------------------- Problem List Details Patient Name: Date of Service: [name]. 02/17/2022 10:00 [name], [name] ([id number]) [phone].pdf Page 3 of 6 Medical Record Number: [id number] Patient Account Number: [id number] Date of Birth/Sex: Treating RN: 03-03-1970 (52 y.o. F) Primary Care Provider: Penni Homans Other Clinician: Referring Provider: Treating Provider/Extender: Francoise Ceo in Treatment:  7 Active Problems ICD-10 Encounter Code Description Active Date MDM Diagnosis T81.31XD Disruption of external operation (surgical) wound, not elsewhere classified, 12/30/2021 No Yes subsequent encounter S21.001D Unspecified open wound of right breast, subsequent encounter 12/30/2021 No Yes T81.41XS Infection following [name] procedure, superficial incisional surgical site, sequela 12/30/2021 No Yes Inactive Problems Resolved Problems Electronic Signature(s) Signed: 02/17/2022 11:15:03 AM By: Kalman Shan DO Entered By: Kalman Shan on 02/17/2022 10:25:14 -------------------------------------------------------------------------------- Progress Note Details Patient Name: Date of Service: [name]. 02/17/2022 10:00 [name] Medical Record Number: [id number] Patient Account Number: [id number] Date of Birth/Sex: Treating RN: [birth year] (52 y.o. F) Primary Care Provider: Penni Homans Other Clinician: Referring Provider: Treating Provider/Extender: Francoise Ceo in Treatment: 7 Subjective Chief Complaint Information obtained from Patient 12/30/2021; patient is here for review of [name] right breast wound History of Present Illness (HPI) ADMISSION 12/30/2021 This is [name] 52 year old woman who is here for review of [name] wound on her right breast. She had bilateral breast reduction surgery on 11/24/2021 by Dr. Marla Roe and since has been followed in our office. It is [name] bit difficult to determine exactly when the wound opened but the patient states it was very soon after surgery sees says that the area was Steri-Stripped but there was fluid leaking through and she has since developed an open wound. She has had this debrided and most recently cultured showing Pseudomonas and E. coli she was given [name] course of ciprofloxacin which she completed earlier this week. She had been using Vaseline gauze was switched to collagen. She has not started this yet she has simply been using  the Vaseline gauze. 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 She has not been systemically unwell. She has some pain in the left breast incision but has no open wound here. 11/14; patient presents for follow-up. She has been using gentamicin ointment with silver alginate to the wound bed. Into the tunneled area. She states the tunneled area has closed and she is noted no drainage. She completed her course of ciprofloxacin that was also prescribed at last clinic visit. She had [name] wound culture done that showed no significant bacterial growth. 11/21; patient presents for follow-up. She has been  using gentamicin ointment with silver alginate to the wound bed. She has no issues or complaints today. She reports minimal drainage. She denies signs of infection. 12/1; patient presents for follow-up. She has been using normal collagen to the wound bed. There is been improvement in wound healing. She has no issues or [name], [name] ([id number]) [id number] Page 4 of 6 complaints today. 12/8; patient presents for follow-up. She has been using collagen to the wound bed. She states that recently she has not been dressing this because the size is very small. She has no issues or complaints today. 12/21; patient presents for follow-up. She has been using antibiotic ointment to the wound bed. This is healed. She has no issues or complaints today. Patient History Information obtained from Patient. Family History Cancer - Mother,Father,Maternal Grandparents, Heart Disease - Father,Mother, Hypertension - Mother,Father, No family history of Diabetes, Hereditary Spherocytosis, Kidney Disease, Lung Disease, Seizures, Stroke, Thyroid Problems, Tuberculosis. Social History Never smoker, Marital Status - Married, Alcohol Use - Never, Drug Use - No History, Caffeine Use - Daily. Medical History Respiratory Patient has history of Sleep  Apnea Cardiovascular Patient has history of Hypertension Endocrine Denies history of Type I Diabetes, Type II Diabetes Hospitalization/Surgery History - breast reduction. - bilateral knee replacements. - hysterectomy. - hernia repair. - appendectomy. Medical [name] Surgical History Notes nd Ear/Nose/Mouth/Throat TMJ Gastrointestinal celiac disease Oncologic skin cancer hx Objective Constitutional respirations regular, non-labored and within target range for patient.. Vitals Time Taken: 9:58 AM, Height: 64 in, Weight: 219 lbs, BMI: 37.6, Temperature: 98.3 F, Pulse: 80 bpm, Respiratory Rate: 18 breaths/min, Blood Pressure: 121/84 mmHg. Cardiovascular 2+ dorsalis pedis/posterior tibialis pulses. Psychiatric pleasant and cooperative. General Notes: Right breast: Below the breast there is epithelization to the previous wound site. No drainage noted. No fluctuance or induration noted on palpation. Integumentary (Hair, Skin) Wound #1 status is Healed - Epithelialized. Original cause of wound was Surgical Injury. The date acquired was: 12/13/2021. The wound has been in treatment 7 weeks. The wound is located on the Right Breast. The wound measures 0cm length x 0cm width x 0cm depth; 0cm^2 area and 0cm^3 volume. There is no tunneling or undermining noted. There is [name] medium amount of serosanguineous drainage noted. The wound margin is distinct with the outline attached to the wound base. There is no granulation within the wound bed. There is no necrotic tissue within the wound bed. The periwound skin appearance had no abnormalities noted for moisture. The periwound skin appearance had no abnormalities noted for color. The periwound skin appearance did not exhibit: Callus, Crepitus, Excoriation, Induration, Rash, Scarring. Periwound temperature was noted as No Abnormality. Assessment Active Problems ICD-10 Disruption of external operation (surgical) wound, not elsewhere classified, subsequent  encounter Unspecified open wound of right breast, subsequent encounter Infection following [name] procedure, superficial incisional surgical site, sequela [name], [name] ([id number]) [phone].pdf Page 5 of 6 Patient's wound has done well with antibiotic ointment. It has healed. She knows to call with any questions or concerns. Follow-up as needed. Plan Discharge From [facility] Services: Discharge from [place] 1. Discharge from clinic due to closed wound 2. Follow-up as needed Electronic Signature(s) Signed: 02/17/2022 11:15:03 AM By: Kalman Shan DO Entered By: Kalman Shan on 02/17/2022 10:27:15 -------------------------------------------------------------------------------- HxROS Details Patient Name: Date of Service: [name]. 02/17/2022 10:00 [name] Medical Record Number: [id number] Patient Account Number: [id number] Date of Birth/Sex: Treating RN: 10-04-1970 (52 y.o. F) Primary Care Provider: Penni Homans Other Clinician: Referring Provider: Treating Provider/Extender:  Grayling Congress Weeks in Treatment: 7 Information Obtained From Patient Ear/Nose/Mouth/Throat Medical History: Past Medical History Notes: TMJ Respiratory Medical History: Positive for: Sleep Apnea Cardiovascular Medical History: Positive for: Hypertension Gastrointestinal Medical History: Past Medical History Notes: celiac disease Endocrine Medical History: Negative for: Type I Diabetes; Type II Diabetes Oncologic Medical History: Past Medical History Notes: skin cancer hx Immunizations Pneumococcal Vaccine: Received Pneumococcal Vaccination: No Implantable Devices [name], [name] ([id number]) [phone].pdf Page 6 of 6 None Hospitalization / Surgery History Type of Hospitalization/Surgery breast reduction bilateral knee replacements hysterectomy hernia repair appendectomy Family and Social  History Cancer: Yes - Mother,Father,Maternal Grandparents; Diabetes: No; Heart Disease: Yes - Father,Mother; Hereditary Spherocytosis: No; Hypertension: Yes - Mother,Father; Kidney Disease: No; Lung Disease: No; Seizures: No; Stroke: No; Thyroid Problems: No; Tuberculosis: No; Never smoker; Marital Status - Married; Alcohol Use: Never; Drug Use: No History; Caffeine Use: Daily; Financial Concerns: No; Food, Clothing or Shelter Needs: No; Support System Lacking: No; Transportation Concerns: No Electronic Signature(s) Signed: 02/17/2022 11:15:03 AM By: Kalman Shan DO Entered By: Kalman Shan on 02/17/2022 10:25:56 -------------------------------------------------------------------------------- SuperBill Details Patient Name: Date of Service: [name]. 02/17/2022 Medical Record Number: [id number] Patient Account Number: [id number] Date of Birth/Sex: Treating RN: 09/08/1970 (52 y.o. F) Primary Care Provider: Penni Homans Other Clinician: Referring Provider: Treating Provider/Extender: Grayling Congress Weeks in Treatment: 7 Diagnosis Coding ICD-10 Codes Code Description T81.31XD Disruption of external operation (surgical) wound, not elsewhere classified, subsequent encounter S21.001D Unspecified open wound of right breast, subsequent encounter T81.41XS Infection following [name] procedure, superficial incisional surgical site, sequela Facility Procedures : CPT4 Code: [id number] Description: 99213 - WOUND CARE VISIT-LEV 3 EST PT Modifier: Quantity: 1 Physician Procedures : CPT4 Code Description Modifier [id number] - WC PHYS LEVEL 3 - EST PT ICD-10 Diagnosis Description T81.31XD Disruption of external operation (surgical) wound, not elsewhere classified, subsequent encounter S21.001D Unspecified open wound of right  breast, subsequent encounter T81.41XS Infection following [name] procedure, superficial incisional surgical site, sequela Quantity: 1 Electronic  Signature(s) Signed: 02/17/2022 11:15:03 AM By: Kalman Shan DO Signed: 03/02/2022 5:35:40 PM By: Rhae Hammock RN Entered By: Rhae Hammock on 02/17/2022 11:01:42

## 2022-03-03 NOTE — Progress Notes (Signed)
[name], SCHIER Natalie ([id number]) [id number] Page 1 of 8 Visit Report for 02/17/2022 Arrival Information Details Patient Name: Date of Service: Natalie Natalie, Natalie Natalie 02/17/2022 10:00 Natalie Natalie Medical Record Number: [id number] Patient Account Number: [id number] Date of Birth/Sex: Treating RN: [birth year] (52 y.o. F) Primary Care Natalie Natalie: Natalie Natalie Other Clinician: Referring Natalie Natalie: Treating Natalie Natalie/Extender: Natalie Natalie in Treatment: 7 Visit Information History Since Last Visit Added or deleted any medications: No Patient Arrived: Ambulatory Any new allergies or adverse reactions: No Arrival Time: 09:57 Had Natalie fall or experienced change in No Accompanied By: self activities of daily living that may affect Transfer Assistance: None risk of falls: Patient Identification Verified: Yes Signs or symptoms of abuse/neglect since last visito No Secondary Verification Process Completed: Yes Hospitalized since last visit: No Patient Requires Transmission-Based Precautions: No Implantable device outside of the clinic excluding No Patient Has Alerts: No cellular tissue based products placed in the center since last visit: Has Dressing in Place as Prescribed: No Pain Present Now: No Electronic Signature(s) Signed: 02/18/2022 8:46:25 AM By: Natalie Natalie Entered By: Natalie Natalie on 02/17/2022 09:58:09 -------------------------------------------------------------------------------- Clinic Level of Care Assessment Details Patient Name: Date of Service: Natalie Natalie, Natalie Natalie. 02/17/2022 10:00 Natalie Natalie Medical Record Number: [id number] Patient Account Number: [id number] Date of Birth/Sex: Treating RN: 10/15/1970 (52 y.o. Natalie Natalie, [name] Primary Care Lamarcus Spira: Natalie Natalie Other Clinician: Referring Natalie Natalie: Treating Natalie Natalie/Extender: Natalie Natalie in Treatment: 7 Clinic Level of Care Assessment Items TOOL 4 Quantity  Score X- 1 0 Use when only an EandM is performed on FOLLOW-UP visit ASSESSMENTS - Nursing Assessment / Reassessment X- 1 10 Reassessment of Co-morbidities (includes updates in patient status) X- 1 5 Reassessment of Adherence to Treatment Plan ASSESSMENTS - Wound and Skin Natalie ssessment / Reassessment X - Simple Wound Assessment / Reassessment - one wound 1 5 '[]'$  - 0 Complex Wound Assessment / Reassessment - multiple wounds '[]'$  - 0 Dermatologic / Skin Assessment (not related to wound area) ASSESSMENTS - Focused Assessment '[]'$  - 0 Circumferential Edema Measurements - multi extremities '[]'$  - 0 Nutritional Assessment / Counseling / Intervention Natalie, [name] Natalie ([id number]) [id number] Page 2 of 8 '[]'$  - 0 Lower Extremity Assessment (monofilament, tuning fork, pulses) '[]'$  - 0 Peripheral Arterial Disease Assessment (using hand held doppler) ASSESSMENTS - Ostomy and/or Continence Assessment and Care '[]'$  - 0 Incontinence Assessment and Management '[]'$  - 0 Ostomy Care Assessment and Management (repouching, etc.) PROCESS - Coordination of Care X - Simple Patient / Family Education for ongoing care 1 15 '[]'$  - 0 Complex (extensive) Patient / Family Education for ongoing care X- 1 10 Staff obtains Programmer, systems, Records, T Results / Process Orders est '[]'$  - 0 Staff telephones HHA, Nursing Homes / Clarify orders / etc '[]'$  - 0 Routine Transfer to another Facility (non-emergent condition) '[]'$  - 0 Routine Hospital Admission (non-emergent condition) '[]'$  - 0 New Admissions / Biomedical engineer / Ordering NPWT Apligraf, etc. , '[]'$  - 0 Emergency Hospital Admission (emergent condition) X- 1 10 Simple Discharge Coordination '[]'$  - 0 Complex (extensive) Discharge Coordination PROCESS - Special Needs '[]'$  - 0 Pediatric / Minor Patient Management '[]'$  - 0 Isolation Patient Management '[]'$  - 0 Hearing / Language / Visual special needs '[]'$  - 0 Assessment of Community assistance  (transportation, D/C planning, etc.) '[]'$  - 0 Additional assistance / Altered mentation '[]'$  - 0 Support Surface(s) Assessment (bed, cushion, seat, etc.) INTERVENTIONS - Wound Cleansing / Measurement X - Simple Wound Cleansing - one  wound 1 5 '[]'$  - 0 Complex Wound Cleansing - multiple wounds X- 1 5 Wound Imaging (photographs - any number of wounds) '[]'$  - 0 Wound Tracing (instead of photographs) X- 1 5 Simple Wound Measurement - one wound '[]'$  - 0 Complex Wound Measurement - multiple wounds INTERVENTIONS - Wound Dressings X - Small Wound Dressing one or multiple wounds 1 10 '[]'$  - 0 Medium Wound Dressing one or multiple wounds '[]'$  - 0 Large Wound Dressing one or multiple wounds '[]'$  - 0 Application of Medications - topical '[]'$  - 0 Application of Medications - injection INTERVENTIONS - Miscellaneous '[]'$  - 0 External ear exam '[]'$  - 0 Specimen Collection (cultures, biopsies, blood, body fluids, etc.) '[]'$  - 0 Specimen(s) / Culture(s) sent or taken to Lab for analysis '[]'$  - 0 Patient Transfer (multiple staff / Civil Service fast streamer / Similar devices) '[]'$  - 0 Simple Staple / Suture removal (25 or less) '[]'$  - 0 Complex Staple / Suture removal (26 or more) '[]'$  - 0 Hypo / Hyperglycemic Management (close monitor of Blood Glucose) Natalie Natalie, Natalie Natalie ([id number]) [id number] Page 3 of 8 '[]'$  - 0 Ankle / Brachial Index (ABI) - do not check if billed separately X- 1 5 Vital Signs Has the patient been seen at the hospital within the last three years: Yes Total Score: 85 Level Of Care: New/Established - Level 3 Electronic Signature(s) Signed: 03/02/2022 5:35:40 PM By: Natalie Hammock RN Entered By: Natalie Natalie on 02/17/2022 11:01:33 -------------------------------------------------------------------------------- Encounter Discharge Information Details Patient Name: Date of Service: Natalie Natalie. 02/17/2022 10:00 Natalie Natalie Medical Record Number: [id number] Patient Account Number:  [id number] Date of Birth/Sex: Treating RN: 09-07-1970 (52 y.o. Natalie Natalie, [name] Primary Care Ralphie Lovelady: Natalie Natalie Other Clinician: Referring Markea Ruzich: Treating Julian Askin/Extender: Natalie Natalie in Treatment: 7 Encounter Discharge Information Items Discharge Condition: Stable Ambulatory Status: Wheelchair Discharge Destination: Home Transportation: Private Auto Accompanied By: self Schedule Follow-up Appointment: Yes Clinical Summary of Care: Patient Declined Electronic Signature(s) Signed: 03/02/2022 5:35:40 PM By: Natalie Hammock RN Entered By: Natalie Natalie on 02/17/2022 11:02:19 -------------------------------------------------------------------------------- Lower Extremity Assessment Details Patient Name: Date of Service: Natalie Natalie. 02/17/2022 10:00 Natalie Natalie Medical Record Number: [id number] Patient Account Number: [id number] Date of Birth/Sex: Treating RN: October 14, 1970 (52 y.o. F) Primary Care Shalayah Beagley: Natalie Natalie Other Clinician: Referring Eura Mccauslin: Treating Nayvie Lips/Extender: Grayling Congress Weeks in Treatment: 7 Electronic Signature(s) Signed: 02/18/2022 8:46:25 AM By: Natalie Natalie Entered By: Natalie Natalie on 02/17/2022 10:01:48 -------------------------------------------------------------------------------- Multi Wound Chart Details Patient Name: Date of Service: Natalie Natalie. 02/17/2022 10:00 Natalie Natalie Medical Record Number: [id number] Patient Account Number: [id number] Natalie Natalie, Natalie Natalie ([id number]) [id number] Page 4 of 8 Date of Birth/Sex: Treating RN: May 22, 1970 (52 y.o. F) Primary Care Jalien Weakland: Other Clinician: Penni [name] Referring Nihal Doan: Treating Meloney Feld/Extender: Natalie Natalie in Treatment: 7 Vital Signs Height(in): 64 Pulse(bpm): 80 Weight(lbs): 219 Blood Pressure(mmHg): 121/84 Body Mass Index(BMI): 37.6 Temperature(F): 98.3 Respiratory  Rate(breaths/min): 18 [1:Photos:] [N/Natalie:N/Natalie] Right Breast N/Natalie N/Natalie Wound Location: Surgical Injury N/Natalie N/Natalie Wounding Event: Open Surgical Wound N/Natalie N/Natalie Primary Etiology: Sleep Apnea, Hypertension N/Natalie N/Natalie Comorbid History: 12/13/2021 N/Natalie N/Natalie Date Acquired: 7 N/Natalie N/Natalie Weeks of Treatment: Healed - Epithelialized N/Natalie N/Natalie Wound Status: No N/Natalie N/Natalie Wound Recurrence: 0x0x0 N/Natalie N/Natalie Measurements L x W x D (cm) 0 N/Natalie N/Natalie Natalie (cm) : rea 0 N/Natalie N/Natalie Volume (cm) : 100.00% N/Natalie N/Natalie % Reduction in Natalie rea: 100.00% N/Natalie N/Natalie % Reduction in Volume: Full Thickness With Exposed  Support N/Natalie N/Natalie Classification: Structures Medium N/Natalie N/Natalie Exudate Amount: Serosanguineous N/Natalie N/Natalie Exudate Type: red, brown N/Natalie N/Natalie Exudate Color: Distinct, outline attached N/Natalie N/Natalie Wound Margin: None Present (0%) N/Natalie N/Natalie Granulation Amount: None Present (0%) N/Natalie N/Natalie Necrotic Amount: Fascia: No N/Natalie N/Natalie Exposed Structures: Fat Layer (Subcutaneous Tissue): No Tendon: No Muscle: No Joint: No Bone: No Large (67-100%) N/Natalie N/Natalie Epithelialization: Excoriation: No N/Natalie N/Natalie Periwound Skin Texture: Induration: No Callus: No Crepitus: No Rash: No Scarring: No Maceration: No N/Natalie N/Natalie Periwound Skin Moisture: Dry/Scaly: No Atrophie Blanche: No N/Natalie N/Natalie Periwound Skin Color: Cyanosis: No Ecchymosis: No Erythema: No Hemosiderin Staining: No Mottled: No Pallor: No Rubor: No No Abnormality N/Natalie N/Natalie Temperature: Treatment Notes Electronic Signature(s) Signed: 02/17/2022 11:15:03 AM By: Kalman Shan DO Entered By: Kalman Shan on 02/17/2022 10:25:19 Natalie Natalie ([id number]) [id number] Page 5 of 8 -------------------------------------------------------------------------------- Multi-Disciplinary Care Plan Details Patient Name: Date of Service: Natalie Natalie, Natalie Natalie 02/17/2022 10:00 Natalie Natalie Medical Record Number: [id number] Patient Account Number: [id number] Date of Birth/Sex: Treating  RN: 11-29-1970 (52 y.o. Natalie Natalie, [name] Primary Care Disa Riedlinger: Natalie Natalie Other Clinician: Referring Angello Chien: Treating Sitara Cashwell/Extender: Grayling Congress Weeks in Treatment: 7 Active Inactive Electronic Signature(s) Signed: 03/02/2022 5:35:40 PM By: Natalie Hammock RN Entered By: Natalie Natalie on 02/17/2022 10:21:23 -------------------------------------------------------------------------------- Pain Assessment Details Patient Name: Date of Service: Natalie Natalie. 02/17/2022 10:00 Natalie Natalie Medical Record Number: [id number] Patient Account Number: [id number] Date of Birth/Sex: Treating RN: 1970-08-13 (52 y.o. F) Primary Care Mylah Baynes: Natalie Natalie Other Clinician: Referring Woodruff Skirvin: Treating Esterlene Atiyeh/Extender: Grayling Congress Weeks in Treatment: 7 Active Problems Location of Pain Severity and Description of Pain Patient Has Paino No Site Locations Pain Management and Medication Current Pain Management: Electronic Signature(s) Signed: 02/18/2022 8:46:25 AM By: Natalie Natalie Entered By: Natalie Natalie on 02/17/2022 10:01:37 Natalie Natalie ([id number]) [id number] Page 6 of 8 -------------------------------------------------------------------------------- Patient/Caregiver Education Details Patient Name: Date of Service: Natalie Natalie, Natalie Natalie 12/21/2023andnbsp10:00 Natalie Natalie Medical Record Number: [id number] Patient Account Number: [id number] Date of Birth/Gender: Treating RN: [birth year] (52 y.o. Natalie Natalie, [name] Primary Care Physician: Natalie Natalie Other Clinician: Referring Physician: Treating Physician/Extender: Natalie Natalie in Treatment: 7 Education Assessment Education Provided To: Patient Education Topics Provided Wound/Skin Impairment: Methods: Explain/Verbal Responses: Reinforcements needed, State content correctly Electronic Signature(s) Signed: 03/02/2022 5:35:40 PM By:  Natalie Hammock RN Entered By: Natalie Natalie on 02/17/2022 10:21:37 -------------------------------------------------------------------------------- Wound Assessment Details Patient Name: Date of Service: Natalie Natalie. 02/17/2022 10:00 Natalie Natalie Medical Record Number: [id number] Patient Account Number: [id number] Date of Birth/Sex: Treating RN: 1970/06/16 (52 y.o. Natalie Natalie, [name] Primary Care Refugio Vandevoorde: Natalie Natalie Other Clinician: Referring Kareen Hitsman: Treating Kelleigh Skerritt/Extender: Grayling Congress Weeks in Treatment: 7 Wound Status Wound Number: 1 Primary Etiology: Open Surgical Wound Wound Location: Right Breast Wound Status: Healed - Epithelialized Wounding Event: Surgical Injury Comorbid History: Sleep Apnea, Hypertension Date Acquired: 12/13/2021 Weeks Of Treatment: 7 Clustered Wound: No Photos Wound Measurements Length: (cm) Width: (cm) Natalie Natalie, Natalie Natalie ([id number]) Depth: (cm) Area: (cm) Volume: (cm) 0 % Reduction in Area: 100% 0 % Reduction in Volume: 100% [id number] Page 7 of 8 0 Epithelialization: Large (67-100%) 0 Tunneling: No 0 Undermining: No Wound Description Classification: Full Thickness With Exposed Suppor Wound Margin: Distinct, outline attached Exudate Amount: Medium Exudate Type: Serosanguineous Exudate Color: red, brown t Structures Foul Odor After Cleansing: No Slough/Fibrino Yes Wound Bed Granulation Amount: None Present (0%) Exposed Structure Necrotic Amount: None Present (0%) Fascia  Exposed: No Fat Layer (Subcutaneous Tissue) Exposed: No Tendon Exposed: No Muscle Exposed: No Joint Exposed: No Bone Exposed: No Periwound Skin Texture Texture Color No Abnormalities Noted: No No Abnormalities Noted: Yes Callus: No Temperature / Pain Crepitus: No Temperature: No Abnormality Excoriation: No Induration: No Rash: No Scarring: No Moisture No Abnormalities Noted: Yes Treatment  Notes Wound #1 (Breast) Wound Laterality: Right Cleanser Peri-Wound Care Topical Primary Dressing Secondary Dressing Secured With Compression Wrap Compression Stockings Add-Ons Electronic Signature(s) Signed: 03/02/2022 5:35:40 PM By: Natalie Hammock RN Entered By: Natalie Natalie on 02/17/2022 10:20:59 -------------------------------------------------------------------------------- Vitals Details Patient Name: Date of Service: Natalie Natalie. 02/17/2022 10:00 Natalie Natalie Medical Record Number: [id number] Patient Account Number: [id number] Date of Birth/Sex: Treating RN: 1970/11/30 (52 y.o. F) Primary Care Jonnae Fonseca: Natalie Natalie Other Clinician: Referring Nyla Creason: Treating Timiko Offutt/Extender: Grayling Congress Weeks in Treatment: 7 Vital Signs Natalie Natalie, Natalie Natalie ([id number]) [id number] Page 8 of 8 Time Taken: 09:58 Temperature (F): 98.3 Height (in): 64 Pulse (bpm): 80 Weight (lbs): 219 Respiratory Rate (breaths/min): 18 Body Mass Index (BMI): 37.6 Blood Pressure (mmHg): 121/84 Reference Range: 80 - 120 mg / dl Electronic Signature(s) Signed: 02/18/2022 8:46:25 AM By: Natalie Natalie Entered By: Natalie Natalie on 02/17/2022 10:01:21

## 2022-03-25 ENCOUNTER — Encounter: Payer: Self-pay | Admitting: Plastic Surgery

## 2022-03-25 ENCOUNTER — Ambulatory Visit: Payer: 59 | Admitting: Plastic Surgery

## 2022-03-25 VITALS — BP 106/76 | HR 83

## 2022-03-25 DIAGNOSIS — M545 Low back pain, unspecified: Secondary | ICD-10-CM | POA: Diagnosis not present

## 2022-03-25 DIAGNOSIS — M797 Fibromyalgia: Secondary | ICD-10-CM

## 2022-03-25 DIAGNOSIS — N62 Hypertrophy of breast: Secondary | ICD-10-CM | POA: Diagnosis not present

## 2022-03-25 NOTE — Progress Notes (Signed)
   Subjective:    Patient ID: [name], female    DOB: November 23, 1970, 52 y.o.   MRN: [id number]  The patient is a 52 year old female here for follow-up after undergoing a breast reduction September 2023.  She had over 1400 g removed from both breasts.  She is overall doing really well and happy with her results.  She notices that the right side is slightly larger.  She may want that revised.  She had some breakdown at the TV on the right side and that has healed and is doing really well.      Review of Systems  Constitutional: Negative.   Eyes: Negative.   Respiratory: Negative.    Cardiovascular: Negative.   Gastrointestinal: Negative.   Endocrine: Negative.   Genitourinary: Negative.        Objective:   Physical Exam Vitals reviewed.  Constitutional:      Appearance: Normal appearance.  Cardiovascular:     Rate and Rhythm: Normal rate.     Pulses: Normal pulses.  Pulmonary:     Effort: Pulmonary effort is normal.  Neurological:     Mental Status: She is alert and oriented to person, place, and time.  Psychiatric:        Mood and Affect: Mood normal.        Behavior: Behavior normal.        Thought Content: Thought content normal.        Judgment: Judgment normal.        Assessment & Plan:     ICD-10-CM   1. Low back pain, unspecified back pain laterality, unspecified chronicity, unspecified whether sciatica present  M54.50     2. Fibromyalgia  M79.7     3. Symptomatic mammary hypertrophy  N62       Would like to see her back in 6 months and we will evaluate whether or not revision needs to be done.  Will also try and do that in the office if possible I am thinking just to the medial aspect of the lower portion of the right breast.  Pictures were obtained of the patient and placed in the chart with the patient's or guardian's permission.

## 2022-06-13 ENCOUNTER — Other Ambulatory Visit: Payer: Self-pay

## 2022-06-13 ENCOUNTER — Telehealth: Payer: Self-pay | Admitting: Family Medicine

## 2022-06-13 DIAGNOSIS — R739 Hyperglycemia, unspecified: Secondary | ICD-10-CM

## 2022-06-13 DIAGNOSIS — I10 Essential (primary) hypertension: Secondary | ICD-10-CM

## 2022-06-13 DIAGNOSIS — E559 Vitamin D deficiency, unspecified: Secondary | ICD-10-CM

## 2022-06-13 NOTE — Assessment & Plan Note (Addendum)
Patient encouraged to maintain heart healthy diet, regular exercise, adequate sleep. Consider daily probiotics. Take medications as prescribed. Labs ordered and reviewed. Colonoscopy February 2022 repeat in 2032. Given and reviewed copy of ACP documents from [employer] and encouraged to complete and return  MGM Pap

## 2022-06-13 NOTE — Telephone Encounter (Signed)
Pt wants to come to the office to have her labs done tomorrow morning before her physical. Please place orders and call pt to schedule.

## 2022-06-13 NOTE — Assessment & Plan Note (Signed)
Well controlled, no changes to meds. Encouraged heart healthy diet such as the DASH diet and exercise as tolerated.  °

## 2022-06-13 NOTE — Assessment & Plan Note (Signed)
hgba1c acceptable, minimize simple carbs. Increase exercise as tolerated.  

## 2022-06-13 NOTE — Assessment & Plan Note (Signed)
Hydrate and monitor 

## 2022-06-13 NOTE — Assessment & Plan Note (Signed)
Encourage heart healthy diet such as MIND or DASH diet, increase exercise, avoid trans fats, simple carbohydrates and processed foods, consider a krill or fish or flaxseed oil cap daily.  °

## 2022-06-13 NOTE — Telephone Encounter (Signed)
Labs order placed.

## 2022-06-13 NOTE — Telephone Encounter (Signed)
Appt was made

## 2022-06-14 ENCOUNTER — Ambulatory Visit (INDEPENDENT_AMBULATORY_CARE_PROVIDER_SITE_OTHER): Payer: 59 | Admitting: Family Medicine

## 2022-06-14 ENCOUNTER — Encounter: Payer: Self-pay | Admitting: Family Medicine

## 2022-06-14 ENCOUNTER — Other Ambulatory Visit: Payer: 59

## 2022-06-14 VITALS — BP 128/80 | HR 90 | Temp 98.0°F | Resp 16 | Ht 64.0 in | Wt 223.8 lb

## 2022-06-14 DIAGNOSIS — E785 Hyperlipidemia, unspecified: Secondary | ICD-10-CM

## 2022-06-14 DIAGNOSIS — R252 Cramp and spasm: Secondary | ICD-10-CM | POA: Diagnosis not present

## 2022-06-14 DIAGNOSIS — R739 Hyperglycemia, unspecified: Secondary | ICD-10-CM | POA: Diagnosis not present

## 2022-06-14 DIAGNOSIS — I10 Essential (primary) hypertension: Secondary | ICD-10-CM | POA: Diagnosis not present

## 2022-06-14 DIAGNOSIS — E559 Vitamin D deficiency, unspecified: Secondary | ICD-10-CM | POA: Diagnosis not present

## 2022-06-14 DIAGNOSIS — M546 Pain in thoracic spine: Secondary | ICD-10-CM

## 2022-06-14 DIAGNOSIS — Z0000 Encounter for general adult medical examination without abnormal findings: Secondary | ICD-10-CM | POA: Diagnosis not present

## 2022-06-14 DIAGNOSIS — Z23 Encounter for immunization: Secondary | ICD-10-CM | POA: Diagnosis not present

## 2022-06-14 DIAGNOSIS — G8929 Other chronic pain: Secondary | ICD-10-CM

## 2022-06-14 LAB — LIPID PANEL
Cholesterol: 179 mg/dL (ref 0–200)
HDL: 48.6 mg/dL (ref >39.00)
LDL Cholesterol: 107 mg/dL — ABNORMAL HIGH (ref 0–99)
NonHDL: 130.22
Total CHOL/HDL Ratio: 4
Triglycerides: 118 mg/dL (ref 0.0–149.0)
VLDL: 23.6 mg/dL (ref 0.0–40.0)

## 2022-06-14 LAB — CBC WITH DIFFERENTIAL/PLATELET
Basophils Absolute: 0.1 10*3/uL (ref 0.0–0.1)
Basophils Relative: 0.8 % (ref 0.0–3.0)
Eosinophils Absolute: 0.3 10*3/uL (ref 0.0–0.7)
Eosinophils Relative: 4.4 % (ref 0.0–5.0)
HCT: 42.6 % (ref 36.0–46.0)
Hemoglobin: 14.8 g/dL (ref 12.0–15.0)
Lymphocytes Relative: 33.2 % (ref 12.0–46.0)
Lymphs Abs: 2.3 10*3/uL (ref 0.7–4.0)
MCHC: 34.8 g/dL (ref 30.0–36.0)
MCV: 86 fl (ref 78.0–100.0)
Monocytes Absolute: 0.4 10*3/uL (ref 0.1–1.0)
Monocytes Relative: 6 % (ref 3.0–12.0)
Neutro Abs: 3.8 10*3/uL (ref 1.4–7.7)
Neutrophils Relative %: 55.6 % (ref 43.0–77.0)
Platelets: 390 10*3/uL (ref 150.0–400.0)
RBC: 4.95 Mil/uL (ref 3.87–5.11)
RDW: 13.7 % (ref 11.5–15.5)
WBC: 6.9 10*3/uL (ref 4.0–10.5)

## 2022-06-14 LAB — VITAMIN D 25 HYDROXY (VIT D DEFICIENCY, FRACTURES): VITD: 39.65 ng/mL (ref 30.00–100.00)

## 2022-06-14 LAB — HEMOGLOBIN A1C: Hgb A1c MFr Bld: 5.7 % (ref 4.6–6.5)

## 2022-06-14 LAB — TSH: TSH: 1.12 u[IU]/mL (ref 0.35–5.50)

## 2022-06-14 NOTE — Patient Instructions (Signed)
Preventive Care 40-54 Years Old, Female Preventive care refers to lifestyle choices and visits with your health care provider that can promote health and wellness. Preventive care visits are also called wellness exams. What can I expect for my preventive care visit? Counseling Your health care provider may ask you questions about your: Medical history, including: Past medical problems. Family medical history. Pregnancy history. Current health, including: Menstrual cycle. Method of birth control. Emotional well-being. Home life and relationship well-being. Sexual activity and sexual health. Lifestyle, including: Alcohol, nicotine or tobacco, and drug use. Access to firearms. Diet, exercise, and sleep habits. Work and work environment. Sunscreen use. Safety issues such as seatbelt and bike helmet use. Physical exam Your health care provider will check your: Height and weight. These may be used to calculate your BMI (body mass index). BMI is a measurement that tells if you are at a healthy weight. Waist circumference. This measures the distance around your waistline. This measurement also tells if you are at a healthy weight and may help predict your risk of certain diseases, such as type 2 diabetes and high blood pressure. Heart rate and blood pressure. Body temperature. Skin for abnormal spots. What immunizations do I need?  Vaccines are usually given at various ages, according to a schedule. Your health care provider will recommend vaccines for you based on your age, medical history, and lifestyle or other factors, such as travel or where you work. What tests do I need? Screening Your health care provider may recommend screening tests for certain conditions. This may include: Lipid and cholesterol levels. Diabetes screening. This is done by checking your blood sugar (glucose) after you have not eaten for a while (fasting). Pelvic exam and Pap test. Hepatitis B test. Hepatitis C  test. HIV (human immunodeficiency virus) test. STI (sexually transmitted infection) testing, if you are at risk. Lung cancer screening. Colorectal cancer screening. Mammogram. Talk with your health care provider about when you should start having regular mammograms. This may depend on whether you have a family history of breast cancer. BRCA-related cancer screening. This may be done if you have a family history of breast, ovarian, tubal, or peritoneal cancers. Bone density scan. This is done to screen for osteoporosis. Talk with your health care provider about your test results, treatment options, and if necessary, the need for more tests. Follow these instructions at home: Eating and drinking  Eat a diet that includes fresh fruits and vegetables, whole grains, lean protein, and low-fat dairy products. Take vitamin and mineral supplements as recommended by your health care provider. Do not drink alcohol if: Your health care provider tells you not to drink. You are pregnant, may be pregnant, or are planning to become pregnant. If you drink alcohol: Limit how much you have to 0-1 drink a day. Know how much alcohol is in your drink. In the [place] one drink equals one 12 oz bottle of beer (355 mL), one 5 oz glass of wine (148 mL), or one 1 oz glass of hard liquor (44 mL). Lifestyle Brush your teeth every morning and night with fluoride toothpaste. Floss one time each day. Exercise for at least 30 minutes 5 or more days each week. Do not use any products that contain nicotine or tobacco. These products include cigarettes, chewing tobacco, and vaping devices, such as e-cigarettes. If you need help quitting, ask your health care provider. Do not use drugs. If you are sexually active, practice safe sex. Use a condom or other form of protection to   prevent STIs. If you do not wish to become pregnant, use a form of birth control. If you plan to become pregnant, see your health care provider for a  prepregnancy visit. Take aspirin only as told by your health care provider. Make sure that you understand how much to take and what form to take. Work with your health care provider to find out whether it is safe and beneficial for you to take aspirin daily. Find healthy ways to manage stress, such as: Meditation, yoga, or listening to music. Journaling. Talking to a trusted person. Spending time with friends and family. Minimize exposure to UV radiation to reduce your risk of skin cancer. Safety Always wear your seat belt while driving or riding in a vehicle. Do not drive: If you have been drinking alcohol. Do not ride with someone who has been drinking. When you are tired or distracted. While texting. If you have been using any mind-altering substances or drugs. Wear a helmet and other protective equipment during sports activities. If you have firearms in your house, make sure you follow all gun safety procedures. Seek help if you have been physically or sexually abused. What's next? Visit your health care provider once a year for an annual wellness visit. Ask your health care provider how often you should have your eyes and teeth checked. Stay up to date on all vaccines. This information is not intended to replace advice given to you by your health care provider. Make sure you discuss any questions you have with your health care provider. Document Revised: 08/12/2020 Document Reviewed: 08/12/2020 Elsevier Patient Education  2023 [employer]  

## 2022-06-14 NOTE — Progress Notes (Unsigned)
Subjective:    Patient ID: [name], female    DOB: 01/07/1971, 52 y.o.   MRN: [id number]  No chief complaint on file.   HPI Patient is in today for follow up on chronic medical concerns and annual preventative exam. No recent febrile illness or hospitalizations. Denies CP/palp/SOB/HA/congestion/fevers/GI or GU c/o. Taking meds as prescribed   Past Medical History:  Diagnosis Date   Acute mastitis of left breast 10/04/2007   Allergy    seasonal allergies   AMA (advanced maternal age) multigravida 35+    Cancer (HCC) 2002   Left leg melanoma   Celiac disease    Chronic back pain    H/O   Complication of anesthesia    Difficult time waking up   Constipation in pregnancy 03/2006   Endometriosis    h/o   Environmental allergies    Family history of breast cancer    Family history of ovarian cancer    Fibroid    During first pregnancy   Fibromyalgia    pt denies dx   First trimester bleeding 2008   GBS carrier    GERD (gastroesophageal reflux disease)    with certain foods-OTC PRN   Granuloma annulare    H/O amenorrhea 10/2006   H/O blood clots    7 yr ago in arm   H/O varicella    As an infant.   Headache(784.0)    Hearing loss 08/15/2016   Heart murmur    heard occassional at MD office   History of melanoma    HTN (hypertension)    on meds   Hyperlipidemia 03/02/2017   diet controlled   IBS (irritable bowel syndrome) 08/15/2016   Irregular uterine bleeding    Joint pain    Lactose intolerance    Leg edema    Leg pain, left 02/09/2016   Monilial vaginitis 07/27/2004   Palpitations    Rh negative status during pregnancy    SAB (spontaneous abortion)    h/o x 2   Seasonal allergies    Sleep apnea 08/15/2016   no CPAP use- uses nightguard   TMJ syndrome    Vegetarianism 08/15/2016   Vitamin D deficiency 08/16/2015    Past Surgical History:  Procedure Laterality Date   ABDOMINAL HYSTERECTOMY  03/01/2011   APPENDECTOMY  02/29/2000   BREAST  REDUCTION SURGERY Bilateral 11/24/2021   Procedure: BREAST REDUCTION WITH LIPOSUCTION;  Surgeon: Peggye Form, DO;  Location: [facility];  Service: Plastics;  Laterality: Bilateral;   DILATION AND EVACUATION     endocervical polyp removal  03/01/1999   ENDOMETRIAL ABLATION  02/29/2008   HERNIA REPAIR  05/16/2012   incisional hernia repair   HYSTEROSCOPY W/ ENDOMETRIAL ABLATION  01/28/2009   INSERTION OF MESH N/A 05/16/2012   Procedure: INSERTION OF MESH;  Surgeon: Clovis Pu. Cornett, MD;  Location: [facility];  Service: General;  Laterality: N/A;   KNEE ARTHROSCOPY Left 12/05/2012   Procedure: LEFT KNEE ARTHROSCOPY;  Surgeon: Nestor Lewandowsky, MD;  Location: [facility];  Service: Orthopedics;  Laterality: Left;   MELANOMA EXCISION  02/29/2000   Left Leg   melanoma removal   02/28/2001   REPLACEMENT TOTAL KNEE BILATERAL Bilateral    SALPINGOOPHORECTOMY  12/08/2011   Procedure: SALPINGO OOPHERECTOMY;  Surgeon: Hal Morales, MD;  Location: [facility];  Service: Gynecology;  Laterality: Bilateral;   TENDON RELEASE  02/28/2002   uterine curettage  01/28/2009   VENTRAL HERNIA REPAIR N/A 05/16/2012  Procedure: LAPAROSCOPIC VENTRAL HERNIA;  Surgeon: Clovis Pu. Cornett, MD;  Location: [facility];  Service: General;  Laterality: N/A;   WISDOM TOOTH EXTRACTION  03/01/1987    Family History  Problem Relation Age of Onset   Hypertension Mother    Heart disease Mother    Anxiety disorder Mother    Breast cancer Mother 8   Heart disease Father        atrial fib   Hypertension Father    Atrial fibrillation Father    Obesity Father    Bladder Cancer Father 57   Lymphoma Father 36   Diverticulitis Brother    Ovarian cancer Maternal Aunt 50   Breast cancer Maternal Grandmother 69   Heart disease Maternal Grandfather        MI   Heart disease Paternal Grandfather        MI   Leukemia Nephew 15       AML and ALL   Colon cancer Neg Hx    Colon polyps Neg Hx     Esophageal cancer Neg Hx    Rectal cancer Neg Hx    Stomach cancer Neg Hx     Social History   Socioeconomic History   Marital status: Married    Spouse name: Mining engineer   Number of children: 3   Years of education: Not on file   Highest education level: Not on file  Occupational History   Occupation: Psychologist, sport and exercise  Tobacco Use   Smoking status: Former    Types: Cigarettes    Quit date: 04/26/2020    Years since quitting: 2.1   Smokeless tobacco: Never   Tobacco comments:    1-3 cigarrettes per week  Vaping Use   Vaping Use: Never used  Substance and Sexual Activity   Alcohol use: No   Drug use: No   Sexual activity: Yes    Birth control/protection: Surgical    Comment: BTL  Other Topics Concern   Not on file  Social History Narrative   ** Merged History Encounter **       Lives with husband and 3 children 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 Wears seat belt   Social Determinants of Health   Financial Resource Strain: Not on file  Food Insecurity: Not on file  Transportation Needs: Not on file  Physical Activity: Not on file  Stress: Not on file  Social Connections: Not on file  Intimate Partner Violence: Not on file    Outpatient Medications Prior to Visit  Medication Sig Dispense Refill   amLODipine (NORVASC) 2.5 MG tablet TAKE 1 TABLET BY MOUTH DAILY 90 tablet 3   Ascorbic Acid (VITAMIN C PO) Take by mouth.     Ascorbic Acid (VITAMIN C) 100 MG tablet Take 100 mg by mouth daily.     Cetirizine HCl (ZYRTEC PO) Take 1 tablet by mouth daily.     cholecalciferol (VITAMIN D3) 25 MCG (1000 UNIT) tablet Take 1,000 Units by mouth daily.     Cyanocobalamin (VITAMIN B 12 PO) Take by mouth.     estradiol (CLIMARA - DOSED IN MG/24 HR) 0.0375 mg/24hr patch Place 0.0375 mg onto the skin once a week.     hydrochlorothiazide (HYDRODIURIL) 25 MG tablet TAKE 1/2 TO 1 TABLET BY MOUTH  DAILY 90 tablet 3   lisinopril (ZESTRIL) 20 MG tablet Take 1 tablet (20 mg total)  by mouth in the morning and at bedtime. 180 tablet 0   MELATONIN PO Take by mouth.  methocarbamol (ROBAXIN) 500 MG tablet Take 1 tablet (500 mg total) by mouth every 8 (eight) hours as needed for muscle spasms. 15 tablet 0   Misc. Devices (DENTAL GUARD) MISC 1 Device by Does not apply route See admin instructions. 1 each 0   TURMERIC CURCUMIN PO Take by mouth.     No facility-administered medications prior to visit.    Allergies  Allergen Reactions   Cymbalta [Duloxetine Hcl] Other (See Comments)    Makes her feel crazy, out of body   Betadine [Povidone Iodine] Other (See Comments)    Dx with allergy testing   Iodine    Shellfish Allergy Other (See Comments)    Dx with allergy testing   Prednisone Rash    Flushing    Review of Systems  Constitutional:  Negative for chills, fever and malaise/fatigue.  HENT:  Negative for congestion and hearing loss.   Eyes:  Negative for discharge.  Respiratory:  Negative for cough, sputum production and shortness of breath.   Cardiovascular:  Negative for chest pain, palpitations and leg swelling.  Gastrointestinal:  Negative for abdominal pain, blood in stool, constipation, diarrhea, heartburn, nausea and vomiting.  Genitourinary:  Negative for dysuria, frequency, hematuria and urgency.  Musculoskeletal:  Negative for back pain, falls and myalgias.  Skin:  Negative for rash.  Neurological:  Negative for dizziness, sensory change, loss of consciousness, weakness and headaches.  Endo/Heme/Allergies:  Negative for environmental allergies. Does not bruise/bleed easily.  Psychiatric/Behavioral:  Negative for depression and suicidal ideas. The patient is not nervous/anxious and does not have insomnia.        Objective:    Physical Exam Constitutional:      General: She is not in acute distress.    Appearance: Normal appearance. She is not diaphoretic.  HENT:     Head: Normocephalic and atraumatic.     Right Ear: Tympanic membrane, ear  canal and external ear normal.     Left Ear: Tympanic membrane, ear canal and external ear normal.     Nose: Nose normal.     Mouth/Throat:     Mouth: Mucous membranes are moist.     Pharynx: Oropharynx is clear. No oropharyngeal exudate.  Eyes:     General: No scleral icterus.       Right eye: No discharge.        Left eye: No discharge.     Conjunctiva/sclera: Conjunctivae normal.     Pupils: Pupils are equal, round, and reactive to light.  Neck:     Thyroid: No thyromegaly.  Cardiovascular:     Rate and Rhythm: Normal rate and regular rhythm.     Heart sounds: Normal heart sounds. No murmur heard. Pulmonary:     Effort: Pulmonary effort is normal. No respiratory distress.     Breath sounds: Normal breath sounds. No wheezing or rales.  Abdominal:     General: Bowel sounds are normal. There is no distension.     Palpations: Abdomen is soft. There is no mass.     Tenderness: There is no abdominal tenderness.  Musculoskeletal:        General: No tenderness. Normal range of motion.     Cervical back: Normal range of motion and neck supple.  Lymphadenopathy:     Cervical: No cervical adenopathy.  Skin:    General: Skin is warm and dry.     Findings: No rash.  Neurological:     General: No focal deficit present.     Mental Status:  She is alert and oriented to person, place, and time.     Cranial Nerves: No cranial nerve deficit.     Coordination: Coordination normal.     Deep Tendon Reflexes: Reflexes are normal and symmetric. Reflexes normal.  Psychiatric:        Mood and Affect: Mood normal.        Behavior: Behavior normal.        Thought Content: Thought content normal.        Judgment: Judgment normal.     LMP 11/19/2010  Wt Readings from Last 3 Encounters:  12/14/21 222 lb 6 oz (100.9 kg)  11/24/21 225 lb 5 oz (102.2 kg)  11/16/21 219 lb (99.3 kg)    Diabetic Foot Exam - Simple   No data filed    Lab Results  Component Value Date   WBC 6.7 03/04/2021    HGB 13.3 03/04/2021   HCT 39.6 03/04/2021   PLT 355.0 03/04/2021   GLUCOSE 99 11/16/2021   CHOL 170 03/04/2021   TRIG 129.0 03/04/2021   HDL 42.90 03/04/2021   LDLCALC 101 (H) 03/04/2021   ALT 27 03/04/2021   AST 15 03/04/2021   NA 138 11/16/2021   K 3.9 11/16/2021   CL 104 11/16/2021   CREATININE 0.57 11/16/2021   BUN 9 11/16/2021   CO2 25 11/16/2021   TSH 1.23 03/04/2021   INR 1.0 11/03/2020   HGBA1C 5.7 03/04/2021    Lab Results  Component Value Date   TSH 1.23 03/04/2021   Lab Results  Component Value Date   WBC 6.7 03/04/2021   HGB 13.3 03/04/2021   HCT 39.6 03/04/2021   MCV 85.5 03/04/2021   PLT 355.0 03/04/2021   Lab Results  Component Value Date   NA 138 11/16/2021   K 3.9 11/16/2021   CO2 25 11/16/2021   GLUCOSE 99 11/16/2021   BUN 9 11/16/2021   CREATININE 0.57 11/16/2021   BILITOT 0.5 03/04/2021   ALKPHOS 68 03/04/2021   AST 15 03/04/2021   ALT 27 03/04/2021   PROT 6.3 03/04/2021   ALBUMIN 4.0 03/04/2021   CALCIUM 9.8 11/16/2021   ANIONGAP 9 11/16/2021   GFR 105.10 03/04/2021   Lab Results  Component Value Date   CHOL 170 03/04/2021   Lab Results  Component Value Date   HDL 42.90 03/04/2021   Lab Results  Component Value Date   LDLCALC 101 (H) 03/04/2021   Lab Results  Component Value Date   TRIG 129.0 03/04/2021   Lab Results  Component Value Date   CHOLHDL 4 03/04/2021   Lab Results  Component Value Date   HGBA1C 5.7 03/04/2021       Assessment & Plan:  Essential hypertension Assessment & Plan: Well controlled, no changes to meds. Encouraged heart healthy diet such as the DASH diet and exercise as tolerated.     Hyperglycemia Assessment & Plan: hgba1c acceptable, minimize simple carbs. Increase exercise as tolerated.   Orders: -     Hemoglobin A1c -     Lipid panel  Hyperlipidemia, unspecified hyperlipidemia type Assessment & Plan: Encourage heart healthy diet such as MIND or DASH diet, increase exercise, avoid  trans fats, simple carbohydrates and processed foods, consider a krill or fish or flaxseed oil cap daily.    Muscle cramps Assessment & Plan: Hydrate and monitor    Preventative health care Assessment & Plan: Patient encouraged to maintain heart healthy diet, regular exercise, adequate sleep. Consider daily probiotics. Take medications as prescribed.  Labs ordered and reviewed. Colonoscopy February 2022 repeat in 2032. Given and reviewed copy of ACP documents from [facility] Secretary of State and encouraged to complete and return  MGM Pap   Vitamin D deficiency -     VITAMIN D 25 Hydroxy (Vit-D Deficiency, Fractures)  Hypertension, unspecified type -     TSH -     CBC with Differential/Platelet    Danise Edge, MD

## 2022-06-15 NOTE — Assessment & Plan Note (Signed)
Improved s/p breast reduction. Largely happy with results but would have been happier to have more removed

## 2022-07-13 ENCOUNTER — Other Ambulatory Visit: Payer: Self-pay | Admitting: Family Medicine

## 2022-07-14 ENCOUNTER — Encounter: Payer: Self-pay | Admitting: Family Medicine

## 2022-07-14 MED ORDER — ESTRADIOL 0.0375 MG/24HR TD PTWK: 0.0375 mg | MEDICATED_PATCH | TRANSDERMAL | 0 refills | Status: DC

## 2022-07-14 MED ORDER — LISINOPRIL 20 MG PO TABS: 20.0000 mg | ORAL_TABLET | Freq: Two times a day (BID) | ORAL | 0 refills | Status: DC

## 2022-07-19 ENCOUNTER — Other Ambulatory Visit: Payer: Self-pay

## 2022-07-19 MED ORDER — LISINOPRIL 20 MG PO TABS: ORAL_TABLET | ORAL | 3 refills | Status: DC

## 2022-08-16 ENCOUNTER — Ambulatory Visit: Payer: 59

## 2022-09-10 ENCOUNTER — Other Ambulatory Visit: Payer: Self-pay | Admitting: Family Medicine

## 2022-09-20 ENCOUNTER — Encounter: Payer: Self-pay | Admitting: Plastic Surgery

## 2022-09-20 ENCOUNTER — Ambulatory Visit (INDEPENDENT_AMBULATORY_CARE_PROVIDER_SITE_OTHER): Payer: 59 | Admitting: Plastic Surgery

## 2022-09-20 VITALS — BP 124/81 | HR 86 | Ht 64.0 in | Wt 229.2 lb

## 2022-09-20 DIAGNOSIS — N644 Mastodynia: Secondary | ICD-10-CM | POA: Diagnosis not present

## 2022-09-20 DIAGNOSIS — N62 Hypertrophy of breast: Secondary | ICD-10-CM

## 2022-09-20 DIAGNOSIS — M546 Pain in thoracic spine: Secondary | ICD-10-CM

## 2022-09-20 DIAGNOSIS — G8929 Other chronic pain: Secondary | ICD-10-CM

## 2022-09-20 NOTE — Progress Notes (Signed)
   Subjective:    Patient ID: [name], female    DOB: 27-Oct-1970, 52 y.o.   MRN: [id number]  The patient is a 52 year old female here for evaluation of her breasts.  She underwent bilateral breast reduction in September 2023.  She had over 1400 g removed from each breast.  She is doing well but complains of some pain in the lateral aspect of the right breast.  She has better symmetry now than she had preoperatively.  But there is some concern over what is causing the lateral pain this far out from surgery.  I do not feel any lumps or bumps.  It is time for a mammogram.      Review of Systems  Constitutional: Negative.   Eyes: Negative.   Respiratory: Negative.    Cardiovascular: Negative.   Gastrointestinal: Negative.   Endocrine: Negative.   Genitourinary: Negative.   Musculoskeletal: Negative.        Objective:   Physical Exam Vitals and nursing note reviewed.  Constitutional:      Appearance: Normal appearance.  HENT:     Head: Atraumatic.  Cardiovascular:     Rate and Rhythm: Normal rate.     Pulses: Normal pulses.  Musculoskeletal:        General: Tenderness present. No swelling or deformity.  Skin:    General: Skin is warm.     Capillary Refill: Capillary refill takes less than 2 seconds.     Coloration: Skin is not jaundiced.     Findings: No bruising or lesion.  Neurological:     Mental Status: She is alert and oriented to person, place, and time.  Psychiatric:        Mood and Affect: Mood normal.        Behavior: Behavior normal.        Thought Content: Thought content normal.        Judgment: Judgment normal.        Assessment & Plan:     ICD-10-CM   1. Symptomatic mammary hypertrophy  N62     2. Chronic midline thoracic back pain  M54.6    G89.29        Will plan to do bilateral mammogram and if it is not conclusive we can do an ultrasound.  The patient is in agreement.  Will plan on doing a televisit to discuss the results  after.  Pictures were obtained of the patient and placed in the chart with the patient's or guardian's permission.

## 2022-10-06 ENCOUNTER — Encounter: Payer: Self-pay | Admitting: Plastic Surgery

## 2022-10-07 NOTE — Telephone Encounter (Signed)
Can you please assist .

## 2022-10-07 NOTE — Telephone Encounter (Signed)
It looks like Dr. Ulice Bold had placed it at her last visit.

## 2022-10-07 NOTE — Telephone Encounter (Signed)
Yes, understood. If I order imaging I will be sure to let you know.

## 2022-10-21 ENCOUNTER — Telehealth: Payer: 59 | Admitting: Plastic Surgery

## 2022-11-02 ENCOUNTER — Other Ambulatory Visit: Payer: Self-pay | Admitting: Plastic Surgery

## 2022-11-02 DIAGNOSIS — N644 Mastodynia: Secondary | ICD-10-CM

## 2022-11-07 ENCOUNTER — Telehealth: Payer: Self-pay | Admitting: *Deleted

## 2022-11-07 NOTE — Telephone Encounter (Signed)
Received on (11/03/2022) via of fax an urgent fax order for Mammogram and Ultrasound from [facility] requesting signature,date, and return.  Given to provider to sign.  Order signed and faxed back to [facility].  Confirmation received and copy scanned into the chart.//AB/CMA

## 2022-11-14 ENCOUNTER — Other Ambulatory Visit: Payer: Self-pay | Admitting: Family Medicine

## 2022-11-30 ENCOUNTER — Other Ambulatory Visit: Payer: 59

## 2022-12-14 ENCOUNTER — Encounter: Payer: Self-pay | Admitting: Plastic Surgery

## 2022-12-14 ENCOUNTER — Ambulatory Visit: Payer: 59

## 2022-12-14 ENCOUNTER — Ambulatory Visit
Admission: RE | Admit: 2022-12-14 | Discharge: 2022-12-14 | Disposition: A | Discharge status: Home / Self Care | Payer: 59 | Source: Ambulatory Visit | Attending: Plastic Surgery | Admitting: Plastic Surgery

## 2022-12-14 DIAGNOSIS — N644 Mastodynia: Secondary | ICD-10-CM

## 2022-12-15 ENCOUNTER — Other Ambulatory Visit: Payer: Self-pay | Admitting: Plastic Surgery

## 2022-12-15 DIAGNOSIS — N62 Hypertrophy of breast: Secondary | ICD-10-CM

## 2022-12-15 DIAGNOSIS — N644 Mastodynia: Secondary | ICD-10-CM

## 2022-12-15 NOTE — Progress Notes (Signed)
MM negative.  Patient would like to proceed with [place].  It was ordered.

## 2022-12-18 NOTE — Assessment & Plan Note (Signed)
hgba1c acceptable, minimize simple carbs. Increase exercise as tolerated.  

## 2022-12-18 NOTE — Assessment & Plan Note (Signed)
Supplement and monitor 

## 2022-12-18 NOTE — Assessment & Plan Note (Signed)
Hydrate and monitor 

## 2022-12-18 NOTE — Assessment & Plan Note (Signed)
Encourage heart healthy diet such as MIND or DASH diet, increase exercise, avoid trans fats, simple carbohydrates and processed foods, consider a krill or fish or flaxseed oil cap daily.  °

## 2022-12-19 ENCOUNTER — Ambulatory Visit: Payer: 59 | Admitting: Family Medicine

## 2022-12-19 ENCOUNTER — Encounter: Payer: Self-pay | Admitting: Family Medicine

## 2022-12-19 VITALS — BP 112/68 | HR 80 | Temp 98.4°F | Resp 18 | Ht 64.0 in | Wt 232.2 lb

## 2022-12-19 DIAGNOSIS — E785 Hyperlipidemia, unspecified: Secondary | ICD-10-CM

## 2022-12-19 DIAGNOSIS — Z23 Encounter for immunization: Secondary | ICD-10-CM | POA: Diagnosis not present

## 2022-12-19 DIAGNOSIS — R739 Hyperglycemia, unspecified: Secondary | ICD-10-CM | POA: Diagnosis not present

## 2022-12-19 DIAGNOSIS — R252 Cramp and spasm: Secondary | ICD-10-CM

## 2022-12-19 DIAGNOSIS — E559 Vitamin D deficiency, unspecified: Secondary | ICD-10-CM

## 2022-12-19 LAB — CBC WITH DIFFERENTIAL/PLATELET
Basophils Absolute: 0.1 10*3/uL (ref 0.0–0.1)
Basophils Relative: 0.8 % (ref 0.0–3.0)
Eosinophils Absolute: 0.5 10*3/uL (ref 0.0–0.7)
Eosinophils Relative: 7.1 % — ABNORMAL HIGH (ref 0.0–5.0)
HCT: 43.6 % (ref 36.0–46.0)
Hemoglobin: 14.3 g/dL (ref 12.0–15.0)
Lymphocytes Relative: 29.8 % (ref 12.0–46.0)
Lymphs Abs: 2.3 10*3/uL (ref 0.7–4.0)
MCHC: 32.9 g/dL (ref 30.0–36.0)
MCV: 89.3 fL (ref 78.0–100.0)
Monocytes Absolute: 0.5 10*3/uL (ref 0.1–1.0)
Monocytes Relative: 6.4 % (ref 3.0–12.0)
Neutro Abs: 4.3 10*3/uL (ref 1.4–7.7)
Neutrophils Relative %: 55.9 % (ref 43.0–77.0)
Platelets: 373 10*3/uL (ref 150.0–400.0)
RBC: 4.88 Mil/uL (ref 3.87–5.11)
RDW: 13.8 % (ref 11.5–15.5)
WBC: 7.6 10*3/uL (ref 4.0–10.5)

## 2022-12-19 LAB — COMPREHENSIVE METABOLIC PANEL
ALT: 20 U/L (ref 0–35)
AST: 15 U/L (ref 0–37)
Albumin: 4.3 g/dL (ref 3.5–5.2)
Alkaline Phosphatase: 64 U/L (ref 39–117)
BUN: 14 mg/dL (ref 6–23)
CO2: 31 meq/L (ref 19–32)
Calcium: 10.1 mg/dL (ref 8.4–10.5)
Chloride: 100 meq/L (ref 96–112)
Creatinine, Ser: 0.57 mg/dL (ref 0.40–1.20)
GFR: 103.34 mL/min (ref >60.00)
Glucose, Bld: 119 mg/dL — ABNORMAL HIGH (ref 70–99)
Potassium: 4.1 meq/L (ref 3.5–5.1)
Sodium: 141 meq/L (ref 135–145)
Total Bilirubin: 0.5 mg/dL (ref 0.2–1.2)
Total Protein: 6.7 g/dL (ref 6.0–8.3)

## 2022-12-19 LAB — LIPID PANEL
Cholesterol: 169 mg/dL (ref 0–200)
HDL: 51.1 mg/dL (ref >39.00)
LDL Cholesterol: 77 mg/dL (ref 0–99)
NonHDL: 118.19
Total CHOL/HDL Ratio: 3
Triglycerides: 204 mg/dL — ABNORMAL HIGH (ref 0.0–149.0)
VLDL: 40.8 mg/dL — ABNORMAL HIGH (ref 0.0–40.0)

## 2022-12-19 LAB — HEMOGLOBIN A1C: Hgb A1c MFr Bld: 5.8 % (ref 4.6–6.5)

## 2022-12-19 LAB — TSH: TSH: 1.06 u[IU]/mL (ref 0.35–5.50)

## 2022-12-19 NOTE — Patient Instructions (Signed)
Hypertension, Adult High blood pressure (hypertension) is when the force of blood pumping through the arteries is too strong. The arteries are the blood vessels that carry blood from the heart throughout the body. Hypertension forces the heart to work harder to pump blood and may cause arteries to become narrow or stiff. Untreated or uncontrolled hypertension can lead to a heart attack, heart failure, a stroke, kidney disease, and other problems. A blood pressure reading consists of a higher number over a lower number. Ideally, your blood pressure should be below 120/80. The first ("top") number is called the systolic pressure. It is a measure of the pressure in your arteries as your heart beats. The second ("bottom") number is called the diastolic pressure. It is a measure of the pressure in your arteries as the heart relaxes. What are the causes? The exact cause of this condition is not known. There are some conditions that result in high blood pressure. What increases the risk? Certain factors may make you more likely to develop high blood pressure. Some of these risk factors are under your control, including: Smoking. Not getting enough exercise or physical activity. Being overweight. Having too much fat, sugar, calories, or salt (sodium) in your diet. Drinking too much alcohol. Other risk factors include: Having a personal history of heart disease, diabetes, high cholesterol, or kidney disease. Stress. Having a family history of high blood pressure and high cholesterol. Having obstructive sleep apnea. Age. The risk increases with age. What are the signs or symptoms? High blood pressure may not cause symptoms. Very high blood pressure (hypertensive crisis) may cause: Headache. Fast or irregular heartbeats (palpitations). Shortness of breath. Nosebleed. Nausea and vomiting. Vision changes. Severe chest pain, dizziness, and seizures. How is this diagnosed? This condition is diagnosed by  measuring your blood pressure while you are seated, with your arm resting on a flat surface, your legs uncrossed, and your feet flat on the floor. The cuff of the blood pressure monitor will be placed directly against the skin of your upper arm at the level of your heart. Blood pressure should be measured at least twice using the same arm. Certain conditions can cause a difference in blood pressure between your right and left arms. If you have a high blood pressure reading during one visit or you have normal blood pressure with other risk factors, you may be asked to: Return on a different day to have your blood pressure checked again. Monitor your blood pressure at home for 1 week or longer. If you are diagnosed with hypertension, you may have other blood or imaging tests to help your health care provider understand your overall risk for other conditions. How is this treated? This condition is treated by making healthy lifestyle changes, such as eating healthy foods, exercising more, and reducing your alcohol intake. You may be referred for counseling on a healthy diet and physical activity. Your health care provider may prescribe medicine if lifestyle changes are not enough to get your blood pressure under control and if: Your systolic blood pressure is above 130. Your diastolic blood pressure is above 80. Your personal target blood pressure may vary depending on your medical conditions, your age, and other factors. Follow these instructions at home: Eating and drinking  Eat a diet that is high in fiber and potassium, and low in sodium, added sugar, and fat. An example of this eating plan is called the DASH diet. DASH stands for Dietary Approaches to Stop Hypertension. To eat this way: Eat   plenty of fresh fruits and vegetables. Try to fill one half of your plate at each meal with fruits and vegetables. Eat whole grains, such as whole-wheat pasta, brown rice, or whole-grain bread. Fill about one  fourth of your plate with whole grains. Eat or drink low-fat dairy products, such as skim milk or low-fat yogurt. Avoid fatty cuts of meat, processed or cured meats, and poultry with skin. Fill about one fourth of your plate with lean proteins, such as fish, chicken without skin, beans, eggs, or tofu. Avoid pre-made and processed foods. These tend to be higher in sodium, added sugar, and fat. Reduce your daily sodium intake. Many people with hypertension should eat less than 1,500 mg of sodium a day. Do not drink alcohol if: Your health care provider tells you not to drink. You are pregnant, may be pregnant, or are planning to become pregnant. If you drink alcohol: Limit how much you have to: 0-1 drink a day for women. 0-2 drinks a day for men. Know how much alcohol is in your drink. In the [place] one drink equals one 12 oz bottle of beer (355 mL), one 5 oz glass of wine (148 mL), or one 1 oz glass of hard liquor (44 mL). Lifestyle  Work with your health care provider to maintain a healthy body weight or to lose weight. Ask what an ideal weight is for you. Get at least 30 minutes of exercise that causes your heart to beat faster (aerobic exercise) most days of the week. Activities may include walking, swimming, or biking. Include exercise to strengthen your muscles (resistance exercise), such as Pilates or lifting weights, as part of your weekly exercise routine. Try to do these types of exercises for 30 minutes at least 3 days a week. Do not use any products that contain nicotine or tobacco. These products include cigarettes, chewing tobacco, and vaping devices, such as e-cigarettes. If you need help quitting, ask your health care provider. Monitor your blood pressure at home as told by your health care provider. Keep all follow-up visits. This is important. Medicines Take over-the-counter and prescription medicines only as told by your health care provider. Follow directions carefully. Blood  pressure medicines must be taken as prescribed. Do not skip doses of blood pressure medicine. Doing this puts you at risk for problems and can make the medicine less effective. Ask your health care provider about side effects or reactions to medicines that you should watch for. Contact a health care provider if you: Think you are having a reaction to a medicine you are taking. Have headaches that keep coming back (recurring). Feel dizzy. Have swelling in your ankles. Have trouble with your vision. Get help right away if you: Develop a severe headache or confusion. Have unusual weakness or numbness. Feel faint. Have severe pain in your chest or abdomen. Vomit repeatedly. Have trouble breathing. These symptoms may be an emergency. Get help right away. Call 911. Do not wait to see if the symptoms will go away. Do not drive yourself to the hospital. Summary Hypertension is when the force of blood pumping through your arteries is too strong. If this condition is not controlled, it may put you at risk for serious complications. Your personal target blood pressure may vary depending on your medical conditions, your age, and other factors. For most people, a normal blood pressure is less than 120/80. Hypertension is treated with lifestyle changes, medicines, or a combination of both. Lifestyle changes include losing weight, eating a healthy,   low-sodium diet, exercising more, and limiting alcohol. This information is not intended to replace advice given to you by your health care provider. Make sure you discuss any questions you have with your health care provider. Document Revised: 12/22/2020 Document Reviewed: 12/22/2020 Elsevier Patient Education  2024 [employer]  

## 2022-12-19 NOTE — Progress Notes (Signed)
Subjective:    Patient ID: [name], female    DOB: December 13, 1970, 52 y.o.   MRN: [id number]  Chief Complaint  Patient presents with  . Follow-up    6 month    HPI Discussed the use of AI scribe software for clinical note transcription with the patient, who gave verbal consent to proceed.  History of Present Illness   The patient, with a history of hypertension and bowel issues, presents for a routine follow-up. She reports ongoing pain in right breast following a reduction surgery last year. The patient also mentions a family history of breast and ovarian cancer, but genetic testing has not revealed any known risk genes. The patient is currently on estrogen therapy and is considering discontinuing it due to lack of symptoms.  The patient also reports occasional lower abdominal cramps and frequent bowel movements in the morning. These symptoms are mostly managed with daily Metamucil intake. The patient also mentions weight gain despite feeling like she is not eating much. She expresses frustration with this and is considering weight loss injections, but has not yet decided.  The patient is due for a shingles vaccine and is considering getting a flu shot. She also discusses her hypertension management and the possibility of reducing her hydrochlorothiazide dosage.        Past Medical History:  Diagnosis Date  . Acute mastitis of left breast 10/04/2007  . Allergy    seasonal allergies  . AMA (advanced maternal age) multigravida 35+   . Cancer (HCC) 2002   Left leg melanoma  . Celiac disease   . Chronic back pain    H/O  . Complication of anesthesia    Difficult time waking up  . Constipation in pregnancy 03/2006  . Endometriosis    h/o  . Environmental allergies   . Family history of breast cancer   . Family history of ovarian cancer   . Fibroid    During first pregnancy  . Fibromyalgia    pt denies dx  . First trimester bleeding 2008  . GBS carrier   . GERD  (gastroesophageal reflux disease)    with certain foods-OTC PRN  . Granuloma annulare   . H/O amenorrhea 10/2006  . H/O blood clots    7 yr ago in arm  . H/O varicella    As an infant.  . Headache(784.0)   . Hearing loss 08/15/2016  . Heart murmur    heard occassional at MD office  . History of melanoma   . HTN (hypertension)    on meds  . Hyperlipidemia 03/02/2017   diet controlled  . IBS (irritable bowel syndrome) 08/15/2016  . Irregular uterine bleeding   . Joint pain   . Lactose intolerance   . Leg edema   . Leg pain, left 02/09/2016  . Monilial vaginitis 07/27/2004  . Palpitations   . Rh negative status during pregnancy   . SAB (spontaneous abortion)    h/o x 2  . Seasonal allergies   . Sleep apnea 08/15/2016   no CPAP use- uses nightguard  . TMJ syndrome   . Vegetarianism 08/15/2016  . Vitamin D deficiency 08/16/2015    Past Surgical History:  Procedure Laterality Date  . ABDOMINAL HYSTERECTOMY  03/01/2011  . APPENDECTOMY  02/29/2000  . BREAST REDUCTION SURGERY Bilateral 11/24/2021   Procedure: BREAST REDUCTION WITH LIPOSUCTION;  Surgeon: Peggye Form, DO;  Location: [facility];  Service: Plastics;  Laterality: Bilateral;  . DILATION AND EVACUATION    .  endocervical polyp removal  03/01/1999  . ENDOMETRIAL ABLATION  02/29/2008  . HERNIA REPAIR  05/16/2012   incisional hernia repair  . HYSTEROSCOPY W/ ENDOMETRIAL ABLATION  01/28/2009  . INSERTION OF MESH N/A 05/16/2012   Procedure: INSERTION OF MESH;  Surgeon: Clovis Pu. Cornett, MD;  Location: [facility];  Service: General;  Laterality: N/A;  . KNEE ARTHROSCOPY Left 12/05/2012   Procedure: LEFT KNEE ARTHROSCOPY;  Surgeon: Nestor Lewandowsky, MD;  Location: [facility];  Service: Orthopedics;  Laterality: Left;  [name] MELANOMA EXCISION  02/29/2000   Left Leg  . melanoma removal   02/28/2001  . REDUCTION MAMMAPLASTY Bilateral    10/2021  . REPLACEMENT TOTAL KNEE BILATERAL Bilateral    . SALPINGOOPHORECTOMY  12/08/2011   Procedure: SALPINGO OOPHERECTOMY;  Surgeon: Hal Morales, MD;  Location: [facility];  Service: Gynecology;  Laterality: Bilateral;  . TENDON RELEASE  02/28/2002  . uterine curettage  01/28/2009  . VENTRAL HERNIA REPAIR N/A 05/16/2012   Procedure: LAPAROSCOPIC VENTRAL HERNIA;  Surgeon: Clovis Pu. Cornett, MD;  Location: [facility];  Service: General;  Laterality: N/A;  . WISDOM TOOTH EXTRACTION  03/01/1987    Family History  Problem Relation Age of Onset  . Hypertension Mother   . Heart disease Mother   . Anxiety disorder Mother   . Breast cancer Mother 61  . Heart disease Father        atrial fib  . Hypertension Father   . Atrial fibrillation Father   . Obesity Father   . Bladder Cancer Father 30  . Lymphoma Father 89  . Diverticulitis Brother   . Ovarian cancer Maternal Aunt 50  . Breast cancer Maternal Grandmother 26  . Heart disease Maternal Grandfather        MI  . Heart disease Paternal Grandfather        MI  . Leukemia Nephew 15       AML and ALL  . Colon cancer Neg Hx   . Colon polyps Neg Hx   . Esophageal cancer Neg Hx   . Rectal cancer Neg Hx   . Stomach cancer Neg Hx     Social History   Socioeconomic History  . Marital status: Married    Spouse name: Mining engineer  . Number of children: 3  . Years of education: Not on file  . Highest education level: Not on file  Occupational History  . Occupation: Psychologist, sport and exercise  Tobacco Use  . Smoking status: Former    Current packs/day: 0.00    Types: Cigarettes    Quit date: 04/26/2020    Years since quitting: 2.6  . Smokeless tobacco: Never  . Tobacco comments:    1-3 cigarrettes per week  Vaping Use  . Vaping status: Never Used  Substance and Sexual Activity  . Alcohol use: No  . Drug use: No  . Sexual activity: Yes    Birth control/protection: Surgical    Comment: BTL  Other Topics Concern  . Not on file  Social History Narrative   ** Merged History Encounter **        Lives with husband and 3 children 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 Wears seat belt   Social Determinants of Health   Financial Resource Strain: Not on file  Food Insecurity: Not on file  Transportation Needs: Not on file  Physical Activity: Not on file  Stress: Not on file  Social Connections: Not on file  Intimate Partner Violence: Not on file  Outpatient Medications Prior to Visit  Medication Sig Dispense Refill  . amLODipine (NORVASC) 2.5 MG tablet TAKE 1 TABLET BY MOUTH DAILY 90 tablet 3  . Cetirizine HCl (ZYRTEC PO) Take 1 tablet by mouth daily.    . cholecalciferol (VITAMIN D3) 25 MCG (1000 UNIT) tablet Take 1,000 Units by mouth daily.    [name] estradiol (CLIMARA - DOSED IN MG/24 HR) 0.0375 mg/24hr patch APPLY 1 PATCH TOPICALLY ONCE A  WEEK 4 patch 3  . hydrochlorothiazide (HYDRODIURIL) 25 MG tablet TAKE 1/2 TO 1 TABLET BY MOUTH  DAILY 90 tablet 3  . lisinopril (ZESTRIL) 20 MG tablet Take 1 tablet (20 mg total) by mouth in the morning and at bedtime. 180 tablet 0  . lisinopril (ZESTRIL) 20 MG tablet TAKE 1 TABLET BY MOUTH IN THE  MORNING AND AT BEDTIME 180 tablet 3  . MELATONIN PO Take by mouth.    . Misc. Devices (DENTAL GUARD) MISC 1 Device by Does not apply route See admin instructions. 1 each 0  . TURMERIC CURCUMIN PO Take by mouth.     No facility-administered medications prior to visit.    Allergies  Allergen Reactions  . Cymbalta [Duloxetine Hcl] Other (See Comments)    Makes her feel crazy, out of body  . Betadine [Povidone Iodine] Other (See Comments)    Dx with allergy testing  . Iodine   . Shellfish Allergy Other (See Comments)    Dx with allergy testing  . Prednisone Rash    Flushing    Review of Systems  Constitutional:  Negative for fever and malaise/fatigue.  HENT:  Negative for congestion.   Eyes:  Negative for blurred vision.  Respiratory:  Negative for shortness of breath.   Cardiovascular:  Negative for chest pain, palpitations and  leg swelling.  Gastrointestinal:  Negative for abdominal pain, blood in stool and nausea.  Genitourinary:  Negative for dysuria and frequency.  Musculoskeletal:  Negative for falls.  Skin:  Negative for rash.  Neurological:  Negative for dizziness, loss of consciousness and headaches.  Endo/Heme/Allergies:  Negative for environmental allergies.  Psychiatric/Behavioral:  Negative for depression. The patient is not nervous/anxious.       Objective:    Physical Exam  BP 112/68 (BP Location: Right Arm, Patient Position: Sitting, Cuff Size: Large)   Pulse 80   Temp 98.4 F (36.9 C) (Oral)   Resp 18   Ht 5\' 4"  (1.626 m)   Wt 232 lb 3.2 oz (105.3 kg)   LMP 11/19/2010   SpO2 96%   BMI 39.86 kg/m  Wt Readings from Last 3 Encounters:  12/19/22 232 lb 3.2 oz (105.3 kg)  09/20/22 229 lb 3.2 oz (104 kg)  06/14/22 223 lb 12.8 oz (101.5 kg)    Diabetic Foot Exam - Simple   No data filed    Lab Results  Component Value Date   WBC 7.6 12/19/2022   HGB 14.3 12/19/2022   HCT 43.6 12/19/2022   PLT 373.0 12/19/2022   GLUCOSE 119 (H) 12/19/2022   CHOL 169 12/19/2022   TRIG 204.0 (H) 12/19/2022   HDL 51.10 12/19/2022   LDLCALC 77 12/19/2022   ALT 20 12/19/2022   AST 15 12/19/2022   NA 141 12/19/2022   K 4.1 12/19/2022   CL 100 12/19/2022   CREATININE 0.57 12/19/2022   BUN 14 12/19/2022   CO2 31 12/19/2022   TSH 1.06 12/19/2022   INR 1.0 11/03/2020   HGBA1C 5.8 12/19/2022    Lab Results  Component Value Date   TSH 1.06 12/19/2022   Lab Results  Component Value Date   WBC 7.6 12/19/2022   HGB 14.3 12/19/2022   HCT 43.6 12/19/2022   MCV 89.3 12/19/2022   PLT 373.0 12/19/2022   Lab Results  Component Value Date   NA 141 12/19/2022   K 4.1 12/19/2022   CO2 31 12/19/2022   GLUCOSE 119 (H) 12/19/2022   BUN 14 12/19/2022   CREATININE 0.57 12/19/2022   BILITOT 0.5 12/19/2022   ALKPHOS 64 12/19/2022   AST 15 12/19/2022   ALT 20 12/19/2022   PROT 6.7 12/19/2022    ALBUMIN 4.3 12/19/2022   CALCIUM 10.1 12/19/2022   ANIONGAP 9 11/16/2021   GFR 103.34 12/19/2022   Lab Results  Component Value Date   CHOL 169 12/19/2022   Lab Results  Component Value Date   HDL 51.10 12/19/2022   Lab Results  Component Value Date   LDLCALC 77 12/19/2022   Lab Results  Component Value Date   TRIG 204.0 (H) 12/19/2022   Lab Results  Component Value Date   CHOLHDL 3 12/19/2022   Lab Results  Component Value Date   HGBA1C 5.8 12/19/2022       Assessment & Plan:  Hyperglycemia Assessment & Plan: hgba1c acceptable, minimize simple carbs. Increase exercise as tolerated.   Orders: -     Comprehensive metabolic panel -     Hemoglobin A1c -     TSH  Hyperlipidemia, unspecified hyperlipidemia type Assessment & Plan: Encourage heart healthy diet such as MIND or DASH diet, increase exercise, avoid trans fats, simple carbohydrates and processed foods, consider a krill or fish or flaxseed oil cap daily.   Orders: -     Lipid panel -     TSH  Muscle cramps Assessment & Plan: Hydrate and monitor   Orders: -     CBC with Differential/Platelet  Vitamin D deficiency Assessment & Plan: Supplement and monitor  Orders: -     Vitamin D 1,25 dihydroxy  Need for shingles vaccine -     Varicella-zoster vaccine IM    Assessment and Plan    Shingles Vaccination Second dose due today. Discussed the efficacy of the vaccine and potential side effects including body aches. -Administer second dose of Shingles vaccine today.  Hyperglycemia A1C of 5.7, at the upper limit of normal. Discussed the importance of regular monitoring and the potential impact of HCTZ on glucose metabolism. -Order fasting labs to monitor glucose levels. -Consider reducing HCTZ dose if glucose levels increase.  Hypertension Currently managed with Lisinopril, Amlodipine, and HCTZ. Discussed potential reduction of HCTZ due to potential impact on glucose metabolism and patient's  history of managing hypertension with only Lisinopril prior to knee surgery. -Continue current medications. -Consider reducing HCTZ dose if glucose levels increase. -Monitor blood pressure at home and report any changes.  Breast Health Recent mammogram with normal results. Discussed potential for breast MRI due to family history of breast cancer. -Consult with OB/GYN regarding potential for breast MRI. -Continue regular mammograms.  Abdominal Cramping Occasional severe lower abdominal cramps managed with daily Metamucil. -Continue Metamucil as tolerated.  Vitamin D Supplementation Patient taking 5000 IU daily. Discussed potential impact of microplastics on Vitamin D metabolism. -Continue Vitamin D supplementation as tolerated.  General Health Maintenance / Followup Plans -Administer influenza vaccine in 2 weeks. -Return in 6-7 months for routine follow-up. -Consider pelvic exam every 3 years if continuing estrogen therapy. -Consider weight loss strategies and potential for weight  loss medication if weight gain continues.         Danise Edge, MD

## 2022-12-23 LAB — VITAMIN D 1,25 DIHYDROXY
Vitamin D 1, 25 (OH)2 Total: 19 pg/mL (ref 18–72)
Vitamin D2 1, 25 (OH)2: <8 pg/mL
Vitamin D3 1, 25 (OH)2: 19 pg/mL

## 2023-01-16 ENCOUNTER — Other Ambulatory Visit: Payer: Self-pay | Admitting: Family Medicine

## 2023-01-17 ENCOUNTER — Other Ambulatory Visit: Payer: Self-pay | Admitting: Family Medicine

## 2023-01-17 DIAGNOSIS — I10 Essential (primary) hypertension: Secondary | ICD-10-CM

## 2023-03-28 ENCOUNTER — Encounter: Payer: Self-pay | Admitting: Plastic Surgery

## 2023-03-28 ENCOUNTER — Ambulatory Visit (INDEPENDENT_AMBULATORY_CARE_PROVIDER_SITE_OTHER): Payer: 59 | Admitting: Plastic Surgery

## 2023-03-28 DIAGNOSIS — N62 Hypertrophy of breast: Secondary | ICD-10-CM

## 2023-03-28 NOTE — Progress Notes (Signed)
   Subjective:    Patient ID: [name], female    DOB: 06/04/1970, 53 y.o.   MRN: [id number]  The patient is a 53 year old female here for follow-up after undergoing bilateral breast reduction on September 27.  She had 1400 g removed from both breasts.  Overall she is doing well and is healing nicely.  She does still have some pain on the right lateral breast which does not require medication but is a daily occurrence.  This could be fat necrosis but it is difficult to tell.  She also has some tightness in her paraspinal muscles which could be contributing to the discomfort.  Her right breast is a bit larger than her left and does not have the same shape.  Revision may be in order.    Review of Systems  Constitutional:  Positive for activity change. Negative for appetite change.  Eyes: Negative.   Respiratory: Negative.  Negative for chest tightness.   Cardiovascular: Negative.   Gastrointestinal: Negative.   Genitourinary: Negative.   Skin: Negative.        Objective:   Physical Exam Constitutional:      Appearance: Normal appearance.  HENT:     Head: Atraumatic.  Cardiovascular:     Rate and Rhythm: Normal rate.  Pulmonary:     Effort: Pulmonary effort is normal.  Skin:    General: Skin is warm.     Capillary Refill: Capillary refill takes less than 2 seconds.     Coloration: Skin is not jaundiced.     Findings: No bruising or lesion.  Neurological:     Mental Status: She is alert and oriented to person, place, and time.  Psychiatric:        Mood and Affect: Mood normal.        Thought Content: Thought content normal.        Judgment: Judgment normal.         Assessment & Plan:     ICD-10-CM   1. Symptomatic mammary hypertrophy  N62       I have sent the patient information for the [facility] massage therapy.  She is going to try that a couple times.  Then we will see her back in the next few months and talk about revision to the right side.  Pictures were  obtained of the patient and placed in the chart with the patient's or guardian's permission.

## 2023-05-17 ENCOUNTER — Other Ambulatory Visit: Payer: Self-pay | Admitting: Family Medicine

## 2023-05-30 ENCOUNTER — Encounter: Payer: Self-pay | Admitting: Plastic Surgery

## 2023-05-30 ENCOUNTER — Ambulatory Visit (INDEPENDENT_AMBULATORY_CARE_PROVIDER_SITE_OTHER): Payer: 59 | Admitting: Plastic Surgery

## 2023-05-30 DIAGNOSIS — N62 Hypertrophy of breast: Secondary | ICD-10-CM | POA: Diagnosis not present

## 2023-05-30 NOTE — Progress Notes (Signed)
   Subjective:    Patient ID: [name], female    DOB: 05/29/1970, 53 y.o.   MRN: [id number]  The patient is a 53 year old female joining me by phone for discussion about her breast and the pain she has had.  She had a massage and that did seem to help.  She describes the pain as being intermittent and persistent but does not stop her from doing any of her daily activities.  I gave her some options for treatment including pain management consult but she says she does not want to do any of that right now that it does not bother her that much.  She then asked me about some asymmetry which she has quite a bit of chest wall asymmetry on the right compared to the left and she carries her right shoulder much lower than her left.  This may be why there seems to be a little bit of asymmetry.  Looking at the pictures over time she looks really good.  If needed a talk on the right side could be done in the office.      Review of Systems  Constitutional: Negative.   Eyes: Negative.   Respiratory: Negative.    Cardiovascular: Negative.   Genitourinary: Negative.        Objective:   Physical Exam     Assessment & Plan:     ICD-10-CM   1. Symptomatic mammary hypertrophy  N62       Patient is going to think about things and get through the summer and she will let us know if she wants to do anything about the slight asymmetry. I connected with  [name] on 05/30/23 by phone and verified that I am speaking with the correct person using two identifiers. The patient in [place] and I was at the office. We spent 10 min in discussion.   I discussed the limitations of evaluation and management by telemedicine. The patient expressed understanding and agreed to proceed.

## 2023-06-13 NOTE — Assessment & Plan Note (Signed)
 Well controlled, no changes to meds. Encouraged heart healthy diet such as the DASH diet and exercise as tolerated.

## 2023-06-13 NOTE — Assessment & Plan Note (Signed)
 Supplement and monitor

## 2023-06-13 NOTE — Assessment & Plan Note (Signed)
 Patient encouraged to maintain heart healthy diet, regular exercise, adequate sleep. Consider daily probiotics. Take medications as prescribed. Labs ordered and reviewed. Colonoscopy February 2022 repeat in 2032. Given and reviewed copy of ACP documents from [employer] and encouraged to complete and return  MGM follows with [facility] [place] [facility], last one 10/24, Davee Erm, records requested, she had TAH no paps needed

## 2023-06-13 NOTE — Assessment & Plan Note (Signed)
 hgba1c acceptable, minimize simple carbs. Increase exercise as tolerated.

## 2023-06-13 NOTE — Assessment & Plan Note (Signed)
 Hydrate and monitor

## 2023-06-13 NOTE — Assessment & Plan Note (Signed)
 Encourage heart healthy diet such as MIND or DASH diet, increase exercise, avoid trans fats, simple carbohydrates and processed foods, consider a krill or fish or flaxseed oil cap daily.

## 2023-06-15 ENCOUNTER — Encounter: Payer: Self-pay | Admitting: Family Medicine

## 2023-06-15 ENCOUNTER — Ambulatory Visit (INDEPENDENT_AMBULATORY_CARE_PROVIDER_SITE_OTHER): Payer: 59 | Admitting: Family Medicine

## 2023-06-15 VITALS — BP 116/70 | HR 70 | Temp 97.9°F | Resp 16 | Ht 64.0 in | Wt 231.2 lb

## 2023-06-15 DIAGNOSIS — R739 Hyperglycemia, unspecified: Secondary | ICD-10-CM | POA: Diagnosis not present

## 2023-06-15 DIAGNOSIS — I10 Essential (primary) hypertension: Secondary | ICD-10-CM

## 2023-06-15 DIAGNOSIS — R252 Cramp and spasm: Secondary | ICD-10-CM | POA: Diagnosis not present

## 2023-06-15 DIAGNOSIS — E785 Hyperlipidemia, unspecified: Secondary | ICD-10-CM | POA: Diagnosis not present

## 2023-06-15 DIAGNOSIS — E559 Vitamin D deficiency, unspecified: Secondary | ICD-10-CM

## 2023-06-15 DIAGNOSIS — Z0000 Encounter for general adult medical examination without abnormal findings: Secondary | ICD-10-CM

## 2023-06-15 LAB — COMPREHENSIVE METABOLIC PANEL WITH GFR
ALT: 23 U/L (ref 0–35)
AST: 16 U/L (ref 0–37)
Albumin: 4.7 g/dL (ref 3.5–5.2)
Alkaline Phosphatase: 64 U/L (ref 39–117)
BUN: 10 mg/dL (ref 6–23)
CO2: 28 meq/L (ref 19–32)
Calcium: 9.8 mg/dL (ref 8.4–10.5)
Chloride: 100 meq/L (ref 96–112)
Creatinine, Ser: 0.52 mg/dL (ref 0.40–1.20)
GFR: 105.3 mL/min (ref >60.00)
Glucose, Bld: 96 mg/dL (ref 70–99)
Potassium: 4 meq/L (ref 3.5–5.1)
Sodium: 137 meq/L (ref 135–145)
Total Bilirubin: 0.6 mg/dL (ref 0.2–1.2)
Total Protein: 7 g/dL (ref 6.0–8.3)

## 2023-06-15 LAB — CBC WITH DIFFERENTIAL/PLATELET
Basophils Absolute: 0.1 10*3/uL (ref 0.0–0.1)
Basophils Relative: 0.7 % (ref 0.0–3.0)
Eosinophils Absolute: 0.5 10*3/uL (ref 0.0–0.7)
Eosinophils Relative: 6.1 % — ABNORMAL HIGH (ref 0.0–5.0)
HCT: 42.2 % (ref 36.0–46.0)
Hemoglobin: 14.7 g/dL (ref 12.0–15.0)
Lymphocytes Relative: 32 % (ref 12.0–46.0)
Lymphs Abs: 2.4 10*3/uL (ref 0.7–4.0)
MCHC: 34.8 g/dL (ref 30.0–36.0)
MCV: 86.9 fl (ref 78.0–100.0)
Monocytes Absolute: 0.4 10*3/uL (ref 0.1–1.0)
Monocytes Relative: 5.7 % (ref 3.0–12.0)
Neutro Abs: 4.2 10*3/uL (ref 1.4–7.7)
Neutrophils Relative %: 55.5 % (ref 43.0–77.0)
Platelets: 373 10*3/uL (ref 150.0–400.0)
RBC: 4.86 Mil/uL (ref 3.87–5.11)
RDW: 13.8 % (ref 11.5–15.5)
WBC: 7.5 10*3/uL (ref 4.0–10.5)

## 2023-06-15 LAB — LIPID PANEL
Cholesterol: 182 mg/dL (ref 0–200)
HDL: 46.3 mg/dL (ref >39.00)
LDL Cholesterol: 103 mg/dL — ABNORMAL HIGH (ref 0–99)
NonHDL: 135.65
Total CHOL/HDL Ratio: 4
Triglycerides: 162 mg/dL — ABNORMAL HIGH (ref 0.0–149.0)
VLDL: 32.4 mg/dL (ref 0.0–40.0)

## 2023-06-15 LAB — TSH: TSH: 1.59 u[IU]/mL (ref 0.35–5.50)

## 2023-06-15 LAB — HEMOGLOBIN A1C: Hgb A1c MFr Bld: 5.9 % (ref 4.6–6.5)

## 2023-06-15 MED ORDER — TIRZEPATIDE-WEIGHT MANAGEMENT 2.5 MG/0.5ML ~~LOC~~ SOLN: 2.5000 mg | SUBCUTANEOUS | 1 refills | Status: DC

## 2023-06-15 NOTE — Patient Instructions (Addendum)
 Tetanus due in August 2025 or sooner if injured  In 6-7 weeks let us know if are tolerating the Zepbound and are ready to 5 mg send [place] a note  Magnesium Glycinate 200-400 mg at bedtime  Recommend calcium intake of 1200 to 1500 mg daily, divided into roughly 3 doses. Best source is the diet and a single dairy serving is about 500 mg, a supplement of calcium citrate once or twice daily to balance diet is fine if not getting enough in diet. Also need Vitamin D 2000 IU caps, 1 cap daily if not already taking vitamin D. Also recommend weight baring exercise on hips and upper body to keep bones strong   Multistrain probiotic daily, NOW, Pendulum, MindBodyGreen   Preventive Care 98-29 Years Old, Female Preventive care refers to lifestyle choices and visits with your health care provider that can promote health and wellness. Preventive care visits are also called wellness exams. What can I expect for my preventive care visit? Counseling Your health care provider may ask you questions about your: Medical history, including: Past medical problems. Family medical history. Pregnancy history. Current health, including: Menstrual cycle. Method of birth control. Emotional well-being. Home life and relationship well-being. Sexual activity and sexual health. Lifestyle, including: Alcohol, nicotine or tobacco, and drug use. Access to firearms. Diet, exercise, and sleep habits. Work and work Astronomer. Sunscreen use. Safety issues such as seatbelt and bike helmet use. Physical exam Your health care provider will check your: Height and weight. These may be used to calculate your BMI (body mass index). BMI is a measurement that tells if you are at a healthy weight. Waist circumference. This measures the distance around your waistline. This measurement also tells if you are at a healthy weight and may help predict your risk of certain diseases, such as type 2 diabetes and high blood pressure. Heart  rate and blood pressure. Body temperature. Skin for abnormal spots. What immunizations do I need?  Vaccines are usually given at various ages, according to a schedule. Your health care provider will recommend vaccines for you based on your age, medical history, and lifestyle or other factors, such as travel or where you work. What tests do I need? Screening Your health care provider may recommend screening tests for certain conditions. This may include: Lipid and cholesterol levels. Diabetes screening. This is done by checking your blood sugar (glucose) after you have not eaten for a while (fasting). Pelvic exam and Pap test. Hepatitis B test. Hepatitis C test. HIV (human immunodeficiency virus) test. STI (sexually transmitted infection) testing, if you are at risk. Lung cancer screening. Colorectal cancer screening. Mammogram. Talk with your health care provider about when you should start having regular mammograms. This may depend on whether you have a family history of breast cancer. BRCA-related cancer screening. This may be done if you have a family history of breast, ovarian, tubal, or peritoneal cancers. Bone density scan. This is done to screen for osteoporosis. Talk with your health care provider about your test results, treatment options, and if necessary, the need for more tests. Follow these instructions at home: Eating and drinking  Eat a diet that includes fresh fruits and vegetables, whole grains, lean protein, and low-fat dairy products. Take vitamin and mineral supplements as recommended by your health care provider. Do not drink alcohol if: Your health care provider tells you not to drink. You are pregnant, may be pregnant, or are planning to become pregnant. If you drink alcohol: Limit how much you  have to 0-1 drink a day. Know how much alcohol is in your drink. In the [place] one drink equals one 12 oz bottle of beer (355 mL), one 5 oz glass of wine (148 mL), or one  1 oz glass of hard liquor (44 mL). Lifestyle Brush your teeth every morning and night with fluoride toothpaste. Floss one time each day. Exercise for at least 30 minutes 5 or more days each week. Do not use any products that contain nicotine or tobacco. These products include cigarettes, chewing tobacco, and vaping devices, such as e-cigarettes. If you need help quitting, ask your health care provider. Do not use drugs. If you are sexually active, practice safe sex. Use a condom or other form of protection to prevent STIs. If you do not wish to become pregnant, use a form of birth control. If you plan to become pregnant, see your health care provider for a prepregnancy visit. Take aspirin only as told by your health care provider. Make sure that you understand how much to take and what form to take. Work with your health care provider to find out whether it is safe and beneficial for you to take aspirin daily. Find healthy ways to manage stress, such as: Meditation, yoga, or listening to music. Journaling. Talking to a trusted person. Spending time with friends and family. Minimize exposure to UV radiation to reduce your risk of skin cancer. Safety Always wear your seat belt while driving or riding in a vehicle. Do not drive: If you have been drinking alcohol. Do not ride with someone who has been drinking. When you are tired or distracted. While texting. If you have been using any mind-altering substances or drugs. Wear a helmet and other protective equipment during sports activities. If you have firearms in your house, make sure you follow all gun safety procedures. Seek help if you have been physically or sexually abused. What's next? Visit your health care provider once a year for an annual wellness visit. Ask your health care provider how often you should have your eyes and teeth checked. Stay up to date on all vaccines. This information is not intended to replace advice given to  you by your health care provider. Make sure you discuss any questions you have with your health care provider. Document Revised: 08/12/2020 Document Reviewed: 08/12/2020 Elsevier Patient Education  2024 [employer].

## 2023-06-16 ENCOUNTER — Encounter: Payer: Self-pay | Admitting: Family Medicine

## 2023-06-16 NOTE — Progress Notes (Signed)
 Subjective:    Patient ID: [name], female    DOB: 24-Sep-1970, 53 y.o.   MRN: [id number]  Chief Complaint  Patient presents with   Annual Exam    HPI Discussed the use of AI scribe software for clinical note transcription with the patient, who gave verbal consent to proceed.  History of Present Illness [name] is a 53 year old female presenting for a routine follow-up visit.  She struggles with weight management, feeling frustrated despite not eating excessively. She acknowledges a lack of regular exercise and recently attempted physical activity, which resulted in back pain requiring physical therapy for four weeks. She has a history of lower back issues since her twenties.  She experiences gastrointestinal symptoms, including morning stomach cramps that improve with eating. She manages these symptoms with Metamucil at night, adequate hydration, and a probiotic, which have helped reduce the frequency of feeling unwell. She suspects she might have irritable bowel syndrome, as she has experienced these symptoms for years.  She has a history of a total hysterectomy with bilateral oophorectomy approximately 14 years ago and is currently using estrogen therapy. She is uncertain about how this affects her aging process.  She experiences pain under her right shoulder blade, which she associates with her posture while reading. She has tried massage therapy, which did not alleviate the pain, and is considering further sessions.  She has a history of breast reduction surgery over a year ago and reports ongoing pain on one side, which she attributes to muscle issues. A recent mammogram showed no abnormalities.  She takes vitamin D  supplements regularly and has recently started taking calcium magnesium malate. She is considering the benefits of magnesium for sleep and is exploring different types of probiotics for gastrointestinal health.    Past Medical History:  Diagnosis Date    Acute mastitis of left breast 10/04/2007   Allergy    seasonal allergies   AMA (advanced maternal age) multigravida 35+    Cancer (HCC) 2002   Left leg melanoma   Celiac disease    Chronic back pain    H/O   Complication of anesthesia    Difficult time waking up   Constipation in pregnancy 03/2006   Endometriosis    h/o   Environmental allergies    Family history of breast cancer    Family history of ovarian cancer    Fibroid    During first pregnancy   Fibromyalgia    pt denies dx   First trimester bleeding 2008   GBS carrier    GERD (gastroesophageal reflux disease)    with certain foods-OTC PRN   Granuloma annulare    H/O amenorrhea 10/2006   H/O blood clots    7 yr ago in arm   H/O varicella    As an infant.   Headache(784.0)    Hearing loss 08/15/2016   Heart murmur    heard occassional at MD office   History of melanoma    HTN (hypertension)    on meds   Hyperlipidemia 03/02/2017   diet controlled   IBS (irritable bowel syndrome) 08/15/2016   Irregular uterine bleeding    Joint pain    Lactose intolerance    Leg edema    Leg pain, left 02/09/2016   Monilial vaginitis 07/27/2004   Palpitations    Rh negative status during pregnancy    SAB (spontaneous abortion)    h/o x 2   Seasonal allergies    Sleep apnea 08/15/2016  no CPAP use- uses nightguard   TMJ syndrome    Vegetarianism 08/15/2016   Vitamin D  deficiency 08/16/2015    Past Surgical History:  Procedure Laterality Date   ABDOMINAL HYSTERECTOMY  03/01/2011   APPENDECTOMY  02/29/2000   BREAST REDUCTION SURGERY Bilateral 11/24/2021   Procedure: BREAST REDUCTION WITH LIPOSUCTION;  Surgeon: Thornell Flirt, DO;  Location: [facility];  Service: Plastics;  Laterality: Bilateral;   DILATION AND EVACUATION     endocervical polyp removal  03/01/1999   ENDOMETRIAL ABLATION  02/29/2008   HERNIA REPAIR  05/16/2012   incisional hernia repair   HYSTEROSCOPY W/ ENDOMETRIAL  ABLATION  01/28/2009   INSERTION OF MESH N/A 05/16/2012   Procedure: INSERTION OF MESH;  Surgeon: Brandy Cal. Cornett, MD;  Location: [facility];  Service: General;  Laterality: N/A;   KNEE ARTHROSCOPY Left 12/05/2012   Procedure: LEFT KNEE ARTHROSCOPY;  Surgeon: Ilean Mall, MD;  Location: [facility];  Service: Orthopedics;  Laterality: Left;   MELANOMA EXCISION  02/29/2000   Left Leg   melanoma removal   02/28/2001   REDUCTION MAMMAPLASTY Bilateral    10/2021   REPLACEMENT TOTAL KNEE BILATERAL Bilateral    SALPINGOOPHORECTOMY  12/08/2011   Procedure: SALPINGO OOPHERECTOMY;  Surgeon: Stevenson Elbe, MD;  Location: [facility];  Service: Gynecology;  Laterality: Bilateral;   TENDON RELEASE  02/28/2002   uterine curettage  01/28/2009   VENTRAL HERNIA REPAIR N/A 05/16/2012   Procedure: LAPAROSCOPIC VENTRAL HERNIA;  Surgeon: Andy Bannister A. Cornett, MD;  Location: [facility];  Service: General;  Laterality: N/A;   WISDOM TOOTH EXTRACTION  03/01/1987    Family History  Problem Relation Age of Onset   Hypertension Mother    Heart disease Mother    Anxiety disorder Mother    Breast cancer Mother 79   Heart disease Father        atrial fib   Hypertension Father    Atrial fibrillation Father    Obesity Father    Bladder Cancer Father 89   Lymphoma Father 81   Diverticulitis Brother    Ovarian cancer Maternal Aunt 50   Breast cancer Maternal Grandmother 58   Heart disease Maternal Grandfather        MI   Heart disease Paternal Grandfather        MI   Leukemia Nephew 15       AML and ALL   Colon cancer Neg Hx    Colon polyps Neg Hx    Esophageal cancer Neg Hx    Rectal cancer Neg Hx    Stomach cancer Neg Hx     Social History   Socioeconomic History   Marital status: Married    Spouse name: Mining engineer   Number of children: 3   Years of education: Not on file   Highest education level: Not on file  Occupational History   Occupation: Psychologist, sport and exercise  Tobacco Use    Smoking status: Former    Current packs/day: 0.00    Types: Cigarettes    Quit date: 04/26/2020    Years since quitting: 3.1   Smokeless tobacco: Never   Tobacco comments:    1-3 cigarrettes per week  Vaping Use   Vaping status: Never Used  Substance and Sexual Activity   Alcohol use: No   Drug use: No   Sexual activity: Yes    Birth control/protection: Surgical    Comment: BTL  Other Topics Concern   Not on file  Social History Narrative   ** Merged History Encounter **       Lives with husband and 3 children 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 Wears seat belt   Social Drivers of Corporate investment banker Strain: Not on file  Food Insecurity: Not on file  Transportation Needs: Not on file  Physical Activity: Not on file  Stress: Not on file  Social Connections: Not on file  Intimate Partner Violence: Not on file    Outpatient Medications Prior to Visit  Medication Sig Dispense Refill   amLODipine  (NORVASC ) 2.5 MG tablet Take 1 tablet (2.5 mg total) by mouth daily. 90 tablet 1   Cetirizine HCl (ZYRTEC PO) Take 1 tablet by mouth daily.     cholecalciferol (VITAMIN D3) 25 MCG (1000 UNIT) tablet Take 1,000 Units by mouth daily.     estradiol  (CLIMARA  - DOSED IN MG/24 HR) 0.0375 mg/24hr patch APPLY 1 PATCH TOPICALLY ONCE A  WEEK 4 patch 3   hydrochlorothiazide  (HYDRODIURIL ) 25 MG tablet Take 0.5-1 tablets (12.5-25 mg total) by mouth daily. 90 tablet 1   lisinopril  (ZESTRIL ) 20 MG tablet TAKE 1 TABLET BY MOUTH IN THE  MORNING AND AT BEDTIME 180 tablet 3   MELATONIN PO Take by mouth.     Misc. Devices (DENTAL GUARD) MISC 1 Device by Does not apply route See admin instructions. 1 each 0   TURMERIC CURCUMIN PO Take by mouth.     lisinopril  (ZESTRIL ) 20 MG tablet Take 1 tablet (20 mg total) by mouth in the morning and at bedtime. 180 tablet 0   No facility-administered medications prior to visit.    Allergies  Allergen Reactions   Cymbalta  [Duloxetine  Hcl] Other  (See Comments)    Makes her feel crazy, out of body   Betadine [Povidone Iodine] Other (See Comments)    Dx with allergy testing   Iodine    Shellfish Allergy Other (See Comments)    Dx with allergy testing   Prednisone  Rash    Flushing    Review of Systems  Constitutional:  Negative for chills, fever and malaise/fatigue.  HENT:  Negative for congestion and hearing loss.   Eyes:  Negative for discharge.  Respiratory:  Negative for cough, sputum production and shortness of breath.   Cardiovascular:  Positive for chest pain. Negative for palpitations and leg swelling.  Gastrointestinal:  Positive for abdominal pain. Negative for blood in stool, constipation, diarrhea, heartburn, nausea and vomiting.  Genitourinary:  Negative for dysuria, frequency, hematuria and urgency.  Musculoskeletal:  Positive for back pain and myalgias. Negative for falls.  Skin:  Negative for rash.  Neurological:  Negative for dizziness, sensory change, loss of consciousness, weakness and headaches.  Endo/Heme/Allergies:  Negative for environmental allergies. Does not bruise/bleed easily.  Psychiatric/Behavioral:  Negative for depression and suicidal ideas. The patient is not nervous/anxious and does not have insomnia.        Objective:    Physical Exam Constitutional:      General: She is not in acute distress.    Appearance: Normal appearance. She is not diaphoretic.  HENT:     Head: Normocephalic and atraumatic.     Right Ear: Tympanic membrane, ear canal and external ear normal.     Left Ear: Tympanic membrane, ear canal and external ear normal.     Nose: Nose normal.     Mouth/Throat:     Mouth: Mucous membranes are moist.     Pharynx: Oropharynx is clear. No oropharyngeal exudate.  Eyes:     General: No scleral icterus.       Right eye: No discharge.        Left eye: No discharge.     Conjunctiva/sclera: Conjunctivae normal.     Pupils: Pupils are equal, round, and reactive to light.  Neck:      Thyroid : No thyromegaly.  Cardiovascular:     Rate and Rhythm: Normal rate and regular rhythm.     Heart sounds: Normal heart sounds. No murmur heard. Pulmonary:     Effort: Pulmonary effort is normal. No respiratory distress.     Breath sounds: Normal breath sounds. No wheezing or rales.  Abdominal:     General: Bowel sounds are normal. There is no distension.     Palpations: Abdomen is soft. There is no mass.     Tenderness: There is no abdominal tenderness.  Musculoskeletal:        General: No tenderness. Normal range of motion.     Cervical back: Normal range of motion and neck supple.  Lymphadenopathy:     Cervical: No cervical adenopathy.  Skin:    General: Skin is warm and dry.     Findings: No rash.  Neurological:     General: No focal deficit present.     Mental Status: She is alert and oriented to person, place, and time.     Cranial Nerves: No cranial nerve deficit.     Coordination: Coordination normal.     Deep Tendon Reflexes: Reflexes are normal and symmetric. Reflexes normal.  Psychiatric:        Mood and Affect: Mood normal.        Behavior: Behavior normal.        Thought Content: Thought content normal.        Judgment: Judgment normal.     BP 116/70 (BP Location: Left Arm, Patient Position: Sitting, Cuff Size: Large)   Pulse 70   Temp 97.9 F (36.6 C) (Oral)   Resp 16   Ht 5\' 4"  (1.626 m)   Wt 231 lb 3.2 oz (104.9 kg)   LMP 11/19/2010   SpO2 94%   BMI 39.69 kg/m  Wt Readings from Last 3 Encounters:  06/15/23 231 lb 3.2 oz (104.9 kg)  12/19/22 232 lb 3.2 oz (105.3 kg)  09/20/22 229 lb 3.2 oz (104 kg)    Diabetic Foot Exam - Simple   No data filed    Lab Results  Component Value Date   WBC 7.5 06/15/2023   HGB 14.7 06/15/2023   HCT 42.2 06/15/2023   PLT 373.0 06/15/2023   GLUCOSE 96 06/15/2023   CHOL 182 06/15/2023   TRIG 162.0 (H) 06/15/2023   HDL 46.30 06/15/2023   LDLCALC 103 (H) 06/15/2023   ALT 23 06/15/2023   AST 16  06/15/2023   NA 137 06/15/2023   K 4.0 06/15/2023   CL 100 06/15/2023   CREATININE 0.52 06/15/2023   BUN 10 06/15/2023   CO2 28 06/15/2023   TSH 1.59 06/15/2023   INR 1.0 11/03/2020   HGBA1C 5.9 06/15/2023    Lab Results  Component Value Date   TSH 1.59 06/15/2023   Lab Results  Component Value Date   WBC 7.5 06/15/2023   HGB 14.7 06/15/2023   HCT 42.2 06/15/2023   MCV 86.9 06/15/2023   PLT 373.0 06/15/2023   Lab Results  Component Value Date   NA 137 06/15/2023   K 4.0 06/15/2023   CO2 28 06/15/2023   GLUCOSE  96 06/15/2023   BUN 10 06/15/2023   CREATININE 0.52 06/15/2023   BILITOT 0.6 06/15/2023   ALKPHOS 64 06/15/2023   AST 16 06/15/2023   ALT 23 06/15/2023   PROT 7.0 06/15/2023   ALBUMIN 4.7 06/15/2023   CALCIUM 9.8 06/15/2023   ANIONGAP 9 11/16/2021   GFR 105.30 06/15/2023   Lab Results  Component Value Date   CHOL 182 06/15/2023   Lab Results  Component Value Date   HDL 46.30 06/15/2023   Lab Results  Component Value Date   LDLCALC 103 (H) 06/15/2023   Lab Results  Component Value Date   TRIG 162.0 (H) 06/15/2023   Lab Results  Component Value Date   CHOLHDL 4 06/15/2023   Lab Results  Component Value Date   HGBA1C 5.9 06/15/2023       Assessment & Plan:  Essential hypertension Assessment & Plan: Well controlled, no changes to meds. Encouraged heart healthy diet such as the DASH diet and exercise as tolerated.     Hyperglycemia Assessment & Plan: hgba1c acceptable, minimize simple carbs. Increase exercise as tolerated.   Orders: -     Comprehensive metabolic panel with GFR; Future -     TSH; Future -     Hemoglobin A1c; Future  Hyperlipidemia, unspecified hyperlipidemia type Assessment & Plan: Encourage heart healthy diet such as MIND or DASH diet, increase exercise, avoid trans fats, simple carbohydrates and processed foods, consider a krill or fish or flaxseed oil cap daily.   Orders: -     Lipid panel; Future -     TSH;  Future  Muscle cramps Assessment & Plan: Hydrate and monitor   Orders: -     CBC with Differential/Platelet; Future  Vitamin D  deficiency Assessment & Plan: Supplement and monitor  Orders: -     Vitamin D  1,25 dihydroxy; Future  Preventative health care Assessment & Plan: Patient encouraged to maintain heart healthy diet, regular exercise, adequate sleep. Consider daily probiotics. Take medications as prescribed. Labs ordered and reviewed. Colonoscopy February 2022 repeat in 2032. Given and reviewed copy of ACP documents from [employer] and encouraged to complete and return  MGM follows with [place] [facility], last one 10/24, Davee Erm, records requested, she had TAH no paps needed     Other orders -     Tirzepatide -Weight Management; Inject 2.5 mg into the skin once a week.  Dispense: 2 mL; Refill: 1    Assessment and Plan Assessment & Plan Obesity Struggling with weight loss despite dietary changes. Discussed genetic and metabolic factors. Considering Zepbound  for weight loss. Explained benefits and side effects. - Order blood work to monitor glucose levels. - Submit prescription for Zepbound  and check insurance coverage. - Discuss lifestyle modifications including diet and exercise.  Chronic back pain Chronic pain under shoulder blade, possibly musculoskeletal. Discussed posture and activity impact. - Recommend massage therapy. - Consider physical therapy or chiropractic care. - Encourage stretching and exercise.  Breast pain post-reduction surgery Right-sided breast pain post-surgery. Plastic surgeon suggested muscle involvement. Recent mammogram normal. - Consider regular massage therapy.  Irritable Bowel Syndrome (IBS) Morning abdominal cramps improved with eating. Managing with Metamucil, probiotics, and dietary adjustments. - Continue Metamucil and probiotics. - Encourage dietary adjustments to manage symptoms.  General Health  Maintenance Discussed healthy lifestyle, advanced directives, and supplementation. Recommended magnesium glycinate and calcium citrate. - Provide information on advanced directives and healthcare power of attorney. - Encourage regular exercise and healthy diet. - Discuss calcium and magnesium  supplementation. - Consider pelvic exam every 4-5 years.     Randie Bustle, MD

## 2023-06-19 ENCOUNTER — Other Ambulatory Visit: Payer: Self-pay | Admitting: Emergency Medicine

## 2023-06-19 ENCOUNTER — Other Ambulatory Visit: Payer: Self-pay | Admitting: Family Medicine

## 2023-06-19 ENCOUNTER — Other Ambulatory Visit (HOSPITAL_BASED_OUTPATIENT_CLINIC_OR_DEPARTMENT_OTHER): Payer: Self-pay

## 2023-06-19 LAB — VITAMIN D 1,25 DIHYDROXY
Vitamin D 1, 25 (OH)2 Total: 47 pg/mL (ref 18–72)
Vitamin D2 1, 25 (OH)2: <8 pg/mL
Vitamin D3 1, 25 (OH)2: 47 pg/mL

## 2023-06-19 MED ORDER — TIRZEPATIDE-WEIGHT MANAGEMENT 2.5 MG/0.5ML ~~LOC~~ SOAJ
2.5000 mg | SUBCUTANEOUS | 1 refills | Status: DC
  Filled 2023-06-19: qty 2, 28d supply, fill #0

## 2023-06-19 MED ORDER — TIRZEPATIDE-WEIGHT MANAGEMENT 2.5 MG/0.5ML ~~LOC~~ SOAJ
2.5000 mg | SUBCUTANEOUS | 1 refills | Status: DC
  Filled 2023-06-19 – 2023-06-20 (×2): qty 2, 28d supply, fill #0

## 2023-06-19 NOTE — Progress Notes (Signed)
 Reviewed via MyChart

## 2023-06-20 ENCOUNTER — Telehealth: Payer: Self-pay | Admitting: Pharmacy Technician

## 2023-06-20 ENCOUNTER — Other Ambulatory Visit (HOSPITAL_BASED_OUTPATIENT_CLINIC_OR_DEPARTMENT_OTHER): Payer: Self-pay

## 2023-06-20 ENCOUNTER — Other Ambulatory Visit (HOSPITAL_COMMUNITY): Payer: Self-pay

## 2023-06-20 NOTE — Telephone Encounter (Signed)
 Called patient and let her know medication is not covered by her insuance.

## 2023-06-20 NOTE — Telephone Encounter (Signed)
 Pharmacy Patient Advocate Encounter   Received notification from CoverMyMeds that prior authorization for Zepbound  2.5MG /0.5ML pen-injectors is required/requested.   Insurance verification completed.   The patient is insured through [facility] .   Per test claim: PRODUCT/SERVICE NOT COVERED-PLAN BENEFIT EXCLUSION. HAVE PATIENT CONTACT HER HEALTH PLAN TO INQUIRE ABOUT COVERED ALTERNATIVES.    CMM Key: [id number]

## 2023-06-21 ENCOUNTER — Other Ambulatory Visit (HOSPITAL_BASED_OUTPATIENT_CLINIC_OR_DEPARTMENT_OTHER): Payer: Self-pay

## 2023-06-27 ENCOUNTER — Other Ambulatory Visit: Payer: Self-pay | Admitting: Family Medicine

## 2023-07-25 ENCOUNTER — Encounter: Payer: Self-pay | Admitting: Family Medicine

## 2023-07-26 ENCOUNTER — Telehealth

## 2023-07-26 ENCOUNTER — Ambulatory Visit (INDEPENDENT_AMBULATORY_CARE_PROVIDER_SITE_OTHER): Admitting: Family Medicine

## 2023-07-26 VITALS — BP 107/71 | HR 97 | Ht 64.0 in | Wt 235.0 lb

## 2023-07-26 DIAGNOSIS — G44209 Tension-type headache, unspecified, not intractable: Secondary | ICD-10-CM

## 2023-07-26 DIAGNOSIS — M26609 Unspecified temporomandibular joint disorder, unspecified side: Secondary | ICD-10-CM

## 2023-07-26 MED ORDER — MELOXICAM 15 MG PO TABS: 15.0000 mg | ORAL_TABLET | Freq: Every day | ORAL | 0 refills | Status: AC

## 2023-07-26 MED ORDER — CYCLOBENZAPRINE HCL 5 MG PO TABS: 5.0000 mg | ORAL_TABLET | Freq: Three times a day (TID) | ORAL | 1 refills | Status: AC | PRN

## 2023-07-26 NOTE — Progress Notes (Signed)
 Acute Office Visit  Subjective:     Patient ID: [name], female    DOB: [birth year], 53 y.o.   MRN: [id number]  Chief Complaint  Patient presents with   Headache    HPI Patient is in today for headache, TMJ flare.   Discussed the use of AI scribe software for clinical note transcription with the patient, who gave verbal consent to proceed.  History of Present Illness [name] is a 53 year old female with TMJ disorder who presents with severe headache and jaw pain.  She has been experiencing a severe headache and jaw pain since Monday of last week. The headache is debilitating, primarily located on the right side and sometimes behind her eyes, with a sensation of 'blowing up'. It is associated with postnasal drainage but no nasal congestion. No fever, nasal congestion, significant ear symptoms, blurry vision, or neurologic symptoms. She reports drainage when lying down and an inability to focus mentally after a prolonged headache.  She has a history of TMJ disorder since her teenage years, affecting both sides of her jaw. She wears a bite guard at night, which may not be properly aligned, as she experiences clenching on one side more than the other. She has previously seen a TMJ specialist and has an upcoming appointment next week.  For the current episode, she has tried various medications including Tylenol , ibuprofen , and Sudafed, with minimal relief. Naratriptan provided temporary relief for almost 24 hours, but the headache returned worse. Last night, she took a muscle relaxer, cyclobenzaprine (Flexeril), which improved her symptoms significantly, allowing her to sleep better and wake up without a headache.          ROS All review of systems negative except what is listed in the HPI      Objective:    BP 107/71   Pulse 97   Ht 5\' 4"  (1.626 m)   Wt 235 lb (106.6 kg)   LMP 11/19/2010   SpO2 96%   BMI 40.34 kg/m    Physical Exam Vitals reviewed.   Constitutional:      Appearance: She is well-developed.  HENT:     Head: Normocephalic and atraumatic.      Comments: Palpable muscle tension bilaterally and some slight discomfort (R>L) on palpation, no clicking palpated with jaw movement Eyes:     General: No visual field deficit.    Extraocular Movements: Extraocular movements intact.     Right eye: Normal extraocular motion.     Left eye: Normal extraocular motion.     Pupils: Pupils are equal, round, and reactive to light.  Musculoskeletal:        General: Normal range of motion.     Cervical back: Normal range of motion and neck supple.  Skin:    General: Skin is warm and dry.  Neurological:     Mental Status: She is alert.     Cranial Nerves: No dysarthria or facial asymmetry.     Motor: No weakness.  Psychiatric:        Mood and Affect: Mood normal.        Speech: Speech normal.        Behavior: Behavior normal.     No results found for any visits on 07/26/23.      Assessment & Plan:   Problem List Items Addressed This Visit       Active Problems   Headache - Primary   Relevant Medications   cyclobenzaprine (FLEXERIL) 5 MG  tablet   meloxicam  (MOBIC ) 15 MG tablet   Other Visit Diagnoses       TMJ (temporomandibular joint disorder)       Relevant Medications   cyclobenzaprine  (FLEXERIL ) 5 MG tablet   meloxicam  (MOBIC ) 15 MG tablet      Assessment & Plan  Tension headache (now resolved) with pain localized to the right side of face, improved with cyclobenzaprine , indicating muscle tension as a likely cause. No neurological symptoms or significant sinus congestion. Long-standing TMJ disorder with bilateral involvement, exacerbated by possible misalignment of night guard, contributing to tension headaches. - Prescribed cyclobenzaprine  5 mg, take 1-2 tablets as needed, starting with one tablet at bedtime. - Prescribed meloxicam  for symptom flare-ups, take once daily if needed. - Consider prednisone  burst  if symptoms become severe, but avoid due to potential side effects. - Follow up with TMJ specialist next week for further evaluation and adjustment of night guard.       Meds ordered this encounter  Medications   cyclobenzaprine  (FLEXERIL ) 5 MG tablet    Sig: Take 1 tablet (5 mg total) by mouth 3 (three) times daily as needed for muscle spasms.    Dispense:  30 tablet    Refill:  1    Supervising Provider:   Randie Bustle A [[id number]]   meloxicam  (MOBIC ) 15 MG tablet    Sig: Take 1 tablet (15 mg total) by mouth daily.    Dispense:  30 tablet    Refill:  0    Supervising Provider:   Randie Bustle A [[id number]]    Return if symptoms worsen or fail to improve.  Everlina Hock, NP

## 2023-08-27 ENCOUNTER — Encounter: Payer: Self-pay | Admitting: Family Medicine

## 2023-08-27 DIAGNOSIS — G4452 New daily persistent headache (NDPH): Secondary | ICD-10-CM

## 2023-08-27 DIAGNOSIS — H9201 Otalgia, right ear: Secondary | ICD-10-CM

## 2023-09-07 ENCOUNTER — Encounter: Payer: Self-pay | Admitting: Family Medicine

## 2023-09-07 DIAGNOSIS — I10 Essential (primary) hypertension: Secondary | ICD-10-CM

## 2023-09-07 MED ORDER — AMLODIPINE BESYLATE 2.5 MG PO TABS: 2.5000 mg | ORAL_TABLET | Freq: Every day | ORAL | 1 refills | Status: DC

## 2023-09-07 MED ORDER — HYDROCHLOROTHIAZIDE 25 MG PO TABS: 12.5000 mg | ORAL_TABLET | Freq: Every day | ORAL | 1 refills | Status: DC

## 2023-09-12 ENCOUNTER — Telehealth: Admitting: Family Medicine

## 2023-09-15 ENCOUNTER — Ambulatory Visit (INDEPENDENT_AMBULATORY_CARE_PROVIDER_SITE_OTHER): Admitting: Audiology

## 2023-09-15 ENCOUNTER — Ambulatory Visit (INDEPENDENT_AMBULATORY_CARE_PROVIDER_SITE_OTHER): Admitting: Physician Assistant

## 2023-09-15 ENCOUNTER — Encounter (INDEPENDENT_AMBULATORY_CARE_PROVIDER_SITE_OTHER): Payer: Self-pay | Admitting: Physician Assistant

## 2023-09-15 VITALS — BP 120/76 | HR 79

## 2023-09-15 DIAGNOSIS — Z0110 Encounter for examination of ears and hearing without abnormal findings: Secondary | ICD-10-CM

## 2023-09-15 DIAGNOSIS — R519 Headache, unspecified: Secondary | ICD-10-CM | POA: Diagnosis not present

## 2023-09-15 DIAGNOSIS — H93291 Other abnormal auditory perceptions, right ear: Secondary | ICD-10-CM

## 2023-09-15 NOTE — Progress Notes (Signed)
 Dear Dr. Almarie, Here is my assessment for our mutual patient, [name]. Thank you for allowing me the opportunity to care for your patient. Please do not hesitate to contact me should you have any other questions. Sincerely, Chyrl Cohen PA-C  Otolaryngology Clinic Note Referring provider: Dr. Almarie HPI:  [name] is a 53 y.o. female kindly referred by Dr. Almarie   The patient is a 53 year old female seen in our office for evaluation of headache and ear pain.  Patient notes that approximate 2 months ago she developed a right sided temporal headache.  She notes a history of the same as a child but outgrew the headaches.  She notes that the headache came, she felt it was in the right temporal region, it lasted several months.  She eventually saw her primary care provider, she was told she may have fluid on her right ear but no signs of infection, she is also told that this potentially be coming from her temporomandibular joint which she does have dysfunction and wears a mouthguard for.  She notes her symptoms continue to persist, she ended up seeing urgent care, she was told her ear looked a little red but not overtly infected, she was placed on amoxicillin.  She notes that the symptoms did resolve but then came back after discontinue the medication.  She again went back to an urgent care, again she was told it looks slightly red, she was placed on cefdinir, she notes this did not improve her symptoms whatsoever.  Did follow-up with her dentist evaluation of the TMJ, she notes her mouthguard is old but still felt so she continued to use this 1.  She was given muscle relaxers but did not find any relief with that.  Today she notes she has not had the headache in the last week.  She describes it as a pain in the right temporal region, she notes potentially some radiation into the right ear but this is not the primary source of her discomfort.  As far as hearing goes she feels like it is at baseline,  she thinks that maybe it gets muffled from time to time when the pain is there but is uncertain.  Noted history of allergies and takes Zyrtec for this.  No associated dizziness or ringing in the ear.  No clicking or popping.  Again she feels her hearing is at baseline.    Independent Review of Additional Tests or Records:  Audiological evaluation on 09/15/2023   Normal audiological evaluation   PMH/Meds/All/SocHx/FamHx/ROS:   Past Medical History:  Diagnosis Date   Acute mastitis of left breast 10/04/2007   Allergy    seasonal allergies   AMA (advanced maternal age) multigravida 35+    Cancer (HCC) 2002   Left leg melanoma   Celiac disease    Chronic back pain    H/O   Complication of anesthesia    Difficult time waking up   Constipation in pregnancy 03/2006   Endometriosis    h/o   Environmental allergies    Family history of breast cancer    Family history of ovarian cancer    Fibroid    During first pregnancy   Fibromyalgia    pt denies dx   First trimester bleeding 2008   GBS carrier    GERD (gastroesophageal reflux disease)    with certain foods-OTC PRN   Granuloma annulare    H/O amenorrhea 10/2006   H/O blood clots    7 yr ago in  arm   H/O varicella    As an infant.   Headache(784.0)    Hearing loss 08/15/2016   Heart murmur    heard occassional at MD office   History of melanoma    HTN (hypertension)    on meds   Hyperlipidemia 03/02/2017   diet controlled   IBS (irritable bowel syndrome) 08/15/2016   Irregular uterine bleeding    Joint pain    Lactose intolerance    Leg edema    Leg pain, left 02/09/2016   Monilial vaginitis 07/27/2004   Palpitations    Rh negative status during pregnancy    SAB (spontaneous abortion)    h/o x 2   Seasonal allergies    Sleep apnea 08/15/2016   no CPAP use- uses nightguard   TMJ syndrome    Vegetarianism 08/15/2016   Vitamin D  deficiency 08/16/2015     Past Surgical History:  Procedure Laterality Date    ABDOMINAL HYSTERECTOMY  03/01/2011   APPENDECTOMY  02/29/2000   BREAST REDUCTION SURGERY Bilateral 11/24/2021   Procedure: BREAST REDUCTION WITH LIPOSUCTION;  Surgeon: Lowery Estefana RAMAN, DO;  Location: [facility];  Service: Plastics;  Laterality: Bilateral;   DILATION AND EVACUATION     endocervical polyp removal  03/01/1999   ENDOMETRIAL ABLATION  02/29/2008   HERNIA REPAIR  05/16/2012   incisional hernia repair   HYSTEROSCOPY W/ ENDOMETRIAL ABLATION  01/28/2009   INSERTION OF MESH N/A 05/16/2012   Procedure: INSERTION OF MESH;  Surgeon: Debby LABOR. Cornett, MD;  Location: [facility];  Service: General;  Laterality: N/A;   KNEE ARTHROSCOPY Left 12/05/2012   Procedure: LEFT KNEE ARTHROSCOPY;  Surgeon: Dempsey JINNY Sensor, MD;  Location: [facility];  Service: Orthopedics;  Laterality: Left;   MELANOMA EXCISION  02/29/2000   Left Leg   melanoma removal   02/28/2001   REDUCTION MAMMAPLASTY Bilateral    10/2021   REPLACEMENT TOTAL KNEE BILATERAL Bilateral    SALPINGOOPHORECTOMY  12/08/2011   Procedure: SALPINGO OOPHERECTOMY;  Surgeon: Shanda SHAUNNA Muscat, MD;  Location: [facility];  Service: Gynecology;  Laterality: Bilateral;   TENDON RELEASE  02/28/2002   uterine curettage  01/28/2009   VENTRAL HERNIA REPAIR N/A 05/16/2012   Procedure: LAPAROSCOPIC VENTRAL HERNIA;  Surgeon: Debby A. Cornett, MD;  Location: [facility];  Service: General;  Laterality: N/A;   WISDOM TOOTH EXTRACTION  03/01/1987    Family History  Problem Relation Age of Onset   Hypertension Mother    Heart disease Mother    Anxiety disorder Mother    Breast cancer Mother 46   Heart disease Father        atrial fib   Hypertension Father    Atrial fibrillation Father    Obesity Father    Bladder Cancer Father 62   Lymphoma Father 78   Diverticulitis Brother    Ovarian cancer Maternal Aunt 30   Breast cancer Maternal Grandmother 65   Heart disease Maternal Grandfather        MI   Heart disease  Paternal Grandfather        MI   Leukemia Nephew 15       AML and ALL   Colon cancer Neg Hx    Colon polyps Neg Hx    Esophageal cancer Neg Hx    Rectal cancer Neg Hx    Stomach cancer Neg Hx      Social Connections: Moderately Isolated (07/26/2023)   Social Connection and Isolation Panel  Frequency of Communication with Friends and Family: More than three times a week    Frequency of Social Gatherings with Friends and Family: Twice a week    Attends Religious Services: Never    Database administrator or Organizations: No    Attends Engineer, structural: Not on file    Marital Status: Married      Current Outpatient Medications:    amLODipine  (NORVASC ) 2.5 MG tablet, Take 1 tablet (2.5 mg total) by mouth daily., Disp: 90 tablet, Rfl: 1   Cetirizine HCl (ZYRTEC PO), Take 1 tablet by mouth daily., Disp: , Rfl:    cholecalciferol (VITAMIN D3) 25 MCG (1000 UNIT) tablet, Take 1,000 Units by mouth daily., Disp: , Rfl:    cyclobenzaprine  (FLEXERIL ) 5 MG tablet, Take 1 tablet (5 mg total) by mouth 3 (three) times daily as needed for muscle spasms., Disp: 30 tablet, Rfl: 1   estradiol  (CLIMARA  - DOSED IN MG/24 HR) 0.0375 mg/24hr patch, APPLY 1 PATCH TOPICALLY ONCE A  WEEK, Disp: 4 patch, Rfl: 3   hydrochlorothiazide  (HYDRODIURIL ) 25 MG tablet, Take 0.5-1 tablets (12.5-25 mg total) by mouth daily., Disp: 90 tablet, Rfl: 1   lisinopril  (ZESTRIL ) 20 MG tablet, TAKE 1 TABLET BY MOUTH IN THE  MORNING AND AT BEDTIME, Disp: 180 tablet, Rfl: 3   MELATONIN PO, Take by mouth., Disp: , Rfl:    meloxicam  (MOBIC ) 15 MG tablet, Take 1 tablet (15 mg total) by mouth daily., Disp: 30 tablet, Rfl: 0   Misc. Devices (DENTAL GUARD) MISC, 1 Device by Does not apply route See admin instructions., Disp: 1 each, Rfl: 0   TURMERIC CURCUMIN PO, Take by mouth., Disp: , Rfl:    Physical Exam:   BP 120/76   Pulse 79   LMP 11/19/2010   SpO2 92%   Pertinent Findings  CN II-XII intact Bilateral EAC clear  and TM intact with well pneumatized middle ear spaces Weber 512: equal Rinne 512: AC > BC b/l  Anterior rhinoscopy: Septum midline; bilateral inferior turbinates with minimal hypertrophy No lesions of oral cavity/oropharynx; dentition within normal limits No obviously palpable neck masses/lymphadenopathy/thyromegaly No respiratory distress or stridor  Seprately Identifiable Procedures:  None  Impression & Plans:  [name] is a 53 y.o. female with the following   Headache-  53 year old female seen in our office for evaluation of headache.  I have low suspicion that this is related to her ear.  She describes this coming from her temple region.  Although she did take several rounds of antibiotics after she told that her eardrum was red I am uncertain if she had true otitis media.  Her symptoms are not consistent with eustachian tube dysfunction, I see no signs of infection on exam today, she is not having any pain.  She has no neurologic deficits.  I have recommended that she follow-up in the office if her pain returns so that I can visualize the ear to assure no infection.  I have also advised continued outpatient follow-up with primary care given the ongoing headache.  I am happy to see her back at any point in the future.  The patient verbalized understanding and agreement to today's plan.   - f/u PRN   Thank you for allowing me the opportunity to care for your patient. Please do not hesitate to contact me should you have any other questions.  Sincerely, Chyrl Cohen PA-C [facility] Phone: [phone] Fax: [phone]  09/15/2023, 9:29 AM

## 2023-09-15 NOTE — Progress Notes (Signed)
  [address] [place], [place] [address] [phone]  Audiological Evaluation    Name: [name]     DOB:   04-Jan-1971      MRN:   [id number]                                                                                     Service Date: 09/15/2023     Accompanied by: unaccompanied   Patient comes today after Reyes Cohen, PA-C sent a referral for a hearing evaluation due to concerns with ear pain.   Symptoms Yes Details  Hearing loss  []  Cannot hear an alarm at her house  Tinnitus  []  Maybe pressure/ocean sound in the right ear sometimes- difficult for the patient to describe  Ear pain/ infections/pressure  [x]  Right ear pain/pressure - comes and goes .   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 Used t have them as a teenager. Also has allergies and takes a zyrtec every day.Has a bite guard for TMJ disorder.    Otoscopy: Right ear: Clear external ear canal and notable landmarks visualized on the tympanic membrane. Left ear:  Clear external ear canal and notable landmarks visualized on the tympanic membrane.  Tympanometry: Right ear: Type A- Normal external ear canal volume with normal middle ear pressure and tympanic membrane compliance. Left ear: Type A- Normal external ear canal volume with normal middle ear pressure and tympanic membrane compliance.    Pure tone Audiometry: Both ears- Normal hearing from  250 Hz - 8000 Hz.  Speech Audiometry: Right ear- Speech Reception Threshold (SRT) was obtained at 10 dBHL. Left ear-Speech Reception Threshold (SRT) was obtained at 10 dBHL.   Word Recognition Score Tested using NU-6 (recorded) Right ear: 100% was obtained at a  presentation level of 50 dBHL with contralateral masking which is deemed as  excellent. Left ear: 100% was obtained at a presentation level of 50 dBHL with contralateral masking which is deemed as  excellent.   The hearing test results were completed under headphones and results are deemed to be of good reliability. Test technique:  conventional      Recommendations: Follow up with ENT as scheduled for today. Return for a hearing evaluation if concerns with hearing changes arise or per MD recommendation.   Brigitta Pricer MARIE LEROUX-MARTINEZ, AUD

## 2023-09-18 ENCOUNTER — Encounter: Payer: Self-pay | Admitting: Neurology

## 2023-09-18 NOTE — Addendum Note (Signed)
 Addended by: ALMARIE BIRMINGHAM B on: 09/18/2023 03:17 PM   Modules accepted: Orders

## 2023-09-25 ENCOUNTER — Encounter: Payer: Self-pay | Admitting: Audiology

## 2023-09-27 ENCOUNTER — Telehealth: Payer: Self-pay | Admitting: Neurology

## 2023-09-27 NOTE — Telephone Encounter (Signed)
 Can see any provider with openings.   Copied from CRM [id number]. Topic: Appointments - Scheduling Inquiry for Clinic >> Sep 27, 2023  3:14 PM [name] wrote: Reason for CRM: Patient has had on and off headaches since May and would like the soonest appt. She has seen Waddell Mon in the past and open to seeing her again but the soonest appt I see is 8/13 and she would like to know if she can come in sooner please. She has an appt with Neuro on 8/14.  Patient callback is [phone]

## 2023-09-29 ENCOUNTER — Encounter: Payer: Self-pay | Admitting: Student

## 2023-09-29 ENCOUNTER — Ambulatory Visit: Payer: Self-pay | Admitting: Student

## 2023-09-29 ENCOUNTER — Ambulatory Visit (INDEPENDENT_AMBULATORY_CARE_PROVIDER_SITE_OTHER): Admitting: Student

## 2023-09-29 ENCOUNTER — Ambulatory Visit (HOSPITAL_BASED_OUTPATIENT_CLINIC_OR_DEPARTMENT_OTHER)
Admission: RE | Admit: 2023-09-29 | Discharge: 2023-09-29 | Disposition: A | Discharge status: Home / Self Care | Source: Ambulatory Visit | Attending: Student | Admitting: Student

## 2023-09-29 VITALS — BP 108/62 | HR 78 | Temp 98.4°F | Resp 12 | Ht 64.0 in | Wt 234.6 lb

## 2023-09-29 DIAGNOSIS — I10 Essential (primary) hypertension: Secondary | ICD-10-CM | POA: Diagnosis not present

## 2023-09-29 DIAGNOSIS — G43719 Chronic migraine without aura, intractable, without status migrainosus: Secondary | ICD-10-CM | POA: Insufficient documentation

## 2023-09-29 MED ORDER — RIZATRIPTAN BENZOATE 5 MG PO TABS: 5.0000 mg | ORAL_TABLET | ORAL | 0 refills | Status: DC | PRN

## 2023-09-29 NOTE — Assessment & Plan Note (Addendum)
 Rx-Maxalt.  CT head related to >55yo with new onset migraine. Encourage adequate hydration. Follow with neurology, upcoming appointment 8/14. Consider prophylactic treatment if no improvements. Return to clinic for persistent or worsening symptoms.

## 2023-09-29 NOTE — Progress Notes (Signed)
 Subjective:     Patient ID: [name], female    DOB: 09/13/1970, 53 y.o.   MRN: [id number]  Chief Complaint  Patient presents with   Headache    Started mid may and has not stopped    HPI  Presents for acute visit for constant  headaches/migraines since May.  This is new onset headache.  PMHx HTN, TMJ, though reports this is well-controlled.  History of hypertension, blood pressure has been well-controlled.  Has tried NSAIDs and Tylenol  with minimal relief.  Was given Flexeril  Rx in May, felt this also was minimal relief.  She was treated for ear infection during this time, felt she had a 5-day period after this treatment that migraine improved, but then returned and have been fairly constant daily.  Today, she has minimal headache 2/10 pain.   Headache: Onset: Since May  Location: right side- temple, feels it goes down to jaw  Frequency: all day, every day - sometimes wakes up with it- lasting days Precipitating factors:   Prior treatment: flexeril ,  Moderate pain   Associated Symptoms: Nausea/vomiting: no  Photophobia/phonophobia: no  Tearing of eyes: no  Sinus pain/pressure: no  Family hx migraine: yes  Personal stressors: no  Relation to menstrual cycle: no   Red Flags: Fever: no  Neck pain/stiffness: no  Vision/speech/swallow/hearing difficulty: no  Focal weakness/numbness: no  Altered mental status: no  Trauma: no  New type of headache: yes  Anticoagulant use: no  H/o cancer/HIV/Pregnancy: no    Patient reports that her daughter has a history of migraines and has been on long-term abortive and prophylactic medicine.  She is seeing neurology and has upcoming appointment with them.  Patient denies fever, chills, SOB, CP, palpitations, dyspnea, edema,vision changes, N/V/D, abdominal pain, urinary symptoms, rash, weight changes, and recent illness or hospitalizations.    History of Present Illness              Health Maintenance Due  Topic Date  Due   Hepatitis B Vaccines (1 of 3 - 19+ 3-dose series) Never done    Past Medical History:  Diagnosis Date   Acute mastitis of left breast 10/04/2007   Allergy    seasonal allergies   AMA (advanced maternal age) multigravida 35+    Cancer (HCC) 2002   Left leg melanoma   Celiac disease    Chronic back pain    H/O   Complication of anesthesia    Difficult time waking up   Constipation in pregnancy 03/2006   Endometriosis    h/o   Environmental allergies    Family history of breast cancer    Family history of ovarian cancer    Fibroid    During first pregnancy   Fibromyalgia    pt denies dx   First trimester bleeding 2008   GBS carrier    GERD (gastroesophageal reflux disease)    with certain foods-OTC PRN   Granuloma annulare    H/O amenorrhea 10/2006   H/O blood clots    7 yr ago in arm   H/O varicella    As an infant.   Headache(784.0)    Hearing loss 08/15/2016   Heart murmur    heard occassional at MD office   History of melanoma    HTN (hypertension)    on meds   Hyperlipidemia 03/02/2017   diet controlled   IBS (irritable bowel syndrome) 08/15/2016   Irregular uterine bleeding    Joint pain    Lactose  intolerance    Leg edema    Leg pain, left 02/09/2016   Monilial vaginitis 07/27/2004   Palpitations    Rh negative status during pregnancy    SAB (spontaneous abortion)    h/o x 2   Seasonal allergies    Sleep apnea 08/15/2016   no CPAP use- uses nightguard   TMJ syndrome    Vegetarianism 08/15/2016   Vitamin D  deficiency 08/16/2015    Past Surgical History:  Procedure Laterality Date   ABDOMINAL HYSTERECTOMY  03/01/2011   APPENDECTOMY  02/29/2000   BREAST REDUCTION SURGERY Bilateral 11/24/2021   Procedure: BREAST REDUCTION WITH LIPOSUCTION;  Surgeon: Lowery Estefana RAMAN, DO;  Location: [facility];  Service: Plastics;  Laterality: Bilateral;   DILATION AND EVACUATION     endocervical polyp removal  03/01/1999   ENDOMETRIAL  ABLATION  02/29/2008   HERNIA REPAIR  05/16/2012   incisional hernia repair   HYSTEROSCOPY W/ ENDOMETRIAL ABLATION  01/28/2009   INSERTION OF MESH N/A 05/16/2012   Procedure: INSERTION OF MESH;  Surgeon: Debby LABOR. Cornett, MD;  Location: [facility];  Service: General;  Laterality: N/A;   KNEE ARTHROSCOPY Left 12/05/2012   Procedure: LEFT KNEE ARTHROSCOPY;  Surgeon: Dempsey JINNY Sensor, MD;  Location: [facility];  Service: Orthopedics;  Laterality: Left;   MELANOMA EXCISION  02/29/2000   Left Leg   melanoma removal   02/28/2001   REDUCTION MAMMAPLASTY Bilateral    10/2021   REPLACEMENT TOTAL KNEE BILATERAL Bilateral    SALPINGOOPHORECTOMY  12/08/2011   Procedure: SALPINGO OOPHERECTOMY;  Surgeon: Shanda SHAUNNA Muscat, MD;  Location: [facility];  Service: Gynecology;  Laterality: Bilateral;   TENDON RELEASE  02/28/2002   uterine curettage  01/28/2009   VENTRAL HERNIA REPAIR N/A 05/16/2012   Procedure: LAPAROSCOPIC VENTRAL HERNIA;  Surgeon: Debby A. Cornett, MD;  Location: [facility];  Service: General;  Laterality: N/A;   WISDOM TOOTH EXTRACTION  03/01/1987    Family History  Problem Relation Age of Onset   Hypertension Mother    Heart disease Mother    Anxiety disorder Mother    Breast cancer Mother 31   Heart disease Father        atrial fib   Hypertension Father    Atrial fibrillation Father    Obesity Father    Bladder Cancer Father 54   Lymphoma Father 53   Diverticulitis Brother    Ovarian cancer Maternal Aunt 50   Breast cancer Maternal Grandmother 79   Heart disease Maternal Grandfather        MI   Heart disease Paternal Grandfather        MI   Leukemia Nephew 15       AML and ALL   Colon cancer Neg Hx    Colon polyps Neg Hx    Esophageal cancer Neg Hx    Rectal cancer Neg Hx    Stomach cancer Neg Hx     Social History   Socioeconomic History   Marital status: Married    Spouse name: Mining engineer   Number of children: 3   Years of education: Not on file    Highest education level: Associate degree: academic program  Occupational History   Occupation: Psychologist, sport and exercise  Tobacco Use   Smoking status: Former    Current packs/day: 0.00    Types: Cigarettes    Quit date: 04/26/2020    Years since quitting: 3.4   Smokeless tobacco: Never   Tobacco comments:  1-3 cigarrettes per week  Vaping Use   Vaping status: Never Used  Substance and Sexual Activity   Alcohol use: No   Drug use: No   Sexual activity: Yes    Birth control/protection: Surgical    Comment: BTL  Other Topics Concern   Not on file  Social History Narrative   ** Merged History Encounter **       Lives with husband and 3 children 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 Wears seat belt   Social Drivers of Health   Financial Resource Strain: Low Risk  (07/26/2023)   Overall Financial Resource Strain (CARDIA)    Difficulty of Paying Living Expenses: Not hard at all  Food Insecurity: No Food Insecurity (07/26/2023)   Hunger Vital Sign    Worried About Running Out of Food in the Last Year: Never true    Ran Out of Food in the Last Year: Never true  Transportation Needs: No Transportation Needs (07/26/2023)   PRAPARE - Administrator, Civil Service (Medical): No    Lack of Transportation (Non-Medical): No  Physical Activity: Insufficiently Active (07/26/2023)   Exercise Vital Sign    Days of Exercise per Week: 3 days    Minutes of Exercise per Session: 10 min  Stress: Stress Concern Present (07/26/2023)   [employer] of Occupational Health - Occupational Stress Questionnaire    Feeling of Stress : To some extent  Social Connections: Moderately Isolated (07/26/2023)   Social Connection and Isolation Panel    Frequency of Communication with Friends and Family: More than three times a week    Frequency of Social Gatherings with Friends and Family: Twice a week    Attends Religious Services: Never    Database administrator or Organizations: No    Attends  Engineer, structural: Not on file    Marital Status: Married  Catering manager Violence: Not on file    Outpatient Medications Prior to Visit  Medication Sig Dispense Refill   amLODipine  (NORVASC ) 2.5 MG tablet Take 1 tablet (2.5 mg total) by mouth daily. 90 tablet 1   Cetirizine HCl (ZYRTEC PO) Take 1 tablet by mouth daily.     cholecalciferol (VITAMIN D3) 25 MCG (1000 UNIT) tablet Take 1,000 Units by mouth daily.     cyclobenzaprine  (FLEXERIL ) 5 MG tablet Take 1 tablet (5 mg total) by mouth 3 (three) times daily as needed for muscle spasms. 30 tablet 1   estradiol  (CLIMARA  - DOSED IN MG/24 HR) 0.0375 mg/24hr patch APPLY 1 PATCH TOPICALLY ONCE A  WEEK 4 patch 3   hydrochlorothiazide  (HYDRODIURIL ) 25 MG tablet Take 0.5-1 tablets (12.5-25 mg total) by mouth daily. 90 tablet 1   lisinopril  (ZESTRIL ) 20 MG tablet TAKE 1 TABLET BY MOUTH IN THE  MORNING AND AT BEDTIME 180 tablet 3   MELATONIN PO Take by mouth.     meloxicam  (MOBIC ) 15 MG tablet Take 1 tablet (15 mg total) by mouth daily. 30 tablet 0   Misc. Devices (DENTAL GUARD) MISC 1 Device by Does not apply route See admin instructions. 1 each 0   TURMERIC CURCUMIN PO Take by mouth.     No facility-administered medications prior to visit.    Allergies  Allergen Reactions   Cymbalta  [Duloxetine  Hcl] Other (See Comments)    Makes her feel crazy, out of body   Betadine [Povidone Iodine] Other (See Comments)    Dx with allergy testing   Iodine    Shellfish Allergy Other (See  Comments)    Dx with allergy testing   Prednisone  Rash    Flushing    ROS See HPI    Objective:    Physical Exam  General: No acute distress. Awake and conversant.  Eyes: Normal conjunctiva, anicteric. Round symmetric pupils.  Respiratory: CTAB. Respirations are non-labored. No wheezing.  Skin: Warm. No rashes or ulcers.  Psych: Alert and oriented. Cooperative, Appropriate mood and affect, Normal judgment.  CV: RRR. No murmur. No lower extremity  edema.  MSK: Normal ambulation. Full ROM UE and LE. Neuro:  CN II-XII grossly normal.     BP 108/62 (BP Location: Right Arm, Patient Position: Sitting, Cuff Size: Large)   Pulse 78   Temp 98.4 F (36.9 C) (Oral)   Resp 12   Ht 5' 4 (1.626 m)   Wt 234 lb 9.6 oz (106.4 kg)   LMP 11/19/2010   SpO2 94%   BMI 40.27 kg/m  Wt Readings from Last 3 Encounters:  09/29/23 234 lb 9.6 oz (106.4 kg)  07/26/23 235 lb (106.6 kg)  06/15/23 231 lb 3.2 oz (104.9 kg)       Assessment & Plan:   Problem List Items Addressed This Visit     HTN (hypertension)   Well controlled, no changes to meds. Encouraged heart healthy diet such as the DASH diet and exercise as tolerated.        Intractable chronic migraine without aura and without status migrainosus - Primary   Rx-Maxalt.  CT head related to >50yo with new onset migraine. Encourage adequate hydration. Follow with neurology, upcoming appointment 8/14. Consider prophylactic treatment if no improvements. Return to clinic for persistent or worsening symptoms.        Relevant Medications   rizatriptan (MAXALT) 5 MG tablet   Other Relevant Orders   CT HEAD WO CONTRAST ( )   Portions of this note were dictated using DRAGON voice recognition software. Please disregard any errors in transcription.    I have discontinued Natalie A. Stannard's TURMERIC CURCUMIN PO. I am also having her start on rizatriptan. Additionally, I am having her maintain her Dental Guard, Cetirizine HCl (ZYRTEC PO), cholecalciferol, MELATONIN PO, estradiol , lisinopril , cyclobenzaprine , meloxicam , amLODipine , and hydrochlorothiazide .  Meds ordered this encounter  Medications   rizatriptan (MAXALT) 5 MG tablet    Sig: Take 1 tablet (5 mg total) by mouth as needed for migraine. May repeat in 2 hours if needed.  Do not exceed 20 mg in a 24-hour period    Dispense:  10 tablet    Refill:  0    Supervising Provider:   DOMENICA BLACKBIRD A [[id number]]

## 2023-09-29 NOTE — Assessment & Plan Note (Signed)
 Well controlled, no changes to meds. Encouraged heart healthy diet such as the DASH diet and exercise as tolerated.

## 2023-10-02 NOTE — Progress Notes (Signed)
 Initial neurology clinic note  [name] MRN: [id number] DOB: 1971-01-15  Referring provider: Almarie Waddell NOVAK, NP  Primary care provider: Domenica Harlene DELENA, MD  Reason for consult:  headache  Subjective:  This is [name], a 53 y.o. right-handed female with a medical history of HTN, TMJ disorder, bilateral knee replacement who presents to neurology clinic with headache. The patient is alone today.  Patient had a lot of headaches as a young adult. She would have severe headaches and vomit, but never saw anyone. She would take a lot of excedrin. She felt she grew out of these when out of her 55s. She would rarely get headaches since, maybe once a month that would respond to ibuprofen .  In the beginning of 06/2023, she started having right temple pain, pulsating pain. She could press her temple and make it a little better. She could function, but the pain could be 7/10. The frequency is what concerned the patient. The headache could last days. She initially having 5 days of headache per week. Taking ibuprofen , tylenol , or Sudafed could lower pain to 1/10. She spoke with PCP who wondered if symptoms could be due to TMJ. She has worn a bite guard for TMJ and OSA. She sees a specialist for this. Her current guard is old and may need replacement soon. She was given flexeril  to take at night. It may have helped a little. Symptoms were so bad in 07/2023 that she went to urgent care. There was concern for an ear infection. She was put on antibiotics and felt a little better that week. She went on vacation in 08/2023 and pain was so bad so went to another urgent care. They also felt ear looked inflamed and put patient on my antibiotics. This did not help. PCP then sent patient to ENT who felt ears looked fine and that she never had an infection. PCP prescribed rizatriptan  on 09/29/23. She took it once and did not feel it helped. She found it did not help. She took one of her duaghter's Nurtec and  found it did help.  CT head was normal. More recently, over the last couple of weeks, the frequency as decreased to 1-2 headaches per week last around 6 hours. The most recent headache did respond to ibuprofen . She will take OTC medications anywhere from multiple times per day to a couple of times per week. She tries to wait it out before taking medications.  She denies photophobia, phonophobia, or nausea. She denies rhinorrhea or tearing. She denies vision loss though can have blurry vision. She will occasionally have vertigo when laying down that passes quickly (always spinning to the right). Position does not affect headaches. She denies any significant neck pain.  Smoker: No OCP use: Estrogen patch Caffiene use: 2 cups of coffee in the morning, sometimes a soda in the afternoon EtOH use: none Restrictive diet: Vegetarian Family history of neurologic disease including headaches: daughter has migraines   MEDICATIONS:  Outpatient Encounter Medications as of 10/12/2023  Medication Sig   amLODipine  (NORVASC ) 2.5 MG tablet Take 1 tablet (2.5 mg total) by mouth daily.   Cetirizine HCl (ZYRTEC PO) Take 1 tablet by mouth daily.   cholecalciferol (VITAMIN D3) 25 MCG (1000 UNIT) tablet Take 1,000 Units by mouth daily.   cyclobenzaprine  (FLEXERIL ) 5 MG tablet Take 1 tablet (5 mg total) by mouth 3 (three) times daily as needed for muscle spasms.   estradiol  (CLIMARA  - DOSED IN MG/24 HR) 0.0375 mg/24hr  patch APPLY 1 PATCH TOPICALLY ONCE A  WEEK   hydrochlorothiazide  (HYDRODIURIL ) 25 MG tablet Take 0.5-1 tablets (12.5-25 mg total) by mouth daily.   lisinopril  (ZESTRIL ) 20 MG tablet TAKE 1 TABLET BY MOUTH IN THE  MORNING AND AT BEDTIME   MELATONIN PO Take by mouth.   meloxicam  (MOBIC ) 15 MG tablet Take 1 tablet (15 mg total) by mouth daily.   Misc. Devices (DENTAL GUARD) MISC 1 Device by Does not apply route See admin instructions.   rizatriptan  (MAXALT ) 5 MG tablet Take 1 tablet (5 mg total) by mouth  as needed for migraine. May repeat in 2 hours if needed.  Do not exceed 20 mg in a 24-hour period   No facility-administered encounter medications on file as of 10/12/2023.    PAST MEDICAL HISTORY: Past Medical History:  Diagnosis Date   Acute mastitis of left breast 10/04/2007   Allergy    seasonal allergies   AMA (advanced maternal age) multigravida 35+    Cancer (HCC) 2002   Left leg melanoma   Celiac disease    Chronic back pain    H/O   Complication of anesthesia    Difficult time waking up   Constipation in pregnancy 03/2006   Endometriosis    h/o   Environmental allergies    Family history of breast cancer    Family history of ovarian cancer    Fibroid    During first pregnancy   Fibromyalgia    pt denies dx   First trimester bleeding 2008   GBS carrier    GERD (gastroesophageal reflux disease)    with certain foods-OTC PRN   Granuloma annulare    H/O amenorrhea 10/2006   H/O blood clots    7 yr ago in arm   H/O varicella    As an infant.   Headache(784.0)    Hearing loss 08/15/2016   Heart murmur    heard occassional at MD office   History of melanoma    HTN (hypertension)    on meds   Hyperlipidemia 03/02/2017   diet controlled   IBS (irritable bowel syndrome) 08/15/2016   Irregular uterine bleeding    Joint pain    Lactose intolerance    Leg edema    Leg pain, left 02/09/2016   Monilial vaginitis 07/27/2004   Palpitations    Rh negative status during pregnancy    SAB (spontaneous abortion)    h/o x 2   Seasonal allergies    Sleep apnea 08/15/2016   no CPAP use- uses nightguard   TMJ syndrome    Vegetarianism 08/15/2016   Vitamin D  deficiency 08/16/2015    PAST SURGICAL HISTORY: Past Surgical History:  Procedure Laterality Date   ABDOMINAL HYSTERECTOMY  03/01/2011   APPENDECTOMY  02/29/2000   BREAST REDUCTION SURGERY Bilateral 11/24/2021   Procedure: BREAST REDUCTION WITH LIPOSUCTION;  Surgeon: Lowery Estefana RAMAN, DO;  Location: [facility]  ;  Service: Plastics;  Laterality: Bilateral;   DILATION AND EVACUATION     endocervical polyp removal  03/01/1999   ENDOMETRIAL ABLATION  02/29/2008   HERNIA REPAIR  05/16/2012   incisional hernia repair   HYSTEROSCOPY W/ ENDOMETRIAL ABLATION  01/28/2009   INSERTION OF MESH N/A 05/16/2012   Procedure: INSERTION OF MESH;  Surgeon: Debby LABOR. Cornett, MD;  Location: [facility];  Service: General;  Laterality: N/A;   KNEE ARTHROSCOPY Left 12/05/2012   Procedure: LEFT KNEE ARTHROSCOPY;  Surgeon: Dempsey JINNY Sensor, MD;  Location: [facility];  Service: Orthopedics;  Laterality: Left;   MELANOMA EXCISION  02/29/2000   Left Leg   melanoma removal   02/28/2001   REDUCTION MAMMAPLASTY Bilateral    10/2021   REPLACEMENT TOTAL KNEE BILATERAL Bilateral    SALPINGOOPHORECTOMY  12/08/2011   Procedure: SALPINGO OOPHERECTOMY;  Surgeon: Shanda SHAUNNA Muscat, MD;  Location: [facility];  Service: Gynecology;  Laterality: Bilateral;   TENDON RELEASE  02/28/2002   uterine curettage  01/28/2009   VENTRAL HERNIA REPAIR N/A 05/16/2012   Procedure: LAPAROSCOPIC VENTRAL HERNIA;  Surgeon: Debby A. Cornett, MD;  Location: [facility];  Service: General;  Laterality: N/A;   WISDOM TOOTH EXTRACTION  03/01/1987    ALLERGIES: Allergies  Allergen Reactions   Cymbalta  [Duloxetine  Hcl] Other (See Comments)    Makes her feel crazy, out of body   Betadine [Povidone Iodine] Other (See Comments)    Dx with allergy testing   Iodine    Shellfish Allergy Other (See Comments)    Dx with allergy testing   Prednisone  Rash    Flushing    FAMILY HISTORY: Family History  Problem Relation Age of Onset   Hypertension Mother    Heart disease Mother    Anxiety disorder Mother    Breast cancer Mother 51   Heart disease Father        atrial fib   Hypertension Father    Atrial fibrillation Father    Obesity Father    Bladder Cancer Father 76   Lymphoma Father 41   Diverticulitis Brother    Ovarian cancer  Maternal Aunt 50   Breast cancer Maternal Grandmother 51   Heart disease Maternal Grandfather        MI   Heart disease Paternal Grandfather        MI   Leukemia Nephew 15       AML and ALL   Colon cancer Neg Hx    Colon polyps Neg Hx    Esophageal cancer Neg Hx    Rectal cancer Neg Hx    Stomach cancer Neg Hx     SOCIAL HISTORY: Social History   Tobacco Use   Smoking status: Former    Current packs/day: 0.00    Types: Cigarettes    Quit date: 04/26/2020    Years since quitting: 3.4   Smokeless tobacco: Never   Tobacco comments:    1-3 cigarrettes per week  Vaping Use   Vaping status: Never Used  Substance Use Topics   Alcohol use: No   Drug use: No   Social History   Social History Narrative   ** Merged History Encounter ** Lives with husband and 3 children   Owns a [employer]   Vegetarian   Wears seat belt   Right handed    Objective:  Vital Signs:  BP 111/73 (BP Location: Left Arm, Patient Position: Sitting)   Pulse 78   Ht 5' 4 (1.626 m)   Wt 236 lb (107 kg)   LMP 11/19/2010   SpO2 96%   BMI 40.51 kg/m   General: No acute distress.  Patient appears well-groomed.   Head:  Normocephalic/atraumatic Eyes:  fundi examined, disc margins clear, no obvious papilledema Neck: supple, no paraspinal tenderness, full range of motion Heart: regular rate and rhythm Lungs: Clear to auscultation bilaterally. Vascular: No carotid bruits.  Neurological Exam: Mental status: alert and oriented, speech fluent and not dysarthric, language intact.  Cranial nerves: CN I: not tested CN II: pupils equal, round and reactive  to light, visual fields intact CN III, IV, VI:  full range of motion, no nystagmus, no ptosis CN V: facial sensation intact. CN VII: upper and lower face symmetric CN VIII: hearing intact CN IX, X: uvula midline CN XI: sternocleidomastoid and trapezius muscles intact CN XII: tongue midline  Bulk & Tone: normal, no  fasciculations. Motor:  muscle strength 5/5 throughout Deep Tendon Reflexes:  2+ throughout.   Sensation:  Pinprick sensation intact. Finger to nose testing:  Without dysmetria.   Gait:  Normal station and stride.  Romberg negative.   Labs and Imaging review: Internal labs: 06/15/23: CBC w/ diff unremarkable CMP unremarkable Lipid panel: tChol 182, LDL 103, TG 162 TSH wnl HbA1c: 5.9 Vit D wnl  Imaging/Procedures: CT head wo contrast (09/29/23): FINDINGS: Brain: Cerebral volume within normal limits for age. No midline shift, ventriculomegaly, mass effect, evidence of mass lesion, intracranial hemorrhage or evidence of cortically based acute infarction. Gray-white differentiation within normal limits for age. Normal basilar cisterns.   Vascular: No suspicious intracranial vascular hyperdensity. Distal left vertebral artery appears dominant, normal variant.   Skull: No acute osseous abnormality identified.   Sinuses/Orbits: Visualized paranasal sinuses and mastoids are clear.   Other: Visualized orbits and scalp soft tissues are within normal limits.   IMPRESSION: Normal for age noncontrast Head CT.  Assessment/Plan:  [name] is a 53 y.o. female who presents for evaluation of headaches/right face pain. She has a relevant medical history of HTN, TMJ disorder, bilateral knee replacement. Her neurological examination is essentially normal today. Available diagnostic data is significant for normal TSH and normal CT head. It sounds like patient has a remote history of migraines and her daughter has migraines, so return of migraines is a possible etiology. Her symptoms do not have all the classic features of migraine other than being one-sided though. She has a history of TMJ, perhaps worse on the right side, so this could be a cause. Medication overuse is also likely contributing. Given the frequency of headaches, I favor starting patient on a preventative medication.  Nortriptyline  can also treat atypical face pain, so this is likely a good option. She did not tolerate or respond to rizatriptan , so I will switch her to Sumatriptan  to see if this helps. Next option would be Nurtec as she responded to her daughter's medication.  PLAN: -Blood work: ESR, CRP, B12 -For headaches: Headache prevention:  Will start nortriptyline  10 mg at bedtime for 1 week, then increase to 20 mg qhs Headache rescue: Stop rizatriptan . Start sumatriptan  100 mg as needed at headache onset, can repeat after 2 hours if needed Limit use of pain relievers to no more than 2 days out of week to prevent risk of rebound or medication-overuse headache. Keep headache diary  -Return to clinic in 3 months  The impression above as well as the plan as outlined below were extensively discussed with the patient who voiced understanding. All questions were answered to their satisfaction.  When available, results of the above investigations and possible further recommendations will be communicated to the patient via telephone/MyChart. Patient to call office if not contacted after expected testing turnaround time.   Total time spent reviewing records, interview, history/exam, documentation, and coordination of care on day of encounter:  70 min   Thank you for allowing me to participate in patient's care.  If I can answer any additional questions, I would be pleased to do so.  Venetia Potters, MD   CC: Domenica Harlene DELENA, MD [phone] [place] [address]  CC: Referring provider: Almarie Waddell NOVAK, NP [address] [place],  [place] [address]

## 2023-10-12 ENCOUNTER — Ambulatory Visit: Admitting: Neurology

## 2023-10-12 ENCOUNTER — Encounter: Payer: Self-pay | Admitting: Neurology

## 2023-10-12 ENCOUNTER — Other Ambulatory Visit

## 2023-10-12 VITALS — BP 111/73 | HR 78 | Ht 64.0 in | Wt 236.0 lb

## 2023-10-12 DIAGNOSIS — R519 Headache, unspecified: Secondary | ICD-10-CM

## 2023-10-12 DIAGNOSIS — S0300XS Dislocation of jaw, unspecified side, sequela: Secondary | ICD-10-CM

## 2023-10-12 MED ORDER — SUMATRIPTAN SUCCINATE 100 MG PO TABS: 100.0000 mg | ORAL_TABLET | Freq: Once | ORAL | 5 refills | Status: AC | PRN

## 2023-10-12 MED ORDER — NORTRIPTYLINE HCL 10 MG PO CAPS: 20.0000 mg | ORAL_CAPSULE | Freq: Every day | ORAL | 5 refills | Status: AC

## 2023-10-12 NOTE — Patient Instructions (Addendum)
 I saw you today for headaches. The cause is unclear. You seem to have a history of migraines, but your headaches do not have all the typical migraine features. This could have contributions from TMJ and too much over the counter medications like ibuprofen  (medication overuse or rebound headaches).  I would like to get some labs today. I will be in touch when I have those results.  For your headaches:  Headache prevention:  Will start nortriptyline  10 mg at bedtime for 1 week, then increase to 20 mg qhs Headache rescue: Stop rizatriptan . Start sumatriptan  100 mg as needed at headache onset, can repeat after 2 hours if needed Limit use of pain relievers to no more than 2 days out of week to prevent risk of rebound or medication-overuse headache. Keep headache diary  -Return to clinic in 3 months  Please let me know if you have any questions or concerns in the meantime.   The physicians and staff at [facility] are committed to providing excellent care. You may receive a survey requesting feedback about your experience at our office. We strive to receive very good responses to the survey questions. If you feel that your experience would prevent you from giving the office a very good  response, please contact our office to try to remedy the situation. We may be reached at [phone]. Thank you for taking the time out of your busy day to complete the survey.  Venetia Potters, MD [facility]  More migraine information (in the case your headaches are migraines): Be aware of common food triggers:  - Caffeine:  coffee, black tea, cola, Mt. Dew  - Chocolate  - Dairy:  aged cheeses (brie, blue, cheddar, gouda, Parmasan, provolone, romano, Swiss, etc), chocolate milk, buttermilk, sour cream, limit eggs and yogurt  - Nuts, peanut butter  - Alcohol  - Cereals/grains:  FRESH breads (fresh bagels, sourdough, doughnuts), yeast productions  - Processed/canned/aged/cured meats (pre-packaged  deli meats, hotdogs)  - MSG/glutamate:  soy sauce, flavor enhancer, pickled/preserved/marinated foods  - Sweeteners:  aspartame (Equal, Nutrasweet).  Sugar and Splenda are okay  - Vegetables:  legumes (lima beans, lentils, snow peas, fava beans, pinto peans, peas, garbanzo beans), sauerkraut, onions, olives, pickles  - Fruit:  avocados, bananas, citrus fruit (orange, lemon, grapefruit), mango  - Other:  Frozen meals, macaroni and cheese Routine exercise Stay adequately hydrated (aim for 64 oz water daily) Keep headache diary Maintain proper stress management Maintain proper sleep hygiene Do not skip meals Consider supplements:  magnesium citrate 400mg  daily, riboflavin 400mg  daily, coenzyme Q10 100mg  three times daily.

## 2023-10-13 ENCOUNTER — Ambulatory Visit: Payer: Self-pay | Admitting: Neurology

## 2023-10-13 LAB — VITAMIN B12: Vitamin B-12: 368 pg/mL (ref 200–1100)

## 2023-10-13 LAB — C-REACTIVE PROTEIN: CRP: 14.5 mg/L — ABNORMAL HIGH (ref <8.0)

## 2023-10-13 LAB — SEDIMENTATION RATE: Sed Rate: 6 mm/h (ref 0–30)

## 2023-12-01 ENCOUNTER — Ambulatory Visit: Admitting: Neurology

## 2024-01-10 ENCOUNTER — Ambulatory Visit: Admitting: Neurology

## 2024-01-18 ENCOUNTER — Encounter: Payer: Self-pay | Admitting: Family Medicine

## 2024-02-20 ENCOUNTER — Other Ambulatory Visit: Payer: Self-pay | Admitting: Family Medicine

## 2024-02-20 DIAGNOSIS — I10 Essential (primary) hypertension: Secondary | ICD-10-CM

## 2024-03-08 ENCOUNTER — Encounter: Payer: Self-pay | Admitting: Family Medicine

## 2024-03-08 MED ORDER — ESTRADIOL 0.0375 MG/24HR TD PTWK: MEDICATED_PATCH | TRANSDERMAL | 2 refills | Status: AC

## 2024-06-20 ENCOUNTER — Encounter: Admitting: Family Medicine

## 2024-08-13 ENCOUNTER — Encounter: Admitting: Family Medicine
